# Patient Record
Sex: Female | Born: 1955
Health system: Southern US, Community
[De-identification: ages and names within clinical notes are randomized; demographics above are authoritative.]

## PROBLEM LIST (undated history)

## (undated) DIAGNOSIS — M069 Rheumatoid arthritis, unspecified: Secondary | ICD-10-CM

## (undated) DIAGNOSIS — I480 Paroxysmal atrial fibrillation: Secondary | ICD-10-CM

## (undated) DIAGNOSIS — Z8489 Family history of other specified conditions: Secondary | ICD-10-CM

## (undated) DIAGNOSIS — D573 Sickle-cell trait: Secondary | ICD-10-CM

## (undated) DIAGNOSIS — Z5189 Encounter for other specified aftercare: Secondary | ICD-10-CM

## (undated) DIAGNOSIS — M5126 Other intervertebral disc displacement, lumbar region: Secondary | ICD-10-CM

## (undated) DIAGNOSIS — I499 Cardiac arrhythmia, unspecified: Secondary | ICD-10-CM

## (undated) DIAGNOSIS — J020 Streptococcal pharyngitis: Secondary | ICD-10-CM

## (undated) DIAGNOSIS — I2699 Other pulmonary embolism without acute cor pulmonale: Secondary | ICD-10-CM

## (undated) DIAGNOSIS — I493 Ventricular premature depolarization: Secondary | ICD-10-CM

## (undated) DIAGNOSIS — R011 Cardiac murmur, unspecified: Secondary | ICD-10-CM

## (undated) DIAGNOSIS — I10 Essential (primary) hypertension: Secondary | ICD-10-CM

## (undated) DIAGNOSIS — N939 Abnormal uterine and vaginal bleeding, unspecified: Secondary | ICD-10-CM

## (undated) DIAGNOSIS — R3915 Urgency of urination: Secondary | ICD-10-CM

## (undated) DIAGNOSIS — C801 Malignant (primary) neoplasm, unspecified: Secondary | ICD-10-CM

## (undated) DIAGNOSIS — R112 Nausea with vomiting, unspecified: Secondary | ICD-10-CM

## (undated) DIAGNOSIS — J189 Pneumonia, unspecified organism: Secondary | ICD-10-CM

## (undated) DIAGNOSIS — R0981 Nasal congestion: Secondary | ICD-10-CM

## (undated) DIAGNOSIS — I82409 Acute embolism and thrombosis of unspecified deep veins of unspecified lower extremity: Secondary | ICD-10-CM

## (undated) DIAGNOSIS — R0789 Other chest pain: Secondary | ICD-10-CM

## (undated) DIAGNOSIS — Z9889 Other specified postprocedural states: Secondary | ICD-10-CM

## (undated) DIAGNOSIS — G629 Polyneuropathy, unspecified: Secondary | ICD-10-CM

## (undated) HISTORY — DX: Polyneuropathy, unspecified: G62.9

## (undated) HISTORY — DX: Rheumatoid arthritis, unspecified: M06.9

## (undated) HISTORY — DX: Streptococcal pharyngitis: J02.0

## (undated) HISTORY — DX: Ventricular premature depolarization: I49.3

## (undated) HISTORY — PX: TONSILLECTOMY: SUR1361

## (undated) HISTORY — DX: Acute embolism and thrombosis of unspecified deep veins of unspecified lower extremity: I82.409

## (undated) HISTORY — DX: Other pulmonary embolism without acute cor pulmonale: I26.99

## (undated) HISTORY — PX: VASCULAR SURGERY: SHX849

## (undated) HISTORY — DX: Cardiac murmur, unspecified: R01.1

## (undated) HISTORY — DX: Encounter for other specified aftercare: Z51.89

## (undated) HISTORY — PX: MANDIBLE SURGERY: SHX707

## (undated) HISTORY — DX: Other chest pain: R07.89

## (undated) HISTORY — DX: Paroxysmal atrial fibrillation: I48.0

## (undated) HISTORY — PX: COLONOSCOPY W/ POLYPECTOMY: SHX1380

## (undated) HISTORY — DX: Essential (primary) hypertension: I10

## (undated) HISTORY — PX: SPINE SURGERY: SHX786

## (undated) HISTORY — DX: Abnormal uterine and vaginal bleeding, unspecified: N93.9

---

## 1998-04-25 ENCOUNTER — Ambulatory Visit (HOSPITAL_COMMUNITY): Admission: RE | Admit: 1998-04-25 | Discharge: 1998-04-25 | Payer: Self-pay | Admitting: *Deleted

## 1999-05-20 ENCOUNTER — Encounter: Payer: Self-pay | Admitting: *Deleted

## 1999-05-20 ENCOUNTER — Ambulatory Visit (HOSPITAL_COMMUNITY): Admission: RE | Admit: 1999-05-20 | Discharge: 1999-05-20 | Payer: Self-pay | Admitting: Neurosurgery

## 1999-05-20 ENCOUNTER — Encounter: Payer: Self-pay | Admitting: Neurosurgery

## 1999-09-21 ENCOUNTER — Inpatient Hospital Stay (HOSPITAL_COMMUNITY): Admission: EM | Admit: 1999-09-21 | Discharge: 1999-09-22 | Payer: Self-pay | Admitting: Emergency Medicine

## 1999-09-22 ENCOUNTER — Encounter: Payer: Self-pay | Admitting: Internal Medicine

## 1999-12-28 ENCOUNTER — Ambulatory Visit (HOSPITAL_COMMUNITY): Admission: AD | Admit: 1999-12-28 | Discharge: 1999-12-28 | Payer: Self-pay | Admitting: Neurosurgery

## 2000-05-06 ENCOUNTER — Ambulatory Visit (HOSPITAL_COMMUNITY): Admission: RE | Admit: 2000-05-06 | Discharge: 2000-05-06 | Payer: Self-pay | Admitting: Obstetrics

## 2000-05-06 ENCOUNTER — Encounter: Payer: Self-pay | Admitting: Obstetrics

## 2000-05-21 ENCOUNTER — Inpatient Hospital Stay (HOSPITAL_COMMUNITY): Admission: EM | Admit: 2000-05-21 | Discharge: 2000-05-23 | Payer: Self-pay | Admitting: Emergency Medicine

## 2000-11-11 ENCOUNTER — Encounter: Payer: Self-pay | Admitting: *Deleted

## 2000-11-11 ENCOUNTER — Ambulatory Visit (HOSPITAL_COMMUNITY): Admission: RE | Admit: 2000-11-11 | Discharge: 2000-11-11 | Payer: Self-pay | Admitting: *Deleted

## 2001-02-27 ENCOUNTER — Encounter: Admission: RE | Admit: 2001-02-27 | Discharge: 2001-02-27 | Payer: Self-pay | Admitting: *Deleted

## 2001-11-09 ENCOUNTER — Encounter: Payer: Self-pay | Admitting: *Deleted

## 2001-11-09 ENCOUNTER — Ambulatory Visit (HOSPITAL_COMMUNITY): Admission: RE | Admit: 2001-11-09 | Discharge: 2001-11-09 | Payer: Self-pay | Admitting: *Deleted

## 2001-12-12 ENCOUNTER — Ambulatory Visit (HOSPITAL_COMMUNITY): Admission: RE | Admit: 2001-12-12 | Discharge: 2001-12-12 | Payer: Self-pay | Admitting: *Deleted

## 2001-12-12 ENCOUNTER — Encounter (HOSPITAL_COMMUNITY): Admission: RE | Admit: 2001-12-12 | Discharge: 2002-03-12 | Payer: Self-pay | Admitting: *Deleted

## 2002-12-19 ENCOUNTER — Encounter: Payer: Self-pay | Admitting: Obstetrics

## 2002-12-19 ENCOUNTER — Ambulatory Visit (HOSPITAL_COMMUNITY): Admission: RE | Admit: 2002-12-19 | Discharge: 2002-12-19 | Payer: Self-pay | Admitting: Obstetrics

## 2003-06-19 ENCOUNTER — Encounter: Admission: RE | Admit: 2003-06-19 | Discharge: 2003-06-19 | Payer: Self-pay | Admitting: Occupational Medicine

## 2003-06-19 ENCOUNTER — Encounter: Payer: Self-pay | Admitting: Occupational Medicine

## 2004-03-17 ENCOUNTER — Encounter (INDEPENDENT_AMBULATORY_CARE_PROVIDER_SITE_OTHER): Payer: Self-pay | Admitting: Specialist

## 2004-03-17 ENCOUNTER — Ambulatory Visit (HOSPITAL_COMMUNITY): Admission: RE | Admit: 2004-03-17 | Discharge: 2004-03-17 | Payer: Self-pay | Admitting: Obstetrics

## 2004-03-17 HISTORY — PX: OTHER SURGICAL HISTORY: SHX169

## 2004-09-12 ENCOUNTER — Inpatient Hospital Stay (HOSPITAL_COMMUNITY): Admission: AD | Admit: 2004-09-12 | Discharge: 2004-09-12 | Payer: Self-pay | Admitting: Obstetrics

## 2005-01-23 ENCOUNTER — Emergency Department (HOSPITAL_COMMUNITY): Admission: EM | Admit: 2005-01-23 | Discharge: 2005-01-23 | Payer: Self-pay | Admitting: Emergency Medicine

## 2005-03-08 ENCOUNTER — Ambulatory Visit (HOSPITAL_COMMUNITY): Admission: RE | Admit: 2005-03-08 | Discharge: 2005-03-08 | Payer: Self-pay | Admitting: Obstetrics

## 2005-06-28 ENCOUNTER — Ambulatory Visit (HOSPITAL_COMMUNITY): Admission: RE | Admit: 2005-06-28 | Discharge: 2005-06-28 | Payer: Self-pay | Admitting: Obstetrics

## 2005-07-13 ENCOUNTER — Encounter: Admission: RE | Admit: 2005-07-13 | Discharge: 2005-07-13 | Payer: Self-pay | Admitting: Obstetrics

## 2005-07-26 ENCOUNTER — Encounter: Admission: RE | Admit: 2005-07-26 | Discharge: 2005-07-26 | Payer: Self-pay | Admitting: Interventional Radiology

## 2005-08-11 ENCOUNTER — Inpatient Hospital Stay (HOSPITAL_COMMUNITY): Admission: EM | Admit: 2005-08-11 | Discharge: 2005-08-12 | Payer: Self-pay | Admitting: Interventional Radiology

## 2005-08-26 ENCOUNTER — Ambulatory Visit (HOSPITAL_COMMUNITY): Admission: RE | Admit: 2005-08-26 | Discharge: 2005-08-26 | Payer: Self-pay | Admitting: Interventional Radiology

## 2005-08-27 ENCOUNTER — Encounter: Admission: RE | Admit: 2005-08-27 | Discharge: 2005-08-27 | Payer: Self-pay | Admitting: Interventional Radiology

## 2005-08-27 ENCOUNTER — Observation Stay (HOSPITAL_COMMUNITY): Admission: AD | Admit: 2005-08-27 | Discharge: 2005-08-28 | Payer: Self-pay | Admitting: Interventional Radiology

## 2006-05-17 ENCOUNTER — Ambulatory Visit (HOSPITAL_COMMUNITY): Admission: RE | Admit: 2006-05-17 | Discharge: 2006-05-17 | Payer: Self-pay | Admitting: Obstetrics

## 2006-07-25 ENCOUNTER — Emergency Department (HOSPITAL_COMMUNITY): Admission: EM | Admit: 2006-07-25 | Discharge: 2006-07-25 | Payer: Self-pay | Admitting: Emergency Medicine

## 2006-08-30 DIAGNOSIS — N939 Abnormal uterine and vaginal bleeding, unspecified: Secondary | ICD-10-CM

## 2006-08-30 HISTORY — DX: Abnormal uterine and vaginal bleeding, unspecified: N93.9

## 2006-08-30 HISTORY — PX: UTERINE ARTERY EMBOLIZATION: SHX2629

## 2006-11-07 ENCOUNTER — Encounter: Admission: RE | Admit: 2006-11-07 | Discharge: 2006-11-07 | Payer: Self-pay | Admitting: Obstetrics

## 2007-04-01 ENCOUNTER — Emergency Department (HOSPITAL_COMMUNITY): Admission: EM | Admit: 2007-04-01 | Discharge: 2007-04-01 | Payer: Self-pay | Admitting: Family Medicine

## 2007-05-30 ENCOUNTER — Ambulatory Visit (HOSPITAL_COMMUNITY): Admission: RE | Admit: 2007-05-30 | Discharge: 2007-05-30 | Payer: Self-pay | Admitting: Obstetrics

## 2007-10-25 ENCOUNTER — Emergency Department (HOSPITAL_COMMUNITY): Admission: EM | Admit: 2007-10-25 | Discharge: 2007-10-25 | Payer: Self-pay | Admitting: Family Medicine

## 2008-07-02 ENCOUNTER — Emergency Department (HOSPITAL_COMMUNITY): Admission: EM | Admit: 2008-07-02 | Discharge: 2008-07-02 | Payer: Self-pay | Admitting: Family Medicine

## 2008-08-30 HISTORY — PX: OTHER SURGICAL HISTORY: SHX169

## 2008-09-02 ENCOUNTER — Encounter: Payer: Self-pay | Admitting: Internal Medicine

## 2008-09-13 ENCOUNTER — Ambulatory Visit: Payer: Self-pay | Admitting: Internal Medicine

## 2008-10-28 ENCOUNTER — Ambulatory Visit: Payer: Self-pay | Admitting: Internal Medicine

## 2008-10-28 LAB — CONVERTED CEMR LAB
BUN: 17 mg/dL (ref 6–23)
Basophils Absolute: 0 10*3/uL (ref 0.0–0.1)
Basophils Relative: 0.1 % (ref 0.0–3.0)
CO2: 30 meq/L (ref 19–32)
Calcium: 9.2 mg/dL (ref 8.4–10.5)
Chloride: 105 meq/L (ref 96–112)
Creatinine, Ser: 0.9 mg/dL (ref 0.4–1.2)
Eosinophils Absolute: 0.2 10*3/uL (ref 0.0–0.7)
Eosinophils Relative: 3.2 % (ref 0.0–5.0)
GFR calc Af Amer: 85 mL/min
GFR calc non Af Amer: 70 mL/min
Glucose, Bld: 99 mg/dL (ref 70–99)
HCT: 37 % (ref 36.0–46.0)
Hemoglobin: 12.2 g/dL (ref 12.0–15.0)
INR: 3.6 — ABNORMAL HIGH (ref 0.8–1.0)
Lymphocytes Relative: 20.4 % (ref 12.0–46.0)
MCHC: 33.1 g/dL (ref 30.0–36.0)
MCV: 81.8 fL (ref 78.0–100.0)
Monocytes Absolute: 0.7 10*3/uL (ref 0.1–1.0)
Monocytes Relative: 9 % (ref 3.0–12.0)
Neutro Abs: 4.9 10*3/uL (ref 1.4–7.7)
Neutrophils Relative %: 67.3 % (ref 43.0–77.0)
Platelets: 213 10*3/uL (ref 150–400)
Potassium: 4.4 meq/L (ref 3.5–5.1)
Prothrombin Time: 36.2 s — ABNORMAL HIGH (ref 10.9–13.3)
RBC: 4.52 M/uL (ref 3.87–5.11)
RDW: 13.3 % (ref 11.5–14.6)
Sodium: 141 meq/L (ref 135–145)
WBC: 7.3 10*3/uL (ref 4.5–10.5)
aPTT: 47 s — ABNORMAL HIGH (ref 21.7–29.8)

## 2008-11-01 ENCOUNTER — Ambulatory Visit: Payer: Self-pay | Admitting: Internal Medicine

## 2008-11-01 LAB — CONVERTED CEMR LAB
INR: 2.1 — ABNORMAL HIGH (ref 0.8–1.0)
Prothrombin Time: 21.4 s — ABNORMAL HIGH (ref 10.9–13.3)

## 2008-11-04 ENCOUNTER — Ambulatory Visit: Payer: Self-pay | Admitting: Internal Medicine

## 2008-11-04 ENCOUNTER — Ambulatory Visit (HOSPITAL_COMMUNITY): Admission: RE | Admit: 2008-11-04 | Discharge: 2008-11-05 | Payer: Self-pay | Admitting: Internal Medicine

## 2008-12-05 DIAGNOSIS — I2782 Chronic pulmonary embolism: Secondary | ICD-10-CM | POA: Insufficient documentation

## 2008-12-05 DIAGNOSIS — I82409 Acute embolism and thrombosis of unspecified deep veins of unspecified lower extremity: Secondary | ICD-10-CM | POA: Insufficient documentation

## 2008-12-05 DIAGNOSIS — I4891 Unspecified atrial fibrillation: Secondary | ICD-10-CM | POA: Insufficient documentation

## 2008-12-05 DIAGNOSIS — I493 Ventricular premature depolarization: Secondary | ICD-10-CM | POA: Insufficient documentation

## 2008-12-05 DIAGNOSIS — J020 Streptococcal pharyngitis: Secondary | ICD-10-CM | POA: Insufficient documentation

## 2008-12-09 ENCOUNTER — Ambulatory Visit: Payer: Self-pay | Admitting: Internal Medicine

## 2008-12-09 ENCOUNTER — Encounter: Payer: Self-pay | Admitting: Internal Medicine

## 2009-04-03 ENCOUNTER — Encounter: Admission: RE | Admit: 2009-04-03 | Discharge: 2009-04-03 | Payer: Self-pay | Admitting: Internal Medicine

## 2009-05-12 ENCOUNTER — Ambulatory Visit (HOSPITAL_COMMUNITY): Admission: RE | Admit: 2009-05-12 | Discharge: 2009-05-12 | Payer: Self-pay | Admitting: Internal Medicine

## 2009-05-13 ENCOUNTER — Encounter (INDEPENDENT_AMBULATORY_CARE_PROVIDER_SITE_OTHER): Payer: Self-pay | Admitting: *Deleted

## 2009-06-11 ENCOUNTER — Encounter: Admission: RE | Admit: 2009-06-11 | Discharge: 2009-06-11 | Payer: Self-pay | Admitting: Neurosurgery

## 2009-06-25 ENCOUNTER — Encounter: Admission: RE | Admit: 2009-06-25 | Discharge: 2009-06-25 | Payer: Self-pay | Admitting: Neurosurgery

## 2009-06-25 ENCOUNTER — Encounter: Payer: Self-pay | Admitting: Internal Medicine

## 2009-06-26 ENCOUNTER — Telehealth (INDEPENDENT_AMBULATORY_CARE_PROVIDER_SITE_OTHER): Payer: Self-pay | Admitting: *Deleted

## 2009-06-26 ENCOUNTER — Ambulatory Visit: Payer: Self-pay | Admitting: Internal Medicine

## 2009-10-06 ENCOUNTER — Ambulatory Visit (HOSPITAL_COMMUNITY): Admission: RE | Admit: 2009-10-06 | Discharge: 2009-10-06 | Payer: Self-pay | Admitting: Obstetrics

## 2009-10-21 ENCOUNTER — Emergency Department (HOSPITAL_COMMUNITY): Admission: EM | Admit: 2009-10-21 | Discharge: 2009-10-21 | Payer: Self-pay | Admitting: Emergency Medicine

## 2009-10-24 ENCOUNTER — Encounter: Admission: RE | Admit: 2009-10-24 | Discharge: 2009-10-24 | Payer: Self-pay | Admitting: Neurosurgery

## 2009-11-21 ENCOUNTER — Telehealth: Payer: Self-pay | Admitting: Internal Medicine

## 2009-11-26 ENCOUNTER — Encounter (INDEPENDENT_AMBULATORY_CARE_PROVIDER_SITE_OTHER): Payer: Self-pay | Admitting: Cardiology

## 2009-11-26 ENCOUNTER — Ambulatory Visit: Payer: Self-pay | Admitting: Cardiovascular Disease

## 2009-11-26 LAB — CONVERTED CEMR LAB: POC INR: 1.9

## 2010-01-14 ENCOUNTER — Ambulatory Visit: Payer: Self-pay | Admitting: Cardiology

## 2010-01-14 LAB — CONVERTED CEMR LAB: POC INR: 4.5

## 2010-01-21 ENCOUNTER — Ambulatory Visit: Payer: Self-pay | Admitting: Internal Medicine

## 2010-02-09 ENCOUNTER — Ambulatory Visit: Payer: Self-pay | Admitting: Cardiology

## 2010-02-09 LAB — CONVERTED CEMR LAB: POC INR: 4.4

## 2010-02-23 ENCOUNTER — Ambulatory Visit: Payer: Self-pay | Admitting: Cardiology

## 2010-02-23 LAB — CONVERTED CEMR LAB: POC INR: 2.4

## 2010-03-09 ENCOUNTER — Ambulatory Visit: Payer: Self-pay | Admitting: Internal Medicine

## 2010-03-09 LAB — CONVERTED CEMR LAB: POC INR: 2.6

## 2010-03-11 ENCOUNTER — Ambulatory Visit (HOSPITAL_COMMUNITY): Admission: RE | Admit: 2010-03-11 | Discharge: 2010-03-11 | Payer: Self-pay | Admitting: Internal Medicine

## 2010-03-11 ENCOUNTER — Ambulatory Visit: Payer: Self-pay

## 2010-03-11 ENCOUNTER — Ambulatory Visit: Payer: Self-pay | Admitting: Cardiology

## 2010-03-11 ENCOUNTER — Encounter: Payer: Self-pay | Admitting: Internal Medicine

## 2010-03-20 ENCOUNTER — Telehealth: Payer: Self-pay | Admitting: Internal Medicine

## 2010-03-23 ENCOUNTER — Ambulatory Visit: Payer: Self-pay | Admitting: Cardiology

## 2010-03-23 LAB — CONVERTED CEMR LAB: POC INR: 3.4

## 2010-03-30 ENCOUNTER — Ambulatory Visit: Payer: Self-pay | Admitting: Cardiovascular Disease

## 2010-03-30 LAB — CONVERTED CEMR LAB: POC INR: 2.7

## 2010-04-20 ENCOUNTER — Ambulatory Visit: Payer: Self-pay | Admitting: Cardiology

## 2010-04-20 LAB — CONVERTED CEMR LAB: POC INR: 3.7

## 2010-05-11 ENCOUNTER — Ambulatory Visit: Payer: Self-pay | Admitting: Internal Medicine

## 2010-05-11 LAB — CONVERTED CEMR LAB: POC INR: 2.3

## 2010-06-10 ENCOUNTER — Ambulatory Visit: Payer: Self-pay | Admitting: Cardiovascular Disease

## 2010-06-10 LAB — CONVERTED CEMR LAB: POC INR: 2.5

## 2010-07-08 ENCOUNTER — Ambulatory Visit: Payer: Self-pay | Admitting: Cardiovascular Disease

## 2010-07-08 LAB — CONVERTED CEMR LAB: POC INR: 3

## 2010-07-13 ENCOUNTER — Ambulatory Visit (HOSPITAL_COMMUNITY): Admission: RE | Admit: 2010-07-13 | Discharge: 2010-07-13 | Payer: Self-pay | Admitting: Neurosurgery

## 2010-08-03 ENCOUNTER — Ambulatory Visit: Payer: Self-pay | Admitting: Internal Medicine

## 2010-08-03 LAB — CONVERTED CEMR LAB: POC INR: 2.4

## 2010-08-06 ENCOUNTER — Telehealth: Payer: Self-pay | Admitting: Internal Medicine

## 2010-08-07 ENCOUNTER — Encounter
Admission: RE | Admit: 2010-08-07 | Discharge: 2010-08-07 | Payer: Self-pay | Source: Home / Self Care | Attending: Neurosurgery | Admitting: Neurosurgery

## 2010-08-07 ENCOUNTER — Ambulatory Visit: Payer: Self-pay | Admitting: Internal Medicine

## 2010-08-07 LAB — CONVERTED CEMR LAB: POC INR: 1.2

## 2010-08-17 ENCOUNTER — Ambulatory Visit: Payer: Self-pay | Admitting: Internal Medicine

## 2010-08-17 LAB — CONVERTED CEMR LAB: POC INR: 2.3

## 2010-09-14 ENCOUNTER — Ambulatory Visit: Admission: RE | Admit: 2010-09-14 | Discharge: 2010-09-14 | Payer: Self-pay | Source: Home / Self Care

## 2010-09-14 LAB — CONVERTED CEMR LAB: POC INR: 3.8

## 2010-09-19 ENCOUNTER — Encounter: Payer: Self-pay | Admitting: Interventional Radiology

## 2010-09-19 ENCOUNTER — Encounter: Payer: Self-pay | Admitting: Neurosurgery

## 2010-09-28 ENCOUNTER — Ambulatory Visit: Admission: RE | Admit: 2010-09-28 | Discharge: 2010-09-28 | Payer: Self-pay | Source: Home / Self Care

## 2010-09-28 LAB — CONVERTED CEMR LAB: POC INR: 2.8

## 2010-09-29 NOTE — Letter (Signed)
Summary: Handout Printed  Printed Handout:  - Coumadin Instructions-w/out Meds 

## 2010-09-29 NOTE — Medication Information (Signed)
Summary: rov/cb  Anticoagulant Therapy  Managed by: Bethena Midget, RN, BSN Referring MD: Hillis Range, MD PCP: Dr. Kellie Shropshire Supervising MD: Tenny Craw MD, Gunnar Fusi Indication 1: Atrial Fibrillation Lab Used: [facility] [place] Site: [address] INR POC 2.6 INR RANGE 2 - 3  Dietary changes: no    Health status changes: no    Bleeding/hemorrhagic complications: yes       Details: bruise on arm  Recent/future hospitalizations: no    Any changes in medication regimen? no    Recent/future dental: no  Any missed doses?: no       Is patient compliant with meds? yes       Allergies: 1)  ! Medrol (Methylprednisolone) 2)  ! Sulfa 3)  ! Codeine  Anticoagulation Management History:      The patient is taking warfarin and comes in today for a routine follow up visit.  Negative risk factors for bleeding include an age less than 35 years old.  The bleeding index is 'low risk'.  Negative CHADS2 values include Age > 18 years old.  Her last INR was 2.1 RATIO.  Anticoagulation responsible provider: Tenny Craw MD, Gunnar Fusi.  INR POC: 2.6.  Cuvette Lot#: [id number].  Exp: 03/2011.    Anticoagulation Management Assessment/Plan:      The patient's current anticoagulation dose is Warfarin sodium 5 mg tabs: Use as directed by anticoagulation clinic.  The target INR is 2.0-3.0.  The next INR is due 04/06/2010.  Anticoagulation instructions were given to patient.  Results were reviewed/authorized by Bethena Midget, RN, BSN.  She was notified by Dillard Cannon.         Prior Anticoagulation Instructions: INR 2.4. Take 1.5 tablets daily except 1 tablet on Tues, Thurs, Sat. Recheck in 2 weeks.  Current Anticoagulation Instructions: INR 2.6  Continue same dose of 1 tab on Tuesday, Thursday, and Saturday and 1.5 tabs all other days.  Re-check INR in 4 week.

## 2010-09-29 NOTE — Medication Information (Signed)
Summary: rov/sl  Anticoagulant Therapy  Managed by: Bethena Midget, RN, BSN Referring MD: Hillis Range, MD PCP: Dr. Kellie Shropshire Supervising MD: Tenny Craw MD, Gunnar Fusi Indication 1: Atrial Fibrillation Lab Used: [facility] [place] Site: [address] INR POC 2.4 INR RANGE 2 - 3  Dietary changes: no    Health status changes: no    Bleeding/hemorrhagic complications: no    Recent/future hospitalizations: no    Any changes in medication regimen? no    Recent/future dental: no  Any missed doses?: no       Is patient compliant with meds? yes      Comments: On Friday having Lumbar Spinal Steroid injection has to be off for 4 day.   Allergies: 1)  ! Medrol (Methylprednisolone) 2)  ! Sulfa 3)  ! Codeine  Anticoagulation Management History:      The patient is taking warfarin and comes in today for a routine follow up visit.  Negative risk factors for bleeding include an age less than 43 years old.  The bleeding index is 'low risk'.  Negative CHADS2 values include Age > 56 years old.  Her last INR was 2.1 RATIO.  Anticoagulation responsible provider: Tenny Craw MD, Gunnar Fusi.  INR POC: 2.4.  Cuvette Lot#: [id number].  Exp: 07/2011.    Anticoagulation Management Assessment/Plan:      The patient's current anticoagulation dose is Warfarin sodium 5 mg tabs: Use as directed by anticoagulation clinic.  The target INR is 2.0-3.0.  The next INR is due 08/17/2010.  Anticoagulation instructions were given to patient.  Results were reviewed/authorized by Bethena Midget, RN, BSN.  She was notified by Bethena Midget, RN, BSN.         Prior Anticoagulation Instructions: INR 3.0  Continue taking Coumadin 1 tab (5 mg) on Sun, Tues, Thur, Sat and Coumadin 1.5 tabs (7.5 mg) on Mon, Wed, Fri. Return to clinic 4 weeks.   Current Anticoagulation Instructions: INR 2.4 Hold coumadin until we call with clearance for procedure on Friday. Restart post procedure per MD doing injection. Recheck INR in 7-10 days.

## 2010-09-29 NOTE — Progress Notes (Signed)
Summary: NEED ORDER FOR PT INR TO BE CHECKED  Phone Note Call from Patient Call back at Home Phone [phone] Call back at [phone]   Caller: Patient Summary of Call: PT CALLING REGARDING GETTING A ORDER FOR PT INR TO BE CHECKED. Initial call taken by: Judie Grieve,  November 21, 2009 11:00 AM  Follow-up for Phone Call        spoke with pt, will need to be set up in our CV[facility] clinic ASAP. She is an old pt of Dr Elsie Lincoln.  I explained to her that this is how our office follows Coumadin and someone would be calling her from the clinic to set her up.  She had a spinal injection 3 weeks ago held coumadin and then restarted on same dose.  Has not had it checked since.  She is a Engineer, civil (consulting). Dennis Bast, RN, BSN  November 21, 2009 11:48 AM  Additional Follow-up for Phone Call Additional follow up Details #1::        Spoke with pt. and scheduled appt. for Mondays, 28th at 830am Additional Follow-up by: Bethena Midget, RN, BSN,  November 21, 2009 12:50 PM

## 2010-09-29 NOTE — Medication Information (Signed)
Summary: ccr  Anticoagulant Therapy  Managed by: Weston Brass, PharmD Referring MD: Hillis Range, MD PCP: Dr. Kellie Shropshire Supervising MD: Antoine Poche MD, Fayrene Fearing Indication 1: Atrial Fibrillation Lab Used: [facility] [place] Site: [address] INR POC 4.4 INR RANGE 2 - 3  Dietary changes: no    Health status changes: no    Bleeding/hemorrhagic complications: no    Recent/future hospitalizations: no    Any changes in medication regimen? no    Recent/future dental: no  Any missed doses?: no       Is patient compliant with meds? yes       Allergies: 1)  ! Medrol (Methylprednisolone) 2)  ! Sulfa 3)  ! Codeine  Anticoagulation Management History:      The patient is taking warfarin and comes in today for a routine follow up visit.  Negative risk factors for bleeding include an age less than 1 years old.  The bleeding index is 'low risk'.  Negative CHADS2 values include Age > 54 years old.  Her last INR was 2.1 RATIO.  Anticoagulation responsible provider: Antoine Poche MD, Fayrene Fearing.  INR POC: 4.4.  Cuvette Lot#: [id number].  Exp: 03/2011.    Anticoagulation Management Assessment/Plan:      The patient's current anticoagulation dose is Warfarin sodium 5 mg tabs: Use as directed by anticoagulation clinic.  The target INR is 2.0-3.0.  The next INR is due 02/23/2010.  Anticoagulation instructions were given to patient.  Results were reviewed/authorized by Weston Brass, PharmD.  She was notified by Weston Brass PharmD.         Prior Anticoagulation Instructions: INR 4.5 Skip today's dose and on Thursday take only 5mg   then resume 7.5mg  daily except 5mg  on Mondays. Recheck in 2 weeks.   Current Anticoagulation Instructions: INR 4.4  Skip today's dose then decrease dose to 7.5mg  daily except 5mg  on Tuesday, Thursday and Saturday

## 2010-09-29 NOTE — Progress Notes (Signed)
  Patient signed Release today after seeing Dr.Allred, I faxed Release over to [facility] [phone] Attn : Medical Records  [facility]  June 26, 2009 1:28 PM    Appended Document:  Recieved REcords back today from [facility] office/[facility] will forward Records to [facility]

## 2010-09-29 NOTE — Medication Information (Signed)
Summary: rov/sp  Anticoagulant Therapy  Managed by: Elaina Pattee, PharmD Referring MD: Hillis Range, MD PCP: Dr. Kellie Shropshire Supervising MD: Shirlee Latch MD, Dalton Indication 1: Atrial Fibrillation Lab Used: [facility] [place] Site: [address] INR POC 2.4 INR RANGE 2 - 3  Dietary changes: no    Health status changes: yes       Details: Feels her heart "flippy flop" when she gets tired.  Bleeding/hemorrhagic complications: no    Recent/future hospitalizations: no    Any changes in medication regimen? no    Recent/future dental: no  Any missed doses?: yes     Details: May have missed 1 dose this past Saturday.  Is patient compliant with meds? yes       Allergies: 1)  ! Medrol (Methylprednisolone) 2)  ! Sulfa 3)  ! Codeine  Anticoagulation Management History:      The patient is taking warfarin and comes in today for a routine follow up visit.  Negative risk factors for bleeding include an age less than 67 years old.  The bleeding index is 'low risk'.  Negative CHADS2 values include Age > 42 years old.  Her last INR was 2.1 RATIO.  Anticoagulation responsible provider: Shirlee Latch MD, Dalton.  INR POC: 2.4.  Cuvette Lot#: [id number].  Exp: 03/2011.    Anticoagulation Management Assessment/Plan:      The patient's current anticoagulation dose is Warfarin sodium 5 mg tabs: Use as directed by anticoagulation clinic.  The target INR is 2.0-3.0.  The next INR is due 03/09/2010.  Anticoagulation instructions were given to patient.  Results were reviewed/authorized by Elaina Pattee, PharmD.  She was notified by Elaina Pattee, PharmD.         Prior Anticoagulation Instructions: INR 4.4  Skip today's dose then decrease dose to 7.5mg  daily except 5mg  on Tuesday, Thursday and Saturday  Current Anticoagulation Instructions: INR 2.4. Take 1.5 tablets daily except 1 tablet on Tues, Thurs, Sat. Recheck in 2 weeks.

## 2010-09-29 NOTE — Medication Information (Signed)
Summary: rov/tm  Anticoagulant Therapy  Managed by: Cloyde Reams, RN, BSN Referring MD: Hillis Range, MD PCP: Dr. Kellie Shropshire Supervising MD: Eden Emms MD, Theron Arista Indication 1: Atrial Fibrillation Lab Used: [facility] [place] Site: [address] INR POC 2.7 INR RANGE 2 - 3  Dietary changes: no    Health status changes: no    Bleeding/hemorrhagic complications: no    Recent/future hospitalizations: no    Any changes in medication regimen? yes       Details: Completed Bactrim on 03/27/10  Recent/future dental: no  Any missed doses?: no       Is patient compliant with meds? yes       Allergies: 1)  ! Medrol (Methylprednisolone) 2)  ! Sulfa 3)  ! Codeine  Anticoagulation Management History:      The patient is taking warfarin and comes in today for a routine follow up visit.  Negative risk factors for bleeding include an age less than 41 years old.  The bleeding index is 'low risk'.  Negative CHADS2 values include Age > 1 years old.  Her last INR was 2.1 RATIO.  Anticoagulation responsible provider: Eden Emms MD, Theron Arista.  INR POC: 2.7.  Cuvette Lot#: [id number].  Exp: 05/2011.    Anticoagulation Management Assessment/Plan:      The patient's current anticoagulation dose is Warfarin sodium 5 mg tabs: Use as directed by anticoagulation clinic.  The target INR is 2.0-3.0.  The next INR is due 04/20/2010.  Anticoagulation instructions were given to patient.  Results were reviewed/authorized by Cloyde Reams, RN, BSN.  She was notified by Cloyde Reams RN.         Prior Anticoagulation Instructions: INR 3.4 Skip today's dose then change dose to 5mg s daily this week while on Septra and Sunday.  Recheck INR on Monday.;   Current Anticoagulation Instructions: INR 2.7  Completed Bactrim on 03/27/10.  Resume same dosage 1.5 tablets daily except 1 tablet on Tuesdays, Thursdays, and Saturdays.  Recheck in 3 weeks.

## 2010-09-29 NOTE — Assessment & Plan Note (Signed)
Summary: PER CHECK OUT/SF   Visit Type:  Follow-up Referring Provider:  Chanda Busing, MD Primary Provider:  Dr. Kellie Shropshire   History of Present Illness: The patient presents today for followup of her symptomatically ventricular contractions. She was previously evaluated by me and underwent PVC ablation in March of 2010. Prior to her ablation she was having PVCs in a bigeminal pattern on a daily basis.  She has done well since that time.  She reports only very rare PVCs. These occur during periods of "stress" or lack of sleep.   She denies chest pain, shortness of breath, symptoms of afib, presyncope, syncope, or other concerns.  Current Medications (verified): 1)  Warfarin Sodium 5 Mg Tabs (Warfarin Sodium) .... Use As Directed By Anticoagulation Clinic 2)  Nadolol 20 Mg Tabs (Nadolol) .... Take One Tablet Daily  Allergies (verified): 1)  ! Medrol (Methylprednisolone) 2)  ! Sulfa 3)  ! Codeine  Past History:  Past Medical History: Reviewed history from 06/26/2009 and no changes required. 1. Symptomatic premature ventricular contractions s/p ablation 2. Remote paroxysmal atrial fibrillation. 3. Pulmonary embolus previously. 4. Recurrent deep venous thrombosis, chronically anticoagulated with Coumadin. 5. The patient reports a severe strep throat infection at age 60 and     is not clear as to whether or not she may have had rheumatic fever. 6. Uterine artery embolization in 2008 for uterine bleeding.   Past Surgical History: Reviewed history from 06/26/2009 and no changes required.  Uterine artery embolization in 2008 for uterine bleeding  Diagnostic D&C hysteroscopy and Novasure ablation. 03/17/2004  PVC ablation 2010  Social History: Reviewed history from 06/26/2009 and no changes required.  The patient lives in [place] with her son and   brother.  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  She denies drug use.       Vital Signs:  Patient profile:   55 year old female Height:      69 inches Weight:      228 pounds BMI:     33.79 Pulse rate:   83 / minute BP sitting:   128 / 86  (left arm)  Vitals Entered By: Laurance Flatten CMA (Jan 21, 2010 9:55 AM)  Physical Exam  General:  Well developed, well nourished, in no acute distress. Head:  normocephalic and atraumatic Eyes:  PERRLA/EOM intact; conjunctiva and lids normal. Mouth:  Teeth, gums and palate normal. Oral mucosa normal. Neck:  Neck supple, no JVD. No masses, thyromegaly or abnormal cervical nodes. Lungs:  Clear bilaterally to auscultation and percussion. Heart:  Non-displaced PMI, chest non-tender; regular rate and rhythm, S1, S2 without murmurs, rubs or gallops. Carotid upstroke normal, no bruit. Normal abdominal aortic size, no bruits. Femorals normal pulses, no bruits. Pedals normal pulses. No edema, no varicosities. Abdomen:  Bowel sounds positive; abdomen soft and non-tender without masses, organomegaly, or hernias noted. No hepatosplenomegaly. Msk:  Back normal, normal gait. Muscle strength and tone normal. Pulses:  pulses normal in all 4 extremities Extremities:  No clubbing or cyanosis. Neurologic:  Alert and oriented x 3.   EKG  Procedure date:  01/21/2010  Findings:      sinus rhythm 83 bpm, otherwise normal ekg  Impression & Recommendations:  Problem # 1:  PREMATURE VENTRICULAR CONTRACTIONS (ICD-427.69) doing well s/p ablation, without sustained PVCs or NSVT.  Problem # 2:  ATRIAL FIBRILLATION (ICD-427.31) h/o afib we will obtain an echo to evaluate for any worsening structual heart  disease continue coumadin (goal INR 2-3) given h/o DVT and PTE, I prefer coumadin over pradaxa, however we could consider pradaxa down the road if further evidence in setting of PTE becomes available  Other Orders: Echocardiogram (Echo)  Patient Instructions: 1)  Your physician recommends that you schedule a follow-up appointment in:  12 months with Dr Johney Frame 2)  Your physician has requested that you have an echocardiogram.  Echocardiography is a painless test that uses sound waves to create images of your heart. It provides your doctor with information about the size and shape of your heart and how well your heart's chambers and valves are working.  This procedure takes approximately one hour. There are no restrictions for this procedure.

## 2010-09-29 NOTE — Assessment & Plan Note (Signed)
Summary: [id number]   Visit Type:  Follow-up Referring Provider:  Chanda Busing, MD Primary Provider:  Dr. Kellie Shropshire   History of Present Illness: The patient presents today for followup of her symptomatically ventricular contractions. She was previously evaluated by me and underwent PVC ablation in March of 2010. Prior to her ablation she was having PVCs in a bigeminal pattern on a daily basis. She's had a very significant improvement in her PVC burden. Now she reports having PVCs 1-2 times per wk, lasting about 15 minutes.  These occur during periods of "stress" or lack of sleep.   She denies chest pain, shortness of breath, symptoms of afib, presyncope, syncope, or other concerns.  Problems Prior to Update: 1)  ? of Rheumatic Fever  (ICD-390) 2)  Hx of Streptococcal Sore Throat  (ICD-034.0) 3)  Dvt  (ICD-453.40) 4)  Pulmonary Embolism  (ICD-415.19) 5)  Atrial Fibrillation  (ICD-427.31) 6)  Premature Ventricular Contractions  (ICD-427.69)  Current Medications (verified): 1)  Warfarin Sodium 5 Mg Tabs (Warfarin Sodium) .... 7.5 Everyday Except Monday 5 Mg. 2)  Bystolic 20 Mg Tabs (Nebivolol Hcl) .Marland Kitchen.. 1 and 1/2 Tablet Once Daily  Allergies: 1)  ! * Medrol 2)  ! Sulfa 3)  ! Codeine  Past History:  Past Medical History: 1. Symptomatic premature ventricular contractions s/p ablation 2. Remote paroxysmal atrial fibrillation. 3. Pulmonary embolus previously. 4. Recurrent deep venous thrombosis, chronically anticoagulated with Coumadin. 5. The patient reports a severe strep throat infection at age 2 and     is not clear as to whether or not she may have had rheumatic fever. 6. Uterine artery embolization in 2008 for uterine bleeding.   Past Surgical History:  Uterine artery embolization in 2008 for uterine bleeding  Diagnostic D&C hysteroscopy and Novasure ablation. 03/17/2004  PVC ablation 2010  Social History: Reviewed history from 12/05/2008 and no changes required.  The  patient lives in [place] with her son and   brother.  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  She denies drug use.      Review of Systems       All systems are reviewed and negative except as listed in the HPI.   Vital Signs:  Patient profile:   55 year old female Height:      69 inches Weight:      225 pounds BMI:     33.35 Pulse rate:   80 / minute BP sitting:   136 / 78  (left arm) Cuff size:   large  Vitals Entered By: Oswald Hillock (June 26, 2009 10:41 AM)  Physical Exam  General:  Well developed, well nourished, in no acute distress. Head:  normocephalic and atraumatic Eyes:  PERRLA/EOM intact; conjunctiva and lids normal. Nose:  no deformity, discharge, inflammation, or lesions Mouth:  Teeth, gums and palate normal. Oral mucosa normal. Neck:  Neck supple, no JVD. No masses, thyromegaly or abnormal cervical nodes. Lungs:  Clear bilaterally to auscultation and percussion. Heart:  Non-displaced PMI, chest non-tender; regular rate and rhythm, S1, S2 without murmurs, rubs or gallops. Carotid upstroke normal, no bruit. Normal abdominal aortic size, no bruits. Femorals normal pulses, no bruits. Pedals normal pulses. No edema, no varicosities. Abdomen:  Bowel sounds positive; abdomen soft and non-tender without masses, organomegaly, or hernias noted. No hepatosplenomegaly. Msk:  Back normal, normal gait. Muscle strength and tone normal. Pulses:  pulses normal in all 4 extremities Extremities:  No clubbing or  cyanosis. Neurologic:  Alert and oriented x 3. Skin:  Intact without lesions or rashes. Cervical Nodes:  no significant adenopathy Psych:  Normal affect.   Impression & Recommendations:  Problem # 1:  PREMATURE VENTRICULAR CONTRACTIONS (ICD-427.69) Much improved s/p ablation.  I have stopped bystolic today.  She will take Nadolol 20mg  daily.  Problem # 2:  ATRIAL FIBRILLATION (ICD-427.31) She is unaware of  sypmtoms of afib recently.  She is chronically anticoagulated with coumadin.    The following medications were removed from the medication list:    Bystolic 20 Mg Tabs (Nebivolol hcl) .Marland Kitchen... 1 and 1/2 tablet once daily Her updated medication list for this problem includes:    Warfarin Sodium 5 Mg Tabs (Warfarin sodium) .Marland Kitchen... 7.5 everyday except monday 5 mg.    Nadolol 20 Mg Tabs (Nadolol) .Marland Kitchen... Take one tablet daily  Patient Instructions: 1)  Your physician recommends that you schedule a follow-up appointment in: 6 months 2)  Fill out a medical release form before you leave today please 3)  Your physician has recommended you make the following change in your medication: STOP your Bystolic and replace with Nadolol 20mg  daily. Your prescription was printed and given to you today. Prescriptions: NADOLOL 20 MG TABS (NADOLOL) Take one tablet daily  #30 x 6   Entered by:   Duncan Dull, RN, BSN   Authorized by:   Hillis Range, MD   Signed by:   Duncan Dull, RN, BSN on 06/26/2009   Method used:   Print then Give to Patient   RxID:   [id number]

## 2010-09-29 NOTE — Medication Information (Signed)
Summary: rov/sp  Anticoagulant Therapy  Managed by: Bethena Midget, RN, BSN Referring MD: Hillis Range, MD PCP: Dr. Kellie Shropshire Supervising MD: Jens Som MD, Arlys John Indication 1: Atrial Fibrillation Lab Used: [facility] [place] Site: [address] INR POC 3.4 INR RANGE 2 - 3  Dietary changes: no    Health status changes: yes       Details: Treating Boil on Left upper thigh  Bleeding/hemorrhagic complications: no    Recent/future hospitalizations: no    Any changes in medication regimen? yes       Details: Started Septra on 03/19/10 for 7 days BID, forgot and unitl yesterday only took one daily  Recent/future dental: no  Any missed doses?: no       Is patient compliant with meds? yes       Allergies: 1)  ! Medrol (Methylprednisolone) 2)  ! Sulfa 3)  ! Codeine  Anticoagulation Management History:      The patient is taking warfarin and comes in today for a routine follow up visit.  Negative risk factors for bleeding include an age less than 41 years old.  The bleeding index is 'low risk'.  Negative CHADS2 values include Age > 15 years old.  Her last INR was 2.1 RATIO.  Anticoagulation responsible provider: Jens Som MD, Arlys John.  INR POC: 3.4.  Cuvette Lot#: [id number].  Exp: 05/2011.    Anticoagulation Management Assessment/Plan:      The patient's current anticoagulation dose is Warfarin sodium 5 mg tabs: Use as directed by anticoagulation clinic.  The target INR is 2.0-3.0.  The next INR is due 03/30/2010.  Anticoagulation instructions were given to patient.  Results were reviewed/authorized by Bethena Midget, RN, BSN.  She was notified by Bethena Midget, RN, BSN.         Prior Anticoagulation Instructions: INR 2.6  Continue same dose of 1 tab on Tuesday, Thursday, and Saturday and 1.5 tabs all other days.  Re-check INR in 4 week.  Current Anticoagulation Instructions: INR 3.4 Skip today's dose then change dose to 5mg s daily this week while on Septra and Sunday.   Recheck INR on Monday.;

## 2010-09-29 NOTE — Medication Information (Signed)
Summary: rov/cs  Anticoagulant Therapy  Managed by: Reina Fuse, PharmD Referring MD: Hillis Range, MD PCP: Dr. Kellie Shropshire Supervising MD: Eden Emms MD, Theron Arista Indication 1: Atrial Fibrillation Lab Used: [facility] [place] Site: [address] INR POC 3.0 INR RANGE 2 - 3  Dietary changes: yes       Details: Has eaten a few more salads than usual the past few days.   Health status changes: no    Bleeding/hemorrhagic complications: no    Recent/future hospitalizations: no    Any changes in medication regimen? no    Recent/future dental: no  Any missed doses?: no       Is patient compliant with meds? yes      Comments: Back pain from herniated disk. Pt reports taking more ibuprofen than usual. Discussed increased risk of bleeding. Pt not taking ibuprofen every day. May have another injection later this month and will call clinic if need to hold Coumadin.   Allergies: 1)  ! Medrol (Methylprednisolone) 2)  ! Sulfa 3)  ! Codeine  Anticoagulation Management History:      The patient is taking warfarin and comes in today for a routine follow up visit.  Negative risk factors for bleeding include an age less than 17 years old.  The bleeding index is 'low risk'.  Negative CHADS2 values include Age > 61 years old.  Her last INR was 2.1 RATIO.  Anticoagulation responsible provider: Eden Emms MD, Theron Arista.  INR POC: 3.0.  Cuvette Lot#: [id number].  Exp: 07/2011.    Anticoagulation Management Assessment/Plan:      The patient's current anticoagulation dose is Warfarin sodium 5 mg tabs: Use as directed by anticoagulation clinic.  The target INR is 2.0-3.0.  The next INR is due 08/05/2010.  Anticoagulation instructions were given to patient.  Results were reviewed/authorized by Reina Fuse, PharmD.  She was notified by Reina Fuse, PharmD.         Prior Anticoagulation Instructions: INR 2.5  Continue Coumadin as scheduled:  1 tablet every day of the week, except 1 and 1/2 tablets on  Monday, Wednesday, and Friday.  Return to clinic in 4 weeks.    Current Anticoagulation Instructions: INR 3.0  Continue taking Coumadin 1 tab (5 mg) on Sun, Tues, Thur, Sat and Coumadin 1.5 tabs (7.5 mg) on Mon, Wed, Fri. Return to clinic 4 weeks.

## 2010-09-29 NOTE — Medication Information (Signed)
Summary: rov/sp  Anticoagulant Therapy  Managed by: Bethena Midget, RN, BSN Referring MD: Hillis Range, MD PCP: Dr. Kellie Shropshire Supervising MD: Johney Frame MD, Fayrene Fearing Indication 1: Atrial Fibrillation Lab Used: [facility] [place] Site: [address] INR POC 1.2 INR RANGE 2 - 3  Dietary changes: no    Health status changes: no    Bleeding/hemorrhagic complications: no    Recent/future hospitalizations: no    Any changes in medication regimen? no    Recent/future dental: no  Any missed doses?: yes     Details: Holding for spinal injections this AM  Is patient compliant with meds? yes      Comments:  Took Last dose on Sunday.  Allergies: 1)  ! Medrol (Methylprednisolone) 2)  ! Sulfa 3)  ! Codeine  Anticoagulation Management History:      The patient is taking warfarin and comes in today for a routine follow up visit.  Negative risk factors for bleeding include an age less than 69 years old.  The bleeding index is 'low risk'.  Negative CHADS2 values include Age > 29 years old.  Her last INR was 2.1 RATIO.  Anticoagulation responsible provider: Christia Domke MD, Fayrene Fearing.  INR POC: 1.2.  Cuvette Lot#: [id number].  Exp: 09/2010.    Anticoagulation Management Assessment/Plan:      The patient's current anticoagulation dose is Warfarin sodium 5 mg tabs: Use as directed by anticoagulation clinic.  The target INR is 2.0-3.0.  The next INR is due 08/17/2010.  Anticoagulation instructions were given to patient.  Results were reviewed/authorized by Bethena Midget, RN, BSN.  She was notified by Bethena Midget, RN, BSN.         Prior Anticoagulation Instructions: INR 2.4 Hold coumadin until we call with clearance for procedure on Friday. Restart post procedure per MD doing injection. Recheck INR in 7-10 days.   Current Anticoagulation Instructions: INR 1.2 Resume per MD instruction ASAP. 5mg s daily except 7.5mg s on Mondays, Wednesdays and Fridays.

## 2010-09-29 NOTE — Medication Information (Signed)
Summary: ccr  Anticoagulant Therapy  Managed by: Weston Brass, PharmD Referring MD: Hillis Range, MD PCP: Dr. Kellie Shropshire Supervising MD: Daleen Squibb MD, Maisie Fus Indication 1: Atrial Fibrillation Lab Used: [facility] [place] Site: [address] INR POC 2.5 INR RANGE 2 - 3  Dietary changes: no    Health status changes: no    Bleeding/hemorrhagic complications: no    Recent/future hospitalizations: no    Any changes in medication regimen? no    Recent/future dental: no  Any missed doses?: no       Is patient compliant with meds? yes       Allergies: 1)  ! Medrol (Methylprednisolone) 2)  ! Sulfa 3)  ! Codeine  Anticoagulation Management History:      The patient is taking warfarin and comes in today for a routine follow up visit.  Negative risk factors for bleeding include an age less than 77 years old.  The bleeding index is 'low risk'.  Negative CHADS2 values include Age > 58 years old.  Her last INR was 2.1 RATIO.  Anticoagulation responsible provider: Daleen Squibb MD, Maisie Fus.  INR POC: 2.5.  Cuvette Lot#: [id number].  Exp: 07/2011.    Anticoagulation Management Assessment/Plan:      The patient's current anticoagulation dose is Warfarin sodium 5 mg tabs: Use as directed by anticoagulation clinic.  The target INR is 2.0-3.0.  The next INR is due 07/08/2010.  Anticoagulation instructions were given to patient.  Results were reviewed/authorized by Weston Brass, PharmD.  She was notified by Haynes Hoehn, PharmD Candidate.         Prior Anticoagulation Instructions: INR 2.3  Continue taking one tablet every day except for one and one-half tablet on Monday, Wednesday, and Friday. We will see you in three weeks.   Current Anticoagulation Instructions: INR 2.5  Continue Coumadin as scheduled:  1 tablet every day of the week, except 1 and 1/2 tablets on Monday, Wednesday, and Friday.  Return to clinic in 4 weeks.

## 2010-09-29 NOTE — Miscellaneous (Signed)
  Clinical Lists Changes  Observations: Added new observation of RESULTS MISC: ABLATION   1. Frequent premature ventricular contractions and nonsustained       ventricular tachycardia of a left bundle-branch inferior axis       successfully ablated at the level of the pulmonic valve within the       right ventricular outflow tract along the anteroseptal portion.   2. No further ventricular arrhythmias following ablation both on and       off isoproterenol with rapid ventricular pacing.   3. No evidence of dual AV nodal physiology or accessory pathways with       no supraventricular tachycardias induced.   4. No early apparent complications.  (11/04/2008 15:26)      MISC. Report  Procedure date:  11/04/2008  Findings:      ABLATION   1. Frequent premature ventricular contractions and nonsustained       ventricular tachycardia of a left bundle-branch inferior axis       successfully ablated at the level of the pulmonic valve within the       right ventricular outflow tract along the anteroseptal portion.   2. No further ventricular arrhythmias following ablation both on and       off isoproterenol with rapid ventricular pacing.   3. No evidence of dual AV nodal physiology or accessory pathways with       no supraventricular tachycardias induced.   4. No early apparent complications.

## 2010-09-29 NOTE — Letter (Signed)
Summary: Appointment - Reminder 2  [facility], Main Office  [address]   [place], [place] [address]   Phone: [phone]  Fax: [phone]     May 13, 2009 MRN: [id number]   KENIYA SCHLOTTERBECK [address] [place], [place]  [address]   Dear Ms. Andringa,  Our records indicate that it is time to schedule a follow-up appointment with Dr. Johney Frame. It is very important that we reach you to schedule this appointment.   We look forward to participating in your health care needs. Please contact us at the number listed above at your earliest convenience to schedule your appointment.  If you are unable to make an appointment at this time, give [place] a call so we can update our records.  Sincerely,   Burnard Leigh [facility] Scheduling Team

## 2010-09-29 NOTE — Medication Information (Signed)
Summary: ccr  Anticoagulant Therapy  Managed by: Bethena Midget, RN, BSN Referring MD: Hillis Range, MD PCP: Dr. Kellie Shropshire Supervising MD: Shirlee Latch MD, Jerita Wimbush Indication 1: Atrial Fibrillation Lab Used: [facility] [place] Site: [address] INR POC 4.5 INR RANGE 2 - 3  Dietary changes: no    Health status changes: no    Bleeding/hemorrhagic complications: no    Recent/future hospitalizations: no    Any changes in medication regimen? no    Recent/future dental: no  Any missed doses?: no       Is patient compliant with meds? yes       Allergies: 1)  ! Medrol (Methylprednisolone) 2)  ! Sulfa 3)  ! Codeine  Anticoagulation Management History:      The patient is taking warfarin and comes in today for a routine follow up visit.  Negative risk factors for bleeding include an age less than 53 years old.  The bleeding index is 'low risk'.  Negative CHADS2 values include Age > 8 years old.  Her last INR was 2.1 RATIO.  Anticoagulation responsible provider: Shirlee Latch MD, Shajuana Mclucas.  INR POC: 4.5.  Cuvette Lot#: [id number].  Exp: 03/2011.    Anticoagulation Management Assessment/Plan:      The patient's current anticoagulation dose is Warfarin sodium 5 mg tabs: Use as directed by anticoagulation clinic.  The next INR is due 02/02/2010.  Anticoagulation instructions were given to patient.  Results were reviewed/authorized by Bethena Midget, RN, BSN.  She was notified by Bethena Midget, RN, BSN.         Prior Anticoagulation Instructions: INR 1.9  Take 2 tabs today then resume 1.5 tabs each day except 1 tab on Mondays.  Recheck in 4 weeks.   Current Anticoagulation Instructions: INR 4.5 Skip today's dose and on Thursday take only 5mg   then resume 7.5mg  daily except 5mg  on Mondays. Recheck in 2 weeks.

## 2010-09-29 NOTE — Medication Information (Signed)
Summary: rov/jk  Anticoagulant Therapy  Managed by: Rolland Porter, PharmD Referring MD: Hillis Range, MD PCP: Dr. Kellie Shropshire Supervising MD: Tenny Craw MD, Gunnar Fusi Indication 1: Atrial Fibrillation Lab Used: [facility] [place] Site: [address] INR POC 2.3 INR RANGE 2 - 3  Dietary changes: no    Health status changes: no    Bleeding/hemorrhagic complications: no    Recent/future hospitalizations: no    Any changes in medication regimen? yes       Details: bactrim course completed since last visit  Recent/future dental: no  Any missed doses?: no       Is patient compliant with meds? yes       Allergies: 1)  ! Medrol (Methylprednisolone) 2)  ! Sulfa 3)  ! Codeine  Anticoagulation Management History:      The patient is taking warfarin and comes in today for a routine follow up visit.  Negative risk factors for bleeding include an age less than 20 years old.  The bleeding index is 'low risk'.  Negative CHADS2 values include Age > 65 years old.  Her last INR was 2.1 RATIO.  Anticoagulation responsible provider: Tenny Craw MD, Gunnar Fusi.  INR POC: 2.3.  Cuvette Lot#: [id number].  Exp: 07/2011.    Anticoagulation Management Assessment/Plan:      The patient's current anticoagulation dose is Warfarin sodium 5 mg tabs: Use as directed by anticoagulation clinic.  The target INR is 2.0-3.0.  The next INR is due 06/01/2010.  Anticoagulation instructions were given to patient.  Results were reviewed/authorized by Rolland Porter, PharmD.  She was notified by Kennieth Francois.         Prior Anticoagulation Instructions: INR 3.7  Skip today's dose (August 22nd).  Being new schedule of taking 1 tablet (5mg ) every day except take 1.5 tablets (7.5mg ) on Mondays, Wednesdays, and Fridays.  Recheck in 3 weeks.    Current Anticoagulation Instructions: INR 2.3  Continue taking one tablet every day except for one and one-half tablet on Monday, Wednesday, and Friday. We will see you in three weeks.

## 2010-09-29 NOTE — Medication Information (Signed)
Summary: new pt for [place] previously on coumadin by Dr. Elsie Lincoln  Anticoagulant Therapy  Managed by: Shelby Dubin, PharmD, BCPS, CPP Referring MD: Hillis Range, MD PCP: Dr. Kellie Shropshire Supervising MD: Johney Frame MD, Fayrene Fearing Indication 1: Atrial Fibrillation Lab Used: [facility] [place] Site: [address] INR POC 1.9 INR RANGE 2 - 3  Dietary changes: no    Health status changes: yes       Details: has had 3 steroid injections for ruptured disk in back since 05/2009.  Bleeding/hemorrhagic complications: no    Recent/future hospitalizations: no    Any changes in medication regimen? no    Recent/future dental: no  Any missed doses?: yes     Details: Missed "Sunday's dose   Is patient compliant with meds? yes      Comments: Neuro MD is Dr. Cabbell--  Current Medications (verified): 1)  Warfarin Sodium 5 Mg Tabs (Warfarin Sodium) .... 7.5 Everyday Except Monday 5 Mg. 2)  Nadolol 20 Mg Tabs (Nadolol) .... Take One Tablet Daily  Allergies: 1)  ! Medrol (Methylprednisolone) 2)  ! Sulfa 3)  ! Codeine  Anticoagulation Management History:      The patient is taking warfarin and comes in today for a routine follow up visit.  Negative risk factors for bleeding include an age less than 65 years old.  The bleeding index is 'low risk'.  Negative CHADS2 values include Age > 75 years old.  Her last INR was 2.1 RATIO.  Anticoagulation responsible provider: Allred MD, James.  INR POC: 1.9.  Cuvette Lot#: [id number].  Exp: 12/2010.    Anticoagulation Management Assessment/Plan:      The patient's current anticoagulation dose is Warfarin sodium 5 mg tabs: 7.5 everyday except Monday 5 mg..  The next INR is due 12/24/2009.  Anticoagulation instructions were given to patient.  Results were reviewed/authorized by Mary Parker, PharmD, BCPS, CPP.  She was notified by Mary Parker PharmD, BCPS, CPP.         Current Anticoagulation Instructions: INR 1.9  Take 2 tabs today then resume 1.5 tabs  each day except 1 tab on Mondays.  Recheck in 4 weeks.   Appended Document: new pt for us previously on coumadin by Dr. Gamble    Prescriptions: WARFARIN SODIUM 5 MG TABS (WARFARIN SODIUM) Use as directed by anticoagulation clinic  #140 x 3   Entered by:   Mary Parker PharmD, BCPS, CPP   Authorized by:   James Allred, MD   Signed by:   Mary Parker PharmD, BCPS, CPP on 11/26/2009   Method used:   Electronically to        [facility]* (retail)       [address]       [address] Shipping/mailing       [place], [place]  [address]       Ph: [phone]       Fax: [phone]   RxID:   [id number]

## 2010-09-29 NOTE — Assessment & Plan Note (Signed)
Summary: [facility]   Visit Type:  Follow-up Referring Provider:  Chanda Busing, MD  CC:  [facility].  History of Present Illness: The patient presents today for followup of her symptomatically ventricular contractions. She was previously evaluated by me and underwent PVC ablation in March of 2010. Prior to her ablation she was having PVCs in a bigeminal pattern on a daily basis. She's had a very significant improvement in her PVC burden. Now she reports only 5-10 PVCs per day. She is however reluctant to discontinue her Bystolic due to these PVCs. She denies chest pain, shortness of breath, significant palpitations, presyncope, syncope, or other concerns.  Current Medications (verified): 1)  Warfarin Sodium 5 Mg Tabs (Warfarin Sodium) .... Use As Directed By Anticoagulation Clinic 2)  Bystolic 20 Mg Tabs (Nebivolol Hcl) .Marland Kitchen.. 1 and 1/2 Tablet Once Daily  Allergies (verified): 1)  ! * Medrol 2)  ! Sulfa 3)  ! Codeine  Past History:  Past Medical History:    1. Symptomatic premature ventricular contractions.    2. Remote paroxysmal atrial fibrillation.    3. Pulmonary embolus previously.    4. Recurrent deep venous thrombosis, chronically anticoagulated with        Coumadin.    5. The patient reports a severe strep throat infection at age 79 and        is not clear as to whether or not she may have had rheumatic fever.    6. Uterine artery embolization in 2008 for uterine bleeding.   Family History:    Reviewed history from 12/05/2008 and no changes required:       She is unaware of any familial clotting disorders.  Her         mother had hypertension and diabetes.  Her father died suddenly at age         28 during surgery for appendectomy.            Social History:    Reviewed history from 12/05/2008 and no changes required:        The patient lives in [place] with her son and         brother.  She works as a Engineer, civil (consulting) for [employer].  She denies tobacco         and  rarely drinks alcohol.  She denies drug use.            Review of Systems       All systems are reviewed and negative except as listed in the HPI.   Vital Signs:  Patient profile:   55 year old female Height:      69 inches Weight:      229 pounds BMI:     33.94 Pulse rate:   71 / minute Pulse rhythm:   regular BP sitting:   122 / 90  (left arm) Cuff size:   regular  Vitals Entered By: Flonnie Overman (December 09, 2008 10:07 AM)  Physical Exam  General:  Well developed, well nourished, in no acute distress. Head:  normocephalic and atraumatic Eyes:  PERRLA/EOM intact; conjunctiva and lids normal. Nose:  no deformity, discharge, inflammation, or lesions Mouth:  Teeth, gums and palate normal. Oral mucosa normal. Neck:  Neck supple, no JVD. No masses, thyromegaly or abnormal cervical nodes. Lungs:  Clear bilaterally to auscultation and percussion. Heart:  Non-displaced PMI, chest non-tender; regular rate and rhythm, S1, S2 without murmurs, rubs or gallops. Carotid upstroke normal, no bruit. Normal abdominal aortic size, no  bruits. Femorals normal pulses, no bruits. Pedals normal pulses. No edema, no varicosities. Abdomen:  Bowel sounds positive; abdomen soft and non-tender without masses, organomegaly, or hernias noted. No hepatosplenomegaly. Msk:  Back normal, normal gait. Muscle strength and tone normal. Pulses:  pulses normal in all 4 extremities Extremities:  No clubbing or cyanosis. Neurologic:  Alert and oriented x 3. Skin:  Intact without lesions or rashes. Cervical Nodes:  no significant adenopathy Psych:  Normal affect.   EKG  Procedure date:  12/09/2008  Findings:      sinus rhythm 71 bpm, incomplete RBBB otherwise normal  Impression & Recommendations:  Problem # 1:  PREMATURE VENTRICULAR CONTRACTIONS (ICD-427.69) The patient has had a dramatic improvement in the burden of her PVCs with ablation.  She is pleased with this reduction in PVCs.  She continues to note  rare PVCs, which I suspect that she will always have.  I have reassured her today.  She will decrease her Bystolic to 1 tablet daily x 4-6 weeks, then 0.5 tablets daily.  Hopefully, she will not require beta  blockers long term.  Problem # 2:  ATRIAL FIBRILLATION (ICD-427.31) She has had no symptomatic episodes of atrial fibrillation recently.  Patient Instructions: 1)  The patient will continue her regular follow-up with Dr Elsie Lincoln.  She will see me back 1 more time in 3 months.

## 2010-09-29 NOTE — Medication Information (Signed)
  Anticoagulant Therapy  Managed by: Weston Brass, PharmD Referring MD: Hillis Range, MD PCP: Dr. Kellie Shropshire Supervising MD: Gala Romney MD, Reuel Boom Indication 1: Atrial Fibrillation Lab Used: [facility] [place] Site: [address] INR POC 3.7 INR RANGE 2 - 3  Dietary changes: yes       Details: Ate a few more greens last week.  Had spinach last night.  Health status changes: no    Bleeding/hemorrhagic complications: no    Recent/future hospitalizations: no    Any changes in medication regimen? no    Recent/future dental: no  Any missed doses?: no       Is patient compliant with meds? yes       Allergies: 1)  ! Medrol (Methylprednisolone) 2)  ! Sulfa 3)  ! Codeine  Anticoagulation Management History:      The patient is taking warfarin and comes in today for a routine follow up visit.  Negative risk factors for bleeding include an age less than 21 years old.  The bleeding index is 'low risk'.  Negative CHADS2 values include Age > 16 years old.  Her last INR was 2.1 RATIO.  Anticoagulation responsible provider: Bensimhon MD, Reuel Boom.  INR POC: 3.7.  Cuvette Lot#: [id number].  Exp: 05/2011.    Anticoagulation Management Assessment/Plan:      The patient's current anticoagulation dose is Warfarin sodium 5 mg tabs: Use as directed by anticoagulation clinic.  The target INR is 2.0-3.0.  The next INR is due 05/11/2010.  Anticoagulation instructions were given to patient.  Results were reviewed/authorized by Weston Brass, PharmD.  She was notified by Gweneth Fritter, PharmD Candidate.         Prior Anticoagulation Instructions: INR 2.7  Completed Bactrim on 03/27/10.  Resume same dosage 1.5 tablets daily except 1 tablet on Tuesdays, Thursdays, and Saturdays.  Recheck in 3 weeks.    Current Anticoagulation Instructions: INR 3.7  Skip today's dose (August 22nd).  Being new schedule of taking 1 tablet (5mg ) every day except take 1.5 tablets (7.5mg ) on Mondays, Wednesdays, and  Fridays.  Recheck in 3 weeks.

## 2010-09-29 NOTE — Progress Notes (Signed)
Summary:  name of vein specialist referral  Phone Note Call from Patient Call back at Home Phone [phone]   Caller: Patient Reason for Call: Talk to Nurse, Referral Summary of Call: per pt calling, what the name of the vein specialist the was referral to pt.  Initial call taken by: Lorne Skeens,  March 20, 2010 11:17 AM  Follow-up for Phone Call        Dr Donia Ast [phone] pt aware Dennis Bast, RN, BSN  March 20, 2010 11:42 AM

## 2010-09-29 NOTE — Letter (Signed)
Summary: [facility] Office Note  [facility] Office Note   Imported By: Roderic Ovens 07/18/2009 10:59:04  _____________________________________________________________________  External Attachment:    Type:   Image     Comment:   External Document

## 2010-09-29 NOTE — Progress Notes (Signed)
----   Converted from flag ---- ---- 08/05/2010 4:55 PM, Hillis Range, MD wrote: I would restart coumadin ASAP after the procedure.  ---- 08/03/2010 9:59 AM, Bethena Midget, RN, BSN wrote: Pt pending steriod injection on 08/07/10 at [facility]. She needs to be off for 4 days. Thus, last dose yesterday. Is she cleared to hold? ------------------------------       Additional Follow-up for Phone Call Additional follow up Details #2::    Telephoned pt made her aware to restart ASAP. F/U scheduled for 08/17/10.  Follow-up by: Bethena Midget, RN, BSN,  August 06, 2010 9:10 AM

## 2010-10-01 NOTE — Medication Information (Signed)
Summary: rov/sp  Anticoagulant Therapy  Managed by: Weston Brass, PharmD Referring MD: Hillis Range, MD PCP: Dr. Kellie Shropshire Supervising MD: Gala Romney MD, Reuel Boom Indication 1: Atrial Fibrillation Lab Used: [facility] [place] Site: [address] INR POC 3.8 INR RANGE 2 - 3  Dietary changes: no    Health status changes: no    Bleeding/hemorrhagic complications: no    Recent/future hospitalizations: no    Any changes in medication regimen? no    Recent/future dental: no  Any missed doses?: no       Is patient compliant with meds? yes       Allergies: 1)  ! Medrol (Methylprednisolone) 2)  ! Sulfa 3)  ! Codeine  Anticoagulation Management History:      The patient is taking warfarin and comes in today for a routine follow up visit.  Negative risk factors for bleeding include an age less than 9 years old.  The bleeding index is 'low risk'.  Negative CHADS2 values include Age > 33 years old.  Her last INR was 2.1 RATIO.  Anticoagulation responsible provider: Bensimhon MD, Reuel Boom.  INR POC: 3.8.  Cuvette Lot#: [id number].  Exp: 10/2011.    Anticoagulation Management Assessment/Plan:      The patient's current anticoagulation dose is Warfarin sodium 5 mg tabs: Use as directed by anticoagulation clinic.  The target INR is 2.0-3.0.  The next INR is due 09/28/2010.  Anticoagulation instructions were given to patient.  Results were reviewed/authorized by Weston Brass, PharmD.  She was notified by Stephannie Peters, PharmD Candidate .         Prior Anticoagulation Instructions: INR 2.3  Continue same dose of 1 tablet every day except 1 1/2 tablets on Monday, Wednesday and Friday.  Recheck INR in 4 weeks.   Current Anticoagulation Instructions: INR 3.8  Coumadin 5 mg tablets - Skip todays dose, then resume 1 tablet every day except 1.5 tables on Mondays, Wednesdays and Fridays

## 2010-10-01 NOTE — Medication Information (Signed)
Summary: rov/tm  Anticoagulant Therapy  Managed by: Weston Brass, PharmD Referring MD: Hillis Range, MD PCP: Dr. Kellie Shropshire Supervising MD: Tenny Craw MD, Gunnar Fusi Indication 1: Atrial Fibrillation Lab Used: [facility] [place] Site: [address] INR POC 2.3 INR RANGE 2 - 3  Dietary changes: no    Health status changes: no    Bleeding/hemorrhagic complications: no    Recent/future hospitalizations: no    Any changes in medication regimen? no    Recent/future dental: no  Any missed doses?: no       Is patient compliant with meds? yes       Allergies: 1)  ! Medrol (Methylprednisolone) 2)  ! Sulfa 3)  ! Codeine  Anticoagulation Management History:      The patient is taking warfarin and comes in today for a routine follow up visit.  Negative risk factors for bleeding include an age less than 65 years old.  The bleeding index is 'low risk'.  Negative CHADS2 values include Age > 58 years old.  Her last INR was 2.1 RATIO.  Anticoagulation responsible provider: Tenny Craw MD, Gunnar Fusi.  INR POC: 2.3.  Cuvette Lot#: [id number].  Exp: 10/2011.    Anticoagulation Management Assessment/Plan:      The patient's current anticoagulation dose is Warfarin sodium 5 mg tabs: Use as directed by anticoagulation clinic.  The target INR is 2.0-3.0.  The next INR is due 09/14/2010.  Anticoagulation instructions were given to patient.  Results were reviewed/authorized by Weston Brass, PharmD.  She was notified by Weston Brass PharmD.         Prior Anticoagulation Instructions: INR 1.2 Resume per MD instruction ASAP. 5mg s daily except 7.5mg s on Mondays, Wednesdays and Fridays.   Current Anticoagulation Instructions: INR 2.3  Continue same dose of 1 tablet every day except 1 1/2 tablets on Monday, Wednesday and Friday.  Recheck INR in 4 weeks.

## 2010-10-07 ENCOUNTER — Encounter: Payer: Self-pay | Admitting: Internal Medicine

## 2010-10-07 ENCOUNTER — Encounter (INDEPENDENT_AMBULATORY_CARE_PROVIDER_SITE_OTHER): Payer: 59

## 2010-10-07 ENCOUNTER — Ambulatory Visit (INDEPENDENT_AMBULATORY_CARE_PROVIDER_SITE_OTHER): Payer: 59 | Admitting: Internal Medicine

## 2010-10-07 ENCOUNTER — Other Ambulatory Visit: Payer: Self-pay | Admitting: Internal Medicine

## 2010-10-07 DIAGNOSIS — R0989 Other specified symptoms and signs involving the circulatory and respiratory systems: Secondary | ICD-10-CM

## 2010-10-07 DIAGNOSIS — I4891 Unspecified atrial fibrillation: Secondary | ICD-10-CM

## 2010-10-07 DIAGNOSIS — R030 Elevated blood-pressure reading, without diagnosis of hypertension: Secondary | ICD-10-CM | POA: Insufficient documentation

## 2010-10-07 DIAGNOSIS — R609 Edema, unspecified: Secondary | ICD-10-CM | POA: Insufficient documentation

## 2010-10-07 LAB — CBC WITH DIFFERENTIAL/PLATELET
Basophils Absolute: 0 10*3/uL (ref 0.0–0.1)
Basophils Relative: 0.5 % (ref 0.0–3.0)
Eosinophils Absolute: 0.1 10*3/uL (ref 0.0–0.7)
Eosinophils Relative: 1.3 % (ref 0.0–5.0)
HCT: 41.2 % (ref 36.0–46.0)
Hemoglobin: 13.9 g/dL (ref 12.0–15.0)
Lymphocytes Relative: 21.2 % (ref 12.0–46.0)
Lymphs Abs: 1.5 10*3/uL (ref 0.7–4.0)
MCHC: 33.6 g/dL (ref 30.0–36.0)
MCV: 83 fl (ref 78.0–100.0)
Monocytes Absolute: 0.5 10*3/uL (ref 0.1–1.0)
Monocytes Relative: 6.4 % (ref 3.0–12.0)
Neutro Abs: 5.1 10*3/uL (ref 1.4–7.7)
Neutrophils Relative %: 70.6 % (ref 43.0–77.0)
Platelets: 261 10*3/uL (ref 150.0–400.0)
RBC: 4.96 Mil/uL (ref 3.87–5.11)
RDW: 14.6 % (ref 11.5–14.6)
WBC: 7.2 10*3/uL (ref 4.5–10.5)

## 2010-10-07 LAB — BASIC METABOLIC PANEL
BUN: 15 mg/dL (ref 6–23)
CO2: 31 mEq/L (ref 19–32)
Calcium: 9.8 mg/dL (ref 8.4–10.5)
Chloride: 106 mEq/L (ref 96–112)
Creatinine, Ser: 0.8 mg/dL (ref 0.4–1.2)
GFR: 103.38 mL/min (ref >60.00)
Glucose, Bld: 98 mg/dL (ref 70–99)
Potassium: 4.8 mEq/L (ref 3.5–5.1)
Sodium: 141 mEq/L (ref 135–145)

## 2010-10-07 LAB — PROTIME-INR
INR: 3.6 ratio — ABNORMAL HIGH (ref 0.8–1.0)
Prothrombin Time: 35.2 s — ABNORMAL HIGH (ref 10.2–12.4)

## 2010-10-07 NOTE — Medication Information (Signed)
Summary: rov/sp  Anticoagulant Therapy  Managed by: Bethena Midget, RN, BSN Referring MD: Hillis Range, MD PCP: Dr. Kellie Shropshire Supervising MD: Patty Sermons Indication 1: Atrial Fibrillation Lab Used: [facility] [place] Site: [address] INR POC 2.8 INR RANGE 2 - 3  Dietary changes: no    Health status changes: no    Bleeding/hemorrhagic complications: no    Recent/future hospitalizations: no    Any changes in medication regimen? no    Recent/future dental: no  Any missed doses?: no       Is patient compliant with meds? yes       Allergies: 1)  ! Medrol (Methylprednisolone) 2)  ! Sulfa 3)  ! Codeine  Anticoagulation Management History:      The patient is taking warfarin and comes in today for a routine follow up visit.  Negative risk factors for bleeding include an age less than 35 years old.  The bleeding index is 'low risk'.  Negative CHADS2 values include Age > 53 years old.  Her last INR was 2.1 RATIO.  Anticoagulation responsible provider: Alta Goding.  INR POC: 2.8.  Cuvette Lot#: [id number].  Exp: 08/2011.    Anticoagulation Management Assessment/Plan:      The patient's current anticoagulation dose is Warfarin sodium 5 mg tabs: Use as directed by anticoagulation clinic.  The target INR is 2.0-3.0.  The next INR is due 10/26/2010.  Anticoagulation instructions were given to patient.  Results were reviewed/authorized by Bethena Midget, RN, BSN.  She was notified by Bethena Midget, RN, BSN.         Prior Anticoagulation Instructions: INR 3.8  Coumadin 5 mg tablets - Skip todays dose, then resume 1 tablet every day except 1.5 tables on Mondays, Wednesdays and Fridays   Current Anticoagulation Instructions: INR 2.8 Continue 5mg s daily except 7.5mg s on Mondays, Wednesdays and Fridays. Recheck in 4 weeks.

## 2010-10-08 ENCOUNTER — Ambulatory Visit (HOSPITAL_COMMUNITY)
Admission: RE | Admit: 2010-10-08 | Discharge: 2010-10-08 | Disposition: A | Discharge status: Home / Self Care | Payer: 59 | Source: Ambulatory Visit | Attending: Internal Medicine | Admitting: Internal Medicine

## 2010-10-08 DIAGNOSIS — I4891 Unspecified atrial fibrillation: Secondary | ICD-10-CM | POA: Insufficient documentation

## 2010-10-15 NOTE — Letter (Signed)
Summary: Cardioversion/TEE Instructions  Architectural technologist, Main Office  [address]   [place], [place] [address]   Phone: [phone]  Fax: [phone]    Cardioversion  Instructions  10/07/2010 MRN: [id number]  MELONEY FELD [address] [place], [place]  [address]  [place]  Dear Ms. Howton, You are scheduled for a Cardioversion on Thursday 10/08/10 with Dr. Graciela Husbands   Please arrive at the [facility] at 5:30 a.m. / p.m. on the day of your procedure.  1)   DIET:  A)   Nothing to eat or drink after midnight except your medications with a sip of water.  B)   May have clear liquid breakfast, then nothing to eat or drink after _________ a.m. / p.m.      Clear liquids include:  water, broth, Sprite, Ginger Ale, black coffee, tea (no sugar),      cranberry / grape / apple juice, jello (not red), popsicle from clear juices (not red).  2)   Come to the [place] office on ____________________ for lab work. The lab at [facility] is open from 8:30 a.m. to 1:30 p.m. and 2:30 p.m. to 5:00 p.m. The lab at [facility] is open from 7:30 a.m. to 5:30 p.m. You do not have to be fasting.  3)   MAKE SURE YOU TAKE YOUR COUMADIN.  4)   A)   DO NOT TAKE these medications before your procedure:      ___________________________________________________________________     ___________________________________________________________________     ___________________________________________________________________  B)   YOU MAY TAKE ALL of your remaining medications with a small amount of water.    C)   START NEW medications:       ___________________________________________________________________     ___________________________________________________________________  5)  Must have a responsible person to drive you home.  6)   Bring a current list of your medications and current insurance cards.   * Special Note:  Every effort is made to  have your procedure done on time. Occasionally there are emergencies that present themselves at the hospital that may cause delays. Please be patient if a delay does occur.  * If you have any questions after you get home, please call the office at [phone].

## 2010-10-15 NOTE — Assessment & Plan Note (Signed)
Summary: add on per  klein/afib/saf   Referring Provider:  Chanda Busing, MD Primary Provider:  Dr. Kellie Shropshire  CC:  atrial fibrillation-add on.  Pt feeling anxious and feels like she is racing.Marland Kitchen  History of Present Illness: Pamela Davenport is seen because of palpitations thaqt began this am at about 3am She is found to be in atrial fibrillation  this is quite disruptive with LH and mild sob     Current Medications (verified): 1)  Warfarin Sodium 5 Mg Tabs (Warfarin Sodium) .... Use As Directed By Anticoagulation Clinic/ 7.5 Mon, Wed, Fri 5 Mg All Other Days 2)  Nadolol 20 Mg Tabs (Nadolol) .... Take One Tablet Daily  Allergies (verified): 1)  ! Medrol (Methylprednisolone) 2)  ! Sulfa 3)  ! Codeine  Past History:  Past Medical History: Last updated: 06/26/2009 1. Symptomatic premature ventricular contractions s/p ablation 2. Remote paroxysmal atrial fibrillation. 3. Pulmonary embolus previously. 4. Recurrent deep venous thrombosis, chronically anticoagulated with Coumadin. 5. The patient reports a severe strep throat infection at age 13 and     is not clear as to whether or not she may have had rheumatic fever. 6. Uterine artery embolization in 2008 for uterine bleeding.   Past Surgical History: Last updated: 06/26/2009  Uterine artery embolization in 2008 for uterine bleeding  Diagnostic D&C hysteroscopy and Novasure ablation. 03/17/2004  PVC ablation 2010  Family History: Last updated: 12/05/2008 She is unaware of any familial clotting disorders.  Her   mother had hypertension and diabetes.  Her father died suddenly at age   7 during surgery for appendectomy.      Social History: Last updated: 06/26/2009  The patient lives in [place] with her son and   brother.  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  She denies drug use.      Vital Signs:  Patient profile:   55 year old female Height:      69  inches Weight:      224 pounds BMI:     33.20 Pulse rate:   103 / minute Pulse rhythm:   irregular BP sitting:   145 / 98  (right arm) Cuff size:   large  Vitals Entered By: Judithe Modest CMA (October 07, 2010 1:59 PM)  Physical Exam  General:  The patient was alert and oriented in no acute distress.Neck veins were flat, carotids were brisk. Lungs were clear. Heart sounds were irregular without murmurs or gallops. Abdomen was soft with active bowel sounds. There is no clubbing cyanosis ; trace edema    Impression & Recommendations:  Problem # 1:  ATRIAL FIBRILLATION (ICD-427.31)  the patient has atrial fibrillation with a modestly rapid ventricular response .It is quite symptomatic we will undertake cardioversion in the am if she has not reverted on her own to sinus rhyhtm which is quite likely..we will check her INR today although wiht the revent onset it really shouldnt matter Her updated medication list for this problem includes:    Warfarin Sodium 5 Mg Tabs (Warfarin sodium) ..... Use as directed by anticoagulation clinic/ 7.5 mon, wed, fri 5 mg all other days    Nadolol 20 Mg Tabs (Nadolol) .Marland Kitchen... Take one tablet daily  Orders: TLB-BMP (Basic Metabolic Panel-BMET) (80048-METABOL) TLB-CBC Platelet - w/Differential (85025-CBCD) TLB-PT (Protime) (85610-PTP)  Problem # 2:  ELEVATED BLOOD PRESSURE (ICD-796.2) i am not sure if this is real or simply troday as she says her BP is  normally better  Problem # 3:  EDEMA (ICD-782.3) will keep eye on BP but should antifcipate using HCZ for BP if therapy required.  but hopefully she can modify this with decreased sodium intake  Patient Instructions: 1)  Labs today 2)  Your physician has recommended that you have a cardioversion (DCCV).  Electrical cardioversion uses a jolt of electricity to your heart either through paddles or wired patches attached to your chest. This is a controlled, usually prescheduled, procedure. Defibrillation is  done under light anesthesia in the hospital, and you usually go home the day of the procedure. This is done to get your heart back into a normal rhythm. You are not awake for the procedure. Please see the instruction sheet given to you today.

## 2010-10-16 ENCOUNTER — Telehealth: Payer: Self-pay | Admitting: Internal Medicine

## 2010-10-19 NOTE — Op Note (Signed)
  NAME:  Pamela Davenport, HESLIN NO.:  [id number]  MEDICAL RECORD NO.:  [id number]           PATIENT TYPE:  O  LOCATION:  [facility]                         FACILITY:  [facility]  PHYSICIAN:  Duke Salvia, MD, FACCDATE OF BIRTH:  05-Sep-1955  DATE OF PROCEDURE: DATE OF DISCHARGE:                              OPERATIVE REPORT   PREOPERATIVE DIAGNOSIS:  Atrial fibrillation.  POSTOPERATIVE DIAGNOSIS:  Sinus rhythm.  PROCEDURE IN DETAIL:  The patient was __________ with combination of Versed and fentanyl.  She received a total of 50 mg of Versed and 175 mcg of fentanyl.  A 125-joule shock was delivered in AP configuration synchronously with a QRS, restoring sinus rhythm.  The patient tolerated the procedure without apparent complication.     Duke Salvia, MD, [facility]     SCK/MEDQ  D:  10/08/2010  T:  10/08/2010  Job:  [id number]  Electronically Signed by Sherryl Manges MD [facility] on 10/19/2010 10:00:50 PM

## 2010-10-23 ENCOUNTER — Encounter: Payer: Self-pay | Admitting: Internal Medicine

## 2010-10-23 DIAGNOSIS — I2699 Other pulmonary embolism without acute cor pulmonale: Secondary | ICD-10-CM

## 2010-10-23 DIAGNOSIS — I4891 Unspecified atrial fibrillation: Secondary | ICD-10-CM

## 2010-10-23 DIAGNOSIS — I82409 Acute embolism and thrombosis of unspecified deep veins of unspecified lower extremity: Secondary | ICD-10-CM

## 2010-10-26 ENCOUNTER — Encounter: Payer: Self-pay | Admitting: Internal Medicine

## 2010-10-26 ENCOUNTER — Encounter (INDEPENDENT_AMBULATORY_CARE_PROVIDER_SITE_OTHER): Payer: 59

## 2010-10-26 DIAGNOSIS — Z7901 Long term (current) use of anticoagulants: Secondary | ICD-10-CM

## 2010-10-26 DIAGNOSIS — I4891 Unspecified atrial fibrillation: Secondary | ICD-10-CM

## 2010-10-26 LAB — CONVERTED CEMR LAB: POC INR: 1.5

## 2010-10-28 ENCOUNTER — Encounter (INDEPENDENT_AMBULATORY_CARE_PROVIDER_SITE_OTHER): Payer: 59 | Admitting: Internal Medicine

## 2010-10-28 ENCOUNTER — Encounter: Payer: Self-pay | Admitting: Internal Medicine

## 2010-10-28 ENCOUNTER — Encounter: Payer: 59 | Admitting: Internal Medicine

## 2010-10-28 DIAGNOSIS — R0789 Other chest pain: Secondary | ICD-10-CM | POA: Insufficient documentation

## 2010-10-28 DIAGNOSIS — R072 Precordial pain: Secondary | ICD-10-CM

## 2010-10-28 DIAGNOSIS — I4949 Other premature depolarization: Secondary | ICD-10-CM

## 2010-10-28 DIAGNOSIS — I4891 Unspecified atrial fibrillation: Secondary | ICD-10-CM

## 2010-11-05 NOTE — Progress Notes (Signed)
Summary: pt wants to talk w/nurse re problems she's having/ [email or web address]. cy  Phone Note Call from Patient   Caller: Patient [phone] or [phone] Reason for Call: Talk to Nurse Summary of Call: pt calling re some problems she's having Initial call taken by: Glynda Jaeger,  October 16, 2010 1:46 PM  Follow-up for Phone Call        lmtcb Scherrie Bateman, LPN  October 19, 2010 8:50 AM [facility]  Scherrie Bateman, LPN  October 20, 2010 8:28 AM   Additional Follow-up for Phone Call Additional follow up Details #1::        pt rtn your call Omer Jack  October 19, 2010 9:49 AM   [facility] Scherrie Bateman, LPN  October 21, 2010 8:29 AM  LMTCB Scherrie Bateman, LPN  October 21, 2010 3:26 PM   LMTCB Scherrie Bateman, LPN  October 23, 2010 10:32 AM      Additional Follow-up for Phone Call Additional follow up Details #2::    pt seen by DrAllred today in clinic Dennis Bast, RN, BSN  October 28, 2010 11:00 AM

## 2010-11-05 NOTE — Medication Information (Signed)
Summary: Pamela Davenport  Anticoagulant Therapy  Managed by: Tammy Sours, PharmD Referring MD: Hillis Range, MD PCP: Dr. Kellie Shropshire Supervising MD: Tenny Craw MD, Gunnar Fusi Indication 1: Atrial Fibrillation Lab Used: [facility] [place] Site: [address] INR POC 1.5 INR RANGE 2 - 3  Dietary changes: no    Health status changes: no    Bleeding/hemorrhagic complications: no    Recent/future hospitalizations: no    Any changes in medication regimen? no    Recent/future dental: no  Any missed doses?: yes     Details: See comments.  Is patient compliant with meds? yes      Comments: Per patient, Dr. Graciela Husbands dropped Coumadin dose on 2/9 to 1 tablet daily from 1 tablet TTSS, and 1 and 1/2 tablet MWF after patients cardioversion due to high INR of 3.6.  Allergies: 1)  ! Medrol (Methylprednisolone) 2)  ! Sulfa 3)  ! Codeine  Anticoagulation Management History:      The patient is taking warfarin and comes in today for a routine follow up visit.  Negative risk factors for bleeding include an age less than 15 years old.  The bleeding index is 'low risk'.  Negative CHADS2 values include Age > 29 years old.  Her last INR was 3.6 ratio.  Anticoagulation responsible provider: Tenny Craw MD, Gunnar Fusi.  INR POC: 1.5.  Cuvette Lot#: [id number].  Exp: 08/2011.    Anticoagulation Management Assessment/Plan:      The patient's current anticoagulation dose is Warfarin sodium 5 mg tabs: Use as directed by anticoagulation clinic/ 7.5 Mon, Wed, Fri 5 mg all other days.  The target INR is 2.0-3.0.  The next INR is due 11/09/2010.  Anticoagulation instructions were given to patient.  Results were reviewed/authorized by Tammy Sours, PharmD.         Prior Anticoagulation Instructions: INR 2.8 Continue 5mg s daily except 7.5mg s on Mondays, Wednesdays and Fridays. Recheck in 4 weeks.   Current Anticoagulation Instructions: INR 1.5  Take 2 tablets today. Then begin taking 1 tablet everday except 1 and 1/2  tablets on Mondays, Wednesdays, and Fridays. Recheck INR in 2 weeks.

## 2010-11-05 NOTE — Assessment & Plan Note (Signed)
Summary: eph/ per Dr.Klein   Visit Type:  Follow-up Referring Provider:  Chanda Busing, MD Primary Provider:  Dr. Kellie Shropshire  CC:  some shob.  History of Present Illness: The patient presents today for routine electrophysiology followup. She recently developed recurrent symptomatic afib and required cardioversion by Dr Graciela Husbands.  In addition to palpitations, she reports chest heaviness which persisted for several days after cardioversion.  She also reports lightheadedness for several days post cardioversion.  She has done well since that time.  She is unaware of any further afib.  She denies exertional chest pain.  Her PVCs remains quite rare.  The patient denies symptoms of shortness of breath, orthopnea, PND, lower extremity edema, syncope, or neurologic sequela. The patient is tolerating medications without difficulties and is otherwise without complaint today.   Current Medications (verified): 1)  Warfarin Sodium 5 Mg Tabs (Warfarin Sodium) .... Use As Directed By Anticoagulation Clinic/ 7.5 Mon, Wed, Fri 5 Mg All Other Days 2)  Nadolol 20 Mg Tabs (Nadolol) .... Take One Tablet Daily  Allergies (verified): 1)  ! Medrol (Methylprednisolone) 2)  ! Sulfa 3)  ! Codeine  Past History:  Past Medical History: 1. Symptomatic premature ventricular contractions s/p ablation 2. Paroxysmal atrial fibrillation. 3. Pulmonary embolus previously. 4. Recurrent deep venous thrombosis, chronically anticoagulated with Coumadin. 5. The patient reports a severe strep throat infection at age 61 and     is not clear as to whether or not she may have had rheumatic fever. 6. Uterine artery embolization in 2008 for uterine bleeding.   Past Surgical History: Reviewed history from 06/26/2009 and no changes required.  Uterine artery embolization in 2008 for uterine bleeding  Diagnostic D&C hysteroscopy and Novasure ablation. 03/17/2004  PVC ablation 2010  Social History: Reviewed history from 06/26/2009  and no changes required.  The patient lives in [place] with her son and   brother.  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  She denies drug use.      Review of Systems       All systems are reviewed and negative except as listed in the HPI.   Vital Signs:  Patient profile:   55 year old female Height:      69 inches Weight:      229.75 pounds BMI:     34.05 Pulse rate:   75 / minute BP sitting:   138 / 84  (left arm) Cuff size:   regular  Vitals Entered By: Micki Riley CNA (October 28, 2010 10:36 AM)  Physical Exam  General:  Well developed, well nourished, in no acute distress. Head:  normocephalic and atraumatic Eyes:  PERRLA/EOM intact; conjunctiva and lids normal. Neck:  supple Lungs:  Clear bilaterally to auscultation and percussion. Heart:  RRR, no m/r/g Abdomen:  Bowel sounds positive; abdomen soft and non-tender without masses, organomegaly, or hernias noted. No hepatosplenomegaly. Msk:  Back normal, normal gait. Muscle strength and tone normal. Extremities:  No clubbing or cyanosis. Neurologic:  Alert and oriented x 3.   EKG  Procedure date:  10/28/2010  Findings:      sinus rhythm, incomplete RBBB, otherwise normal ekg, no PVCs  Impression & Recommendations:  Problem # 1:  CHEST PAIN, ATYPICAL (ICD-786.59) given persistent chest pressure after cardioversion, I think that we should proceed with further cardiac risk stratefication. We will plan GXT myoview at the next available time.  Problem # 2:  ATRIAL FIBRILLATION (ICD-427.31) She recently  had her first episode of afib in about 10 years. She is chronically anticoagulated with coumadin due to recurrent DVT/PTE. We will continue nadolol for rate control. Unless her afib increases in frequency, I would like to avoid daily AAD.  If her Celine Ahr is normal, we could consider propafenone as a pill in pocket option.  She has not tolerated flecainide  previously.  Problem # 3:  PREMATURE VENTRICULAR CONTRACTIONS (ICD-427.69) controlled no changes  Other Orders: Nuclear Stress Test (Nuc Stress Test)  Patient Instructions: 1)  Your physician wants you to follow-up in:   3 months with Dr Johney Frame Bonita Quin will receive a reminder letter in the mail two months in advance. If you don't receive a letter, please call our office to schedule the follow-up appointment. 2)  Your physician has requested that you have an exercise stress myoview.  For further information please visit [email or web address]  Please follow instruction sheet, as given.

## 2010-11-10 ENCOUNTER — Telehealth (HOSPITAL_COMMUNITY): Payer: Self-pay | Admitting: Radiology

## 2010-11-11 ENCOUNTER — Encounter: Payer: Self-pay | Admitting: Cardiology

## 2010-11-11 ENCOUNTER — Encounter: Payer: Self-pay | Admitting: *Deleted

## 2010-11-11 ENCOUNTER — Encounter (INDEPENDENT_AMBULATORY_CARE_PROVIDER_SITE_OTHER): Payer: 59

## 2010-11-11 ENCOUNTER — Encounter (INDEPENDENT_AMBULATORY_CARE_PROVIDER_SITE_OTHER): Payer: Self-pay | Admitting: *Deleted

## 2010-11-11 ENCOUNTER — Ambulatory Visit (HOSPITAL_COMMUNITY): Payer: 59 | Attending: Cardiology

## 2010-11-11 DIAGNOSIS — R9431 Abnormal electrocardiogram [ECG] [EKG]: Secondary | ICD-10-CM

## 2010-11-11 DIAGNOSIS — I4891 Unspecified atrial fibrillation: Secondary | ICD-10-CM | POA: Insufficient documentation

## 2010-11-11 DIAGNOSIS — R0602 Shortness of breath: Secondary | ICD-10-CM

## 2010-11-11 DIAGNOSIS — Z7901 Long term (current) use of anticoagulants: Secondary | ICD-10-CM

## 2010-11-11 LAB — CONVERTED CEMR LAB: POC INR: 2.5

## 2010-11-17 NOTE — Medication Information (Signed)
Summary: rov/sp  Anticoagulant Therapy  Managed by: Leota Sauers, PharmD, BCPS, CPP Referring MD: Hillis Range, MD PCP: Dr. Kellie Shropshire Supervising MD: Riley Kill MD, Maisie Fus Indication 1: Atrial Fibrillation Lab Used: [facility] [place] Site: [address] INR POC 2.5 INR RANGE 2 - 3  Dietary changes: yes       Details: increase greens  Health status changes: no    Bleeding/hemorrhagic complications: yes       Details: a little gum bleeding that stops easily  Recent/future hospitalizations: no    Any changes in medication regimen? no    Recent/future dental: no  Any missed doses?: no       Is patient compliant with meds? yes      Comments: s/p DCCV in last 2-3 weeks  Current Medications (verified): 1)  Warfarin Sodium 5 Mg Tabs (Warfarin Sodium) .... Use As Directed By Anticoagulation Clinic/ 7.5 Mon, Wed, Fri 5 Mg All Other Days 2)  Nadolol 20 Mg Tabs (Nadolol) .... Take One Tablet Daily  Allergies: 1)  ! Medrol (Methylprednisolone) 2)  ! Sulfa 3)  ! Codeine  Anticoagulation Management History:      The patient is taking warfarin and comes in today for a routine follow up visit.  Negative risk factors for bleeding include an age less than 24 years old.  The bleeding index is 'low risk'.  Negative CHADS2 values include Age > 75 years old.  Her last INR was 3.6 ratio.  Anticoagulation responsible provider: Riley Kill MD, Maisie Fus.  INR POC: 2.5.  Cuvette Lot#: [id number].  Exp: 08/2011.    Anticoagulation Management Assessment/Plan:      The patient's current anticoagulation dose is Warfarin sodium 5 mg tabs: Use as directed by anticoagulation clinic/ 7.5 Mon, Wed, Fri 5 mg all other days.  The target INR is 2.0-3.0.  The next INR is due 12/07/2010.  Anticoagulation instructions were given to patient.  Results were reviewed/authorized by Leota Sauers, PharmD, BCPS, CPP.         Prior Anticoagulation Instructions: INR 1.5  Take 2 tablets today. Then begin taking 1  tablet everday except 1 and 1/2 tablets on Mondays, Wednesdays, and Fridays. Recheck INR in 2 weeks.   Current Anticoagulation Instructions: INR 2.5  Coumadin 5mg  tabs 1.5 tab on MON, WED,FRI 1 tab all other days

## 2010-11-17 NOTE — Assessment & Plan Note (Addendum)
Summary: Cardiology Nuclear Testing  Nuclear Med Background Indications for Stress Test: Evaluation for Ischemia   History: Ablation, Cardioversion, Echo  History Comments: symtomatic PVC's/VT/A-Fib ; 7/11 Echo EF= 55-60%  Symptoms: Chest Pain, Chest Pressure, DOE, Fatigue, Light-Headedness, Palpitations    Nuclear Pre-Procedure Cardiac Risk Factors: RBBB Caffeine/Decaff Intake: None NPO After: 11:30 PM Lungs: clear IV 0.9% NS with Angio Cath: 22g     IV Site: R Wrist IV Started by: Stanton Kidney, EMT-P Chest Size (in) 38     Cup Size C     Height (in): 69 Weight (lb): 231 BMI: 34.24 Tech Comments: Nadolol held > 36 hours, per patient.  Nuclear Med Study 1 or 2 day study:  1 day     Stress Test Type:  Stress Reading MD:  Cassell Clement, MD     Referring MD:  Hillis Range, MD Resting Radionuclide:  Technetium 1m Tetrofosmin     Resting Radionuclide Dose:  11 mCi  Stress Radionuclide:  Technetium 18m Tetrofosmin     Stress Radionuclide Dose:  33 mCi   Stress Protocol Exercise Time (min):  5:00 min     Max HR:  157 bpm     Predicted Max HR:  166 bpm  Max Systolic BP: 205 mm Hg     Percent Max HR:  94.58 %     METS: 6.9 Rate Pressure Product:  08657    Stress Test Technologist:  Milana Na, EMT-P     Nuclear Technologist:  Domenic Polite, CNMT  Rest Procedure  Myocardial perfusion imaging was performed at rest 45 minutes following the intravenous administration of Technetium 39m Tetrofosmin.  Stress Procedure  The patient exercised for 5:00. The patient stopped due to fatigue and denied any chest pain.  There were + significant ST-T wave changes.  Technetium 41m Tetrofosmin was injected at peak exercise and myocardial perfusion imaging was performed after a brief delay.  QPS Raw Data Images:  Normal; no motion artifact; normal heart/lung ratio. Stress Images:  Normal homogeneous uptake in all areas of the myocardium. Rest Images:  Normal homogeneous uptake in all  areas of the myocardium. Subtraction (SDS):  Normal Transient Ischemic Dilatation:  1.04  (Normal <1.22)  Lung/Heart Ratio:  .30  (Normal <0.45)  Quantitative Gated Spect Images QGS EDV:  99 ml QGS ESV:  42 ml QGS EF:  58 % QGS cine images:  Normal wall motion.  Findings Normal nuclear study      Overall Impression  Exercise Capacity: Poor exercise capacity. BP Response: Hypertensive blood pressure response. Clinical Symptoms: No chest pain ECG Impression: No significant ST segment change suggestive of ischemia. Overall Impression: Normal stress nuclear study.  Appended Document: Cardiology Nuclear Testing please infomr pt of normal study  Appended Document: Cardiology Nuclear Testing lmom for pt with normal results

## 2010-11-17 NOTE — Progress Notes (Signed)
Summary: Nuclear Pre-Procedure  Phone Note Outgoing Call Call back at [facility] Phone [phone]   Call placed by: Stanton Kidney, EMT-P,  November 10, 2010 2:27 PM Action Taken: Phone Call Completed Reason for Call: Confirm/change Appt Summary of Call: Left message with information on Myoview Information Sheet (see scanned document for details). Stanton Kidney, EMT-P  November 10, 2010 2:27 PM      Nuclear Med Background Indications for Stress Test: Evaluation for Ischemia   History: Ablation, Cardioversion, Echo  History Comments: symtomatic PVC's/VT/A-Fib ; 7/11 Echo EF= 55-60%  Symptoms: Chest Pain, Chest Pressure, Light-Headedness, Palpitations    Nuclear Pre-Procedure Cardiac Risk Factors: RBBB Height (in): 69 Tech Comments: IRBBB  Nuclear Med Study Referring MD:  Hillis Range, MD

## 2010-12-07 ENCOUNTER — Ambulatory Visit (INDEPENDENT_AMBULATORY_CARE_PROVIDER_SITE_OTHER): Payer: 59 | Admitting: *Deleted

## 2010-12-07 DIAGNOSIS — I82409 Acute embolism and thrombosis of unspecified deep veins of unspecified lower extremity: Secondary | ICD-10-CM

## 2010-12-07 DIAGNOSIS — I2699 Other pulmonary embolism without acute cor pulmonale: Secondary | ICD-10-CM

## 2010-12-07 DIAGNOSIS — Z7901 Long term (current) use of anticoagulants: Secondary | ICD-10-CM

## 2010-12-07 DIAGNOSIS — I4891 Unspecified atrial fibrillation: Secondary | ICD-10-CM

## 2010-12-07 LAB — POCT INR: INR: 3.1

## 2010-12-10 LAB — PROTIME-INR
INR: 2.2 — ABNORMAL HIGH (ref 0.00–1.49)
Prothrombin Time: 26.1 seconds — ABNORMAL HIGH (ref 11.6–15.2)

## 2010-12-21 ENCOUNTER — Ambulatory Visit (INDEPENDENT_AMBULATORY_CARE_PROVIDER_SITE_OTHER): Payer: 59 | Admitting: *Deleted

## 2010-12-21 DIAGNOSIS — I4891 Unspecified atrial fibrillation: Secondary | ICD-10-CM

## 2010-12-21 DIAGNOSIS — I82409 Acute embolism and thrombosis of unspecified deep veins of unspecified lower extremity: Secondary | ICD-10-CM

## 2010-12-21 DIAGNOSIS — I2699 Other pulmonary embolism without acute cor pulmonale: Secondary | ICD-10-CM

## 2010-12-21 LAB — POCT INR: INR: 3.8

## 2011-01-05 ENCOUNTER — Encounter: Payer: Self-pay | Admitting: Internal Medicine

## 2011-01-05 ENCOUNTER — Telehealth: Payer: Self-pay | Admitting: Internal Medicine

## 2011-01-05 MED ORDER — NADOLOL 20 MG PO TABS: 20.0000 mg | ORAL_TABLET | Freq: Every day | ORAL | Status: DC

## 2011-01-05 NOTE — Telephone Encounter (Signed)
Pt nadolol 20mg  once a day # 90 to be called [phone]

## 2011-01-11 ENCOUNTER — Encounter: Payer: Self-pay | Admitting: Internal Medicine

## 2011-01-11 ENCOUNTER — Ambulatory Visit (INDEPENDENT_AMBULATORY_CARE_PROVIDER_SITE_OTHER): Payer: 59 | Admitting: Internal Medicine

## 2011-01-11 ENCOUNTER — Ambulatory Visit (INDEPENDENT_AMBULATORY_CARE_PROVIDER_SITE_OTHER): Payer: 59 | Admitting: *Deleted

## 2011-01-11 DIAGNOSIS — I4949 Other premature depolarization: Secondary | ICD-10-CM

## 2011-01-11 DIAGNOSIS — I4891 Unspecified atrial fibrillation: Secondary | ICD-10-CM

## 2011-01-11 DIAGNOSIS — R0789 Other chest pain: Secondary | ICD-10-CM

## 2011-01-11 DIAGNOSIS — I2699 Other pulmonary embolism without acute cor pulmonale: Secondary | ICD-10-CM

## 2011-01-11 DIAGNOSIS — I82409 Acute embolism and thrombosis of unspecified deep veins of unspecified lower extremity: Secondary | ICD-10-CM

## 2011-01-11 LAB — POCT INR: INR: 2.6

## 2011-01-11 NOTE — Progress Notes (Signed)
The patient presents today for routine electrophysiology followup.  Since last being seen in our clinic, the patient reports doing very well.  She continues to have occasional episodes of midsternal "ache" lasting several minutes as well as L hand "numbness".  This occurs at rest and has occurred intermittently "for years".  She denies any exertional symptoms.  She denies any prolonged episodes of afib since her last visit but thinks she may have been in afib for several minutes on a single episode. Today, she denies symptoms of palpitations,shortness of breath, orthopnea, PND, lower extremity edema, dizziness, presyncope, syncope, or neurologic sequela.  The patient feels that she is tolerating medications without difficulties and is otherwise without complaint today.   Past Medical History  Diagnosis Date  . Symptomatic premature ventricular contractions     improved s/p ablation  . Paroxysmal atrial fibrillation   . PE (pulmonary embolism)     chronically anticoagulated with coumadin  . DVT (deep venous thrombosis)     recurrent; chronically anticoagulated with coumadin  . Strep throat at age 22    The patient reports a severe strep throat infection at age 55 and   is not clear as to whether or not she may have had rheumatic fever.  Marland Kitchen Uterine bleeding 2008    Uterine artery embolization in 2008 for uterine bleeding.  Marland Kitchen Atypical chest pain     normal myoview 11/11/10   Past Surgical History  Procedure Date  . Uterine artery embolization 2008    for uterine bleeding  . Pvc ablation 2010  . Diagnostic d&c hysteroscopy and novasure ablation 03/17/2004    Current Outpatient Prescriptions  Medication Sig Dispense Refill  . nadolol (CORGARD) 20 MG tablet Take 1 tablet (20 mg total) by mouth daily.  30 tablet  11  . warfarin (COUMADIN) 5 MG tablet Take by mouth as directed.          Allergies  Allergen Reactions  . Codeine   . Methylprednisolone     REACTION: AF at end of medrol dose  pack  . Sulfonamide Derivatives     History   Social History  . Marital Status: Single    Spouse Name: N/A    Number of Children: N/A  . Years of Education: N/A   Occupational History  . Nurse    Social History Main Topics  . Smoking status: Never Smoker   . Smokeless tobacco: Not on file  . Alcohol Use: Yes     rarely  . Drug Use: No  . Sexually Active: Not on file   Other Topics Concern  . Not on file   Social History Narrative   Works as a Engineer, civil (consulting) at [employer]    Family History  Problem Relation Age of Onset  . Hypertension Mother   . Diabetes Mother    Physical Exam: Filed Vitals:   01/11/11 0905  BP: 108/80  Pulse: 64  Height: 5\' 9"  (1.753 m)  Weight: 228 lb (103.42 kg)    GEN- The patient is well appearing, alert and oriented x 3 today.   Head- normocephalic, atraumatic Eyes-  Sclera clear, conjunctiva pink Ears- hearing intact Oropharynx- clear Neck- supple, no JVP Lymph- no cervical lymphadenopathy Lungs- Clear to ausculation bilaterally, normal work of breathing Heart- Regular rate and rhythm, no murmurs, rubs or gallops, PMI not laterally displaced GI- soft, NT, ND, + BS Extremities- no clubbing, cyanosis, or edema MS- no significant deformity or atrophy Skin- no rash or lesion Psych- euthymic  mood, full affect Neuro- strength and sensation are intact  EKG today reveals RsR', otherwise normal ekg  Myoview from 3/12 reviewed with patient.  Assessment and Plan:

## 2011-01-11 NOTE — Assessment & Plan Note (Signed)
Doing very well s/p ablation

## 2011-01-11 NOTE — Patient Instructions (Signed)
Your physician wants you to follow-up in: 6 months with Dr. Allred. You will receive a reminder letter in the mail two months in advance. If you don't receive a letter, please call our office to schedule the follow-up appointment.  

## 2011-01-11 NOTE — Assessment & Plan Note (Signed)
Stable Given infrequent nature, I would avoid daily AAD Continue coumadin and nadolol Consider propafenone as a pill in pocket (600mg  po x 1) option for sustained afib.  She has not tolerated flecainide in the past.

## 2011-01-11 NOTE — Assessment & Plan Note (Signed)
Stable Recent normal myoview and atypical features are suggestive of noncardiac chest pain. Continue nadolol.  Follow up with PCP for further evaluation and consideration of tx for GERD.

## 2011-01-12 NOTE — Discharge Summary (Signed)
Pamela Davenport, Pamela Davenport               ACCOUNT NO.:  [id number]   MEDICAL RECORD NO.:  [id number]          PATIENT TYPE:  INP   LOCATION:  4743                         FACILITY:  [facility]   PHYSICIAN:  Hillis Range, MD       DATE OF BIRTH:  [birth year]   DATE OF ADMISSION:  11/04/2008  DATE OF DISCHARGE:  11/05/2008                               DISCHARGE SUMMARY   ALLERGIES:  This patient has allergies to CODEINE which causes nausea  and SULFA which causes itching, TAMBOCOR and SOLU-MEDROL.   TIME SPENT:  Greater than 35 minutes.   FINAL DIAGNOSES:  1. 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 Paroxysmal atrial fibrillation.   SECONDARY DIAGNOSES:  1. History of pulmonary embolism.  2. Recurrent deep venous thrombosis, on chronic Coumadin.  3. Uterine artery embolization for uterine bleeding.   PROCEDURES:  1. November 04, 2008, electrophysiology study:  Radiofrequency catheter      ablation of right ventricular outflow tract ventricular tachycardia      with ablation of an anteroseptal right ventricular outflow tract      ventricular tachycardia by Dr. Hillis Range.  2. The patient had an echocardiogram on August 02, 2008.  Study      showed ejection fraction 60-65%, no significant valve disease,      trace tricuspid regurgitation, mild mitral regurgitation, no      pulmonic valve regurgitation, and no aortic regurgitation.   BRIEF HISTORY:  Pamela Davenport is a 55 year old female.  She has had  symptomatic premature ventricular tachycardias.  Her symptoms are  dyspnea, anxiety, chest fullness, and dizziness.   She has a longstanding history of both atrial and ventricular  arrhythmias.  She estimates her problems began in this manner in the  1980s.  She presented with the symptoms of dyspnea and anxiety and was  found to have atrial fibrillation, rapid ventricular rates.  Over the  next 10 years, she has had 4-5 symptoms of paroxysmal atrial  fibrillation.   She has been treated with multiple medications for this  including diltiazem, digoxin, and flecainide.  She feels that her most  recent episode of AFib was 2002.  She converted to sinus rhythm on a  Cardizem drip and then was discharged.  She notes that several hours  later she developed a very different subjective palpitation.  She  returned the hospital and she was found to have sinus rhythm with  ventricular bigeminy.   Since that time, she has had increasing frequency and duration of  episodic palpitations.  They are attributed to frequent premature  ventricular contractions.  She has tried multiple medications for this  including Inderal, verapamil, Cardizem, and now she is on Bystolic.  She  continues to have symptomatic PVCs despite medical therapy.  She notes  that episodes of palpitations, chest pressure, and breathlessness are  worse with eating and they are worse with anxiety.  She is referred for  EP consultation by Dr. Elsie Lincoln.   Therapeutic strategies for symptomatic PVCs were just discussed in  detail.  They include electrophysiology study, radiofrequency catheter  ablation of the PVC focus.  The patient wishes to proceed with catheter  ablation and this will be scheduled for the next available time.  Risks  and benefits have been described to the patient and she is wishing to  proceed.   HOSPITAL COURSE:  The patient presented electively on November 04, 2008.  She underwent electrophysiology study with exhaustive mapping procedure  with a finding of RVOT VT focus.  This was successfully ablated along  the anteroseptal right ventricular outflow tract at the level of  pulmonic valve.  The patient has had no PVCs, NSVT, or VT after  ablation, despite Isuprel challenge.  No early apparent complications  such as hematoma or perforation of tamponade.  The patient discharging  postprocedure day #1.  She is able to hold Bystolic which she has  indicated to restart this unless she  has recurrent palpitations, then  she may restart this.   DISCHARGE MEDICATIONS:  1. Coumadin 5 mg 1-1/2 tablets daily except 5 mg on Monday.  2. Ampicillin 500 mg 3 times daily for upper respiratory infection for      the next 2 days.   FOLLOWUP:  She follows up with [facility], [address], [place] to see Dr. Johney Frame on Monday, December 09, 2008 at 9:45.   LABORATORY DATA:  Laboratory studies pertinent to this admission were  drawn October 28, 2008.  White cells 7.3, hemoglobin 12.2, hematocrit 37,  and platelets 213.  Protime 36.2 and INR 3.6.  Sodium 141, potassium  4.4, chloride 105, carbonate 30, glucose 99, BUN 17, and creatinine 0.9.      Maple Mirza, [place]      Hillis Range, MD  Electronically Signed    GM/MEDQ  D:  11/05/2008  T:  11/06/2008  Job:  [id number]   cc:   [name], M.D.  Merlene Laughter. Renae Gloss, M.D.

## 2011-01-12 NOTE — Op Note (Signed)
NAMESHAUNTA, ONCALE               ACCOUNT NO.:  [id number]   MEDICAL RECORD NO.:  [id number]          PATIENT TYPE:  INP   LOCATION:  4743                         FACILITY:  [facility]   PHYSICIAN:  Hillis Range, MD       DATE OF BIRTH:  [birth year]   DATE OF PROCEDURE:  11/04/2008  DATE OF DISCHARGE:                               OPERATIVE REPORT   PREPROCEDURE DIAGNOSES:  Premature ventricular contractions and  nonsustained ventricular tachycardia.   POSTPROCEDURE DIAGNOSES:  Premature ventricular contractions and  nonsustained ventricular tachycardia.   PROCEDURES:  1. Comprehensive electrophysiologic study.  2. Coronary sinus pacing and recording.  3. Three-dimensional mapping of ventricular tachycardia.  4. Radiofrequency ablation of ventricular tachycardia.  5. Pacing from multiple sites within the right ventricle.  6. Isoproterenol infusion with arrhythmia induction.   INTRODUCTION:  Ms. Digman is a pleasant 55 year old female with a  history of very symptomatic premature ventricular contractions and  nonsustained ventricular tachycardia.  She reports that she has had  palpitations for several years.  She was previously evaluated by Dr.  Elsie Lincoln and has failed medical therapy with Inderal, verapamil, Cardizem,  and most recently Bystolic.  She now presents for EP study and  radiofrequency ablation.   DESCRIPTION OF PROCEDURE:  Informed written consent was obtained, and  the patient was brought to the electrophysiology lab in the fasting  state.  She was adequately sedated with intravenous Versed and fentanyl  as outlined in the nursing report.  The patient's right groin was  prepped and draped in the usual sterile fashion by the EP lab staff.  Lidocaine was infiltrated into the right common femoral region for local  analgesia.  Using a modified percutaneous Seldinger technique, two 6 and  one 8-French hemostasis sheaths were placed into the right common  femoral vein.  A  6-French decapolar Polaris X catheter was introduced  through the right common femoral vein and advanced into the coronary  sinus for pacing and recording from this location.  Two 6-French  quadripolar Josephson catheters were introduced through the right common  femoral vein and advanced into the His bundle and right ventricular apex  positions respectively.  The patient presented to the electrophysiology  lab in normal sinus rhythm.  Her PR interval measured 181 milliseconds  with a QRS duration of 100 milliseconds and a QT interval 430  milliseconds.  The RR interval measured 810 milliseconds.  The AH  interval was 137 milliseconds with an HV interval of 37 milliseconds.  The patient was observed to have very frequent premature ventricular  contractions and nonsustained ventricular tachycardia of a single  morphology.  The QRS morphology of the focus was of a left bundle-branch  inferior axis with a QRS transition at V3.  The QRS duration was 187  milliseconds.  Pacing was performed from the right ventricle which  significantly increased the frequency of the PVCs during pacing.  The VA  Wenckebach cycle length was 430 milliseconds and the earliest retrograde  atrial activation during ventricular pacing was recorded from the His  electrode.  This was felt to represent  concentric midline VA conduction.  Programmed extrastimulus testing was performed from the right  ventricular apex, however, due to the significant increase in ectopy  which was of the previously described QRS morphology, it was very  difficult to tell if the patient had continuous retrograde conduction.  The conduction was, however, clearly midline and decremental.  The  retrograde AV nodal ERP was 500/360 milliseconds.  Rapid atrial pacing  was performed with no significant increase in the PVC frequency during  atrial pacing.  There was no evidence of PR greater than RR and no  tachycardias were induced.  The AV  Wenckebach cycle length was 400  milliseconds.  Atrial extrastimulus testing was performed which revealed  no AH jumps or echo beats.  Tachycardia was not induced.  The AV nodal  ERP was 500/400 milliseconds.  Mapping of the ventricular tachycardia  focus was then performed using a 7-French Biosense Webster EZ Steer 4-mm  ablation catheter.  The ablation catheter was advanced into the right  ventricle, and the earliest activation preceding the QRS of the VT focus  was clearly within the right ventricular outflow tract.  Extensive 3-  dimensional mapping of the right ventricular outflow tract was performed  using the Carto mapping system.  The earliest activation preceding a PVC  was observed to arise from the anteroseptal right ventricular outflow  tract just beneath the pulmonic valve.  In this location, the activation  recorded from the distal ablation electrode preceded the surface QRS at  41 milliseconds and a QRS morphology was observed from an unipolar  electrode.  Pace mapping was also performed within the right ventricular  outflow tract and in this particular position, a 12 out of 12 pace map  match was observed.  Radiofrequency current was delivered in this  location with a target temperature of 50 watts at 30 degrees for 60  seconds.  Unfortunately due to the location of the lesions directly  beneath the pulmonic valve only 10 watts of power could be achieved in  this location.  Two additional radiofrequency applications were also  delivered in this location as bonus radiofrequency applications, which  significantly reduced the PVC frequency to less than 1 per minute.  Isoproterenol was infused at 2 mcg per minute, and no significant  increase in PVCs was observed.  Pacing was performed from the right  ventricular apex during isoproterenol infusion with only rare PVCs  observed.  Isoproterenol was allowed to wash out and the patient was  observed to have a slight increase in the  frequency of the PVCs to 2 or  3 per minute.  The ablation catheter was therefore again placed within  the right ventricular outflow tract at the anteroseptal location just  beneath the pulmonic valve.  The ablation catheter was advanced  approximately 1 mm to the level of the pulmonic valve and pace mapping  was performed which again revealed a 12 out of 12 perfect pace map  match.  Two additional radiofrequency applications were delivered in  this location with a target temperature 50 degrees at 30 watts.  A 25  watts of power could be delivered in this location.  Following the two  separate 60-second bonus radiofrequency applications, the patient was  observed for an additional 15 minutes with no further PVCs observed.  Following ablation, pacing was performed from the right ventricular apex  with no PVCs observed.  The VA Wenckebach cycle length was 540  milliseconds with no tachycardias induced.  Programmed extrastimulus  testing was now performed from the right ventricular apex which revealed  midline concentric decremental VA conduction.  There was no clear  retrograde AH jump though the VA conduction time was noted to be quite  long.  There were no echo beats and no tachycardias induced.  There was  not a single PVC during ventricular pacing.  The retrograde AV nodal ERP  was 550/250 milliseconds.  The procedure was therefore considered  completed.  All catheters were removed, and the sheaths were aspirated  and flushed.  The sheaths were removed and hemostasis was assured.  There were no early apparent complications.  A limited bedside  transthoracic echocardiogram revealed no pericardial effusion.   CONCLUSIONS:  1. Frequent premature ventricular contractions and nonsustained      ventricular tachycardia of a left bundle-branch inferior axis      successfully ablated at the level of the pulmonic valve within the      right ventricular outflow tract along the anteroseptal  portion.  2. No further ventricular arrhythmias following ablation both on and      off isoproterenol with rapid ventricular pacing.  3. No evidence of dual AV nodal physiology or accessory pathways with      no supraventricular tachycardias induced.  4. No early apparent complications.      Hillis Range, MD  Electronically Signed     JA/MEDQ  D:  11/04/2008  T:  11/04/2008  Job:  [id number]   cc:   [name], M.D.

## 2011-01-12 NOTE — Letter (Signed)
September 13, 2008    Pamela Davenport, M.D.  [phone], [address]  [place], [place]  [address]   RE:  Pamela Davenport, Pamela Davenport  MRN:  [id number]  /  DOB:  1956/06/02   Dear Dr. Elsie Lincoln:   It was my pleasure to see your patient, Pamela Davenport, in  Electrophysiology consultation today.  As you recall, she is a very  pleasant 55 year old female with symptomatic premature ventricular  contractions.  She has a longstanding history of atrial and ventricular  arrhythmias.  She estimates that her problems began in the early 1980s  when she developed symptoms of dyspnea and anxiousness with associated  chest fullness.  She presented and was found to have atrial fibrillation  with rapid ventricular rates.  She reports that over the next 10 years  or so, she had 4 or 5 episodes of symptomatic atrial fibrillation with  rapid ventricular rates for which she was treated with multiple  medications including diltiazem, digoxin, and flecainide.  She  retrospectively feels that her most recent episode of atrial  fibrillation was in 2002, at which time, she presented to the hospital  with atrial fibrillation with rapid ventricular rates.  She converted to  sinus rhythm with a Cardizem drip and was discharged.  She notes that  several hours later, she developed a very different subjective  palpitation.  She returned to the hospital and was found to have sinus  rhythm with ventricular bigeminy.  Since that time, she has had  increasing frequency and duration of episodic palpitations which have  been attributed to frequent premature ventricular contractions.  She has  been tried with multiple medications for her PVCs including Inderal,  verapamil, Cardizem, and most recently Bystolic.  She continues to have  symptomatic PVCs despite increasing doses of Bystolic.  She reports  symptoms of palpitations, chest pressure, and brief breathlessness.  She  notes that these episodes are worse with eating and with anxiety.   She  has found no fractures which have remedied her symptoms.  She also  reports occasional rapid palpitations which she feels are nonsustained  ventricular tachycardia which are associated with dizziness and  shortness of breath.  She denies sustained chest discomfort, presyncope,  or syncope.  She is now referred for EP consultation.   PAST MEDICAL HISTORY:  1. Symptomatic premature ventricular contractions.  2. Remote paroxysmal atrial fibrillation.  3. Pulmonary embolus previously.  4. Recurrent deep venous thrombosis, chronically anticoagulated with      Coumadin.  5. The patient reports a severe strep throat infection at age 29 and      is not clear as to whether or not she may have had rheumatic fever.  6. Uterine artery embolization in 2008 for uterine bleeding.   ALLERGIES:  Sulfa causes itching, codeine causes nausea, and Solu-Medrol  as previously caused atrial fibrillation.   CURRENT MEDICATIONS:  1. Bystolic 30 mg daily.  2. Coumadin to maintain an INR between 2 and 3.   SOCIAL HISTORY:  The patient lives in [place] with her son and  brother.  She works as a Engineer, civil (consulting) for [employer].  She denies tobacco  and rarely drinks alcohol.  She denies drug use.   FAMILY HISTORY:  She is unaware of any familial clotting disorders.  Her  mother had hypertension and diabetes.  Her father died suddenly at age  59 during surgery for appendectomy.   REVIEW OF SYSTEMS:  All systems were reviewed and negative except as  outlined in the HPI above.   PHYSICAL EXAMINATION:  VITAL SIGNS:  Blood pressure 140/70, heart rate  76, respirations 18, and weight 222 pounds.  GENERAL:  The patient is a well-appearing female in no acute distress.  She is alert and oriented x3.  HEENT:  Normocephalic and atraumatic.  Sclerae are clear.  Conjunctivae  pink.  Oropharynx clear.  NECK:  Supple.  No JVD, thyromegaly, lymphadenopathy, or bruits.  LUNGS:  Clear to auscultation bilaterally.   HEART:  Regular rate and rhythm with frequent ectopy.  No murmurs, rubs,  or gallops.  GI:  Soft, nontender, and nondistended.  Positive bowel sounds.  EXTREMITIES:  No clubbing, cyanosis, or edema.  NEUROLOGIC:  Strength and sensation are intact.  SKIN:  No ecchymosis or lacerations.  MUSCULOSKELETAL:  No deformity or atrophy.  PSYCH:  Euthymic mood.  Full affect.   EKG today reveals normal sinus rhythm at 70 beats per minute with an  incomplete right bundle-branch block pattern.  A rhythm strip  demonstrates frequent premature ventricular contractions which are of a  left bundle-branch, right inferior axis morphology with a precordial  transition at V3.  The QRS duration of the PVC is 185 msec.  This seems  to be the only PVC morphology clinically present today.   IMPRESSION:  [name] is a very pleasant 55 year old female with a  history of paroxysmal atrial fibrillation and symptomatic premature  ventricular contractions who presents today for EP consultation.  She  feels that symptomatically, her atrial fibrillation has not been an  issue since 2002.  She is, however, very symptomatic with premature  ventricular contractions.  Based on electrocardiographic review today,  it appears that she has a single premature ventricular contraction  morphology which arises from the region of the outflow tracts.  I  suspect that this premature ventricular contraction focus should be  amenable to ablation.  She has failed medical therapy with calcium  channel blockers, beta-blockers, digoxin, and flecainide.   PLAN:  Therapeutic strategies for symptomatic premature ventricular  contractions were discussed in detail with the patient today including  both, medicine and catheter-based therapies.  Risks, benefits, and  alternatives to EP study and radiofrequency ablation of the PVC focus  were discussed.  The patient wishes to proceed with catheter ablation of  the PVC focus.  I therefore  think that we should plan for PVC focus  ablation at the next available time.  Given the patient's history of  recurrent DVT, I think that we should continue her Coumadin  periprocedurally.  I would, however, have the patient hold her Bystolic  for 48 hours prior to the ablation.   Thank you for the opportunity of participating in the care of Ms.  Schlitt.  Please feel free to contact me if you wish to discuss her care  further.    Sincerely,      Hillis Range, MD  Electronically Signed    JA/MedQ  DD: 09/13/2008  DT: 09/14/2008  Job #: [id number]   CC:    Merlene Laughter. Renae Gloss, M.D.

## 2011-01-15 NOTE — Op Note (Signed)
NAME:  Pamela Davenport, Pamela Davenport                         ACCOUNT NO.:  [id number]   MEDICAL RECORD NO.:  [id number]                   PATIENT TYPE:  AMB   LOCATION:  [facility]                                  FACILITY:  [facility]   PHYSICIAN:  Kathreen Cosier, M.D.           DATE OF BIRTH:  1956/05/17   DATE OF PROCEDURE:  03/17/2004  DATE OF DISCHARGE:                                 OPERATIVE REPORT   PREOPERATIVE DIAGNOSES:  Dysfunctional uterine bleeding.   POSTOPERATIVE DIAGNOSES:  Dysfunctional uterine bleeding.   PROCEDURE:  Diagnostic D&C hysteroscopy and Novasure ablation.   Under general anesthesia the patient was in the lithotomy position, perineum  and vagina prepped and draped, bladder entered with a straight catheter.  Bimanual exam revealed the uterus to be top normal size, negative adnexa.  Speculum placed in the vagina, anterior lip of the cervix grasped with a  tenaculum.  The endocervix was curetted, a small amount of tissue obtained,  endometrial cavity sounded to 10 cm.  Using a #7 Hegar dilated the cervix,  the length was measured to be 5 cm.  The cervix dilated to a #25 Shawnie Pons and  then the diagnostic hysteroscope was inserted. The cavity was normal except  a small myoma posteriorly.  The procedure was terminated and a sharp  curettage performed. A large amount of tissue was obtained. The Novasure was  then inserted and the cavity was 3.5 cm.  Cavity integrity was tested with  carbon dioxide and noted to be intact. Novasure ablation was done for 1  minute and 28 seconds at 96 watts.  After the ablation, hysteroscopy was  repeated and the cavity was noted to be totally ablated. __________ pressure  was 70 mmHg, fluid deficit was 50 mL.  The patient tolerated the procedure  well and was taken to the recovery room in good condition.                                               Kathreen Cosier, M.D.    BAM/MEDQ  D:  03/17/2004  T:  03/17/2004  Job:  [id number]

## 2011-01-15 NOTE — Discharge Summary (Signed)
[place]  Patient:    Pamela Davenport, Pamela Davenport                      MRN: [id number] Adm. Date:  [id number] Disc. Date: [id number] Attending:  Ophelia Shoulder Dictator:   Darcella Gasman. Annie Paras, F.N.P.C. CC:         Sharyn Dross., M.D.  Merlene Laughter. Renae Gloss, M.D.   Discharge Summary  DISCHARGE DIAGNOSES: 1. Atrial fibrillation, resolved. 2. Anemia, chronic and blood loss. 3. Mitral valve prolapse. 4. Sickle cell trait. 5. History of deep vein thrombosis secondary to oral contraceptives.  DISCHARGE CONDITION:  Improved.  DISCHARGE MEDICATIONS: 1. Propranolol LA 160 mg daily. 2. Niferex 150 mg one twice a day. 3. Folic acid 1 mg daily. 4. Aspirin daily.  DISCHARGE INSTRUCTIONS: 1. No work until hemoglobin is greater than 9. 2. Eat lots of leafy green vegetables and meat. 3. Have CBC drawn next Monday. 4. Call the office and ask for an appointment in two weeks with Nada Boozer.  HISTORY OF PRESENT ILLNESS:  A 55 year old black female with a history of paroxysmal atrial fibrillation since 1979, hypertension, chronic anemia, DVT, and PE, January 2001, stopping Coumadin June of 2001, presented to the emergency room May 21, 2000, with an arrhythmia that began about 8 p.m. that night.  She had no chest pain, no shortness of breath, just irregular heart rate.  Her last atrial fibrillation episode was four years ago.  She had tried Sotalol and flecainide without results.  No syncope or presyncope.  Her pressure had been up lately and she had been started on Maxzide.  Her pressure came down to 90 systolic with that and it was stopped.  OUTPATIENT MEDICATIONS: 1. Inderal LA 120 daily. 2. Aspirin 325 mg daily.  ALLERGIES:  SULFA, CODEINE which causes an itch.  MEDICINE INTOLERANCES:  FLECAINIDE and SOTALOL.  For social history, family history, and review of systems see history and physical.  PHYSICAL EXAMINATION AT DISCHARGE:  VITAL  SIGNS:  Her vital signs at the time of discharge are not currently in the chart.  The day prior, blood pressure range 120/64, pulse 80, respirations 20, and temperature 97.7 with room air oxygen saturations 100%.  HEART/LUNGS:  Clear with a chronic mid systolic click.  LABORATORY DATA:  On admission hemoglobin was 9.6, hematocrit 30, WBC 11.3, MCV 69.7, platelets 432.  Hemoglobin dropped to 7.8 on the 23rd with hematocrit of 24, WBC 7.2.  Prior to discharge hemoglobin 8.3, hematocrit 25.3 and WBC 6.2.  Reticulocyte count was 1.5, pro time 13.6, INR of 1.1, PTT 26, on heparin it was therapeutic.  Chemistries:  Sodium 139, potassium 4, chloride 108, CO2 26, glucose 101, BUN 14, creatinine 0.8, calcium 8.8, total protein 6.3, albumin 3.2, AST 24, ALT 14, alk. phos. 66, total bilirubin 0.3.  Cardiac enzymes ranged 125 to 114, MBs were all negative but one and troponin I was 0.03 to less than 0.03.  TSH was 1.78.  She was typed and screened and she was AB negative.  Chest x-ray not currently in the chart.  On admission, the patient was in atrial fibrillation, rate of 75.  Followup on the 22nd again was atrial fibrillation and on the 22nd at 8:34 she converted to a sinus rhythm, rate of 76 with incomplete right bundle-branch block which she maintained.  HOSPITAL COURSE:  Ms. Manthe was admitted by Dr. Jenne Campus on May 21, 2000, in atrial fibrillation.  She was found to be anemic, which is chronic for her and she had not been taking her iron.  She was also menstruating and was felt blood loss was secondary to menstruation.  History as stated, she was admitted and placed on IV heparin and Cardizem drip and late in the evening on the 22nd she converted to a sinus rhythm.  Unable to tolerate Sotalol in the past.  She continued on Inderal LA, we increased the dose and by May 03, 2000, she was ready to discharge home.  She had no dizziness even though her hemoglobin remained low at  8.3, it was felt with iron it would come up. Should be followed closely as an outpatient and should be followed up by Dr. Elsie Lincoln.  She was discharged by Dr. Elsie Lincoln on the 24th. DD:  06/29/00 TD:  06/30/00 Job: [id number]

## 2011-02-08 ENCOUNTER — Ambulatory Visit (INDEPENDENT_AMBULATORY_CARE_PROVIDER_SITE_OTHER): Payer: 59 | Admitting: *Deleted

## 2011-02-08 DIAGNOSIS — I82409 Acute embolism and thrombosis of unspecified deep veins of unspecified lower extremity: Secondary | ICD-10-CM

## 2011-02-08 DIAGNOSIS — I4891 Unspecified atrial fibrillation: Secondary | ICD-10-CM

## 2011-02-08 DIAGNOSIS — I2699 Other pulmonary embolism without acute cor pulmonale: Secondary | ICD-10-CM

## 2011-02-08 LAB — POCT INR: INR: 2.6

## 2011-02-10 ENCOUNTER — Other Ambulatory Visit: Payer: Self-pay | Admitting: Neurosurgery

## 2011-02-10 ENCOUNTER — Telehealth: Payer: Self-pay | Admitting: Internal Medicine

## 2011-02-10 DIAGNOSIS — M5126 Other intervertebral disc displacement, lumbar region: Secondary | ICD-10-CM

## 2011-02-10 NOTE — Telephone Encounter (Addendum)
Pt needs to come off coumadin starting tomorrow and will resume 6-18 and needs inr ck 6-18 before having an injection, I put her down at 845a if it's ok for her to have it done then-pt call [phone] or [phone]

## 2011-02-10 NOTE — Telephone Encounter (Signed)
Patient states this is her 4th injection and Dr Johney Frame gave her the ok to stop on the last 2 injections(08/06/10--see phone note)  She will stop today and restart the night on 02/15/11  Getting INR checked the morning of 02/15/11 at 8:45and the injection at 3:45.

## 2011-02-15 ENCOUNTER — Ambulatory Visit
Admission: RE | Admit: 2011-02-15 | Discharge: 2011-02-15 | Disposition: A | Discharge status: Home / Self Care | Payer: 59 | Source: Ambulatory Visit | Attending: Neurosurgery | Admitting: Neurosurgery

## 2011-02-15 ENCOUNTER — Ambulatory Visit (INDEPENDENT_AMBULATORY_CARE_PROVIDER_SITE_OTHER): Payer: 59 | Admitting: *Deleted

## 2011-02-15 DIAGNOSIS — I2699 Other pulmonary embolism without acute cor pulmonale: Secondary | ICD-10-CM

## 2011-02-15 DIAGNOSIS — I82409 Acute embolism and thrombosis of unspecified deep veins of unspecified lower extremity: Secondary | ICD-10-CM

## 2011-02-15 DIAGNOSIS — I4891 Unspecified atrial fibrillation: Secondary | ICD-10-CM

## 2011-02-15 DIAGNOSIS — M5126 Other intervertebral disc displacement, lumbar region: Secondary | ICD-10-CM

## 2011-02-15 LAB — POCT INR: INR: 1.2

## 2011-02-22 ENCOUNTER — Ambulatory Visit (INDEPENDENT_AMBULATORY_CARE_PROVIDER_SITE_OTHER): Payer: 59 | Admitting: *Deleted

## 2011-02-22 DIAGNOSIS — I4891 Unspecified atrial fibrillation: Secondary | ICD-10-CM

## 2011-02-22 DIAGNOSIS — I82409 Acute embolism and thrombosis of unspecified deep veins of unspecified lower extremity: Secondary | ICD-10-CM

## 2011-02-22 DIAGNOSIS — I2699 Other pulmonary embolism without acute cor pulmonale: Secondary | ICD-10-CM

## 2011-02-22 LAB — POCT INR: INR: 2.2

## 2011-03-08 ENCOUNTER — Encounter: Payer: 59 | Admitting: *Deleted

## 2011-03-12 ENCOUNTER — Other Ambulatory Visit: Payer: Self-pay | Admitting: Gastroenterology

## 2011-03-22 ENCOUNTER — Ambulatory Visit (INDEPENDENT_AMBULATORY_CARE_PROVIDER_SITE_OTHER): Payer: 59 | Admitting: *Deleted

## 2011-03-22 DIAGNOSIS — I4891 Unspecified atrial fibrillation: Secondary | ICD-10-CM

## 2011-03-22 DIAGNOSIS — I2699 Other pulmonary embolism without acute cor pulmonale: Secondary | ICD-10-CM

## 2011-03-22 DIAGNOSIS — I82409 Acute embolism and thrombosis of unspecified deep veins of unspecified lower extremity: Secondary | ICD-10-CM

## 2011-03-22 LAB — POCT INR: INR: 1.6

## 2011-04-01 ENCOUNTER — Other Ambulatory Visit: Payer: Self-pay | Admitting: Internal Medicine

## 2011-04-01 MED ORDER — WARFARIN SODIUM 5 MG PO TABS: ORAL_TABLET | ORAL | Status: DC

## 2011-04-12 ENCOUNTER — Ambulatory Visit (INDEPENDENT_AMBULATORY_CARE_PROVIDER_SITE_OTHER): Payer: 59 | Admitting: *Deleted

## 2011-04-12 DIAGNOSIS — I4891 Unspecified atrial fibrillation: Secondary | ICD-10-CM

## 2011-04-12 DIAGNOSIS — I2699 Other pulmonary embolism without acute cor pulmonale: Secondary | ICD-10-CM

## 2011-04-12 DIAGNOSIS — I82409 Acute embolism and thrombosis of unspecified deep veins of unspecified lower extremity: Secondary | ICD-10-CM

## 2011-04-12 LAB — POCT INR: INR: 2.7

## 2011-05-10 ENCOUNTER — Ambulatory Visit (INDEPENDENT_AMBULATORY_CARE_PROVIDER_SITE_OTHER): Payer: 59 | Admitting: *Deleted

## 2011-05-10 DIAGNOSIS — I82409 Acute embolism and thrombosis of unspecified deep veins of unspecified lower extremity: Secondary | ICD-10-CM

## 2011-05-10 DIAGNOSIS — I4891 Unspecified atrial fibrillation: Secondary | ICD-10-CM

## 2011-05-10 DIAGNOSIS — I2699 Other pulmonary embolism without acute cor pulmonale: Secondary | ICD-10-CM

## 2011-05-10 LAB — POCT INR: INR: 3.3

## 2011-05-24 ENCOUNTER — Ambulatory Visit (INDEPENDENT_AMBULATORY_CARE_PROVIDER_SITE_OTHER): Payer: 59 | Admitting: *Deleted

## 2011-05-24 DIAGNOSIS — I4891 Unspecified atrial fibrillation: Secondary | ICD-10-CM

## 2011-05-24 DIAGNOSIS — I2699 Other pulmonary embolism without acute cor pulmonale: Secondary | ICD-10-CM

## 2011-05-24 DIAGNOSIS — I82409 Acute embolism and thrombosis of unspecified deep veins of unspecified lower extremity: Secondary | ICD-10-CM

## 2011-05-24 LAB — POCT INR: INR: 2

## 2011-05-24 LAB — POCT RAPID STREP A: Streptococcus, Group A Screen (Direct): POSITIVE — AB

## 2011-06-01 ENCOUNTER — Other Ambulatory Visit (HOSPITAL_COMMUNITY): Payer: Self-pay | Admitting: Obstetrics

## 2011-06-01 DIAGNOSIS — Z1231 Encounter for screening mammogram for malignant neoplasm of breast: Secondary | ICD-10-CM

## 2011-06-02 LAB — POCT CARDIAC MARKERS
CKMB, poc: 1.3
Myoglobin, poc: 57.3
Troponin i, poc: <0.05

## 2011-06-02 LAB — POCT I-STAT, CHEM 8
BUN: 13
Calcium, Ion: 1.14
Chloride: 108
Creatinine, Ser: 1.1
Glucose, Bld: 85
HCT: 41
Hemoglobin: 13.9
Potassium: 4.8
Sodium: 139
TCO2: 27

## 2011-06-02 LAB — D-DIMER, QUANTITATIVE (NOT AT ARMC): D-Dimer, Quant: <0.22

## 2011-06-17 ENCOUNTER — Ambulatory Visit (HOSPITAL_COMMUNITY): Payer: 59 | Attending: Obstetrics

## 2011-06-21 ENCOUNTER — Ambulatory Visit (INDEPENDENT_AMBULATORY_CARE_PROVIDER_SITE_OTHER): Payer: 59 | Admitting: *Deleted

## 2011-06-21 DIAGNOSIS — I82409 Acute embolism and thrombosis of unspecified deep veins of unspecified lower extremity: Secondary | ICD-10-CM

## 2011-06-21 DIAGNOSIS — I4891 Unspecified atrial fibrillation: Secondary | ICD-10-CM

## 2011-06-21 DIAGNOSIS — I2699 Other pulmonary embolism without acute cor pulmonale: Secondary | ICD-10-CM

## 2011-06-21 LAB — POCT INR: INR: 2.7

## 2011-07-19 ENCOUNTER — Ambulatory Visit (INDEPENDENT_AMBULATORY_CARE_PROVIDER_SITE_OTHER): Payer: 59 | Admitting: *Deleted

## 2011-07-19 DIAGNOSIS — I2699 Other pulmonary embolism without acute cor pulmonale: Secondary | ICD-10-CM

## 2011-07-19 DIAGNOSIS — I4891 Unspecified atrial fibrillation: Secondary | ICD-10-CM

## 2011-07-19 DIAGNOSIS — I82409 Acute embolism and thrombosis of unspecified deep veins of unspecified lower extremity: Secondary | ICD-10-CM

## 2011-07-19 LAB — POCT INR: INR: 2.9

## 2011-07-19 MED ORDER — WARFARIN SODIUM 5 MG PO TABS: 5.0000 mg | ORAL_TABLET | ORAL | Status: DC

## 2011-08-05 ENCOUNTER — Ambulatory Visit (HOSPITAL_COMMUNITY)
Admission: RE | Admit: 2011-08-05 | Discharge: 2011-08-05 | Disposition: A | Discharge status: Home / Self Care | Payer: 59 | Source: Ambulatory Visit | Attending: Obstetrics | Admitting: Obstetrics

## 2011-08-05 DIAGNOSIS — Z1231 Encounter for screening mammogram for malignant neoplasm of breast: Secondary | ICD-10-CM | POA: Insufficient documentation

## 2011-08-16 ENCOUNTER — Ambulatory Visit (INDEPENDENT_AMBULATORY_CARE_PROVIDER_SITE_OTHER): Payer: 59 | Admitting: *Deleted

## 2011-08-16 DIAGNOSIS — I2699 Other pulmonary embolism without acute cor pulmonale: Secondary | ICD-10-CM

## 2011-08-16 DIAGNOSIS — I82409 Acute embolism and thrombosis of unspecified deep veins of unspecified lower extremity: Secondary | ICD-10-CM

## 2011-08-16 DIAGNOSIS — I4891 Unspecified atrial fibrillation: Secondary | ICD-10-CM

## 2011-08-16 LAB — POCT INR: INR: 2.8

## 2011-09-17 ENCOUNTER — Other Ambulatory Visit (HOSPITAL_COMMUNITY): Payer: Self-pay | Admitting: Neurosurgery

## 2011-09-17 DIAGNOSIS — M4306 Spondylolysis, lumbar region: Secondary | ICD-10-CM

## 2011-09-17 DIAGNOSIS — M545 Low back pain, unspecified: Secondary | ICD-10-CM

## 2011-09-21 ENCOUNTER — Ambulatory Visit (HOSPITAL_COMMUNITY)
Admission: RE | Admit: 2011-09-21 | Discharge: 2011-09-21 | Disposition: A | Discharge status: Home / Self Care | Payer: 59 | Source: Ambulatory Visit | Attending: Neurosurgery | Admitting: Neurosurgery

## 2011-09-21 DIAGNOSIS — M51379 Other intervertebral disc degeneration, lumbosacral region without mention of lumbar back pain or lower extremity pain: Secondary | ICD-10-CM | POA: Insufficient documentation

## 2011-09-21 DIAGNOSIS — M47817 Spondylosis without myelopathy or radiculopathy, lumbosacral region: Secondary | ICD-10-CM | POA: Insufficient documentation

## 2011-09-21 DIAGNOSIS — M5137 Other intervertebral disc degeneration, lumbosacral region: Secondary | ICD-10-CM | POA: Insufficient documentation

## 2011-09-21 DIAGNOSIS — M5126 Other intervertebral disc displacement, lumbar region: Secondary | ICD-10-CM | POA: Insufficient documentation

## 2011-09-21 DIAGNOSIS — M545 Low back pain, unspecified: Secondary | ICD-10-CM

## 2011-09-21 DIAGNOSIS — M4306 Spondylolysis, lumbar region: Secondary | ICD-10-CM

## 2011-09-21 MED ORDER — GADOBENATE DIMEGLUMINE 529 MG/ML IV SOLN
15.0000 mL | Freq: Once | INTRAVENOUS | Status: AC | PRN
  Administered 2011-09-21: 15 mL via INTRAVENOUS

## 2011-09-27 ENCOUNTER — Ambulatory Visit (INDEPENDENT_AMBULATORY_CARE_PROVIDER_SITE_OTHER): Payer: 59 | Admitting: Pharmacist

## 2011-09-27 DIAGNOSIS — I82409 Acute embolism and thrombosis of unspecified deep veins of unspecified lower extremity: Secondary | ICD-10-CM

## 2011-09-27 DIAGNOSIS — I4891 Unspecified atrial fibrillation: Secondary | ICD-10-CM

## 2011-09-27 DIAGNOSIS — I2699 Other pulmonary embolism without acute cor pulmonale: Secondary | ICD-10-CM

## 2011-09-27 LAB — POCT INR: INR: 3.5

## 2011-11-01 ENCOUNTER — Ambulatory Visit (INDEPENDENT_AMBULATORY_CARE_PROVIDER_SITE_OTHER): Payer: 59 | Admitting: *Deleted

## 2011-11-01 DIAGNOSIS — I82409 Acute embolism and thrombosis of unspecified deep veins of unspecified lower extremity: Secondary | ICD-10-CM

## 2011-11-01 DIAGNOSIS — I4891 Unspecified atrial fibrillation: Secondary | ICD-10-CM

## 2011-11-01 DIAGNOSIS — I2699 Other pulmonary embolism without acute cor pulmonale: Secondary | ICD-10-CM

## 2011-11-01 LAB — POCT INR: INR: 3.7

## 2011-11-22 ENCOUNTER — Emergency Department (HOSPITAL_COMMUNITY)
Admission: EM | Admit: 2011-11-22 | Discharge: 2011-11-22 | Disposition: A | Discharge status: Home / Self Care | Payer: 59 | Source: Home / Self Care | Attending: Family Medicine | Admitting: Family Medicine

## 2011-11-22 ENCOUNTER — Encounter (HOSPITAL_COMMUNITY): Payer: Self-pay | Admitting: *Deleted

## 2011-11-22 DIAGNOSIS — J309 Allergic rhinitis, unspecified: Secondary | ICD-10-CM

## 2011-11-22 DIAGNOSIS — J302 Other seasonal allergic rhinitis: Secondary | ICD-10-CM

## 2011-11-22 LAB — POCT RAPID STREP A: Streptococcus, Group A Screen (Direct): NEGATIVE

## 2011-11-22 MED ORDER — CETIRIZINE HCL 10 MG PO TABS: 10.0000 mg | ORAL_TABLET | Freq: Every day | ORAL | Status: DC

## 2011-11-22 MED ORDER — FLUTICASONE PROPIONATE 50 MCG/ACT NA SUSP: 1.0000 | Freq: Two times a day (BID) | NASAL | Status: DC

## 2011-11-22 NOTE — ED Provider Notes (Signed)
History     CSN: [id number]  Arrival date & time 11/22/11  1351   First MD Initiated Contact with Patient 11/22/11 1420      Chief Complaint  Patient presents with  . Sore Throat    (Consider location/radiation/quality/duration/timing/severity/associated sxs/prior treatment) Patient is a 56 y.o. female presenting with pharyngitis. The history is provided by the patient.  Sore Throat This is a new problem. The current episode started 2 days ago. The problem occurs constantly. The problem has been gradually worsening. The symptoms are aggravated by swallowing.    Past Medical History  Diagnosis Date  . Symptomatic premature ventricular contractions     improved s/p ablation  . Paroxysmal atrial fibrillation   . PE (pulmonary embolism)     chronically anticoagulated with coumadin  . DVT (deep venous thrombosis)     recurrent; chronically anticoagulated with coumadin  . Strep throat at age 32    The patient reports a severe strep throat infection at age 30 and   is not clear as to whether or not she may have had rheumatic fever.  Marland Kitchen Uterine bleeding 2008    Uterine artery embolization in 2008 for uterine bleeding.  Marland Kitchen Atypical chest pain     normal myoview 11/11/10    Past Surgical History  Procedure Date  . Uterine artery embolization 2008    for uterine bleeding  . Pvc ablation 2010  . Diagnostic d&c hysteroscopy and novasure ablation 03/17/2004    Family History  Problem Relation Age of Onset  . Hypertension Mother   . Diabetes Mother     History  Substance Use Topics  . Smoking status: Never Smoker   . Smokeless tobacco: Not on file  . Alcohol Use: Yes     rarely    OB History    Grav Para Term Preterm Abortions TAB SAB Ect Mult Living                  Review of Systems  Constitutional: Negative.   HENT: Positive for congestion, sore throat, rhinorrhea and postnasal drip.   Respiratory: Negative.   Cardiovascular: Negative.     Allergies    Codeine; Methylprednisolone; and Sulfonamide derivatives  Home Medications   Current Outpatient Rx  Name Route Sig Dispense Refill  . CETIRIZINE HCL 10 MG PO TABS Oral Take 1 tablet (10 mg total) by mouth daily. 30 tablet 1  . FLUTICASONE PROPIONATE 50 MCG/ACT NA SUSP Nasal Place 1 spray into the nose 2 (two) times daily. Each nostril 1 g 2  . NADOLOL 20 MG PO TABS Oral Take 1 tablet (20 mg total) by mouth daily. 30 tablet 11  . WARFARIN SODIUM 5 MG PO TABS Oral Take 1 tablet (5 mg total) by mouth as directed. By anticoagulation clinic 120 tablet 1    90 day supply    BP 140/85  Pulse 95  Temp(Src) 98.3 F (36.8 C) (Oral)  Resp 18  SpO2 98%  Physical Exam  Nursing note and vitals reviewed. Constitutional: She appears well-developed and well-nourished.  HENT:  Head: Normocephalic.  Right Ear: External ear normal.  Left Ear: External ear normal.  Nose: Mucosal edema and rhinorrhea present.  Mouth/Throat: Posterior oropharyngeal erythema present.    ED Course  Procedures (including critical care time)   Labs Reviewed  POCT RAPID STREP A ([facility]RG CARE ONLY)   No results found.   1. Seasonal allergic rhinitis       MDM  Linna Hoff, MD 11/22/11 1515

## 2011-11-22 NOTE — ED Notes (Signed)
Pt  Reports  Symptoms      Of   sorethroat  Fever  /  Congested            X  2  Days   She  Also  Reports a  Stuffy  Nose       As  Well    -  She  Is  Ambulatory to  Exam  Room and  Appears  In no  Acute  Distress

## 2011-11-29 ENCOUNTER — Ambulatory Visit (INDEPENDENT_AMBULATORY_CARE_PROVIDER_SITE_OTHER): Payer: 59 | Admitting: Pharmacist

## 2011-11-29 DIAGNOSIS — I2699 Other pulmonary embolism without acute cor pulmonale: Secondary | ICD-10-CM

## 2011-11-29 DIAGNOSIS — I4891 Unspecified atrial fibrillation: Secondary | ICD-10-CM

## 2011-11-29 DIAGNOSIS — I82409 Acute embolism and thrombosis of unspecified deep veins of unspecified lower extremity: Secondary | ICD-10-CM

## 2011-11-29 LAB — POCT INR: INR: 2.5

## 2011-11-30 ENCOUNTER — Encounter (HOSPITAL_COMMUNITY): Payer: Self-pay

## 2011-12-01 ENCOUNTER — Other Ambulatory Visit: Payer: Self-pay | Admitting: Obstetrics

## 2011-12-13 ENCOUNTER — Telehealth: Payer: Self-pay | Admitting: Internal Medicine

## 2011-12-13 NOTE — Telephone Encounter (Signed)
New Problem:     Patient called in needing surgical clearance for her procedure coming up and would also like to know when she can stop her coumadin.  Please call back.

## 2011-12-13 NOTE — Telephone Encounter (Signed)
Spoke with patient.  Dr Johney Frame has cleared her for ablation but she will need Lovenox bridge.  Kennon Rounds in [facility] clinic aware.  Patient will start holding Coumadin today.  She will come tomorrow at 10:00am for bridging

## 2011-12-14 ENCOUNTER — Inpatient Hospital Stay (HOSPITAL_COMMUNITY): Admission: RE | Admit: 2011-12-14 | Payer: 59 | Source: Ambulatory Visit

## 2011-12-14 ENCOUNTER — Ambulatory Visit (INDEPENDENT_AMBULATORY_CARE_PROVIDER_SITE_OTHER): Payer: 59 | Admitting: Pharmacist

## 2011-12-14 DIAGNOSIS — I2699 Other pulmonary embolism without acute cor pulmonale: Secondary | ICD-10-CM

## 2011-12-14 DIAGNOSIS — I4891 Unspecified atrial fibrillation: Secondary | ICD-10-CM

## 2011-12-14 DIAGNOSIS — I82409 Acute embolism and thrombosis of unspecified deep veins of unspecified lower extremity: Secondary | ICD-10-CM

## 2011-12-14 LAB — POCT INR: INR: 2.6

## 2011-12-14 MED ORDER — ENOXAPARIN SODIUM 100 MG/ML ~~LOC~~ SOLN: 100.0000 mg | Freq: Two times a day (BID) | SUBCUTANEOUS | Status: DC

## 2011-12-14 NOTE — Patient Instructions (Signed)
4/16- No Coumadin 4/17- Start Lovenox 100mg  in AM and PM 4/18- Lovenox 100mg  in AM only 4/19- Day of procedure.  When okay with MD, restart Coumadin with 1 1/2 tablets x 2 day then continue previous dose of 1 tablet every day except 1 1/2 tablets on Monday and Friday.  Continue Lovenox 100mg  in AM and PM until next appt.  4/25-  Recheck INR

## 2011-12-16 ENCOUNTER — Encounter (HOSPITAL_COMMUNITY): Payer: Self-pay

## 2011-12-16 ENCOUNTER — Other Ambulatory Visit: Payer: Self-pay

## 2011-12-16 ENCOUNTER — Encounter (HOSPITAL_COMMUNITY)
Admission: RE | Admit: 2011-12-16 | Discharge: 2011-12-16 | Disposition: A | Discharge status: Home / Self Care | Payer: 59 | Source: Ambulatory Visit | Attending: Obstetrics | Admitting: Obstetrics

## 2011-12-16 HISTORY — DX: Other specified postprocedural states: Z98.890

## 2011-12-16 HISTORY — DX: Other intervertebral disc displacement, lumbar region: M51.26

## 2011-12-16 HISTORY — DX: Other specified postprocedural states: R11.2

## 2011-12-16 HISTORY — DX: Nausea with vomiting, unspecified: R11.2

## 2011-12-16 LAB — CBC
HCT: 36.8 % (ref 36.0–46.0)
Hemoglobin: 11.8 g/dL — ABNORMAL LOW (ref 12.0–15.0)
MCH: 26.7 pg (ref 26.0–34.0)
MCHC: 32.1 g/dL (ref 30.0–36.0)
MCV: 83.3 fL (ref 78.0–100.0)
Platelets: 218 10*3/uL (ref 150–400)
RBC: 4.42 MIL/uL (ref 3.87–5.11)
RDW: 14.2 % (ref 11.5–15.5)
WBC: 5.6 10*3/uL (ref 4.0–10.5)

## 2011-12-16 NOTE — Patient Instructions (Addendum)
20 Pamela Davenport  12/16/2011   Your procedure is scheduled on:  12/17/11  Enter through the Main Entrance of [facility] at 1030 AM.  Pick up the phone at the desk and dial [phone].   Call this number if you have problems the morning of surgery: [phone]   Remember:   Do not eat food:After Midnight.  Do not drink clear liquids: 4 Hours before arrival.  Take these medicines the morning of surgery with A SIP OF WATER: take blood pressure medication   Do not wear jewelry, make-up or nail polish.  Do not wear lotions, powders, or perfumes. You may wear deodorant.  Do not shave 48 hours prior to surgery.  Do not bring valuables to the hospital.  Contacts, dentures or bridgework may not be worn into surgery.  Leave suitcase in the car. After surgery it may be brought to your room.  For patients admitted to the hospital, checkout time is 11:00 AM the day of discharge.   Patients discharged the day of surgery will not be allowed to drive home.  Name and phone number of your driver: Berna Spare   [phone]  brother  Special Instructions: CHG Shower Use Special Wash: 1/2 bottle night before surgery and 1/2 bottle morning of surgery.   Please read over the following fact sheets that you were given: Surgical Site Infection Prevention

## 2011-12-17 ENCOUNTER — Encounter (HOSPITAL_COMMUNITY): Payer: Self-pay | Admitting: Anesthesiology

## 2011-12-17 ENCOUNTER — Ambulatory Visit (HOSPITAL_COMMUNITY): Payer: 59 | Admitting: Anesthesiology

## 2011-12-17 ENCOUNTER — Ambulatory Visit (HOSPITAL_COMMUNITY)
Admission: RE | Admit: 2011-12-17 | Discharge: 2011-12-17 | Disposition: A | Discharge status: Home / Self Care | Payer: 59 | Source: Ambulatory Visit | Attending: Obstetrics | Admitting: Obstetrics

## 2011-12-17 ENCOUNTER — Encounter (HOSPITAL_COMMUNITY): Payer: Self-pay | Admitting: *Deleted

## 2011-12-17 ENCOUNTER — Encounter (HOSPITAL_COMMUNITY)
Admission: RE | Disposition: A | Discharge status: Home / Self Care | Payer: Self-pay | Source: Ambulatory Visit | Attending: Obstetrics

## 2011-12-17 DIAGNOSIS — R0789 Other chest pain: Secondary | ICD-10-CM

## 2011-12-17 DIAGNOSIS — J020 Streptococcal pharyngitis: Secondary | ICD-10-CM

## 2011-12-17 DIAGNOSIS — I2699 Other pulmonary embolism without acute cor pulmonale: Secondary | ICD-10-CM

## 2011-12-17 DIAGNOSIS — I4949 Other premature depolarization: Secondary | ICD-10-CM

## 2011-12-17 DIAGNOSIS — N938 Other specified abnormal uterine and vaginal bleeding: Secondary | ICD-10-CM | POA: Insufficient documentation

## 2011-12-17 DIAGNOSIS — N925 Other specified irregular menstruation: Secondary | ICD-10-CM | POA: Insufficient documentation

## 2011-12-17 DIAGNOSIS — Z01812 Encounter for preprocedural laboratory examination: Secondary | ICD-10-CM | POA: Insufficient documentation

## 2011-12-17 DIAGNOSIS — I82409 Acute embolism and thrombosis of unspecified deep veins of unspecified lower extremity: Secondary | ICD-10-CM

## 2011-12-17 DIAGNOSIS — D259 Leiomyoma of uterus, unspecified: Secondary | ICD-10-CM | POA: Insufficient documentation

## 2011-12-17 DIAGNOSIS — N949 Unspecified condition associated with female genital organs and menstrual cycle: Secondary | ICD-10-CM | POA: Insufficient documentation

## 2011-12-17 DIAGNOSIS — I4891 Unspecified atrial fibrillation: Secondary | ICD-10-CM

## 2011-12-17 DIAGNOSIS — R609 Edema, unspecified: Secondary | ICD-10-CM

## 2011-12-17 DIAGNOSIS — R030 Elevated blood-pressure reading, without diagnosis of hypertension: Secondary | ICD-10-CM

## 2011-12-17 HISTORY — PX: DILATION AND CURETTAGE OF UTERUS: SHX78

## 2011-12-17 SURGERY — DILATION AND CURETTAGE
Anesthesia: General | Site: Vagina | Wound class: Clean Contaminated

## 2011-12-17 MED ORDER — LIDOCAINE HCL 1 % IJ SOLN
INTRAMUSCULAR | Status: DC | PRN
  Administered 2011-12-17: 10 mL

## 2011-12-17 MED ORDER — PANTOPRAZOLE SODIUM 40 MG PO TBEC
40.0000 mg | DELAYED_RELEASE_TABLET | Freq: Every day | ORAL | Status: DC
  Administered 2011-12-17: 40 mg via ORAL

## 2011-12-17 MED ORDER — LACTATED RINGERS IV SOLN
INTRAVENOUS | Status: DC
  Administered 2011-12-17 (×2): via INTRAVENOUS

## 2011-12-17 MED ORDER — ONDANSETRON HCL 4 MG/2ML IJ SOLN
INTRAMUSCULAR | Status: AC
  Filled 2011-12-17: qty 2

## 2011-12-17 MED ORDER — FENTANYL CITRATE 0.05 MG/ML IJ SOLN
INTRAMUSCULAR | Status: AC
  Administered 2011-12-17: 50 ug via INTRAVENOUS
  Filled 2011-12-17: qty 2

## 2011-12-17 MED ORDER — LIDOCAINE HCL (CARDIAC) 20 MG/ML IV SOLN
INTRAVENOUS | Status: DC | PRN
  Administered 2011-12-17: 60 mg via INTRAVENOUS

## 2011-12-17 MED ORDER — DEXAMETHASONE SODIUM PHOSPHATE 10 MG/ML IJ SOLN
INTRAMUSCULAR | Status: AC
  Filled 2011-12-17: qty 1

## 2011-12-17 MED ORDER — LIDOCAINE HCL (CARDIAC) 20 MG/ML IV SOLN
INTRAVENOUS | Status: AC
  Filled 2011-12-17: qty 5

## 2011-12-17 MED ORDER — FENTANYL CITRATE 0.05 MG/ML IJ SOLN
INTRAMUSCULAR | Status: DC | PRN
  Administered 2011-12-17 (×2): 25 ug via INTRAVENOUS
  Administered 2011-12-17: 50 ug via INTRAVENOUS

## 2011-12-17 MED ORDER — MIDAZOLAM HCL 2 MG/2ML IJ SOLN
INTRAMUSCULAR | Status: AC
  Filled 2011-12-17: qty 2

## 2011-12-17 MED ORDER — SCOPOLAMINE 1 MG/3DAYS TD PT72
MEDICATED_PATCH | TRANSDERMAL | Status: AC
  Administered 2011-12-17: 1.5 mg via TRANSDERMAL
  Filled 2011-12-17: qty 1

## 2011-12-17 MED ORDER — PANTOPRAZOLE SODIUM 40 MG PO TBEC
DELAYED_RELEASE_TABLET | ORAL | Status: AC
  Administered 2011-12-17: 40 mg via ORAL
  Filled 2011-12-17: qty 1

## 2011-12-17 MED ORDER — ONDANSETRON HCL 4 MG/2ML IJ SOLN
INTRAMUSCULAR | Status: DC | PRN
  Administered 2011-12-17: 4 mg via INTRAVENOUS

## 2011-12-17 MED ORDER — KETOROLAC TROMETHAMINE 30 MG/ML IJ SOLN
INTRAMUSCULAR | Status: DC | PRN
  Administered 2011-12-17: 30 mg via INTRAVENOUS

## 2011-12-17 MED ORDER — FENTANYL CITRATE 0.05 MG/ML IJ SOLN
25.0000 ug | INTRAMUSCULAR | Status: DC | PRN
  Administered 2011-12-17 (×4): 50 ug via INTRAVENOUS

## 2011-12-17 MED ORDER — SCOPOLAMINE 1 MG/3DAYS TD PT72
1.0000 | MEDICATED_PATCH | TRANSDERMAL | Status: DC
  Administered 2011-12-17: 1.5 mg via TRANSDERMAL

## 2011-12-17 MED ORDER — PROPOFOL 10 MG/ML IV EMUL
INTRAVENOUS | Status: DC | PRN
  Administered 2011-12-17: 150 mg via INTRAVENOUS
  Administered 2011-12-17: 30 mg via INTRAVENOUS

## 2011-12-17 MED ORDER — FENTANYL CITRATE 0.05 MG/ML IJ SOLN
INTRAMUSCULAR | Status: AC
  Filled 2011-12-17: qty 2

## 2011-12-17 MED ORDER — SODIUM CHLORIDE 0.9 % IR SOLN
Status: DC | PRN
  Administered 2011-12-17: 3000 mL

## 2011-12-17 MED ORDER — MIDAZOLAM HCL 5 MG/5ML IJ SOLN
INTRAMUSCULAR | Status: DC | PRN
  Administered 2011-12-17: 2 mg via INTRAVENOUS

## 2011-12-17 MED ORDER — PROPOFOL 10 MG/ML IV EMUL
INTRAVENOUS | Status: AC
  Filled 2011-12-17: qty 20

## 2011-12-17 SURGICAL SUPPLY — 12 items
CATH ROBINSON RED A/P 16FR (CATHETERS) ×3 IMPLANT
CLOTH BEACON ORANGE TIMEOUT ST (SAFETY) ×3 IMPLANT
CONTAINER PREFILL 10% NBF 60ML (FORM) ×4 IMPLANT
DRAPE HYSTEROSCOPY (DRAPE) ×3 IMPLANT
GLOVE BIO SURGEON STRL SZ8.5 (GLOVE) ×6 IMPLANT
GOWN PREVENTION PLUS LG XLONG (DISPOSABLE) ×3 IMPLANT
GOWN PREVENTION PLUS XXLARGE (GOWN DISPOSABLE) ×3 IMPLANT
GOWN STRL REIN XL XLG (GOWN DISPOSABLE) ×3 IMPLANT
PACK VAGINAL MINOR WOMEN LF (CUSTOM PROCEDURE TRAY) ×3 IMPLANT
SET GENESYS HTA PROCERVA (MISCELLANEOUS) ×3 IMPLANT
SLEEVE SCD COMPRESS KNEE MED (MISCELLANEOUS) ×2 IMPLANT
TOWEL OR 17X24 6PK STRL BLUE (TOWEL DISPOSABLE) ×6 IMPLANT

## 2011-12-17 NOTE — H&P (Deleted)
NAME:  Pamela Davenport, Pamela Davenport NO.:  [id number]  MEDICAL RECORD NO.:  [id number]  LOCATION:  [facility]                           FACILITY:  [facility]  PHYSICIAN:  Kathreen Cosier, M.D.DATE OF BIRTH:  08/18/1956  DATE OF ADMISSION:  12/17/2011 DATE OF DISCHARGE:  12/17/2011                             HISTORY & PHYSICAL   HISTORY OF PRESENT ILLNESS:  The patient is a 56 year old female, gravida 2, para 1-0-1-1, who has had NovaSure ablation and embolization of the uterine vessels in the past because of myoma uteri.  She is now starting to have continuous bleeding for the past 6 months and she still has myomas and is in for hysteroscopy, D and C, and HTA.  PAST SURGICAL HISTORY:  She had embolization of the uterine vessels. She has had NovaSure ablation.  PAST MEDICAL HISTORY:  She has a history of atrial fibrillation, PVCs, DVT, pulmonary embolism.  CURRENT MEDICATIONS:  At the present time, Corgard.  Her Coumadin was stopped on Monday and on Wednesday of this week, she started on Lovenox 100 b.i.d. She received that on Monday and this Thursday a.m. she got 100. She will not receive anymore until Saturday postop day 1.  PHYSICAL EXAMINATION:  GENERAL:  Well-developed female, in no distress. HEENT:  Negative. LUNGS:  Clear. BREASTS:  Negative. HEART:  Regular rhythm.  No murmurs, no gallops. UTERUS:  Myomas present.  GC chlamydia pelvic, all negative. EXTREMITIES:  Negative.          ______________________________ Kathreen Cosier, M.D.     BAM/MEDQ  D:  12/16/2011  T:  12/17/2011  Job:  [id number]

## 2011-12-17 NOTE — Transfer of Care (Signed)
Immediate Anesthesia Transfer of Care Note  Patient: Pamela Davenport  Procedure(s) Performed: Procedure(s) (LRB): DILATATION AND CURETTAGE (N/A)  Patient Location: PACU  Anesthesia Type: General  Level of Consciousness: awake, alert  and oriented  Airway & Oxygen Therapy: Patient Spontanous Breathing and Patient connected to nasal cannula oxygen  Post-op Assessment: Report given to PACU RN and Post -op Vital signs reviewed and stable  Post vital signs: Reviewed and stable  Complications: No apparent anesthesia complications2

## 2011-12-17 NOTE — Anesthesia Preprocedure Evaluation (Signed)
Anesthesia Evaluation  Patient identified by MRN, date of birth, ID band Patient awake    Reviewed: Allergy & Precautions, H&P , NPO status , Patient's Chart, lab work & pertinent test results, reviewed documented beta blocker date and time   History of Anesthesia Complications (+) PONV  Airway Mallampati: I TM Distance: >3 FB Neck ROM: full    Dental  (+) Teeth Intact and Missing   Pulmonary  H/o DVT and PE - anticoagulated on coumadin, bridged with lovenox this week in preparation for surgery.  Last dose Thursday am (4/18) breath sounds clear to auscultation  Pulmonary exam normal       Cardiovascular hypertension, On Home Beta Blockers + dysrhythmias (s/p cardioversion last year, also s/p ablation for PVCs (sustained bigeminy) in 2010) Atrial Fibrillation + Valvular Problems/Murmurs MVP Rhythm:regular Rate:Normal  Atypical chest pain - had normal stress test in march   Neuro/Psych Herniated disc, s/p microdiscectomy L3-4.  Per patient needs another surgery negative psych ROS   GI/Hepatic negative GI ROS, Neg liver ROS,   Endo/Other  negative endocrine ROS  Renal/GU negative Renal ROS  Female GU complaint     Musculoskeletal   Abdominal   Peds  Hematology On coumadin for DVT/PE   Anesthesia Other Findings   Reproductive/Obstetrics negative OB ROS                           Anesthesia Physical Anesthesia Plan  ASA: III  Anesthesia Plan: General LMA   Post-op Pain Management:    Induction:   Airway Management Planned:   Additional Equipment:   Intra-op Plan:   Post-operative Plan:   Informed Consent: I have reviewed the patients History and Physical, chart, labs and discussed the procedure including the risks, benefits and alternatives for the proposed anesthesia with the patient or authorized representative who has indicated his/her understanding and acceptance.   Dental  Advisory Given  Plan Discussed with: CRNA and Surgeon  Anesthesia Plan Comments:         Anesthesia Quick Evaluation

## 2011-12-17 NOTE — Progress Notes (Signed)
Discharge instructions reviewed with patient by Quay Burow, RN in PACU

## 2011-12-17 NOTE — H&P (Signed)
There has been no change and her history or physical exam since yesterday she last received Lovenox 100 mg on Thursday a.m. No other medication except choric since

## 2011-12-17 NOTE — Progress Notes (Signed)
Called Dr Gaynell Face.  He spoke with patient regarding the attempted ablation and answered all patient's questions.

## 2011-12-17 NOTE — H&P (Unsigned)
NAME:  Pamela Davenport, Pamela Davenport               ACCOUNT NO.:  [id number]  MEDICAL RECORD NO.:  [id number]  LOCATION:  [facility]                           FACILITY:  [facility]  PHYSICIAN:  Bernard A. 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  She is now starting to have continuous bleeding for the past 6 months and she still has myomas and is in for hysteroscopy, D and C, and HTA.  PAST SURGICAL HISTORY:  She had embolization of the uterine vessels. She has had NovaSure ablation.  PAST MEDICAL HISTORY:  She has a history of atrial fibrillation, PVCs, DVT, pulmonary embolism.  CURRENT MEDICATIONS:  At the present time, Corgard.  Her Coumadin was stopped on Monday and on Wednesday of this week, she started on Lovenox 100 b.i.d. She received that on Monday and this Thursday a.m. she got 100. She will not receive anymore until Saturday postop day 1.  PHYSICAL EXAMINATION:  GENERAL:  Well-developed female, in no distress. HEENT:  Negative. LUNGS:  Clear. BREASTS:  Negative. HEART:  Regular rhythm.  No murmurs, no gallops. UTERUS:  Myomas present.  GC chlamydia pelvic, all negative. EXTREMITIES:  Negative.          ______________________________ Bernard A. Marshall, M.D.     BAM/MEDQ  D:  12/16/2011  T:  12/17/2011  Job:  [id number] 

## 2011-12-17 NOTE — Op Note (Signed)
preop diagnosis DOB postop diagnosis same procedure hysteroscopy D&C and attempted HTA acedia which General anesthesia Surgeon Dr. Francoise Ceo Acedia on the general anesthesia patient in lithotomy position perineum and vagina prepped and draped data entered with a straight catheter bimanual exam revealed uterus 14 weeks myomas speculum placed in the vagina cervix grasped with a tenaculum and injected with 10 cc 1% Xylocaine endometrial cavity sounded 10 cm and the cervix curetted a small amount of tissue obtained the cervix dilated 25 Pratt hysteroscope inserted Him about some lesions and hysteroscope removed a sharp curettage performed the HTA device was then inserted and failed equipment was changed at pretax in the right was done on the phone and equipment did not work so the procedure was terminated and will be rescheduled at a later date patient tolerated procedure well and and and

## 2011-12-17 NOTE — Discharge Instructions (Signed)
D&C Hysterosocpy °Care After °Read the instructions below. Refer to this sheet in the next few weeks. These instructions provide you with general information on caring for yourself after you leave the hospital. Your caregiver may also give you specific instructions.  °D&C or vacuum curettage is a minor operation. A D&C involves the stretching (dilatation) of the cervix and scraping (curettage) of the inside lining of the uterus. A vacuum curettage gently sucks out the lining and tissue in the uterus with a tube. You may have light cramping and bleeding for a couple of days to two weeks after the procedure. This procedure may be done in a hospital, outpatient clinic, or doctor's office. You may be given a drug to make you sleep (general anesthetic) or a drug that numbs the area (local anesthetic) in and around the cervix. °HOME CARE INSTRUCTIONS °· Do not drive for 24 hours.  °· Wait one week before returning to strenuous activities.  °· Take your temperature two times a day for 4 days and write it down. Provide these temperatures to your caregiver if they are abnormal (above 98.6° F or 37.0° C).  °· Avoid long periods of standing, and do no heavy lifting (more than 10 pounds), pushing or pulling.  °· Limit stair climbing to once or twice a day.  °· Take rest periods often.  °· You may resume your usual diet.  °· Drink plenty of fluids (6-8 glasses a day).  °· You should return to your usual bowel function. If constipation should occur, you may:  °· Take a mild laxative with permission from your caregiver.  °· Add fruit and bran to your diet.  °· Drink more fluids. This helps with constipation.  °· Take showers instead of baths until your caregiver gives you permission to take baths.  °· Do not go swimming or use a hot tub until your caregiver gives you permission.  °· Try to have someone with you or available for you the first 24 to 48 hours, especially if you had a general anesthetic.  °· Do not douche, use  tampons, or have intercourse until after your follow-up appointment, or when your caregiver approves.  °· Only take over-the-counter or prescription medicines for pain, discomfort, or fever as directed by your caregiver. Do not take aspirin. It can cause bleeding.  °· If a prescription was given, follow your caregiver's directions. You may be given a medicine that kills germs (antibiotic) to prevent an infection.  °· Keep all your follow-up appointments recommended by your caregiver.  °SEEK MEDICAL CARE IF: °· You have increasing cramps or pain not relieved with medication.  °· You develop belly (abdominal) pain which does not seem to be related to the same area of earlier cramping and pain.  °· You feel dizzy or feel like fainting.  °· You have bad smelling vaginal discharge.  °· You develop a rash.  °· You develop a reaction or allergy to your medication.  °SEEK IMMEDIATE MEDICAL CARE IF: °· Bleeding is heavier than a normal menstrual period.  °· You have an oral temperature above 100.6, not controlled by medicine.  °· You develop chest pain.  °· You develop shortness of breath.  °· You pass out.  °· You develop pain in your shoulder strap area.  °· You develop heavy vaginal bleeding with or without blood clots.  °MAKE SURE YOU:  °· Understand these instructions.  °· Will watch your condition.  °· Will get help right away if you are   not doing well or get worse.  °UPDATED HEALTH PRACTICES °· A PAP smear is done to screen for cervical cancer.  °· The first PAP smear should be done at age 21.  °· Between ages 21 and 29, PAP smears are repeated every 2 years.  °· Beginning at age 30, you are advised to have a PAP smear every 3 years as long as your past 3 PAP smears have been normal.  °· Some women have medical problems that increase the chance of getting cervical cancer. Talk to your caregiver about these problems. It is especially important to talk to your caregiver if a new problem develops soon after your last PAP  smear. In these cases, your caregiver may recommend more frequent screening and Pap smears.  °· The above recommendations are the same for women who have or have not gotten the vaccine for HPV (Human Papillomavirus).  °· If you had a hysterectomy for a problem that was not a cancer or a condition that could lead to cancer, then you no longer need Pap smears.  °· If you are between ages 65 and 70, and you have had normal Pap smears going back 10 years, you no longer need Pap smears.  °· If you have had past treatment for cervical cancer or a condition that could lead to cancer, you need Pap smears and screening for cancer for at least 20 years after your treatment.  °· Continue monthly self-breast examinations. Your caregiver can provide information and instructions for self-breast examination.  °Document Released: 08/13/2000 Document Re-Released: 02/03/2010 °Exit[employer]® Patient Information ©2011 [employer]. °

## 2011-12-17 NOTE — Anesthesia Procedure Notes (Signed)
Procedure Name: LMA Insertion Date/Time: 12/17/2011 12:11 PM Performed by: Kendal Hymen Pre-anesthesia Checklist: Patient identified, Emergency Drugs available, Patient being monitored, Timeout performed and Suction available Patient Re-evaluated:Patient Re-evaluated prior to inductionOxygen Delivery Method: Circle system utilized and Simple face mask Preoxygenation: Pre-oxygenation with 100% oxygen Intubation Type: IV induction Ventilation: Mask ventilation without difficulty LMA: LMA inserted LMA Size: 4.0 Grade View: Grade I Tube type: Oral Number of attempts: 1 Placement Confirmation: positive ETCO2 and breath sounds checked- equal and bilateral Tube secured with: Tape Dental Injury: Teeth and Oropharynx as per pre-operative assessment

## 2011-12-17 NOTE — Anesthesia Postprocedure Evaluation (Signed)
Anesthesia Post Note  Patient: Pamela Davenport  Procedure(s) Performed: Procedure(s) (LRB): DILATATION AND CURETTAGE (N/A)  Anesthesia type: General  Patient location: PACU  Post pain: Pain level controlled  Post assessment: Post-op Vital signs reviewed  Last Vitals:  Filed Vitals:   12/17/11 1055  BP: 144/90  Pulse: 72  Temp: 36.8 C  Resp: 18    Post vital signs: Reviewed  Level of consciousness: sedated  Complications: No apparent anesthesia complicationsfj

## 2011-12-20 ENCOUNTER — Encounter (HOSPITAL_COMMUNITY): Payer: Self-pay | Admitting: Obstetrics

## 2011-12-23 ENCOUNTER — Ambulatory Visit (INDEPENDENT_AMBULATORY_CARE_PROVIDER_SITE_OTHER): Payer: 59 | Admitting: Pharmacist

## 2011-12-23 ENCOUNTER — Encounter (HOSPITAL_COMMUNITY): Payer: Self-pay | Admitting: *Deleted

## 2011-12-23 ENCOUNTER — Inpatient Hospital Stay (HOSPITAL_COMMUNITY)
Admission: AD | Admit: 2011-12-23 | Discharge: 2011-12-24 | Disposition: A | Discharge status: Home / Self Care | Payer: 59 | Source: Ambulatory Visit | Attending: Obstetrics | Admitting: Obstetrics

## 2011-12-23 DIAGNOSIS — N938 Other specified abnormal uterine and vaginal bleeding: Secondary | ICD-10-CM | POA: Insufficient documentation

## 2011-12-23 DIAGNOSIS — N926 Irregular menstruation, unspecified: Secondary | ICD-10-CM

## 2011-12-23 DIAGNOSIS — N925 Other specified irregular menstruation: Secondary | ICD-10-CM | POA: Insufficient documentation

## 2011-12-23 DIAGNOSIS — N949 Unspecified condition associated with female genital organs and menstrual cycle: Secondary | ICD-10-CM | POA: Insufficient documentation

## 2011-12-23 DIAGNOSIS — I2699 Other pulmonary embolism without acute cor pulmonale: Secondary | ICD-10-CM

## 2011-12-23 DIAGNOSIS — I4891 Unspecified atrial fibrillation: Secondary | ICD-10-CM

## 2011-12-23 DIAGNOSIS — I82409 Acute embolism and thrombosis of unspecified deep veins of unspecified lower extremity: Secondary | ICD-10-CM

## 2011-12-23 DIAGNOSIS — N939 Abnormal uterine and vaginal bleeding, unspecified: Secondary | ICD-10-CM

## 2011-12-23 LAB — POCT INR: INR: 1.5

## 2011-12-23 NOTE — MAU Note (Signed)
Pt states she had an episode.of vaginal bleeding , pt states she had blood pouring out while she was in the shower.

## 2011-12-23 NOTE — MAU Note (Signed)
S/p D&C on  Friday, bleeding since then but not heavy, today started heavy bleeding about 1 hour ago. On coumadin and lovenox

## 2011-12-24 LAB — CBC
HCT: 35.2 % — ABNORMAL LOW (ref 36.0–46.0)
Hemoglobin: 11.1 g/dL — ABNORMAL LOW (ref 12.0–15.0)
MCH: 26.4 pg (ref 26.0–34.0)
MCHC: 31.5 g/dL (ref 30.0–36.0)
MCV: 83.6 fL (ref 78.0–100.0)
Platelets: 188 10*3/uL (ref 150–400)
RBC: 4.21 MIL/uL (ref 3.87–5.11)
RDW: 14.7 % (ref 11.5–15.5)
WBC: 6.7 10*3/uL (ref 4.0–10.5)

## 2011-12-24 LAB — PROTIME-INR
INR: 1.47 (ref 0.00–1.49)
Prothrombin Time: 18.1 seconds — ABNORMAL HIGH (ref 11.6–15.2)

## 2011-12-24 LAB — ABO/RH: ABO/RH(D): AB NEG

## 2011-12-24 NOTE — Progress Notes (Signed)
Pt reassured and informed that provider would come and preform a speculum exam shortly

## 2011-12-24 NOTE — Progress Notes (Signed)
Small amount of frank bleeding noted

## 2011-12-24 NOTE — Discharge Instructions (Signed)
Abnormal Uterine Bleeding Abnormal uterine bleeding can have many causes. Some cases are simply treated, while others are more serious. There are several kinds of bleeding that is considered abnormal, including:  Bleeding between periods.   Bleeding after sexual intercourse.   Spotting anytime in the menstrual cycle.   Bleeding heavier or more than normal.   Bleeding after menopause.  CAUSES  There are many causes of abnormal uterine bleeding. It can be present in teenagers, pregnant women, women during their reproductive years, and women who have reached menopause. Your caregiver will look for the more common causes depending on your age, signs, symptoms and your particular circumstance. Most cases are not serious and can be treated. Even the more serious causes, like cancer of the female organs, can be treated adequately if found in the early stages. That is why all types of bleeding should be evaluated and treated as soon as possible. DIAGNOSIS  Diagnosing the cause may take several kinds of tests. Your caregiver may:  Take a complete history of the type of bleeding.   Perform a complete physical exam and Pap smear.   Take an ultrasound on the abdomen showing a picture of the female organs and the pelvis.   Inject dye into the uterus and Fallopian tubes and X-ray them (hysterosalpingogram).   Place fluid in the uterus and do an ultrasound (sonohysterogrqphy).   Take a CT scan to examine the female organs and pelvis.   Take an MRI to examine the female organs and pelvis. There is no X-ray involved with this procedure.   Look inside the uterus with a telescope that has a light at the end (hysteroscopy).   Scrap the inside of the uterus to get tissue to examine (Dilatation and Curettage, D&C).   Look into the pelvis with a telescope that has a light at the end (laparoscopy). This is done through a very small cut (incision) in the abdomen.  TREATMENT  Treatment will depend on the  cause of the abnormal bleeding. It can include:  Doing nothing to allow the problem to take care of itself over time.   Hormone treatment.   Birth control pills.   Treating the medical condition causing the problem.   Laparoscopy.   Major or minor surgery   Destroying the lining of the uterus with electrical currant, laser, freezing or heat (uterine ablation).  HOME CARE INSTRUCTIONS   Follow your caregiver's recommendation on how to treat your problem.   See your caregiver if you missed a menstrual period and think you may be pregnant.   If you are bleeding heavily, count the number of pads/tampons you use and how often you have to change them. Tell this to your caregiver.   Avoid sexual intercourse until the problem is controlled.  SEEK MEDICAL CARE IF:   You have any kind of abnormal bleeding mentioned above.   You feel dizzy at times.   You are 56 years old and have not had a menstrual period yet.  SEEK IMMEDIATE MEDICAL CARE IF:   You pass out.   You are changing pads/tampons every 15 to 30 minutes.   You have belly (abdominal) pain.   You have a temperature of 100 F (37.8 C) or higher.   You become sweaty or weak.   You are passing large blood clots from the vagina.   You start to feel sick to your stomach (nauseous) and throw up (vomit).  Document Released: 08/16/2005 Document Revised: 08/05/2011 Document Reviewed: 01/09/2009 Exit[employer]   Patient Information 2012 [place], [place].  Warfarin Coagulopathy Warfarin (Coumadin) coagulopathy refers to bleeding that may occur as a complication of the medication warfarin. Warfarin is an oral blood thinner (anticoagulant). Warfarin is used for many problems where thinning of the blood is needed to prevent blood clots. It usually takes 3 to 4 days of treatment with warfarin for the blood to be thinned to the target range. Blood tests will be done routinely to measure how fast your blood clots. The results of the blood  tests will help determine your anticoagulant dose. Every medication has potential side effects or complications. Bleeding is the most common and most serious complication of warfarin. The amount of bleeding may be related to the dose of warfarin, the length of treatment, diet, underlying medical conditions, and the use of other medications or supplements. CAUSES  Intentional or accidental overdose.   Medication, herbal, supplement, or alcohol interactions.   Dietary changes.   Underlying medical conditions.  SYMPTOMS Severe bleeding may occur from any tissue or organ. Symptoms of the blood being too thin may include:  Bleeding from the nose or gums that does not stop quickly.   Unusual bruising or bruising easily.   Swelling or pain at an injection site.   A cut that does not stop bleeding within 10 minutes.   Continual nausea for more than 1 day or vomiting blood.   Coughing up blood.   Blood in the urine which may appear as pink, red, or brown urine.   Blood in bowel movements which may appear as red, dark or black stools.   Sudden weakness or numbness of the face, arm, or leg, especially on one side of the body.   Sudden confusion.   Trouble speaking (aphasia) or understanding.   Sudden trouble seeing in one or both eyes.   Sudden trouble walking.   Dizziness.   Loss of balance or coordination.   Severe pain, such as a headache, joint pain, or back pain.   Fever.  HOME CARE INSTRUCTIONS  Follow up with your laboratory test and caregiver appointments as directed. It is very important to keep your appointments. Not keeping appointments could result in a chronic or permanent injury, pain, or disability.   Do not resume taking warfarin until directed to do so by your caregiver. Take warfarin exactly as directed by your caregiver. It is recommended that you take your warfarin dose at the same time of the day. It is preferred that you take your warfarin in the evening.  This allows you to get your laboratory test results and if necessary, adjust your warfarin dose in a timely manner. Follow your caregiver's instructions if you accidentally take an extra dose or miss a dose of warfarin. It is very important to take warfarin as directed since bleeding or blood clots could result in chronic or permanent injury, pain, or disability.   Some foods interfere with the effectiveness of warfarin. Eating large amounts of foods high in vitamin K can cause warfarin to be less effective. Changing to a diet low in foods containing vitamin K may lead to an excessive warfarin effect. Eat what you normally eat and keep the vitamin K content of your diet consistent. Consult your caregiver before making major dietary changes.   Some vitamins, supplements, and herbal products interfere with the effectiveness of warfarin. Vitamin E may increase the anticoagulant effects of warfarin. Vitamin K may can cause warfarin to be less effective. Consult your caregiver before changing or taking a  new vitamin, supplement, or herbal product.   Several medications interfere with the effectiveness of warfarin. Pain relieving medications, antibiotics, and medications that decrease stomach acid are examples of some medications that can lead to an excessive warfarin effect. Warfarin may also interfere with the effectiveness of your other medicines. Consult your caregiver before stopping, changing, or taking new medications.   Some medical conditions may increase your risk for bleeding while you are taking warfarin. A fever, diarrhea lasting more than a day, worsening heart failure, or worsening liver function are some medical conditions that could affect warfarin. Contact your caregiver if you have any of these medical conditions.   Be careful not to cut yourself when using sharp objects.   Avoid heavy or variable alcohol use. Consume alcohol only in very limited quantities. General alcohol intake guidelines  are 1 drink for women and 2 drinks for men per day. (1 drink = 5 ounces of wine, 12 ounces of beer, or 1 ounces of liquor). A sudden increase in alcohol use can increase your risk of bleeding. Chronic alcohol use can cause warfarin to be less effective.   Limit physical activities or sports that could result in a fall or cause injury.   Inform all your caregivers and your dentist that you take warfarin.  SEEK IMMEDIATE MEDICAL CARE IF:  Bleeding from the nose or gums does not stop quickly.   You have unusual bruising or are bruising easily.   Swelling or pain occurs at an injection site.   A cut does not stop bleeding within 10 minutes.   You have continual nausea for more than 1 day or are vomiting blood.   You are coughing up blood.   You have blood in the urine.   You have dark or black stools.   You have sudden weakness or numbness of the face, arm, or leg, especially on one side of the body.   You have sudden confusion.   You have trouble speaking (aphasia) or understanding.   You have sudden trouble seeing in one or both eyes.   You have sudden trouble walking.   You have dizziness.   You have a loss of balance or coordination.   You have severe pain, such as a headache, joint pain, or back pain.   You have a serious fall or head injury, even if you are not bleeding.   You have an oral temperature above 102 F (38.9 C), not controlled by medicine.  Any of these symptoms may represent a serious problem that is an emergency. Do not wait to see if the symptoms will go away. Get medical help right away. Call your local emergency services (911 in [place] DO NOT drive yourself to the hospital. Document Released: 07/25/2006 Document Revised: 08/05/2011 Document Reviewed: 07/31/2007 [facility] Patient Information 2012 [place], [place].

## 2011-12-24 NOTE — MAU Provider Note (Signed)
History     CSN: [id number]  Arrival date and time: 12/23/11 2135   None    56 y.o.  Chief Complaint  Patient presents with  . Vaginal Bleeding   HPI Pt had episode of heavy vaginal bleeding in shower at 8:30 pm tonight.  She had D&C on Friday and takes Coumadin and Lovenox. INR was not therapeutic yesterday so continuing to take Lovenox to bridge until coumadin therapeutic.  Bleeding has decreased significantly since arrival in MAU.  She denies dizziness, fatigue, h/a, urinary symptoms, vaginal itching/burning, n/v, or fever/chills.   OB History    Grav Para Term Preterm Abortions TAB SAB Ect Mult Living                  Past Medical History  Diagnosis Date  . Symptomatic premature ventricular contractions     improved s/p ablation  . Paroxysmal atrial fibrillation   . PE (pulmonary embolism)     chronically anticoagulated with coumadin  . DVT (deep venous thrombosis)     recurrent; chronically anticoagulated with coumadin  . Strep throat at age 66    The patient reports a severe strep throat infection at age 16 and   is not clear as to whether or not she may have had rheumatic fever.  Marland Kitchen Uterine bleeding 2008    Uterine artery embolization in 2008 for uterine bleeding.  Marland Kitchen Atypical chest pain     normal myoview 11/11/10  . PONV (postoperative nausea and vomiting)   . Herniated lumbar intervertebral disc     Past Surgical History  Procedure Date  . Uterine artery embolization 2008    for uterine bleeding  . Pvc ablation 2010  . Diagnostic d&c hysteroscopy and novasure ablation 03/17/2004  . Vascular surgery     laser of left leg  . Dilation and curettage of uterus 12/17/2011    Procedure: DILATATION AND CURETTAGE;  Surgeon: Kathreen Cosier, MD;  Location: [facility];  Service: Gynecology;  Laterality: N/A;  With Attempted Hydrothermal Ablation    Family History  Problem Relation Age of Onset  . Hypertension Mother   . Diabetes Mother     History  Substance  Use Topics  . Smoking status: Former Games developer  . Smokeless tobacco: Not on file  . Alcohol Use: Yes     rarely    Allergies:  Allergies  Allergen Reactions  . Codeine Nausea Only  . Methylprednisolone     REACTION: Atrial fibrillation at end of medrol dose pack  . Sulfonamide Derivatives Rash    Prescriptions prior to admission  Medication Sig Dispense Refill  . enoxaparin (LOVENOX) 100 MG/ML injection Inject 1 mL (100 mg total) into the skin every 12 (twelve) hours.  20 Syringe  0  . nadolol (CORGARD) 20 MG tablet Take 1 tablet (20 mg total) by mouth daily.  30 tablet  11  . warfarin (COUMADIN) 5 MG tablet Take 5 mg by mouth every Tuesday, Wednesday, Thursday, Saturday, and Sunday at 6 PM.      . warfarin (COUMADIN) 7.5 MG tablet Take 7.5 mg by mouth every Monday at 6 PM. Takes 7.5 mg on Monday and Friday and 5 mg Tuesday, Wednesday Thursday, Saturday, Sunday      . warfarin (COUMADIN) 7.5 MG tablet Take 7.5 mg by mouth every Friday.        Review of Systems  Constitutional: Negative for fever, chills and malaise/fatigue.  Eyes: Negative for blurred vision.  Respiratory: Negative for cough and  shortness of breath.   Cardiovascular: Negative for chest pain.  Gastrointestinal: Negative for heartburn, nausea and vomiting.  Genitourinary: Negative for dysuria, urgency and frequency.  Musculoskeletal: Negative.   Neurological: Negative for dizziness and headaches.  Psychiatric/Behavioral: Negative for depression.   Physical Exam   Blood pressure 139/82, pulse 75, temperature 97.5 F (36.4 C), temperature source Oral, resp. rate 20.  Physical Exam  Nursing note and vitals reviewed. Constitutional: She is oriented to person, place, and time. She appears well-developed and well-nourished.  Neck: Normal range of motion.  Cardiovascular: Normal rate.   Respiratory: Effort normal.  GI: Soft.  Genitourinary:       Pelvic exam: Cervix pink, visually closed, small amount bright red  bleeding noted from cervical os, no pooling of blood in vagina or clots noted Bimanual exam: Cervix 0/long/high, firm, posterior, neg CMT, uterus nontender upon palpation, nonenlarged, adnexa nontender, without enlargement or mass   Musculoskeletal: Normal range of motion.  Neurological: She is alert and oriented to person, place, and time.  Skin: Skin is warm and dry.  Psychiatric: She has a normal mood and affect. Her behavior is normal. Judgment and thought content normal.   Results for orders placed during the hospital encounter of 12/23/11 (from the past 24 hour(s))  PROTIME-INR     Status: Abnormal   Collection Time   12/24/11 12:45 AM      Component Value Range   Prothrombin Time 18.1 (*) 11.6 - 15.2 (seconds)   INR 1.47  0.00 - 1.49   CBC     Status: Abnormal   Collection Time   12/24/11 12:45 AM      Component Value Range   WBC 6.7  4.0 - 10.5 (K/uL)   RBC 4.21  3.87 - 5.11 (MIL/uL)   Hemoglobin 11.1 (*) 12.0 - 15.0 (g/dL)   HCT 29.5 (*) 62.1 - 46.0 (%)   MCV 83.6  78.0 - 100.0 (fL)   MCH 26.4  26.0 - 34.0 (pg)   MCHC 31.5  30.0 - 36.0 (g/dL)   RDW 30.8  65.7 - 84.6 (%)   Platelets 188  150 - 400 (K/uL)  ABO/RH     Status: Normal   Collection Time   12/24/11 12:45 AM      Component Value Range   ABO/RH(D) AB NEG     MAU Course  Procedures U/A, pelvic exam, PT/INR    Assessment and Plan  Abnormal uterine bleeding  D/C home with bleeding precautions F/U with Dr Gaynell Face r/t vaginal bleeding and with [facility] this morning for management of anticoagulation therapy Return to MAU as needed  [place], Pamela Bechtel 12/24/2011, 1:58 AM

## 2012-01-03 ENCOUNTER — Ambulatory Visit (INDEPENDENT_AMBULATORY_CARE_PROVIDER_SITE_OTHER): Payer: 59 | Admitting: *Deleted

## 2012-01-03 DIAGNOSIS — I82409 Acute embolism and thrombosis of unspecified deep veins of unspecified lower extremity: Secondary | ICD-10-CM

## 2012-01-03 DIAGNOSIS — I2699 Other pulmonary embolism without acute cor pulmonale: Secondary | ICD-10-CM

## 2012-01-03 DIAGNOSIS — I4891 Unspecified atrial fibrillation: Secondary | ICD-10-CM

## 2012-01-03 LAB — POCT INR
INR: 2.6
INR: 2.9

## 2012-01-12 ENCOUNTER — Other Ambulatory Visit: Payer: Self-pay | Admitting: Internal Medicine

## 2012-01-12 MED ORDER — NADOLOL 20 MG PO TABS: 20.0000 mg | ORAL_TABLET | Freq: Every day | ORAL | Status: DC

## 2012-01-17 ENCOUNTER — Ambulatory Visit (INDEPENDENT_AMBULATORY_CARE_PROVIDER_SITE_OTHER): Payer: 59 | Admitting: Internal Medicine

## 2012-01-17 ENCOUNTER — Ambulatory Visit (INDEPENDENT_AMBULATORY_CARE_PROVIDER_SITE_OTHER): Payer: 59 | Admitting: *Deleted

## 2012-01-17 ENCOUNTER — Encounter: Payer: Self-pay | Admitting: Internal Medicine

## 2012-01-17 VITALS — BP 130/88 | HR 87 | Ht 69.0 in | Wt 233.0 lb

## 2012-01-17 DIAGNOSIS — I2699 Other pulmonary embolism without acute cor pulmonale: Secondary | ICD-10-CM

## 2012-01-17 DIAGNOSIS — I4949 Other premature depolarization: Secondary | ICD-10-CM

## 2012-01-17 DIAGNOSIS — I4891 Unspecified atrial fibrillation: Secondary | ICD-10-CM

## 2012-01-17 DIAGNOSIS — R0789 Other chest pain: Secondary | ICD-10-CM

## 2012-01-17 DIAGNOSIS — I82409 Acute embolism and thrombosis of unspecified deep veins of unspecified lower extremity: Secondary | ICD-10-CM

## 2012-01-17 LAB — POCT INR: INR: 2.1

## 2012-01-17 MED ORDER — NADOLOL 20 MG PO TABS: 20.0000 mg | ORAL_TABLET | Freq: Every day | ORAL | Status: DC

## 2012-01-17 NOTE — Patient Instructions (Signed)
Your physician wants you to follow-up in: 12 months with Dr Allred You will receive a reminder letter in the mail two months in advance. If you don't receive a letter, please call our office to schedule the follow-up appointment.  

## 2012-01-18 ENCOUNTER — Encounter: Payer: Self-pay | Admitting: Internal Medicine

## 2012-01-18 NOTE — Assessment & Plan Note (Signed)
Much improved, previous myoview was normal

## 2012-01-18 NOTE — Assessment & Plan Note (Signed)
No recent episodes Continue coumadin

## 2012-01-18 NOTE — Assessment & Plan Note (Signed)
Chronically anticoagulated with coumadin

## 2012-01-18 NOTE — Assessment & Plan Note (Signed)
Controlled Continue nadolol

## 2012-01-18 NOTE — Progress Notes (Signed)
PCP: Alva Garnet., MD, MD  The patient presents today for routine electrophysiology followup.  Since last being seen in our clinic, the patient reports doing reasonably well.  She recently underwent D&C.  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  She reports poor sleep and increased stress with this.  Her afib is well controlled.  She denies any recent episodes.  She has occasional PVCs when she is not well rested.  Her chest pain appears to have mostly resolved.   Today, she denies symptoms of palpitations,shortness of breath, orthopnea, PND, lower extremity edema, dizziness, presyncope, syncope, or neurologic sequela.  The patient feels that she is tolerating medications without difficulties and is otherwise without complaint today.   Past Medical History  Diagnosis Date  . Symptomatic premature ventricular contractions     improved s/p ablation  . Paroxysmal atrial fibrillation   . PE (pulmonary embolism)     chronically anticoagulated with coumadin  . DVT (deep venous thrombosis)     recurrent; chronically anticoagulated with coumadin  . Strep throat at age 75    The patient reports a severe strep throat infection at age 83 and   is not clear as to whether or not she may have had rheumatic fever.  Marland Kitchen Uterine bleeding 2008    Uterine artery embolization in 2008 for uterine bleeding.  Marland Kitchen Atypical chest pain     normal myoview 11/11/10  . PONV (postoperative nausea and vomiting)   . Herniated lumbar intervertebral disc    Past Surgical History  Procedure Date  . Uterine artery embolization 2008    for uterine bleeding  . Pvc ablation 2010  . Diagnostic d&c hysteroscopy and novasure ablation 03/17/2004  . Vascular surgery     laser of left leg  . Dilation and curettage of uterus 12/17/2011    Procedure: DILATATION AND CURETTAGE;  Surgeon: Kathreen Cosier, MD;  Location: [facility];  Service: Gynecology;  Laterality: N/A;  With Attempted Hydrothermal Ablation     Current Outpatient Prescriptions  Medication Sig Dispense Refill  . nadolol (CORGARD) 20 MG tablet Take 1 tablet (20 mg total) by mouth daily.  90 tablet  3  . warfarin (COUMADIN) 5 MG tablet Take 5 mg by mouth daily. As directed.      . enoxaparin (LOVENOX) 100 MG/ML injection Inject 1 mL (100 mg total) into the skin every 12 (twelve) hours.  20 Syringe  0    Allergies  Allergen Reactions  . Codeine Nausea Only  . Methylprednisolone     REACTION: Atrial fibrillation at end of medrol dose pack  . Sulfonamide Derivatives Rash    History   Social History  . Marital Status: Single    Spouse Name: N/A    Number of Children: N/A  . Years of Education: N/A   Occupational History  . Nurse    Social History Main Topics  . Smoking status: Former Games developer  . Smokeless tobacco: Not on file  . Alcohol Use: Yes     rarely  . Drug Use: No  . Sexually Active: Not on file   Other Topics Concern  . Not on file   Social History Narrative   Works as a Engineer, civil (consulting) at [employer]    Family History  Problem Relation Age of Onset  . Hypertension Mother   . Diabetes Mother    Physical Exam: Filed Vitals:   01/17/12 1636  BP: 130/88  Pulse: 87  Height: 5\' 9"  (1.753  m)  Weight: 233 lb (105.688 kg)    GEN- The patient is well appearing, alert and oriented x 3 today.   Head- normocephalic, atraumatic Eyes-  Sclera clear, conjunctiva pink Ears- hearing intact Oropharynx- clear Neck- supple, no JVP Lymph- no cervical lymphadenopathy Lungs- Clear to ausculation bilaterally, normal work of breathing Heart- Regular rate and rhythm, no murmurs, rubs or gallops, PMI not laterally displaced GI- soft, NT, ND, + BS Extremities- no clubbing, cyanosis, or edema   EKG today reveals sinus rhythm 87 bpm, RsR', otherwise normal ekg  Myoview from 3/12 reviewed with patient.  Assessment and Plan:

## 2012-02-14 ENCOUNTER — Ambulatory Visit (INDEPENDENT_AMBULATORY_CARE_PROVIDER_SITE_OTHER): Payer: 59 | Admitting: *Deleted

## 2012-02-14 DIAGNOSIS — I2699 Other pulmonary embolism without acute cor pulmonale: Secondary | ICD-10-CM

## 2012-02-14 DIAGNOSIS — I4891 Unspecified atrial fibrillation: Secondary | ICD-10-CM

## 2012-02-14 DIAGNOSIS — I82409 Acute embolism and thrombosis of unspecified deep veins of unspecified lower extremity: Secondary | ICD-10-CM

## 2012-02-14 LAB — POCT INR: INR: 2.9

## 2012-02-14 MED ORDER — WARFARIN SODIUM 5 MG PO TABS: 5.0000 mg | ORAL_TABLET | Freq: Every day | ORAL | Status: DC

## 2012-02-28 ENCOUNTER — Other Ambulatory Visit: Payer: Self-pay | Admitting: *Deleted

## 2012-02-28 MED ORDER — WARFARIN SODIUM 5 MG PO TABS: 5.0000 mg | ORAL_TABLET | Freq: Every day | ORAL | Status: DC

## 2012-03-15 ENCOUNTER — Telehealth: Payer: Self-pay | Admitting: Internal Medicine

## 2012-03-15 NOTE — Telephone Encounter (Addendum)
Pt having possible root canal tomorrow , what to do about coumadin? pls call [phone] by 100p has to go to work and can't answer call after 1p, may need to rs appt

## 2012-03-15 NOTE — Telephone Encounter (Signed)
If needs to hold for more than 1-2 doses will need to be bridged with Lovenox  Patient aware

## 2012-03-27 ENCOUNTER — Ambulatory Visit (INDEPENDENT_AMBULATORY_CARE_PROVIDER_SITE_OTHER): Payer: 59 | Admitting: Pharmacist

## 2012-03-27 DIAGNOSIS — I2699 Other pulmonary embolism without acute cor pulmonale: Secondary | ICD-10-CM

## 2012-03-27 DIAGNOSIS — I82409 Acute embolism and thrombosis of unspecified deep veins of unspecified lower extremity: Secondary | ICD-10-CM

## 2012-03-27 DIAGNOSIS — I4891 Unspecified atrial fibrillation: Secondary | ICD-10-CM

## 2012-03-27 LAB — POCT INR: INR: 2.4

## 2012-03-28 ENCOUNTER — Ambulatory Visit (INDEPENDENT_AMBULATORY_CARE_PROVIDER_SITE_OTHER): Payer: 59 | Admitting: *Deleted

## 2012-03-28 ENCOUNTER — Telehealth: Payer: Self-pay | Admitting: Internal Medicine

## 2012-03-28 DIAGNOSIS — I4891 Unspecified atrial fibrillation: Secondary | ICD-10-CM

## 2012-03-28 MED ORDER — FLECAINIDE ACETATE 100 MG PO TABS: ORAL_TABLET | ORAL | Status: DC

## 2012-03-28 MED ORDER — FLECAINIDE ACETATE 150 MG PO TABS: ORAL_TABLET | ORAL | Status: DC

## 2012-03-28 NOTE — Telephone Encounter (Signed)
New msg Pt said she is in afib. No chest pain. She wants to talk to a nurse Please call

## 2012-03-28 NOTE — Telephone Encounter (Signed)
Patient came in to the office for an EKG.  States she went out of rhythm this am at 10:30am.  She is in afib with a rate of 106  Discussed with Dr Johney Frame  To take Flecainide 300mg  now.  He INR yesterday was 2.4  She is going to take 300mg  now and go home and rest.  She will call me back and let me know if she coverts

## 2012-03-29 NOTE — Telephone Encounter (Signed)
Called and left message for patient to return my call.  Wanting to know if she converted back to normal rhythm

## 2012-03-29 NOTE — Telephone Encounter (Signed)
Took Flecainide 300mg  at 3:30pm and converted back to NSR at 10:30pm  Feels better today.  C/O HA that she had yesterday  It improved with Tylenol yesterday and she is going to take again today  She will call us back if needed

## 2012-04-17 ENCOUNTER — Other Ambulatory Visit (HOSPITAL_COMMUNITY): Payer: Self-pay | Admitting: Obstetrics

## 2012-04-17 DIAGNOSIS — N938 Other specified abnormal uterine and vaginal bleeding: Secondary | ICD-10-CM

## 2012-04-19 ENCOUNTER — Ambulatory Visit (HOSPITAL_COMMUNITY)
Admission: RE | Admit: 2012-04-19 | Discharge: 2012-04-19 | Disposition: A | Discharge status: Home / Self Care | Payer: 59 | Source: Ambulatory Visit | Attending: Obstetrics | Admitting: Obstetrics

## 2012-04-19 DIAGNOSIS — D259 Leiomyoma of uterus, unspecified: Secondary | ICD-10-CM | POA: Insufficient documentation

## 2012-04-19 DIAGNOSIS — N950 Postmenopausal bleeding: Secondary | ICD-10-CM | POA: Insufficient documentation

## 2012-04-19 DIAGNOSIS — N938 Other specified abnormal uterine and vaginal bleeding: Secondary | ICD-10-CM

## 2012-04-19 DIAGNOSIS — I2699 Other pulmonary embolism without acute cor pulmonale: Secondary | ICD-10-CM | POA: Insufficient documentation

## 2012-05-04 ENCOUNTER — Telehealth: Payer: Self-pay | Admitting: Internal Medicine

## 2012-05-04 NOTE — Telephone Encounter (Signed)
Discussed with Dr Johney Frame, she is going to take Flecainide 300mg  now and call me back tomorrow and let me if she converted

## 2012-05-04 NOTE — Telephone Encounter (Signed)
plz return call to patient at [phone]  Patient c/o A-Fib symptoms since 3am, Chest pressure/discomfort. plz return call

## 2012-05-05 NOTE — Telephone Encounter (Signed)
Called and spoke with patient She converted back to NSR around 9pm  It took around 8 hours to convert.  Will schedule a follow up with Dr Johney Frame soon

## 2012-05-08 ENCOUNTER — Ambulatory Visit (INDEPENDENT_AMBULATORY_CARE_PROVIDER_SITE_OTHER): Payer: 59 | Admitting: *Deleted

## 2012-05-08 DIAGNOSIS — I4891 Unspecified atrial fibrillation: Secondary | ICD-10-CM

## 2012-05-08 DIAGNOSIS — I82409 Acute embolism and thrombosis of unspecified deep veins of unspecified lower extremity: Secondary | ICD-10-CM

## 2012-05-08 DIAGNOSIS — I2699 Other pulmonary embolism without acute cor pulmonale: Secondary | ICD-10-CM

## 2012-05-08 LAB — POCT INR: INR: 3.9

## 2012-05-18 ENCOUNTER — Other Ambulatory Visit: Payer: Self-pay | Admitting: *Deleted

## 2012-05-18 MED ORDER — WARFARIN SODIUM 5 MG PO TABS: 5.0000 mg | ORAL_TABLET | ORAL | Status: DC

## 2012-05-22 ENCOUNTER — Ambulatory Visit (INDEPENDENT_AMBULATORY_CARE_PROVIDER_SITE_OTHER): Payer: 59 | Admitting: *Deleted

## 2012-05-22 DIAGNOSIS — I2699 Other pulmonary embolism without acute cor pulmonale: Secondary | ICD-10-CM

## 2012-05-22 DIAGNOSIS — I82409 Acute embolism and thrombosis of unspecified deep veins of unspecified lower extremity: Secondary | ICD-10-CM

## 2012-05-22 DIAGNOSIS — I4891 Unspecified atrial fibrillation: Secondary | ICD-10-CM

## 2012-05-22 LAB — POCT INR: INR: 2

## 2012-07-10 ENCOUNTER — Ambulatory Visit (INDEPENDENT_AMBULATORY_CARE_PROVIDER_SITE_OTHER): Payer: 59 | Admitting: Pharmacist

## 2012-07-10 DIAGNOSIS — I82409 Acute embolism and thrombosis of unspecified deep veins of unspecified lower extremity: Secondary | ICD-10-CM

## 2012-07-10 DIAGNOSIS — I2699 Other pulmonary embolism without acute cor pulmonale: Secondary | ICD-10-CM

## 2012-07-10 DIAGNOSIS — I4891 Unspecified atrial fibrillation: Secondary | ICD-10-CM

## 2012-07-10 LAB — POCT INR: INR: 2.9

## 2012-07-20 ENCOUNTER — Encounter (HOSPITAL_BASED_OUTPATIENT_CLINIC_OR_DEPARTMENT_OTHER): Payer: Self-pay | Admitting: *Deleted

## 2012-07-20 ENCOUNTER — Emergency Department (HOSPITAL_BASED_OUTPATIENT_CLINIC_OR_DEPARTMENT_OTHER): Payer: 59

## 2012-07-20 ENCOUNTER — Emergency Department (HOSPITAL_BASED_OUTPATIENT_CLINIC_OR_DEPARTMENT_OTHER)
Admission: EM | Admit: 2012-07-20 | Discharge: 2012-07-20 | Disposition: A | Discharge status: Home / Self Care | Payer: 59 | Attending: Emergency Medicine | Admitting: Emergency Medicine

## 2012-07-20 DIAGNOSIS — Z8679 Personal history of other diseases of the circulatory system: Secondary | ICD-10-CM | POA: Insufficient documentation

## 2012-07-20 DIAGNOSIS — Z86711 Personal history of pulmonary embolism: Secondary | ICD-10-CM | POA: Insufficient documentation

## 2012-07-20 DIAGNOSIS — Z86718 Personal history of other venous thrombosis and embolism: Secondary | ICD-10-CM | POA: Insufficient documentation

## 2012-07-20 DIAGNOSIS — Z87891 Personal history of nicotine dependence: Secondary | ICD-10-CM | POA: Insufficient documentation

## 2012-07-20 DIAGNOSIS — Z79899 Other long term (current) drug therapy: Secondary | ICD-10-CM | POA: Insufficient documentation

## 2012-07-20 DIAGNOSIS — R05 Cough: Secondary | ICD-10-CM | POA: Insufficient documentation

## 2012-07-20 DIAGNOSIS — R059 Cough, unspecified: Secondary | ICD-10-CM | POA: Insufficient documentation

## 2012-07-20 DIAGNOSIS — J189 Pneumonia, unspecified organism: Secondary | ICD-10-CM

## 2012-07-20 DIAGNOSIS — Z7901 Long term (current) use of anticoagulants: Secondary | ICD-10-CM | POA: Insufficient documentation

## 2012-07-20 DIAGNOSIS — Z9889 Other specified postprocedural states: Secondary | ICD-10-CM | POA: Insufficient documentation

## 2012-07-20 HISTORY — DX: Pneumonia, unspecified organism: J18.9

## 2012-07-20 LAB — BASIC METABOLIC PANEL
BUN: 12 mg/dL (ref 6–23)
CO2: 27 mEq/L (ref 19–32)
Calcium: 9.7 mg/dL (ref 8.4–10.5)
Chloride: 102 mEq/L (ref 96–112)
Creatinine, Ser: 0.7 mg/dL (ref 0.50–1.10)
GFR calc Af Amer: >90 mL/min (ref >90)
GFR calc non Af Amer: >90 mL/min (ref >90)
Glucose, Bld: 91 mg/dL (ref 70–99)
Potassium: 3.8 mEq/L (ref 3.5–5.1)
Sodium: 139 mEq/L (ref 135–145)

## 2012-07-20 LAB — CBC
HCT: 36.6 % (ref 36.0–46.0)
Hemoglobin: 12.4 g/dL (ref 12.0–15.0)
MCH: 27 pg (ref 26.0–34.0)
MCHC: 33.9 g/dL (ref 30.0–36.0)
MCV: 79.6 fL (ref 78.0–100.0)
Platelets: 243 10*3/uL (ref 150–400)
RBC: 4.6 MIL/uL (ref 3.87–5.11)
RDW: 14.1 % (ref 11.5–15.5)
WBC: 6.9 10*3/uL (ref 4.0–10.5)

## 2012-07-20 LAB — PROTIME-INR
INR: 1.81 — ABNORMAL HIGH (ref 0.00–1.49)
Prothrombin Time: 20.3 seconds — ABNORMAL HIGH (ref 11.6–15.2)

## 2012-07-20 LAB — TROPONIN I: Troponin I: <0.3 ng/mL (ref <0.30)

## 2012-07-20 MED ORDER — LEVOFLOXACIN 750 MG PO TABS
750.0000 mg | ORAL_TABLET | Freq: Every day | ORAL | Status: DC
  Administered 2012-07-20: 750 mg via ORAL
  Filled 2012-07-20: qty 1

## 2012-07-20 MED ORDER — AZITHROMYCIN 250 MG PO TABS: 500.0000 mg | ORAL_TABLET | Freq: Once | ORAL | Status: DC

## 2012-07-20 MED ORDER — IOHEXOL 350 MG/ML SOLN: 100.0000 mL | Freq: Once | INTRAVENOUS | Status: DC | PRN

## 2012-07-20 MED ORDER — CEFDINIR 300 MG PO CAPS: 300.0000 mg | ORAL_CAPSULE | Freq: Two times a day (BID) | ORAL | Status: DC

## 2012-07-20 NOTE — ED Notes (Signed)
Chest tightness. Cough when she got up this am. Pamela Davenport to work and felt like she had to struggle to breath. EKG at triage.

## 2012-07-20 NOTE — ED Provider Notes (Signed)
History     CSN: [id number]  Arrival date & time 07/20/12  1717   First MD Initiated Contact with Patient 07/20/12 1818      Chief Complaint  Patient presents with  . Chest Pain    (Consider location/radiation/quality/duration/timing/severity/associated sxs/prior treatment) HPI Pt presents with c/o cough and sensation of chest tightness after coughing episode.  No fever/chills.  Cough nonproductive.  She has also had some mild nasal congestion and sore throat.  No chest pain.  Pt has hx of PE and is on coumadin for this.  States last INR was 2.9.  She does not feel short of breath now.  Symptoms not associated with exertion.  No nausea, no diaphoresis.  There are no other associated systemic symptoms, there are no other alleviating or modifying factors.   Past Medical History  Diagnosis Date  . Symptomatic premature ventricular contractions     improved s/p ablation  . Paroxysmal atrial fibrillation   . PE (pulmonary embolism)     chronically anticoagulated with coumadin  . DVT (deep venous thrombosis)     recurrent; chronically anticoagulated with coumadin  . Strep throat at age 71    The patient reports a severe strep throat infection at age 51 and   is not clear as to whether or not she may have had rheumatic fever.  Marland Kitchen Uterine bleeding 2008    Uterine artery embolization in 2008 for uterine bleeding.  Marland Kitchen Atypical chest pain     normal myoview 11/11/10  . PONV (postoperative nausea and vomiting)   . Herniated lumbar intervertebral disc     Past Surgical History  Procedure Date  . Uterine artery embolization 2008    for uterine bleeding  . Pvc ablation 2010  . Diagnostic d&c hysteroscopy and novasure ablation 03/17/2004  . Vascular surgery     laser of left leg  . Dilation and curettage of uterus 12/17/2011    Procedure: DILATATION AND CURETTAGE;  Surgeon: Kathreen Cosier, MD;  Location: [facility];  Service: Gynecology;  Laterality: N/A;  With Attempted Hydrothermal  Ablation    Family History  Problem Relation Age of Onset  . Hypertension Mother   . Diabetes Mother     History  Substance Use Topics  . Smoking status: Former Games developer  . Smokeless tobacco: Not on file  . Alcohol Use: Yes     Comment: rarely    OB History    Grav Para Term Preterm Abortions TAB SAB Ect Mult Living                  Review of Systems ROS reviewed and all otherwise negative except for mentioned in HPI  Allergies  Codeine; Methylprednisolone; and Sulfonamide derivatives  Home Medications   Current Outpatient Rx  Name  Route  Sig  Dispense  Refill  . CEFDINIR 300 MG PO CAPS   Oral   Take 1 capsule (300 mg total) by mouth 2 (two) times daily.   20 capsule   0   . ENOXAPARIN SODIUM 100 MG/ML [place] SOLN   Subcutaneous   Inject 1 mL (100 mg total) into the skin every 12 (twelve) hours.   20 Syringe   0   . FLECAINIDE ACETATE 150 MG PO TABS      Take 2 pills now for afib   10 tablet   0   . NADOLOL 20 MG PO TABS   Oral   Take 1 tablet (20 mg total) by mouth daily.  90 tablet   3     Patient must have office visit for additional refi ...   . WARFARIN SODIUM 5 MG PO TABS   Oral   Take 1 tablet (5 mg total) by mouth as directed. As directed.   120 tablet   1     120 tabs is 3 mth supply     BP 162/91  Pulse 69  Temp 97.9 F (36.6 C) (Oral)  Resp 18  Ht 5\' 9"  (1.753 m)  Wt 235 lb (106.595 kg)  BMI 34.70 kg/m2  SpO2 100% Vitals reviewed Physical Exam Physical Examination: General appearance - alert, well appearing, and in no distress Mental status - alert, oriented to person, place, and time Eyes - no scleral icterus, no conjunctival injection Mouth - mucous membranes moist, pharynx normal without lesions Chest - clear to auscultation, no wheezes, rales or rhonchi, symmetric air entry, no increased respiratory effort Heart - normal rate, regular rhythm, normal S1, S2, no murmurs, rubs, clicks or gallops Abdomen - soft, nontender,  nondistended, no masses or organomegaly Extremities - peripheral pulses normal, no pedal edema, no clubbing or cyanosis Skin - normal coloration and turgor, no rashes ED Course  Procedures (including critical care time)   Date: 07/20/2012  Rate: 66  Rhythm: normal sinus rhythm  QRS Axis: normal  Intervals: normal  ST/T Wave abnormalities: normal  Conduction Disutrbances: none  Narrative Interpretation: unremarkable, no prior ekg for comparison       Labs Reviewed  PROTIME-INR - Abnormal; Notable for the following:    Prothrombin Time 20.3 (*)     INR 1.81 (*)     All other components within normal limits  CBC  BASIC METABOLIC PANEL  TROPONIN I   Dg Chest 2 View  07/20/2012  *RADIOLOGY REPORT*  Clinical Data: Chest pain, cough, left-sided chest tightness  CHEST - 2 VIEW  Comparison: 07/02/2008  Findings: Unchanged cardiac silhouette and mediastinal contours. Possible developing ill-defined airspace opacity within the right mid/lower lung.  No left-sided airspace opacities.  No pleural effusion or pneumothorax.  Unchanged bones.  IMPRESSION: Possible developing airspace opacity within the right mid/lower lung worrisome for infection.   A follow-up chest radiograph in 4 to 6 weeks after treatment is recommended to ensure resolution.   Original Report Authenticated By: Tacey Ruiz, MD    Ct Angio Chest Pe W/cm &/or Wo Cm  07/20/2012  *RADIOLOGY REPORT*  Clinical Data:  Shortness of breath, left chest tightness, history pulmonary embolism, subtherapeutic INR  CT ANGIOGRAPHY CHEST  Technique:  Multidetector CT imaging of the chest using the standard protocol during bolus administration of intravenous contrast. Multiplanar reconstructed images including MIPs were obtained and reviewed to evaluate the vascular anatomy.  Contrast:  100 ml Omnipaque 350 IV.  Comparison: None  Findings: Mild atherosclerotic calcification aorta. No aortic aneurysm or dissection. No evidence of pulmonary  embolism. Minimally enlarged precarinal lymph node 16 x 11 mm image 38. Normal-sized hilar and axillary nodes bilaterally. Visualized portions of liver and spleen normal appearance. Tiny hiatal hernia.  3 mm noncalcified right upper lobe nodule image 37. Poorly defined opacity lateral right lower lobe 6 mm diameter image 75. Atelectasis is less likely nodule on lateral lower left lung 8 mm diameter image 64. Mild dependent atelectasis in left lower lobe. Questionable tiny nodule superior segment right lower lobe image 23. No acute infiltrate or pleural effusion. Osseous structures unremarkable.  IMPRESSION: No evidence of pulmonary embolism. Single nonspecific minimally enlarged precarinal lymph  node 16 x 11 mm. Bilateral pulmonary nodular foci as above, including a 6 mm diameter potential non solid opacity in the right lower lobe, recommendation below.  Initial follow-up by chest CT without contrast is recommended in 3 months to confirm persistence of the nonsolid opacity.   This recommendation follows the consensus statement: Recommendations for the Management of Subsolid Pulmonary Nodules Detected at CT:  A Statement from the Fleischner Society as published in Radiology 2013; 266:304-317.   Original Report Authenticated By: Ulyses Southward, M.D.      1. Community acquired pneumonia       MDM  Pt with hx of PE presenting with sob and cough today.  EKG reassuring, labs also reassuring except INR which is supbtherapeutic, low suspicion for ACS, CXR shows RML pneumonia- will plan to start on antibitoics.  However due to INR being low, CT angio obtained.  This did not show evidence of PE- did show a lymph node an nonspecific nodules- pt adivsed about these and the recommendation for repeat CT scan in 3 months to evaluate.  Pt started on omnicef- due to not being able to place her on macrolides or quinolones due to her being on flecainide and concern for prolonged QT.  Discharged with strict return precautions.   Pt agreeable with plan.        Ethelda Chick, MD 07/20/12 2128

## 2012-07-20 NOTE — ED Notes (Signed)
3 lab sticks unsuccessful, charge nurse notified for lab collection.

## 2012-08-07 ENCOUNTER — Ambulatory Visit (INDEPENDENT_AMBULATORY_CARE_PROVIDER_SITE_OTHER): Payer: 59 | Admitting: *Deleted

## 2012-08-07 DIAGNOSIS — I2699 Other pulmonary embolism without acute cor pulmonale: Secondary | ICD-10-CM

## 2012-08-07 DIAGNOSIS — I4891 Unspecified atrial fibrillation: Secondary | ICD-10-CM

## 2012-08-07 DIAGNOSIS — I82409 Acute embolism and thrombosis of unspecified deep veins of unspecified lower extremity: Secondary | ICD-10-CM

## 2012-08-07 LAB — POCT INR: INR: 2.9

## 2012-10-09 ENCOUNTER — Ambulatory Visit (INDEPENDENT_AMBULATORY_CARE_PROVIDER_SITE_OTHER): Payer: 59 | Admitting: *Deleted

## 2012-10-09 DIAGNOSIS — I2699 Other pulmonary embolism without acute cor pulmonale: Secondary | ICD-10-CM

## 2012-10-09 DIAGNOSIS — I4891 Unspecified atrial fibrillation: Secondary | ICD-10-CM

## 2012-10-09 DIAGNOSIS — I82409 Acute embolism and thrombosis of unspecified deep veins of unspecified lower extremity: Secondary | ICD-10-CM

## 2012-10-09 LAB — POCT INR: INR: 3.5

## 2012-10-14 ENCOUNTER — Other Ambulatory Visit: Payer: Self-pay

## 2012-11-06 ENCOUNTER — Ambulatory Visit (INDEPENDENT_AMBULATORY_CARE_PROVIDER_SITE_OTHER): Payer: 59 | Admitting: *Deleted

## 2012-11-06 DIAGNOSIS — I82409 Acute embolism and thrombosis of unspecified deep veins of unspecified lower extremity: Secondary | ICD-10-CM

## 2012-11-06 DIAGNOSIS — I2699 Other pulmonary embolism without acute cor pulmonale: Secondary | ICD-10-CM

## 2012-11-06 DIAGNOSIS — I4891 Unspecified atrial fibrillation: Secondary | ICD-10-CM

## 2012-11-06 LAB — POCT INR: INR: 2.5

## 2012-11-08 ENCOUNTER — Other Ambulatory Visit (HOSPITAL_COMMUNITY): Payer: Self-pay | Admitting: Neurosurgery

## 2012-11-08 DIAGNOSIS — M545 Low back pain, unspecified: Secondary | ICD-10-CM

## 2012-11-10 ENCOUNTER — Ambulatory Visit (HOSPITAL_COMMUNITY)
Admission: RE | Admit: 2012-11-10 | Discharge: 2012-11-10 | Disposition: A | Discharge status: Home / Self Care | Payer: 59 | Source: Ambulatory Visit | Attending: Neurosurgery | Admitting: Neurosurgery

## 2012-11-10 DIAGNOSIS — M5126 Other intervertebral disc displacement, lumbar region: Secondary | ICD-10-CM | POA: Insufficient documentation

## 2012-11-10 DIAGNOSIS — M545 Low back pain, unspecified: Secondary | ICD-10-CM | POA: Insufficient documentation

## 2012-11-10 DIAGNOSIS — M79609 Pain in unspecified limb: Secondary | ICD-10-CM | POA: Insufficient documentation

## 2012-11-10 MED ORDER — GADOBENATE DIMEGLUMINE 529 MG/ML IV SOLN
20.0000 mL | Freq: Once | INTRAVENOUS | Status: AC
  Administered 2012-11-10: 20 mL via INTRAVENOUS

## 2012-12-12 ENCOUNTER — Ambulatory Visit (INDEPENDENT_AMBULATORY_CARE_PROVIDER_SITE_OTHER): Payer: 59 | Admitting: Pharmacist

## 2012-12-12 DIAGNOSIS — I82409 Acute embolism and thrombosis of unspecified deep veins of unspecified lower extremity: Secondary | ICD-10-CM

## 2012-12-12 DIAGNOSIS — I4891 Unspecified atrial fibrillation: Secondary | ICD-10-CM

## 2012-12-12 DIAGNOSIS — I2699 Other pulmonary embolism without acute cor pulmonale: Secondary | ICD-10-CM

## 2012-12-12 LAB — POCT INR: INR: 2.4

## 2013-01-01 ENCOUNTER — Other Ambulatory Visit: Payer: Self-pay | Admitting: Emergency Medicine

## 2013-01-01 MED ORDER — NADOLOL 20 MG PO TABS: 20.0000 mg | ORAL_TABLET | Freq: Every day | ORAL | Status: DC

## 2013-01-08 ENCOUNTER — Ambulatory Visit (INDEPENDENT_AMBULATORY_CARE_PROVIDER_SITE_OTHER): Payer: 59 | Admitting: *Deleted

## 2013-01-08 DIAGNOSIS — I4891 Unspecified atrial fibrillation: Secondary | ICD-10-CM

## 2013-01-08 DIAGNOSIS — I2699 Other pulmonary embolism without acute cor pulmonale: Secondary | ICD-10-CM

## 2013-01-08 DIAGNOSIS — I82409 Acute embolism and thrombosis of unspecified deep veins of unspecified lower extremity: Secondary | ICD-10-CM

## 2013-01-08 LAB — POCT INR: INR: 2.8

## 2013-01-17 ENCOUNTER — Telehealth: Payer: Self-pay | Admitting: Internal Medicine

## 2013-01-17 NOTE — Telephone Encounter (Signed)
New Prob      Pt following up on a surgical clearance form. Please call.

## 2013-01-18 ENCOUNTER — Telehealth: Payer: Self-pay | Admitting: *Deleted

## 2013-01-18 NOTE — Telephone Encounter (Signed)
Faxed back, ok for surgery will require Lovenox bridge

## 2013-01-18 NOTE — Telephone Encounter (Signed)
Message copied by Estelene Carmack, Franchot Mimes on Thu Jan 18, 2013  2:51 PM ------      Message from: Deliah Boston      Created: Thu Jan 18, 2013 12:08 PM       She is going to have back surgery and will require Lovenox bridge per Dr Johney Frame  Daylene Katayama ------

## 2013-01-18 NOTE — Telephone Encounter (Signed)
LMOM for pt to call back with date of surgery, and how long she's to be off and her current weight.

## 2013-01-19 ENCOUNTER — Other Ambulatory Visit: Payer: Self-pay | Admitting: Neurosurgery

## 2013-01-24 ENCOUNTER — Encounter (HOSPITAL_COMMUNITY): Payer: Self-pay | Admitting: Pharmacy Technician

## 2013-01-26 MED ORDER — ENOXAPARIN SODIUM 150 MG/ML ~~LOC~~ SOLN: 150.0000 mg | Freq: Every day | SUBCUTANEOUS | Status: DC

## 2013-01-26 NOTE — Telephone Encounter (Signed)
Pt calls back and she is aware that she is to take last dose on 02/13/13 since she needs to hold 7 days prior to surgery on 02/21/13. She states her weight is 230lbs. Appt made for 02/15/13 to check INR to determine when to start Lovenox injections. Lovenox 150mg s syringes ordered. Pt will do Lovenox 150mg s SQ Q daily.

## 2013-02-12 NOTE — Pre-Procedure Instructions (Signed)
Pamela Davenport  02/12/2013   Your procedure is scheduled on:  Wednesday February 21, 2013.  Report to [facility] at 6:30 AM.  Call this number if you have problems the morning of surgery: [phone]   Remember:   Do not eat food or drink liquids after midnight.   Take these medicines the morning of surgery with A SIP OF WATER: Hydrocodone if needed for pain, Nadolol (Corgard)    Stop any aspirin, coumadin, or herbal medications.   Patient should be on Lovenox bridge per Dr Johney Frame cardiologist   Do not wear jewelry, make-up or nail polish.  Do not wear lotions, powders, or perfumes.   Do not shave 48 hours prior to surgery.   Do not bring valuables to the hospital.  [facility] is not responsible for any belongings or valuables.  Contacts, dentures or bridgework may not be worn into surgery.  Leave suitcase in the car. After surgery it may be brought to your room.  For patients admitted to the hospital, checkout time is 11:00 AM the day of discharge.   Patients discharged the day of surgery will not be allowed to drive home.  Name and phone number of your driver: Family/Friend  Special Instructions: Shower using CHG 2 nights before surgery and the night before surgery.  If you shower the day of surgery use CHG.  Use special wash - you have one bottle of CHG for all showers.  You should use approximately 1/3 of the bottle for each shower.   Please read over the following fact sheets that you were given: Pain Booklet, Coughing and Deep Breathing, MRSA Information and Surgical Site Infection Prevention

## 2013-02-13 ENCOUNTER — Encounter (HOSPITAL_COMMUNITY)
Admission: RE | Admit: 2013-02-13 | Discharge: 2013-02-13 | Disposition: A | Discharge status: Home / Self Care | Payer: 59 | Source: Ambulatory Visit | Attending: Neurosurgery | Admitting: Neurosurgery

## 2013-02-13 ENCOUNTER — Encounter (HOSPITAL_COMMUNITY): Payer: Self-pay

## 2013-02-13 DIAGNOSIS — Z01818 Encounter for other preprocedural examination: Secondary | ICD-10-CM | POA: Insufficient documentation

## 2013-02-13 DIAGNOSIS — Z01812 Encounter for preprocedural laboratory examination: Secondary | ICD-10-CM | POA: Insufficient documentation

## 2013-02-13 HISTORY — DX: Sickle-cell trait: D57.3

## 2013-02-13 HISTORY — DX: Pneumonia, unspecified organism: J18.9

## 2013-02-13 HISTORY — DX: Family history of other specified conditions: Z84.89

## 2013-02-13 HISTORY — DX: Urgency of urination: R39.15

## 2013-02-13 LAB — BASIC METABOLIC PANEL
BUN: 17 mg/dL (ref 6–23)
CO2: 28 mEq/L (ref 19–32)
Calcium: 9.2 mg/dL (ref 8.4–10.5)
Chloride: 105 mEq/L (ref 96–112)
Creatinine, Ser: 0.93 mg/dL (ref 0.50–1.10)
GFR calc Af Amer: 78 mL/min — ABNORMAL LOW (ref >90)
GFR calc non Af Amer: 67 mL/min — ABNORMAL LOW (ref >90)
Glucose, Bld: 101 mg/dL — ABNORMAL HIGH (ref 70–99)
Potassium: 4 mEq/L (ref 3.5–5.1)
Sodium: 140 mEq/L (ref 135–145)

## 2013-02-13 LAB — PROTIME-INR
INR: 1.66 — ABNORMAL HIGH (ref 0.00–1.49)
Prothrombin Time: 19.1 seconds — ABNORMAL HIGH (ref 11.6–15.2)

## 2013-02-13 LAB — APTT: aPTT: 35 seconds (ref 24–37)

## 2013-02-13 LAB — CBC
HCT: 37.4 % (ref 36.0–46.0)
Hemoglobin: 12.5 g/dL (ref 12.0–15.0)
MCH: 27.1 pg (ref 26.0–34.0)
MCHC: 33.4 g/dL (ref 30.0–36.0)
MCV: 81.1 fL (ref 78.0–100.0)
Platelets: 222 10*3/uL (ref 150–400)
RBC: 4.61 MIL/uL (ref 3.87–5.11)
RDW: 13.9 % (ref 11.5–15.5)
WBC: 6.7 10*3/uL (ref 4.0–10.5)

## 2013-02-13 LAB — SURGICAL PCR SCREEN
MRSA, PCR: NEGATIVE
Staphylococcus aureus: NEGATIVE

## 2013-02-13 NOTE — Progress Notes (Signed)
Cardiologist is Dr. Johney Frame and patient stated that she was instructed to stop coumadin on 02/12/13 and had a prescription for lovenox but was unaware as to when she was to start medication. Patient stated she would call Dr. Jenel Lucks office and clarify with she was to start Lovenox bridge. Patient has an appointment with Dr. Johney Frame on Thursday 02/15/13. Patient stated she has episodes of atrial fibrillation but has not had any since August or September of 2013. Patient denied having a cardiac cath or sleep study.

## 2013-02-14 LAB — HM COLONOSCOPY

## 2013-02-14 NOTE — Progress Notes (Addendum)
Anesthesia Chart Review:  Patient is a 57 year old female scheduled for left L3-4 laminotomy foraminotomy microdiskectomy on 02/21/13 by Dr. Franky Macho.  History includes former smoker, obesity, PVCs s/p ablation, paroxsymal afib, atypical chest pain with negative Myoview '12, recurrent DVT/PE on chronic Coumadin therapy, sickle cell trait, tonsillectomy, mandibular surgery for underbite, uterine artery embolization '08, LLE VV laser therapy.  For anesthesia history, she reported post-operative N/V and a sister who had hypopnea post-operatively.  Cardiologist is Dr. Johney Frame.  He felt she was "ok" for surgyer but would require a Lovenox bridge.  (Patient has already held her Coumadin and is due for an INR check on 02/15/13 to determine when she should start Lovenox.)  EKG on 07/20/12 showed NSR, RV conduction delay.  She had a normal nuclear study on 11/26/10, EF 58%.  Echo on 03/11/10 showed: - Left ventricle: The cavity size was normal. Wall thickness was normal. Prominent apical trabeculations. Systolic function was normal. The estimated ejection fraction was in the range of 55% to 60%. Wall motion was normal; there were no regional wall motion abnormalities. Left ventricular diastolic function parameters were normal. - Aortic valve: There was no stenosis. - Mitral valve: Trivial regurgitation. - Left atrium: The atrium was at the upper limits of normal in size. - Right ventricle: The cavity size was normal. Systolic function was normal. - Pulmonary arteries: PA pressure 19-23 mmHg. - Systemic veins: IVC measured 2.2 cm with normal respirophasic variation, suggesting RA pressure 6-10 mmHg. Impressions: Normal LV size and systolic/diastolic function, EF 55-60%. There are prominent apical trabeculations,LV noncompaction would be a consideration but the views available do not make a definitive diagnosis. Normal PA pressure. Normal RV size and function. No significant valvular abnormalities.  Negative CXR on  02/13/13.  Preoperative labs noted.  Repeat PT/PTT on arrival.  Velna Ochs [facility]/Anesthesiology Phone [phone] 02/14/2013 9:57 AM

## 2013-02-15 ENCOUNTER — Ambulatory Visit (INDEPENDENT_AMBULATORY_CARE_PROVIDER_SITE_OTHER): Payer: 59 | Admitting: *Deleted

## 2013-02-15 DIAGNOSIS — I2699 Other pulmonary embolism without acute cor pulmonale: Secondary | ICD-10-CM

## 2013-02-15 DIAGNOSIS — I82409 Acute embolism and thrombosis of unspecified deep veins of unspecified lower extremity: Secondary | ICD-10-CM

## 2013-02-15 DIAGNOSIS — I4891 Unspecified atrial fibrillation: Secondary | ICD-10-CM

## 2013-02-15 LAB — POCT INR: INR: 1.4

## 2013-02-15 NOTE — Patient Instructions (Addendum)
02/15/13- Pt will start Lovenox 150mg  injection subcutaneously today every 24 hours 02/16/13- Continue Lovenox injection 150mg s everyday at 7am   02/17/13- Continue Lovenox injection 150mg s everyday at 7am 02/18/13-Continue Lovenox injection 150mg s everyday at 7am 02/19/13- Continue Lovenox injection 150mg s everyday at 7am 02/20/13- Take last Lovenox injection 150mg s this AM at 7am  Call when discharged. Resume coumadin and Lovenox when [facility] per surgeon take an extra 1/2 tab of coumadin for 2 days,then normal dosage.   # [id number]

## 2013-02-20 MED ORDER — CEFAZOLIN SODIUM-DEXTROSE 2-3 GM-% IV SOLR
2.0000 g | INTRAVENOUS | Status: AC
  Administered 2013-02-21: 2 g via INTRAVENOUS
  Filled 2013-02-20: qty 50

## 2013-02-21 ENCOUNTER — Encounter (HOSPITAL_COMMUNITY): Payer: Self-pay | Admitting: Vascular Surgery

## 2013-02-21 ENCOUNTER — Ambulatory Visit (HOSPITAL_COMMUNITY): Payer: 59

## 2013-02-21 ENCOUNTER — Ambulatory Visit (HOSPITAL_COMMUNITY)
Admission: RE | Admit: 2013-02-21 | Discharge: 2013-02-22 | Disposition: A | Discharge status: Home / Self Care | Payer: 59 | Source: Ambulatory Visit | Attending: Neurosurgery | Admitting: Neurosurgery

## 2013-02-21 ENCOUNTER — Encounter (HOSPITAL_COMMUNITY): Payer: Self-pay | Admitting: Surgery

## 2013-02-21 ENCOUNTER — Ambulatory Visit (HOSPITAL_COMMUNITY): Payer: 59 | Admitting: Anesthesiology

## 2013-02-21 ENCOUNTER — Encounter (HOSPITAL_COMMUNITY)
Admission: RE | Disposition: A | Discharge status: Home / Self Care | Payer: Self-pay | Source: Ambulatory Visit | Attending: Neurosurgery

## 2013-02-21 DIAGNOSIS — M5126 Other intervertebral disc displacement, lumbar region: Secondary | ICD-10-CM | POA: Insufficient documentation

## 2013-02-21 DIAGNOSIS — I4891 Unspecified atrial fibrillation: Secondary | ICD-10-CM | POA: Insufficient documentation

## 2013-02-21 DIAGNOSIS — M79609 Pain in unspecified limb: Secondary | ICD-10-CM | POA: Insufficient documentation

## 2013-02-21 DIAGNOSIS — M47817 Spondylosis without myelopathy or radiculopathy, lumbosacral region: Secondary | ICD-10-CM | POA: Insufficient documentation

## 2013-02-21 HISTORY — PX: LUMBAR LAMINECTOMY/DECOMPRESSION MICRODISCECTOMY: SHX5026

## 2013-02-21 SURGERY — LUMBAR LAMINECTOMY/DECOMPRESSION MICRODISCECTOMY 1 LEVEL
Anesthesia: General | Laterality: Left | Wound class: Clean

## 2013-02-21 MED ORDER — SUFENTANIL CITRATE 50 MCG/ML IV SOLN
INTRAVENOUS | Status: DC | PRN
  Administered 2013-02-21 (×2): 10 ug via INTRAVENOUS

## 2013-02-21 MED ORDER — PHENOL 1.4 % MT LIQD: 1.0000 | OROMUCOSAL | Status: DC | PRN

## 2013-02-21 MED ORDER — ENOXAPARIN SODIUM 150 MG/ML ~~LOC~~ SOLN
150.0000 mg | Freq: Every day | SUBCUTANEOUS | Status: DC
  Filled 2013-02-21: qty 1

## 2013-02-21 MED ORDER — PROPOFOL 10 MG/ML IV BOLUS
INTRAVENOUS | Status: DC | PRN
  Administered 2013-02-21: 200 mg via INTRAVENOUS

## 2013-02-21 MED ORDER — LIDOCAINE-EPINEPHRINE 0.5 %-1:200000 IJ SOLN
INTRAMUSCULAR | Status: DC | PRN
  Administered 2013-02-21: 10 mL

## 2013-02-21 MED ORDER — ONDANSETRON HCL 4 MG/2ML IJ SOLN: 4.0000 mg | Freq: Once | INTRAMUSCULAR | Status: DC | PRN

## 2013-02-21 MED ORDER — DIAZEPAM 5 MG PO TABS
ORAL_TABLET | ORAL | Status: AC
  Filled 2013-02-21: qty 1

## 2013-02-21 MED ORDER — SODIUM CHLORIDE 0.9 % IJ SOLN: 3.0000 mL | INTRAMUSCULAR | Status: DC | PRN

## 2013-02-21 MED ORDER — ACETAMINOPHEN 325 MG PO TABS: 650.0000 mg | ORAL_TABLET | ORAL | Status: DC | PRN

## 2013-02-21 MED ORDER — NEOSTIGMINE METHYLSULFATE 1 MG/ML IJ SOLN
INTRAMUSCULAR | Status: DC | PRN
  Administered 2013-02-21: 4 mg via INTRAVENOUS

## 2013-02-21 MED ORDER — HYDROCODONE-ACETAMINOPHEN 5-325 MG PO TABS
1.0000 | ORAL_TABLET | ORAL | Status: DC | PRN
  Administered 2013-02-21: 2 via ORAL
  Filled 2013-02-21: qty 2

## 2013-02-21 MED ORDER — ONDANSETRON HCL 4 MG/2ML IJ SOLN
INTRAMUSCULAR | Status: DC | PRN
  Administered 2013-02-21: 4 mg via INTRAVENOUS

## 2013-02-21 MED ORDER — HYDROCODONE-ACETAMINOPHEN 5-325 MG PO TABS
ORAL_TABLET | ORAL | Status: AC
  Administered 2013-02-21: 2
  Filled 2013-02-21: qty 2

## 2013-02-21 MED ORDER — KETOROLAC TROMETHAMINE 30 MG/ML IJ SOLN
30.0000 mg | Freq: Four times a day (QID) | INTRAMUSCULAR | Status: DC
  Administered 2013-02-21 – 2013-02-22 (×3): 30 mg via INTRAVENOUS
  Filled 2013-02-21 (×7): qty 1

## 2013-02-21 MED ORDER — ZOLPIDEM TARTRATE 5 MG PO TABS: 5.0000 mg | ORAL_TABLET | Freq: Every evening | ORAL | Status: DC | PRN

## 2013-02-21 MED ORDER — POTASSIUM CHLORIDE IN NACL 20-0.9 MEQ/L-% IV SOLN
INTRAVENOUS | Status: DC
  Filled 2013-02-21 (×3): qty 1000

## 2013-02-21 MED ORDER — DIAZEPAM 5 MG PO TABS
5.0000 mg | ORAL_TABLET | Freq: Four times a day (QID) | ORAL | Status: DC | PRN
  Administered 2013-02-21 (×2): 5 mg via ORAL
  Filled 2013-02-21: qty 1

## 2013-02-21 MED ORDER — LACTATED RINGERS IV SOLN
INTRAVENOUS | Status: DC | PRN
  Administered 2013-02-21 (×2): via INTRAVENOUS

## 2013-02-21 MED ORDER — NADOLOL 20 MG PO TABS
20.0000 mg | ORAL_TABLET | Freq: Every day | ORAL | Status: DC
  Administered 2013-02-22: 20 mg via ORAL
  Filled 2013-02-21 (×2): qty 1

## 2013-02-21 MED ORDER — MORPHINE SULFATE 2 MG/ML IJ SOLN
1.0000 mg | INTRAMUSCULAR | Status: DC | PRN
  Administered 2013-02-21: 4 mg via INTRAVENOUS
  Filled 2013-02-21: qty 2

## 2013-02-21 MED ORDER — ONDANSETRON HCL 4 MG/2ML IJ SOLN
4.0000 mg | INTRAMUSCULAR | Status: DC | PRN
  Administered 2013-02-21: 4 mg via INTRAVENOUS
  Filled 2013-02-21: qty 2

## 2013-02-21 MED ORDER — HEMOSTATIC AGENTS (NO CHARGE) OPTIME
TOPICAL | Status: DC | PRN
  Administered 2013-02-21: 1 via TOPICAL

## 2013-02-21 MED ORDER — OXYCODONE-ACETAMINOPHEN 5-325 MG PO TABS: 1.0000 | ORAL_TABLET | ORAL | Status: DC | PRN

## 2013-02-21 MED ORDER — ACETAMINOPHEN 650 MG RE SUPP: 650.0000 mg | RECTAL | Status: DC | PRN

## 2013-02-21 MED ORDER — ACETAMINOPHEN 10 MG/ML IV SOLN: 1000.0000 mg | Freq: Once | INTRAVENOUS | Status: DC | PRN

## 2013-02-21 MED ORDER — HYDROMORPHONE HCL PF 1 MG/ML IJ SOLN
INTRAMUSCULAR | Status: AC
  Filled 2013-02-21: qty 1

## 2013-02-21 MED ORDER — THROMBIN 5000 UNITS EX SOLR
CUTANEOUS | Status: DC | PRN
  Administered 2013-02-21 (×2): 5000 [IU] via TOPICAL

## 2013-02-21 MED ORDER — GLYCOPYRROLATE 0.2 MG/ML IJ SOLN
INTRAMUSCULAR | Status: DC | PRN
  Administered 2013-02-21: .5 mg via INTRAVENOUS

## 2013-02-21 MED ORDER — HYDROMORPHONE HCL PF 1 MG/ML IJ SOLN
0.2500 mg | INTRAMUSCULAR | Status: DC | PRN
  Administered 2013-02-21 (×2): 0.5 mg via INTRAVENOUS
  Administered 2013-02-21: 1 mg via INTRAVENOUS

## 2013-02-21 MED ORDER — ROCURONIUM BROMIDE 100 MG/10ML IV SOLN
INTRAVENOUS | Status: DC | PRN
  Administered 2013-02-21: 50 mg via INTRAVENOUS

## 2013-02-21 MED ORDER — LIDOCAINE HCL (CARDIAC) 20 MG/ML IV SOLN
INTRAVENOUS | Status: DC | PRN
  Administered 2013-02-21: 50 mg via INTRAVENOUS

## 2013-02-21 MED ORDER — SCOPOLAMINE 1 MG/3DAYS TD PT72
MEDICATED_PATCH | TRANSDERMAL | Status: DC | PRN
  Administered 2013-02-21: 1 via TRANSDERMAL

## 2013-02-21 MED ORDER — SENNA 8.6 MG PO TABS
1.0000 | ORAL_TABLET | Freq: Two times a day (BID) | ORAL | Status: DC
  Administered 2013-02-21 – 2013-02-22 (×2): 8.6 mg via ORAL
  Filled 2013-02-21 (×3): qty 1

## 2013-02-21 MED ORDER — MIDAZOLAM HCL 5 MG/5ML IJ SOLN
INTRAMUSCULAR | Status: DC | PRN
  Administered 2013-02-21: 2 mg via INTRAVENOUS

## 2013-02-21 MED ORDER — SODIUM CHLORIDE 0.9 % IJ SOLN
3.0000 mL | Freq: Two times a day (BID) | INTRAMUSCULAR | Status: DC
  Administered 2013-02-21 – 2013-02-22 (×2): 3 mL via INTRAVENOUS

## 2013-02-21 MED ORDER — SCOPOLAMINE 1 MG/3DAYS TD PT72
MEDICATED_PATCH | TRANSDERMAL | Status: AC
  Filled 2013-02-21: qty 1

## 2013-02-21 MED ORDER — MENTHOL 3 MG MT LOZG: 1.0000 | LOZENGE | OROMUCOSAL | Status: DC | PRN

## 2013-02-21 MED ORDER — 0.9 % SODIUM CHLORIDE (POUR BTL) OPTIME
TOPICAL | Status: DC | PRN
  Administered 2013-02-21: 1000 mL

## 2013-02-21 SURGICAL SUPPLY — 53 items
ADH SKN CLS APL DERMABOND .7 (GAUZE/BANDAGES/DRESSINGS) ×1
APL SKNCLS STERI-STRIP NONHPOA (GAUZE/BANDAGES/DRESSINGS)
BAG DECANTER FOR FLEXI CONT (MISCELLANEOUS) ×2 IMPLANT
BENZOIN TINCTURE PRP APPL 2/3 (GAUZE/BANDAGES/DRESSINGS) IMPLANT
BLADE SURG ROTATE 9660 (MISCELLANEOUS) IMPLANT
BUR MATCHSTICK NEURO 3.0 LAGG (BURR) ×2 IMPLANT
CANISTER SUCTION 2500CC (MISCELLANEOUS) ×2 IMPLANT
CLOTH BEACON ORANGE TIMEOUT ST (SAFETY) ×2 IMPLANT
CONT SPEC 4OZ CLIKSEAL STRL BL (MISCELLANEOUS) ×2 IMPLANT
DECANTER SPIKE VIAL GLASS SM (MISCELLANEOUS) ×2 IMPLANT
DERMABOND ADVANCED (GAUZE/BANDAGES/DRESSINGS) ×1
DERMABOND ADVANCED .7 DNX12 (GAUZE/BANDAGES/DRESSINGS) ×1 IMPLANT
DRAPE LAPAROTOMY 100X72X124 (DRAPES) ×2 IMPLANT
DRAPE MICROSCOPE LEICA (MISCELLANEOUS) ×2 IMPLANT
DRAPE POUCH INSTRU U-SHP 10X18 (DRAPES) ×2 IMPLANT
DRAPE SURG 17X23 STRL (DRAPES) ×2 IMPLANT
DURAPREP 26ML APPLICATOR (WOUND CARE) ×2 IMPLANT
ELECT REM PT RETURN 9FT ADLT (ELECTROSURGICAL) ×2
ELECTRODE REM PT RTRN 9FT ADLT (ELECTROSURGICAL) ×1 IMPLANT
GAUZE SPONGE 4X4 16PLY XRAY LF (GAUZE/BANDAGES/DRESSINGS) IMPLANT
GLOVE BIOGEL PI IND STRL 8 (GLOVE) IMPLANT
GLOVE BIOGEL PI INDICATOR 8 (GLOVE) ×1
GLOVE ECLIPSE 6.5 STRL STRAW (GLOVE) ×2 IMPLANT
GLOVE ECLIPSE 7.5 STRL STRAW (GLOVE) ×2 IMPLANT
GLOVE EXAM NITRILE LRG STRL (GLOVE) IMPLANT
GLOVE EXAM NITRILE MD LF STRL (GLOVE) IMPLANT
GLOVE EXAM NITRILE XL STR (GLOVE) IMPLANT
GLOVE EXAM NITRILE XS STR PU (GLOVE) IMPLANT
GOWN BRE IMP SLV AUR LG STRL (GOWN DISPOSABLE) ×4 IMPLANT
GOWN BRE IMP SLV AUR XL STRL (GOWN DISPOSABLE) ×1 IMPLANT
GOWN STRL REIN 2XL LVL4 (GOWN DISPOSABLE) IMPLANT
KIT BASIN OR (CUSTOM PROCEDURE TRAY) ×2 IMPLANT
KIT ROOM TURNOVER OR (KITS) ×2 IMPLANT
NDL HYPO 25X1 1.5 SAFETY (NEEDLE) ×1 IMPLANT
NDL SPNL 18GX3.5 QUINCKE PK (NEEDLE) IMPLANT
NEEDLE HYPO 25X1 1.5 SAFETY (NEEDLE) ×2 IMPLANT
NEEDLE SPNL 18GX3.5 QUINCKE PK (NEEDLE) ×2 IMPLANT
NS IRRIG 1000ML POUR BTL (IV SOLUTION) ×2 IMPLANT
PACK LAMINECTOMY NEURO (CUSTOM PROCEDURE TRAY) ×2 IMPLANT
PAD ARMBOARD 7.5X6 YLW CONV (MISCELLANEOUS) ×6 IMPLANT
RUBBERBAND STERILE (MISCELLANEOUS) ×4 IMPLANT
SPONGE GAUZE 4X4 12PLY (GAUZE/BANDAGES/DRESSINGS) IMPLANT
SPONGE LAP 4X18 X RAY DECT (DISPOSABLE) IMPLANT
SPONGE SURGIFOAM ABS GEL SZ50 (HEMOSTASIS) ×2 IMPLANT
STRIP CLOSURE SKIN 1/2X4 (GAUZE/BANDAGES/DRESSINGS) IMPLANT
SUT VIC AB 0 CT1 18XCR BRD8 (SUTURE) ×1 IMPLANT
SUT VIC AB 0 CT1 8-18 (SUTURE) ×2
SUT VIC AB 2-0 CT1 18 (SUTURE) ×2 IMPLANT
SUT VIC AB 3-0 SH 8-18 (SUTURE) ×3 IMPLANT
SYR 20ML ECCENTRIC (SYRINGE) ×2 IMPLANT
TOWEL OR 17X24 6PK STRL BLUE (TOWEL DISPOSABLE) ×2 IMPLANT
TOWEL OR 17X26 10 PK STRL BLUE (TOWEL DISPOSABLE) ×2 IMPLANT
WATER STERILE IRR 1000ML POUR (IV SOLUTION) ×2 IMPLANT

## 2013-02-21 NOTE — H&P (Signed)
BP 141/97  Pulse 77  Temp(Src) 97.1 F (36.2 C) (Oral)  Resp 18  SpO2 99%    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  She present today for evaluation of pain that she has had for 4-5 months in her left lower extremity.  She has weakness in the left leg, pain in the back and leg, weakness in that when she is getting in and out of a car sometimes she is lifting her left leg with her arms.  This pain has been ongoing since March.  It first started in the hip and extended into the left lower extremity.  She has had difficulty going up stairs.  She does not remember anything in particular prior to this starting.  She said it is actually worse when she lies down.  Dr. Renae Davenport has given her a Kenalog tripper point injection for the pain in the joint and pelvis.  She says that did not do much but may have helped her slightly.  She has had no bowel or bladder dysfunction though she has had some urinary urgency. She has some numbness and tingling in the left foot and hand, weakness in the leg and back, and some pain in her arm.    PAST MEDICAL HISTORY:                              Significant for atrial fibrillation, pulmonary embolus, deep venous thromboses and premature ventricular contractions.   FAMILY HISTORY:                                                             Her mother is 63 with diabetes and hypertension.  Father is deceased of a myocardial infarction at 57 years of age.    PAST SURGICAL HISTORY:                            She has undergone a diskectomy at L3-4, cardiac ablation and uterine artery embolization.    ALLERGIES:                                                         SHE IS ALLERGIC TO SULFUR MEDICATIONS AND CODEINE.    MEDICATIONS:                                                 Bystolic and Coumadin.   SOCIAL HISTORY:                                                              She does not smoke.  She does drink alcohol.  She does not use  illicit drugs.  5'9" tall. Weight is 225 pounds.   REVIEW OF SYSTEMS:                                     Positive for eye glasses, sinus problems, chest pain, swelling in the feet, leg pain while walking, urinary incontinence, arm weakness, leg weakness, arm pain, leg pain.  She denies allergic, hematologic, endocrine, psychiatric, neurological, skin, GE, respiratory, or constitutional problems.   PHYSICAL EXAMINATION:                            On physical exam, she is alert and oriented by four and answers all questions appropriately.  She has negative straight leg raising.  She has difficulty  taking a 14 inch step on the left side.  Weakness on manual exam in the left hip flexors and quadriceps about 4/5.  She has 2+ reflexes in biceps, triceps, brachioradialis, knees and ankles.  Downgoing toes to plantar stimulation.  She has normal muscle tone, bulk and coordination.  Gait is otherwise normal.    DIAGNOSTIC STUDIES:                                   MRI is reviewed.  It shows a disc at L3-4 on the left side.  I can't see a laminectomy defect for which she had surgery previously.  That is about the only level that has any significant problem.  This is narrowed and that is why I am making the assumption that she had previous surgery there.  The conus and cauda are normal.  Paraspinous soft tissues are normal.   IMPRESSION AND PLAN: BP 141/97  Pulse 77  Temp(Src) 97.1 F (36.2 C) (Oral)  Resp 18  SpO2 99% [name] has decided to undergo a lumbar discetomy/decompression for a disc herniation at levels L3/4. Risks and benefits including but not limited to bleeding, infection, paralysis, weakness in one or both extremities, bowel and/or bladder dysfunction, need for further surgery, no relief of pain. [name] understands and wishes to proceed.

## 2013-02-21 NOTE — Progress Notes (Signed)
INR on 02/13/13 was 1.66, PT was 19.1 and APTT was 35. Nurse called Dr. Gypsy Balsam to inquire if patient needed to have coags repeated after she had a INR drawn on 02/15/13 at her physicians office and it was 1.4; encounter is in EPIC. Dr. Gypsy Balsam stated that as long as the results were in EPIC then the INR did not need to be repeated. Will discontinue orders entered by Revonda Standard, [place] and inform patient of this.

## 2013-02-21 NOTE — Plan of Care (Signed)
Problem: Consults Goal: Diagnosis - Spinal Surgery Outcome: Completed/Met Date Met:  02/21/13 Lumbar Laminectomy (Complex)     

## 2013-02-21 NOTE — Anesthesia Procedure Notes (Signed)
Procedure Name: Intubation Date/Time: 02/21/2013 9:32 AM Performed by: Pamela Davenport Pre-anesthesia Checklist: Patient identified, Emergency Drugs available, Suction available and Timeout performed Patient Re-evaluated:Patient Re-evaluated prior to inductionOxygen Delivery Method: Circle system utilized Preoxygenation: Pre-oxygenation with 100% oxygen Intubation Type: IV induction Ventilation: Mask ventilation without difficulty and Oral airway inserted - appropriate to patient size Laryngoscope Size: Miller and 2 Grade View: Grade I Tube type: Oral Tube size: 7.0 mm Number of attempts: 1 Airway Equipment and Method: Stylet Placement Confirmation: ETT inserted through vocal cords under direct vision,  positive ETCO2 and breath sounds checked- equal and bilateral Secured at: 22 cm Tube secured with: Tape Dental Injury: Teeth and Oropharynx as per pre-operative assessment

## 2013-02-21 NOTE — Op Note (Signed)
02/21/2013  10:44 AM  PATIENT:  Pamela Davenport  57 y.o. female who presents with pain in the left lower extremity and lateral recess stenosis at L3/4 on the left, with weakness in the left hip flexors.  PRE-OPERATIVE DIAGNOSIS:  lumbar herniated disc lumbar spondylosis L3/4  POST-OPERATIVE DIAGNOSIS:  lumbar herniated disc lumbar spondylosis L3/4  PROCEDURE:  Procedure(s): Redo LUMBAR semihemi LAMINECTOMY/DECOMPRESSION  1 LEVEL Left 3/4 Microdissection  SURGEON:  Surgeon(s): Carmela Hurt, MD  ASSISTANTS:none  ANESTHESIA:   general  EBL:  Total I/O In: 1500 [I.V.:1500] Out: -   BLOOD ADMINISTERED:none  CELL SAVER GIVEN:none  COUNT:per nursing  DRAINS: none   SPECIMEN:  No Specimen  DICTATION: [name] was taken to the operating room, intubated, and placed under a general anesthetic. She was positioned prone on a Wilson frame with all pressure points padded. Her back was prepped and draped in a sterile manner. I infiltrated 10cc lidocaine into the previous incision. I opened the incision with a 10 blade and dissected sharply down to the thoracolumbar fascia. I used monopolar cautery to expose the lamina of L3 and L4. I confirmed my location with an intraoperative xray. I used the drill to perform a redo semihemilaminectomy of L3. I removed the ligamentum flavum and exposed the thecal sac. With microscopic dissection I performed a partial facetectomy and used curved curettes to remove thickened ligament to open the lateral recess. I did inspect the disc and while it was heaped it was not directly compressing the nerve(L3). I used the Kerrison to open the space around the nerve to fully decompress it. I achieved hemostasis then irrigated. I closed the wound with vicryl sutures and used dermabond for a sterile dressing.   PLAN OF CARE: Admit to inpatient   PATIENT DISPOSITION:  PACU - hemodynamically stable.   Delay start of Pharmacological VTE agent (>24hrs) due to surgical  blood loss or risk of bleeding:  yes

## 2013-02-21 NOTE — Transfer of Care (Signed)
Immediate Anesthesia Transfer of Care Note  Patient: Pamela Davenport  Procedure(s) Performed: Procedure(s) with comments: LUMBAR LAMINECTOMY/DECOMPRESSION MICRODISCECTOMY 1 LEVEL (Left) - LEFT Lumbar Three-Four Laminotomy foraminotomy microdiskectomy  Patient Location: PACU  Anesthesia Type:General  Level of Consciousness: awake  Airway & Oxygen Therapy: Patient Spontanous Breathing and Patient connected to face mask oxygen  Post-op Assessment: Report given to PACU RN and Post -op Vital signs reviewed and stable  Post vital signs: Reviewed and stable  Complications: No apparent anesthesia complications

## 2013-02-21 NOTE — Anesthesia Postprocedure Evaluation (Signed)
  Anesthesia Post-op Note  Patient: Pamela Davenport  Procedure(s) Performed: Procedure(s) with comments: LUMBAR LAMINECTOMY/DECOMPRESSION MICRODISCECTOMY 1 LEVEL (Left) - LEFT Lumbar Three-Four Laminotomy foraminotomy microdiskectomy  Patient Location: PACU  Anesthesia Type:General  Level of Consciousness: awake, alert  and oriented  Airway and Oxygen Therapy: Patient Spontanous Breathing and Patient connected to nasal cannula oxygen  Post-op Pain: mild  Post-op Assessment: Post-op Vital signs reviewed, Patient's Cardiovascular Status Stable, Respiratory Function Stable, Patent Airway and Pain level controlled  Post-op Vital Signs: stable  Complications: No apparent anesthesia complications

## 2013-02-21 NOTE — Preoperative (Signed)
Beta Blockers   Reason not to administer Beta Blockers:Not Applicable 

## 2013-02-21 NOTE — Anesthesia Preprocedure Evaluation (Addendum)
Anesthesia Evaluation  Patient identified by MRN, date of birth, ID band Patient awake    Reviewed: Allergy & Precautions, H&P , NPO status , Patient's Chart, lab work & pertinent test results, reviewed documented beta blocker date and time   History of Anesthesia Complications (+) PONV  Airway Mallampati: II TM Distance: >3 FB     Dental  (+) Teeth Intact and Dental Advisory Given   Pulmonary  breath sounds clear to auscultation        Cardiovascular + Peripheral Vascular Disease + dysrhythmias Atrial Fibrillation Rhythm:Regular Rate:Normal  PAF   Neuro/Psych    GI/Hepatic   Endo/Other    Renal/GU      Musculoskeletal   Abdominal   Peds  Hematology   Anesthesia Other Findings   Reproductive/Obstetrics                          Anesthesia Physical Anesthesia Plan  ASA: III  Anesthesia Plan: General   Post-op Pain Management:    Induction: Intravenous  Airway Management Planned: Oral ETT  Additional Equipment:   Intra-op Plan:   Post-operative Plan: Extubation in OR  Informed Consent: I have reviewed the patients History and Physical, chart, labs and discussed the procedure including the risks, benefits and alternatives for the proposed anesthesia with the patient or authorized representative who has indicated his/her understanding and acceptance.   Dental advisory given  Plan Discussed with: CRNA, Anesthesiologist and Surgeon  Anesthesia Plan Comments:         Anesthesia Quick Evaluation

## 2013-02-22 MED ORDER — HYDROCODONE-ACETAMINOPHEN 5-325 MG PO TABS: 1.0000 | ORAL_TABLET | Freq: Four times a day (QID) | ORAL | Status: DC | PRN

## 2013-02-22 MED ORDER — CYCLOBENZAPRINE HCL 10 MG PO TABS: 10.0000 mg | ORAL_TABLET | Freq: Three times a day (TID) | ORAL | Status: DC | PRN

## 2013-02-22 NOTE — Progress Notes (Signed)
Pt given D/C instructions with Rx's, verbal understanding given. Pt D/C'd home via wheelchair @ 1145 per MD order. Rema Fendt, RN

## 2013-02-22 NOTE — Evaluation (Signed)
Physical Therapy Evaluation Patient Details Name: Pamela Davenport MRN: [id number] DOB: 1956-05-29 Today's Date: 02/22/2013 Time: 0845-0900 PT Time Calculation (min): 15 min  PT Assessment / Plan / Recommendation History of Present Illness  Patient is a 57 y/o female s/p L3-4 lami decompression/microdiscectomy due to back pain and left LE weakness and pain.   Clinical Impression  Patient presents independent/modified independent with mobility and is able to state precautions.  Also reviewed and demo technique for car transfers.    PT Assessment  Patent does not need any further PT services    Follow Up Recommendations  No PT follow up          Equipment Recommendations  None recommended by PT          Precautions / Restrictions Precautions Precautions: Back Precaution Comments: Patient verbalized precautions with min questioning cues   Pertinent Vitals/Pain 2/10 surgical pain      Mobility  Bed Mobility Bed Mobility: Rolling Right;Right Sidelying to Sit;Sitting - Scoot to [place] of Bed;Sit to Sidelying Right Rolling Right: 6: Modified independent (Device/Increase time) Right Sidelying to Sit: 6: Modified independent (Device/Increase time) Sitting - Scoot to Edge of Bed: 6: Modified independent (Device/Increase time) Sit to Sidelying Right: 6: Modified independent (Device/Increase time) Transfers Sit to Stand: 6: Modified independent (Device/Increase time);From bed;From toilet;With upper extremity assist Stand to Sit: 6: Modified independent (Device/Increase time);To bed;To toilet;With upper extremity assist Ambulation/Gait Ambulation/Gait Assistance: 7: Independent Ambulation Distance (Feet): 80 Feet Assistive device: None Ambulation/Gait Assistance Details: slow pace, but gait WNL Gait Pattern: Within Functional Limits Stairs: Yes Stairs Assistance: 5: Supervision Stairs Assistance Details (indicate cue type and reason): cues for step to sequence with stronger leg up  and weaker leg down first Stair Management Technique: Step to pattern;One rail Left;Forwards Number of Stairs: 10       PT Goals(Current goals can be found in the care plan section) Acute Rehab PT Goals PT Goal Formulation: No goals set, d/c therapy  Visit Information  Last PT Received On: 02/22/13 Assistance Needed: +1 History of Present Illness: Patient is a 57 y/o female s/p L3-4 lami decompression/microdiscectomy due to back pain and left LE weakness and pain.        Prior Functioning  Home Living Family/patient expects to be discharged to:: Private residence Living Arrangements: Other relatives Available Help at Discharge: Family;Available 24 hours/day Type of Home: House Home Access: Level entry Home Layout: Two level;Bed/bath upstairs Alternate Level Stairs-Number of Steps: 13 Alternate Level Stairs-Rails: Right;Left;Can reach both Home Equipment:  (rail on toilet) Additional Comments: rails beside toilet Prior Function Level of Independence: Independent Communication Communication: No difficulties Dominant Hand: Right    Cognition  Cognition Arousal/Alertness: Awake/alert Behavior During Therapy: WFL for tasks assessed/performed Overall Cognitive Status: Within Functional Limits for tasks assessed    Extremity/Trunk Assessment Upper Extremity Assessment Upper Extremity Assessment: Overall WFL for tasks assessed Lower Extremity Assessment Lower Extremity Assessment: Overall WFL for tasks assessed Cervical / Trunk Assessment Cervical / Trunk Assessment: Normal   Balance Balance Balance Assessed: Yes Static Standing Balance Static Standing - Balance Support: No upper extremity supported Static Standing - Level of Assistance: 7: Independent  End of Session PT - End of Session Equipment Utilized During Treatment: Gait belt Activity Tolerance: Patient tolerated treatment well Patient left: in bed;with call bell/phone within reach;with family/visitor present   GP Functional Assessment Tool Used: clinical judgement Functional Limitation: Self care Self Care Current Status (N8295): At least 1 percent but less than 20 percent  impaired, limited or restricted Self Care Goal Status [phone]): 0 percent impaired, limited or restricted Self Care Discharge Status [phone]): 0 percent impaired, limited or restricted   [facility] 02/22/2013, 10:50 AM Sheran Lawless, PT [phone] 02/22/2013

## 2013-02-22 NOTE — Discharge Summary (Signed)
Physician Discharge Summary  Patient ID: Pamela Davenport MRN: [id number] DOB/AGE: 03-27-1956 57 y.o.  Admit date: 02/21/2013 Discharge date: 02/22/2013  Admission Diagnoses:Lateral recess stenosis L3/4, lumbar herniated disc lumbar spondylosis L3/4   Discharge Diagnoses: Lateral recess stenosis L3/4, lumbar herniated disc lumbar spondylosis L3/4 Active Problems:   * No active hospital problems. *   Discharged Condition: good  Hospital Course: [name] was admitted to the hospital and taken to the operating room. She had an uncomplicated decompression of the Left L3/4 lateral recess. Post op she has ambulated, voided, and tolerated a regular diet. She will be discharged home today.   Consults: None  Significant Diagnostic Studies: none  Treatments: surgery: LUMBAR semihemi LAMINECTOMY/DECOMPRESSION  1 LEVEL Left 3/4 Microdissection   Discharge Exam: Blood pressure 129/77, pulse 83, temperature 98.4 F (36.9 C), temperature source Oral, resp. rate 16, SpO2 96.00%. Head: Normocephalic, without obvious abnormality, atraumatic Neurologic: Alert and oriented X 3, normal strength and tone. Normal symmetric reflexes. Normal coordination and gait  Disposition: 01-Home or Self Care   Future Appointments Provider Department Dept Phone   02/26/2013 11:45 AM [facility] [phone]       Medication List    TAKE these medications       cyclobenzaprine 10 MG tablet  Commonly known as:  FLEXERIL  Take 1 tablet (10 mg total) by mouth 3 (three) times daily as needed for muscle spasms.     enoxaparin 150 MG/ML injection  Commonly known as:  LOVENOX  Inject 1 mL (150 mg total) into the skin daily.     HYDROcodone-acetaminophen 5-325 MG per tablet  Commonly known as:  NORCO/VICODIN  Take 1 tablet by mouth every 6 (six) hours as needed.     ibuprofen 200 MG tablet  Commonly known as:  ADVIL,MOTRIN  Take 600 mg by mouth every 6  (six) hours as needed for pain.     nadolol 20 MG tablet  Commonly known as:  CORGARD  Take 20 mg by mouth daily.     warfarin 5 MG tablet  Commonly known as:  COUMADIN  Take 5 mg by mouth See admin instructions. 7.5 mg on Mon, and Fri.  5 mg on Tues., Wed., Thurs., Sat., and Sunday.           Follow-up Information   Follow up with Dianca Owensby L, MD In 3 weeks. (call to make appointment)    Contact information:   [address], [address]                         [address] [place] [address] [phone]       Signed: Nykira Reddix L 02/22/2013, 8:02 PM

## 2013-02-22 NOTE — Evaluation (Signed)
Occupational Therapy Evaluation Patient Details Name: Pamela Davenport MRN: [id number] DOB: 02/15/1956 Today's Date: 02/22/2013 Time: 4098-1191 OT Time Calculation (min): 18 min  OT Assessment / Plan / Recommendation History of present illness     Clinical Impression  Pt s/p L3-4 lami/decompression microdiscectomy.  Education completed, and pt has necessary level of assist at home.  No further acute OT needs at this time.    OT Assessment  Patient does not need any further OT services    Follow Up Recommendations  No OT follow up;Supervision - Intermittent    Barriers to Discharge      Equipment Recommendations  None recommended by OT    Recommendations for Other Services    Frequency       Precautions / Restrictions Precautions Precautions: Back Precaution Comments: Educated pt and family on 3/3 back precautions.   Pertinent Vitals/Pain See vitals    ADL  Eating/Feeding: Performed;Independent Where Assessed - Eating/Feeding: Edge of bed Grooming: Performed;Wash/dry hands;Modified independent Where Assessed - Grooming: Unsupported standing Upper Body Bathing: Simulated;Modified independent Where Assessed - Upper Body Bathing: Unsupported sitting Lower Body Bathing: Simulated;Modified independent Where Assessed - Lower Body Bathing: Unsupported sit to stand Upper Body Dressing: Simulated;Modified independent Where Assessed - Upper Body Dressing: Unsupported sitting Lower Body Dressing: Simulated;Minimal assistance Where Assessed - Lower Body Dressing: Unsupported sit to stand Toilet Transfer: Performed;Modified independent Toilet Transfer Method:  (ambulating) Toilet Transfer Equipment: Comfort height toilet Toileting - Clothing Manipulation and Hygiene: Performed;Modified independent Where Assessed - Toileting Clothing Manipulation and Hygiene: Sit to stand from 3-in-1 or toilet Transfers/Ambulation Related to ADLs: mod I ambulating in room and hall ADL Comments: Pt  with difficulty crossing ankles over knees to reach socks but would be able to don undergarment/pants. States her family can assist as needed.      OT Diagnosis:    OT Problem List:   OT Treatment Interventions:     OT Goals(Current goals can be found in the care plan section) Acute Rehab OT Goals Potential to Achieve Goals: Good  Visit Information  Last OT Received On: 02/22/13 Assistance Needed: +1       Prior Functioning     Home Living Family/patient expects to be discharged to:: Private residence Living Arrangements: Other relatives Available Help at Discharge: Family;Available 24 hours/day Type of Home: House Home Access: Level entry Home Layout: Two level;Bed/bath upstairs Alternate Level Stairs-Number of Steps: 13 Alternate Level Stairs-Rails: Right;Left;Can reach both (right rail only part of way) Home Equipment:  (rail on toilet) Prior Function Level of Independence: Independent Communication Communication: No difficulties Dominant Hand: Right         Vision/Perception     Cognition  Cognition Arousal/Alertness: Awake/alert Behavior During Therapy: WFL for tasks assessed/performed Overall Cognitive Status: Within Functional Limits for tasks assessed    Extremity/Trunk Assessment Upper Extremity Assessment Upper Extremity Assessment: Overall WFL for tasks assessed     Mobility Bed Mobility Bed Mobility: Rolling Right;Right Sidelying to Sit;Sitting - Scoot to [place] of Bed;Sit to Sidelying Right Rolling Right: 6: Modified independent (Device/Increase time) Right Sidelying to Sit: 6: Modified independent (Device/Increase time) Sitting - Scoot to Edge of Bed: 6: Modified independent (Device/Increase time) Sit to Sidelying Right: 6: Modified independent (Device/Increase time) Transfers Transfers: Sit to Stand;Stand to Sit Sit to Stand: 6: Modified independent (Device/Increase time);From bed;From toilet;With upper extremity assist Stand to Sit: 6:  Modified independent (Device/Increase time);To bed;To toilet;With upper extremity assist     Exercise     Balance  End of Session OT - End of Session Activity Tolerance: Patient tolerated treatment well Patient left: in bed;with call bell/phone within reach;with family/visitor present Nurse Communication: Mobility status  GO Functional Assessment Tool Used: clinical judgement Functional Limitation: Self care Self Care Current Status (N8295): At least 1 percent but less than 20 percent impaired, limited or restricted Self Care Goal Status (A2130): At least 1 percent but less than 20 percent impaired, limited or restricted Self Care Discharge Status [phone]): At least 1 percent but less than 20 percent impaired, limited or restricted   02/22/2013 Cipriano Mile OTR/L Pager [phone] Office [phone]  Cipriano Mile 02/22/2013, 8:42 AM

## 2013-02-23 ENCOUNTER — Encounter (HOSPITAL_COMMUNITY): Payer: Self-pay | Admitting: Neurosurgery

## 2013-02-26 ENCOUNTER — Ambulatory Visit (INDEPENDENT_AMBULATORY_CARE_PROVIDER_SITE_OTHER): Payer: 59 | Admitting: *Deleted

## 2013-02-26 DIAGNOSIS — I4891 Unspecified atrial fibrillation: Secondary | ICD-10-CM

## 2013-02-26 DIAGNOSIS — I2699 Other pulmonary embolism without acute cor pulmonale: Secondary | ICD-10-CM

## 2013-02-26 DIAGNOSIS — I82409 Acute embolism and thrombosis of unspecified deep veins of unspecified lower extremity: Secondary | ICD-10-CM

## 2013-02-26 LAB — POCT INR: INR: 1.2

## 2013-03-01 ENCOUNTER — Ambulatory Visit (INDEPENDENT_AMBULATORY_CARE_PROVIDER_SITE_OTHER): Payer: 59 | Admitting: *Deleted

## 2013-03-01 DIAGNOSIS — I82409 Acute embolism and thrombosis of unspecified deep veins of unspecified lower extremity: Secondary | ICD-10-CM

## 2013-03-01 DIAGNOSIS — I2699 Other pulmonary embolism without acute cor pulmonale: Secondary | ICD-10-CM

## 2013-03-01 DIAGNOSIS — I4891 Unspecified atrial fibrillation: Secondary | ICD-10-CM

## 2013-03-01 LAB — POCT INR: INR: 2.2

## 2013-03-10 ENCOUNTER — Telehealth: Payer: Self-pay | Admitting: Adult Health

## 2013-03-10 NOTE — Telephone Encounter (Signed)
Patient calls stating that she is atrial fib. She has a history of atrial fib, is on coumadin and nadolol. When HR becomes rapid, she has been told to take dose of flecainide, 150 mg tablet (2 tablets), which usually converts it back to NSR.    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 I have advised that she take one dose (one tablet) x1  now of flecainide. If HR increases or she feels worsening symptoms she is to call back. Otherwise take her usual medications as directed.

## 2013-03-11 NOTE — Telephone Encounter (Signed)
Pamela Davenport,  Please followup with the patient on Monday

## 2013-03-20 ENCOUNTER — Other Ambulatory Visit: Payer: Self-pay | Admitting: Internal Medicine

## 2013-03-29 ENCOUNTER — Ambulatory Visit (INDEPENDENT_AMBULATORY_CARE_PROVIDER_SITE_OTHER): Payer: 59 | Admitting: *Deleted

## 2013-03-29 DIAGNOSIS — I4891 Unspecified atrial fibrillation: Secondary | ICD-10-CM

## 2013-03-29 DIAGNOSIS — I2699 Other pulmonary embolism without acute cor pulmonale: Secondary | ICD-10-CM

## 2013-03-29 DIAGNOSIS — I82409 Acute embolism and thrombosis of unspecified deep veins of unspecified lower extremity: Secondary | ICD-10-CM

## 2013-03-29 LAB — POCT INR: INR: 3.9

## 2013-04-26 ENCOUNTER — Ambulatory Visit (INDEPENDENT_AMBULATORY_CARE_PROVIDER_SITE_OTHER): Payer: 59 | Admitting: *Deleted

## 2013-04-26 ENCOUNTER — Encounter: Payer: Self-pay | Admitting: Internal Medicine

## 2013-04-26 ENCOUNTER — Ambulatory Visit (INDEPENDENT_AMBULATORY_CARE_PROVIDER_SITE_OTHER): Payer: 59 | Admitting: Internal Medicine

## 2013-04-26 VITALS — BP 131/79 | HR 76 | Ht 69.0 in | Wt 242.0 lb

## 2013-04-26 DIAGNOSIS — I2699 Other pulmonary embolism without acute cor pulmonale: Secondary | ICD-10-CM

## 2013-04-26 DIAGNOSIS — I4891 Unspecified atrial fibrillation: Secondary | ICD-10-CM

## 2013-04-26 DIAGNOSIS — I4949 Other premature depolarization: Secondary | ICD-10-CM

## 2013-04-26 DIAGNOSIS — R0789 Other chest pain: Secondary | ICD-10-CM

## 2013-04-26 DIAGNOSIS — I82409 Acute embolism and thrombosis of unspecified deep veins of unspecified lower extremity: Secondary | ICD-10-CM

## 2013-04-26 LAB — POCT INR: INR: 3.1

## 2013-04-26 MED ORDER — FLECAINIDE ACETATE 150 MG PO TABS: ORAL_TABLET | ORAL | Status: DC

## 2013-04-26 MED ORDER — NADOLOL 20 MG PO TABS: 20.0000 mg | ORAL_TABLET | Freq: Every day | ORAL | Status: DC

## 2013-04-26 MED ORDER — RIVAROXABAN 20 MG PO TABS: 20.0000 mg | ORAL_TABLET | Freq: Every day | ORAL | Status: DC

## 2013-04-26 NOTE — Patient Instructions (Signed)
Your physician wants you to follow-up in: 6 months with Pamela Davenport in Coumadin Clinic f/u on Xarelto and 12 months with Pamela Davenport will receive a reminder letter in the mail two months in advance. If you don't receive a letter, please call our office to schedule the follow-up appointment.  Your physician has recommended you make the following change in your medication:  1) Stop Warfarin 2) Start Xarelto 20mg  daily

## 2013-04-26 NOTE — Addendum Note (Signed)
Addended by: Dennis Bast F on: 04/26/2013 11:28 AM   Modules accepted: Orders, Medications

## 2013-04-26 NOTE — Progress Notes (Signed)
PCP: Alva Garnet., MD  The patient presents today for routine electrophysiology followup.  Since last being seen in our clinic, the patient reports doing reasonably well.  She recently underwent back surgery 6/14 and continues to recover from this.  She has had only several short episodes of afib.  Her PVCs have mostly resolved.  Today, she denies symptoms of palpitations,shortness of breath, orthopnea, PND, lower extremity edema, dizziness, presyncope, syncope, or neurologic sequela.  The patient feels that she is tolerating medications without difficulties and is otherwise without complaint today.   Past Medical History  Diagnosis Date  . Symptomatic premature ventricular contractions     improved s/p ablation  . Paroxysmal atrial fibrillation   . PE (pulmonary embolism)     chronically anticoagulated with coumadin  . DVT (deep venous thrombosis)     recurrent; chronically anticoagulated with coumadin  . Strep throat at age 30    The patient reports a severe strep throat infection at age 42 and   is not clear as to whether or not she may have had rheumatic fever.  Marland Kitchen Uterine bleeding 2008    Uterine artery embolization in 2008 for uterine bleeding.  Marland Kitchen Atypical chest pain     normal myoview 11/11/10  . PONV (postoperative nausea and vomiting)   . Herniated lumbar intervertebral disc   . Family history of anesthesia complication     Sister had decrease in respirations after surgery  . Pneumonia 07/20/12  . Urgency of urination   . Sickle cell trait    Past Surgical History  Procedure Laterality Date  . Uterine artery embolization  2008    for uterine bleeding  . Pvc ablation  2010  . Diagnostic d&c hysteroscopy and novasure ablation  03/17/2004  . Vascular surgery      laser of left leg  . Dilation and curettage of uterus  12/17/2011    Procedure: DILATATION AND CURETTAGE;  Surgeon: Kathreen Cosier, MD;  Location: [facility];  Service: Gynecology;  Laterality: N/A;  With  Attempted Hydrothermal Ablation  . Tonsillectomy    . Colonoscopy w/ polypectomy    . Mandible surgery      under bite  . Lumbar laminectomy/decompression microdiscectomy Left 02/21/2013    Procedure: LUMBAR LAMINECTOMY/DECOMPRESSION MICRODISCECTOMY 1 LEVEL;  Surgeon: Carmela Hurt, MD;  Location: [facility];  Service: Neurosurgery;  Laterality: Left;  LEFT Lumbar Three-Four Laminotomy foraminotomy microdiskectomy    Current Outpatient Prescriptions  Medication Sig Dispense Refill  . cyclobenzaprine (FLEXERIL) 10 MG tablet Take 1 tablet (10 mg total) by mouth 3 (three) times daily as needed for muscle spasms.  45 tablet  0  . HYDROcodone-acetaminophen (NORCO/VICODIN) 5-325 MG per tablet Take 1 tablet by mouth every 6 (six) hours as needed.  70 tablet  0  . ibuprofen (ADVIL,MOTRIN) 200 MG tablet Take 600 mg by mouth every 6 (six) hours as needed for pain.      . nadolol (CORGARD) 20 MG tablet Take 20 mg by mouth daily.      Marland Kitchen warfarin (COUMADIN) 5 MG tablet TAKE 1 TABLET BY MOUTH DAILY AS DIRECTED.  120 tablet  1   No current facility-administered medications for this visit.    Allergies  Allergen Reactions  . Codeine Nausea Only  . Methylprednisolone     REACTION: Atrial fibrillation at end of medrol dose pack  . Sulfonamide Derivatives Rash    History   Social History  . Marital Status: Single    Spouse Name:  N/A    Number of Children: N/A  . Years of Education: N/A   Occupational History  . Nurse    Social History Main Topics  . Smoking status: Former Games developer  . Smokeless tobacco: Not on file  . Alcohol Use: Yes     Comment: rarely  . Drug Use: No  . Sexual Activity: Not on file   Other Topics Concern  . Not on file   Social History Narrative   Works as a Engineer, civil (consulting) at [employer]    Family History  Problem Relation Age of Onset  . Hypertension Mother   . Diabetes Mother    Physical Exam: Filed Vitals:   04/26/13 1055  BP: 131/79  Pulse: 76  Height:  5\' 9"  (1.753 m)  Weight: 242 lb (109.77 kg)    GEN- The patient is well appearing, alert and oriented x 3 today.   Head- normocephalic, atraumatic Eyes-  Sclera clear, conjunctiva pink Ears- hearing intact Oropharynx- clear Neck- supple, no JVP Lymph- no cervical lymphadenopathy Lungs- Clear to ausculation bilaterally, normal work of breathing Heart- Regular rate and rhythm, no murmurs, rubs or gallops, PMI not laterally displaced GI- soft, NT, ND, + BS Extremities- no clubbing, cyanosis, or edema   EKG today reveals sinus rhythm 76 bpm, RsR', otherwise normal ekg  Assessment and Plan:  1. afib Continue flecainide as a "pill in pocket" drug On nadolol Given labine INRs, will switch coumadin to xarelto 20mg  daily Return to anticoagulation clinic for bmet in 6 months  2. PVCs Resolved post ablation  3. Prior PTE Stop coumadin and start xarelto 20mg  daily  Return in 1 year

## 2013-07-05 ENCOUNTER — Other Ambulatory Visit: Payer: Self-pay

## 2013-08-15 ENCOUNTER — Other Ambulatory Visit (HOSPITAL_COMMUNITY): Payer: Self-pay | Admitting: Obstetrics

## 2013-08-15 DIAGNOSIS — Z1231 Encounter for screening mammogram for malignant neoplasm of breast: Secondary | ICD-10-CM

## 2013-08-16 ENCOUNTER — Ambulatory Visit (HOSPITAL_COMMUNITY)
Admission: RE | Admit: 2013-08-16 | Discharge: 2013-08-16 | Disposition: A | Discharge status: Home / Self Care | Payer: 59 | Source: Ambulatory Visit | Attending: Obstetrics | Admitting: Obstetrics

## 2013-08-16 DIAGNOSIS — Z1231 Encounter for screening mammogram for malignant neoplasm of breast: Secondary | ICD-10-CM | POA: Insufficient documentation

## 2013-09-14 ENCOUNTER — Encounter: Payer: Self-pay | Admitting: Internal Medicine

## 2013-10-17 ENCOUNTER — Telehealth: Payer: Self-pay | Admitting: Internal Medicine

## 2013-10-17 NOTE — Telephone Encounter (Signed)
Received request from Nurse fax box, documents faxed for surgical clearance. To: [facility] Fax number: [phone] Attention: [id number]/kdm

## 2013-10-22 ENCOUNTER — Ambulatory Visit (INDEPENDENT_AMBULATORY_CARE_PROVIDER_SITE_OTHER): Payer: 59 | Admitting: *Deleted

## 2013-10-22 DIAGNOSIS — I4891 Unspecified atrial fibrillation: Secondary | ICD-10-CM

## 2013-10-22 LAB — BASIC METABOLIC PANEL
BUN: 19 mg/dL (ref 6–23)
CO2: 29 mEq/L (ref 19–32)
Calcium: 9.6 mg/dL (ref 8.4–10.5)
Chloride: 104 mEq/L (ref 96–112)
Creatinine, Ser: 0.9 mg/dL (ref 0.4–1.2)
GFR: 87.3 mL/min (ref >60.00)
Glucose, Bld: 93 mg/dL (ref 70–99)
Potassium: 4.3 mEq/L (ref 3.5–5.1)
Sodium: 138 mEq/L (ref 135–145)

## 2013-10-22 LAB — CBC
HCT: 38.9 % (ref 36.0–46.0)
Hemoglobin: 12.4 g/dL (ref 12.0–15.0)
MCHC: 31.8 g/dL (ref 30.0–36.0)
MCV: 83.8 fl (ref 78.0–100.0)
Platelets: 245 10*3/uL (ref 150.0–400.0)
RBC: 4.64 Mil/uL (ref 3.87–5.11)
RDW: 14.6 % (ref 11.5–14.6)
WBC: 7.7 10*3/uL (ref 4.5–10.5)

## 2013-10-22 NOTE — Progress Notes (Signed)
Pt was started on Xarelto 20 mg daily  for Atrial Fib on 04/26/2013.    Reviewed patients medication list.  Pt is not currently on any combined P-gp and strong CYP3A4 inhibitors/inducers (ketoconazole, traconazole, ritonavir, carbamazepine, phenytoin, rifampin, St. John's wort).  Reviewed labs.  SCr 0.9 , Weight  111.36kg, CrCl 121.24 .  Dose is appropriate based on CrCl.   Hgb 12.4 and HCT 38.9  A full discussion of the nature of anticoagulants has been carried out.  A benefit/risk analysis has been presented to the patient, so that they understand the justification for choosing anticoagulation with Xarelto at this time.  The need for compliance is stressed.  Pt is aware to take the medication once daily with the largest meal of the day.  Side effects of potential bleeding are discussed, including unusual colored urine or stools, coughing up blood or coffee ground emesis, nose bleeds or serious fall or head trauma.  Discussed signs and symptoms of stroke. The patient should avoid any OTC items containing aspirin or ibuprofen.  Avoid alcohol consumption.   Call if any signs of abnormal bleeding.  Discussed financial obligations and is not having any problems  in obtaining medication.  Next lab test test in 6 months.  Pt states has not had any problems in taking Xarelto . Is scheduled to have colonoscopy March 30 th  Cardiac clearance  has been faxed to Dr Rayann Heman from [place] pt and informed that she is on correct dose of Xarelto 20 mg daily and instructed that have fax with cardiac clearance for Colonoscopy on March 30th 2015 that she is to hold Xarelto 24 hours before colonoscopy and restart  24 hours after colonoscopy and pt states understanding. Appt made for recheck  In 6 months

## 2013-10-23 NOTE — Progress Notes (Signed)
This is for Dr. Rayann Heman

## 2013-11-09 ENCOUNTER — Telehealth: Payer: Self-pay | Admitting: Internal Medicine

## 2013-11-09 NOTE — Telephone Encounter (Signed)
Received request from Nurse fax box, documents faxed for surgical clearance. To: Pamela Davenport  Fax number: [phone] Attention: 3.13.15/kdm

## 2014-01-16 ENCOUNTER — Other Ambulatory Visit (HOSPITAL_COMMUNITY): Payer: Self-pay | Admitting: Orthopedic Surgery

## 2014-01-16 ENCOUNTER — Ambulatory Visit (HOSPITAL_COMMUNITY)
Admission: RE | Admit: 2014-01-16 | Discharge: 2014-01-16 | Disposition: A | Discharge status: Home / Self Care | Payer: 59 | Source: Ambulatory Visit | Attending: Vascular Surgery | Admitting: Vascular Surgery

## 2014-01-16 DIAGNOSIS — M7989 Other specified soft tissue disorders: Secondary | ICD-10-CM

## 2014-04-22 ENCOUNTER — Ambulatory Visit (INDEPENDENT_AMBULATORY_CARE_PROVIDER_SITE_OTHER): Payer: 59 | Admitting: Pharmacist

## 2014-04-22 DIAGNOSIS — I4891 Unspecified atrial fibrillation: Secondary | ICD-10-CM

## 2014-04-22 LAB — BASIC METABOLIC PANEL
BUN: 17 mg/dL (ref 6–23)
CO2: 28 mEq/L (ref 19–32)
Calcium: 9.4 mg/dL (ref 8.4–10.5)
Chloride: 103 mEq/L (ref 96–112)
Creatinine, Ser: 0.9 mg/dL (ref 0.4–1.2)
GFR: 86 mL/min (ref >60.00)
Glucose, Bld: 97 mg/dL (ref 70–99)
Potassium: 4.4 mEq/L (ref 3.5–5.1)
Sodium: 140 mEq/L (ref 135–145)

## 2014-04-22 LAB — CBC
HCT: 38.6 % (ref 36.0–46.0)
Hemoglobin: 12.9 g/dL (ref 12.0–15.0)
MCHC: 33.3 g/dL (ref 30.0–36.0)
MCV: 82.3 fl (ref 78.0–100.0)
Platelets: 245 10*3/uL (ref 150.0–400.0)
RBC: 4.69 Mil/uL (ref 3.87–5.11)
RDW: 13.8 % (ref 11.5–15.5)
WBC: 6.8 10*3/uL (ref 4.0–10.5)

## 2014-04-22 MED ORDER — RIVAROXABAN 20 MG PO TABS: 20.0000 mg | ORAL_TABLET | Freq: Every day | ORAL | Status: DC

## 2014-04-22 NOTE — Progress Notes (Signed)
Pt was started on Xarelto 20 mg for Afib on 04/26/2013.    Reviewed patients medication list.  Pt is not currently on any combined P-gp and strong CYP3A4 inhibitors/inducers (ketoconazole, traconazole, ritonavir, carbamazepine, phenytoin, rifampin, St. John's wort).  Reviewed labs.  SCr 0.9, Weight 251 lbs, CrCl- 120 ml/min.  Dose appropriate based on CrCl.   Hgb and HCT Within Normal Limits  Patient tells me she had one attack of gout months ago and wanted to know if Xarelto could have caused this.  I explained that Xarelto does not cause this, and we reviewed some foods that could trigger this.  Patient otherwise is doing well on Xarelto with no complaints.  The cost of the medication has been $5 or less per month, so is not a concern.  Is due for BMET and CBC today - patient called with results, and informed they were normal.  Will need to come back to see [place] in clinic in 6 months.  Appointment made.  A full discussion of the nature of anticoagulants has been carried out.  A benefit/risk analysis has been presented to the patient, so that they understand the justification for choosing anticoagulation with Xarelto at this time.  The need for compliance is stressed.  Pt is aware to take the medication once daily with the largest meal of the day.  Side effects of potential bleeding are discussed, including unusual colored urine or stools, coughing up blood or coffee ground emesis, nose bleeds or serious fall or head trauma.  Discussed signs and symptoms of stroke. The patient should avoid any OTC items containing aspirin or ibuprofen.  Avoid alcohol consumption.   Call if any signs of abnormal bleeding.  Discussed financial obligations and resolved any difficulty in obtaining medication.  Next lab test test in 6 months.

## 2014-06-14 ENCOUNTER — Other Ambulatory Visit (HOSPITAL_COMMUNITY): Payer: Self-pay | Admitting: Neurosurgery

## 2014-06-14 ENCOUNTER — Other Ambulatory Visit: Payer: Self-pay

## 2014-06-14 DIAGNOSIS — M4716 Other spondylosis with myelopathy, lumbar region: Secondary | ICD-10-CM

## 2014-07-01 ENCOUNTER — Other Ambulatory Visit (HOSPITAL_COMMUNITY): Payer: Self-pay | Admitting: Neurosurgery

## 2014-07-01 ENCOUNTER — Ambulatory Visit (HOSPITAL_COMMUNITY)
Admission: RE | Admit: 2014-07-01 | Discharge: 2014-07-01 | Disposition: A | Discharge status: Home / Self Care | Payer: 59 | Source: Ambulatory Visit | Attending: General Practice | Admitting: General Practice

## 2014-07-01 DIAGNOSIS — M5126 Other intervertebral disc displacement, lumbar region: Secondary | ICD-10-CM | POA: Diagnosis not present

## 2014-07-01 DIAGNOSIS — M4716 Other spondylosis with myelopathy, lumbar region: Secondary | ICD-10-CM

## 2014-07-01 DIAGNOSIS — M4806 Spinal stenosis, lumbar region: Secondary | ICD-10-CM | POA: Diagnosis not present

## 2014-07-01 DIAGNOSIS — M47817 Spondylosis without myelopathy or radiculopathy, lumbosacral region: Secondary | ICD-10-CM | POA: Diagnosis present

## 2014-08-06 ENCOUNTER — Other Ambulatory Visit (HOSPITAL_COMMUNITY): Payer: Self-pay | Admitting: Obstetrics

## 2014-08-06 ENCOUNTER — Other Ambulatory Visit: Payer: Self-pay | Admitting: Family Medicine

## 2014-08-06 DIAGNOSIS — N950 Postmenopausal bleeding: Secondary | ICD-10-CM

## 2014-08-07 ENCOUNTER — Other Ambulatory Visit: Payer: Self-pay | Admitting: Family Medicine

## 2014-08-13 ENCOUNTER — Ambulatory Visit (HOSPITAL_COMMUNITY)
Admission: RE | Admit: 2014-08-13 | Discharge: 2014-08-13 | Disposition: A | Discharge status: Home / Self Care | Payer: 59 | Source: Ambulatory Visit | Attending: Obstetrics | Admitting: Obstetrics

## 2014-08-13 DIAGNOSIS — D259 Leiomyoma of uterus, unspecified: Secondary | ICD-10-CM | POA: Insufficient documentation

## 2014-08-13 DIAGNOSIS — N950 Postmenopausal bleeding: Secondary | ICD-10-CM | POA: Insufficient documentation

## 2014-08-13 DIAGNOSIS — N852 Hypertrophy of uterus: Secondary | ICD-10-CM | POA: Insufficient documentation

## 2014-08-13 DIAGNOSIS — D252 Subserosal leiomyoma of uterus: Secondary | ICD-10-CM | POA: Insufficient documentation

## 2014-08-14 LAB — HM PAP SMEAR: HM Pap smear: NORMAL

## 2014-08-14 LAB — HM MAMMOGRAPHY: HM Mammogram: NORMAL

## 2014-08-15 ENCOUNTER — Other Ambulatory Visit: Payer: Self-pay | Admitting: Family Medicine

## 2014-08-21 ENCOUNTER — Other Ambulatory Visit: Payer: Self-pay | Admitting: Internal Medicine

## 2014-08-21 ENCOUNTER — Other Ambulatory Visit: Payer: Self-pay | Admitting: Family Medicine

## 2014-08-21 MED ORDER — NADOLOL 20 MG PO TABS: 20.0000 mg | ORAL_TABLET | Freq: Every day | ORAL | Status: DC

## 2014-08-22 ENCOUNTER — Other Ambulatory Visit: Payer: Self-pay | Admitting: *Deleted

## 2014-08-22 MED ORDER — NADOLOL 20 MG PO TABS: 20.0000 mg | ORAL_TABLET | Freq: Every day | ORAL | Status: DC

## 2014-09-10 ENCOUNTER — Ambulatory Visit (INDEPENDENT_AMBULATORY_CARE_PROVIDER_SITE_OTHER): Payer: 59

## 2014-09-10 VITALS — BP 156/91 | HR 72 | Resp 18

## 2014-09-10 DIAGNOSIS — M71571 Other bursitis, not elsewhere classified, right ankle and foot: Secondary | ICD-10-CM

## 2014-09-10 DIAGNOSIS — M7671 Peroneal tendinitis, right leg: Secondary | ICD-10-CM

## 2014-09-10 DIAGNOSIS — R52 Pain, unspecified: Secondary | ICD-10-CM

## 2014-09-10 DIAGNOSIS — M7751 Other enthesopathy of right foot: Secondary | ICD-10-CM

## 2014-09-10 MED ORDER — TRIAMCINOLONE ACETONIDE 10 MG/ML IJ SUSP: 10.0000 mg | Freq: Once | INTRAMUSCULAR | Status: DC

## 2014-09-10 NOTE — Patient Instructions (Signed)
ICE INSTRUCTIONS  Apply ice or cold pack to the affected area at least 3 times a day for 10-15 minutes each time.  You should also use ice after prolonged activity or vigorous exercise.  Do not apply ice longer than 20 minutes at one time.  Always keep a cloth between your skin and the ice pack to prevent burns.  Being consistent and following these instructions will help control your symptoms.  We suggest you purchase a gel ice pack because they are reusable and do bit leak.  Some of them are designed to wrap around the area.  Use the method that works best for you.  Here are some other suggestions for icing.   Use a frozen bag of peas or corn-inexpensive and molds well to your body, usually stays frozen for 10 to 20 minutes.  Wet a towel with cold water and squeeze out the excess until it's damp.  Place in a bag in the freezer for 20 minutes. Then remove and use.  Apply ice pack 15 minutes on 15 minutes off every evening to the midfoot area also recommended plain Tylenol as needed for pain

## 2014-09-10 NOTE — Progress Notes (Signed)
   Subjective:    Patient ID: Pamela Davenport, female    DOB: 1956/02/29, 59 y.o.   MRN: [id number]  HPI the right side of my foot hurts and has been going on 8 months now and burns and throbs and no numbness or tingling and I did wear some allergia's shoes with a buckle on it and at first the shoes were tight and now they are stretched out and hurts with any shoes and is sore and tender    Review of Systems  All other systems reviewed and are negative.      Objective:   Physical Exam 59 year old F connecting female well-developed well-nourished oriented 3 presents this time with a several month history of pain with the lateral right midfoot there is bony prominence with fifth metatarsal base patient wearing shoes that may be too narrow and this area advised a straight last shoes such as a new balance or Brooks. At this time his pain both on inversion eversion palpation over the Lisfranc's fourth fifth metatarsal base and cuboid area and along the peroneal tendon insertion site. Is intact with pedal pulses palpable DP and PT +2 over 4 Refill time 3 seconds all digits epicritic and proprioceptive sensations intact and symmetric is a history of DVT 2 and PE on the right side in the past she's taking Zaroxolyn for that. Orthopedic exam unremarkable x-rays reveal no fracture no cysts tumors small os peroneal was identified on oblique view. There is some mild asymmetric joint space narrowing fourth and fifth met and cuboid articulation no other osseous abnormalities identified.       Assessment & Plan:  Assessment Lisfranc's capsulitis as well as possible bursitis of the fifth metatarsal base and peroneal tendinitis. Plan at this time injection to the fourth and fifth met Ace and cuneiform articulation site of Temodar Kenalog 20 mg Xylocaine plain infiltrated at this time recommended ice to the area in a fascial strapping applied to the foot maintain a firm soled shoe no flimsy shoes no barefoot no  flip-flops reevaluate within 1 month if persistent pain may be candidate for orthoses in the future.  Harriet Masson DPM

## 2014-10-08 ENCOUNTER — Ambulatory Visit (INDEPENDENT_AMBULATORY_CARE_PROVIDER_SITE_OTHER): Payer: 59

## 2014-10-08 VITALS — BP 138/80 | HR 82 | Resp 12

## 2014-10-08 DIAGNOSIS — M7671 Peroneal tendinitis, right leg: Secondary | ICD-10-CM

## 2014-10-08 DIAGNOSIS — R52 Pain, unspecified: Secondary | ICD-10-CM

## 2014-10-08 DIAGNOSIS — M71571 Other bursitis, not elsewhere classified, right ankle and foot: Secondary | ICD-10-CM

## 2014-10-08 DIAGNOSIS — M7751 Other enthesopathy of right foot: Secondary | ICD-10-CM

## 2014-10-08 NOTE — Progress Notes (Signed)
   Subjective:    Patient ID: Pamela Davenport, female    DOB: 1956/04/28, 59 y.o.   MRN: [id number]  HPI  ''RT FOOT IS MUCH BETTER AND CAN WEAR SHOES BUT SOMETIMES IT GET A LITTLE SORE.''  Review of Systems no new findings or systemic changes noted     Objective:   Physical Exam Neurovascular status is intact pedal pulses are palpable there is no pain on range of motion dorsiflexion or plantar flexion however there is still tenderness on palpation lateral Lisfranc's fourth fifth metatarsal base and cuboid articulation. There is tenderness in the cuboid somewhat in the sinus tarsi and along the peroneal tendon insertion to the fifth metatarsal base. This is greatly improved since last visit looks athletic shoe. As long she wears a stability shoe solution continue to calm down and heal it has not resolved completely within 2 months. My recommendation is to follow-up with repeat x-ray her MRI to assess soft tissue injury that does not show up on plain x-ray.       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 This should continue to resolve over time continue with Tylenol as needed for pain and provided some significant improvement and strapping provide stability maintain stability shoes if she continues to have difficulty beyond 2 months follow-up as recommended may be candidate for AFO bracing orthoses or possibly even MRI evaluation to assess soft tissue injury that is not resolving  Harriet Masson DPM

## 2014-10-21 ENCOUNTER — Ambulatory Visit (INDEPENDENT_AMBULATORY_CARE_PROVIDER_SITE_OTHER): Payer: 59 | Admitting: *Deleted

## 2014-10-21 DIAGNOSIS — Z5181 Encounter for therapeutic drug level monitoring: Secondary | ICD-10-CM

## 2014-10-21 DIAGNOSIS — I4891 Unspecified atrial fibrillation: Secondary | ICD-10-CM

## 2014-10-21 LAB — BASIC METABOLIC PANEL
BUN: 19 mg/dL (ref 6–23)
CO2: 28 mEq/L (ref 19–32)
Calcium: 9.7 mg/dL (ref 8.4–10.5)
Chloride: 105 mEq/L (ref 96–112)
Creatinine, Ser: 0.84 mg/dL (ref 0.40–1.20)
GFR: 89.4 mL/min (ref >60.00)
Glucose, Bld: 80 mg/dL (ref 70–99)
Potassium: 4.5 mEq/L (ref 3.5–5.1)
Sodium: 138 mEq/L (ref 135–145)

## 2014-10-21 LAB — CBC
HCT: 38 % (ref 36.0–46.0)
Hemoglobin: 12.7 g/dL (ref 12.0–15.0)
MCHC: 33.4 g/dL (ref 30.0–36.0)
MCV: 80.4 fl (ref 78.0–100.0)
Platelets: 266 10*3/uL (ref 150.0–400.0)
RBC: 4.72 Mil/uL (ref 3.87–5.11)
RDW: 14.3 % (ref 11.5–15.5)
WBC: 8.8 10*3/uL (ref 4.0–10.5)

## 2014-10-21 NOTE — Progress Notes (Signed)
Pt was started on Xarelto 20 mg daily for Atrial Fib on 04/26/2013.    Reviewed patients medication list.  Pt is not  currently on any combined P-gp and strong CYP3A4 inhibitors/inducers (ketoconazole, traconazole, ritonavir, carbamazepine, phenytoin, rifampin, St. John's wort).  Reviewed labs.  SCr 0.84 , Weight  113.54kg, CrCl 130.84 .  Dose is appropriate based on CrCl.   Hgb 12.7 and HCT 38.0   A full discussion of the nature of anticoagulants has been carried out.  A benefit/risk analysis has been presented to the patient, so that they understand the justification for choosing anticoagulation with Xarelto at this time.  The need for compliance is stressed.  Pt is aware to take the medication once daily with the largest meal of the day.  Side effects of potential bleeding are discussed, including unusual colored urine or stools, coughing up blood or coffee ground emesis, nose bleeds or serious fall or head trauma.  Discussed signs and symptoms of stroke. The patient should avoid any OTC items containing aspirin or ibuprofen.  Avoid alcohol consumption.   Call if any signs of abnormal bleeding.  Discussed financial obligations and pt states is not having any difficulty in obtaining medication.  Next lab test test in 6 months.  Pt has tolerated Xarelto and has had no sign or symptom of bleeding nor any sign of symptom of stroke Denies any missed doses of Xarelto  Will obtain cbc and bmet today and will call with results 10/22/2014 Spoke with pt and informed that labs are within normal limits and she is to continue same dose of Xarelto 20 mg daily and made an appt to be rechecked in 6 months and pt states understanding.

## 2014-11-05 ENCOUNTER — Ambulatory Visit: Payer: 59 | Admitting: Podiatry

## 2014-11-05 ENCOUNTER — Ambulatory Visit: Payer: 59

## 2015-01-13 ENCOUNTER — Ambulatory Visit (INDEPENDENT_AMBULATORY_CARE_PROVIDER_SITE_OTHER): Payer: 59 | Admitting: Internal Medicine

## 2015-01-13 ENCOUNTER — Encounter: Payer: Self-pay | Admitting: Internal Medicine

## 2015-01-13 ENCOUNTER — Telehealth: Payer: Self-pay | Admitting: General Practice

## 2015-01-13 VITALS — BP 142/90 | HR 83 | Ht 69.0 in | Wt 250.4 lb

## 2015-01-13 DIAGNOSIS — I480 Paroxysmal atrial fibrillation: Secondary | ICD-10-CM

## 2015-01-13 DIAGNOSIS — I10 Essential (primary) hypertension: Secondary | ICD-10-CM | POA: Diagnosis not present

## 2015-01-13 DIAGNOSIS — I493 Ventricular premature depolarization: Secondary | ICD-10-CM

## 2015-01-13 DIAGNOSIS — R072 Precordial pain: Secondary | ICD-10-CM | POA: Diagnosis not present

## 2015-01-13 DIAGNOSIS — R0789 Other chest pain: Secondary | ICD-10-CM

## 2015-01-13 MED ORDER — NADOLOL 20 MG PO TABS: 20.0000 mg | ORAL_TABLET | Freq: Every day | ORAL | Status: DC

## 2015-01-13 MED ORDER — HYDROCHLOROTHIAZIDE 12.5 MG PO CAPS: 12.5000 mg | ORAL_CAPSULE | Freq: Every day | ORAL | Status: DC

## 2015-01-13 MED ORDER — FLECAINIDE ACETATE 150 MG PO TABS: ORAL_TABLET | ORAL | Status: DC

## 2015-01-13 NOTE — Telephone Encounter (Signed)
Patient is requesting to establish care with you. She was referred by Dr. Rayann Heman. Please advise

## 2015-01-13 NOTE — Patient Instructions (Signed)
Medication Instructions:  Your physician has recommended you make the following change in your medication:  1) Start HCTZ 12.5 mg daily    Labwork: None ordered  Testing/Procedures: Your physician has requested that you have a lexiscan myoview. For further information please visit [email or web address]. Please follow instruction sheet, as given.    Follow-Up: Your physician wants you to follow-up in: 12 months with Dr Vallery Ridge will receive a reminder letter in the mail two months in advance. If you don't receive a letter, please call our office to schedule the follow-up appointment.   Any Other Special Instructions Will Be Listed Below (If Applicable).  You have been referred to Dr Scarlette Calico for primary care needs Work on weight reduction  Low-Sodium Eating Plan Sodium raises blood pressure and causes water to be held in the body. Getting less sodium from food will help lower your blood pressure, reduce any swelling, and protect your heart, liver, and kidneys. We get sodium by adding salt (sodium chloride) to food. Most of our sodium comes from canned, boxed, and frozen foods. Restaurant foods, fast foods, and pizza are also very high in sodium. Even if you take medicine to lower your blood pressure or to reduce fluid in your body, getting less sodium from your food is important. WHAT IS MY PLAN? Most people should limit their sodium intake to 2,300 mg a day. Your health care provider recommends that you limit your sodium intake to __________ a day.  WHAT DO I NEED TO KNOW ABOUT THIS EATING PLAN? For the low-sodium eating plan, you will follow these general guidelines:  Choose foods with a % Daily Value for sodium of less than 5% (as listed on the food label).   Use salt-free seasonings or herbs instead of table salt or sea salt.   Check with your health care provider or pharmacist before using salt substitutes.   Eat fresh foods.  Eat more vegetables and fruits.  Limit  canned vegetables. If you do use them, rinse them well to decrease the sodium.   Limit cheese to 1 oz (28 g) per day.   Eat lower-sodium products, often labeled as "lower sodium" or "no salt added."  Avoid foods that contain monosodium glutamate (MSG). MSG is sometimes added to [place] food and some canned foods.  Check food labels (Nutrition Facts labels) on foods to learn how much sodium is in one serving.  Eat more home-cooked food and less restaurant, buffet, and fast food.  When eating at a restaurant, ask that your food be prepared with less salt or none, if possible.  HOW DO I READ FOOD LABELS FOR SODIUM INFORMATION? The Nutrition Facts label lists the amount of sodium in one serving of the food. If you eat more than one serving, you must multiply the listed amount of sodium by the number of servings. Food labels may also identify foods as:  Sodium free--Less than 5 mg in a serving.  Very low sodium--35 mg or less in a serving.  Low sodium--140 mg or less in a serving.  Light in sodium--50% less sodium in a serving. For example, if a food that usually has 300 mg of sodium is changed to become light in sodium, it will have 150 mg of sodium.  Reduced sodium--25% less sodium in a serving. For example, if a food that usually has 400 mg of sodium is changed to reduced sodium, it will have 300 mg of sodium. WHAT FOODS CAN I EAT? Grains Low-sodium cereals, including oats,  puffed wheat and rice, and shredded wheat cereals. Low-sodium crackers. Unsalted rice and pasta. Lower-sodium bread.  Vegetables Frozen or fresh vegetables. Low-sodium or reduced-sodium canned vegetables. Low-sodium or reduced-sodium tomato sauce and paste. Low-sodium or reduced-sodium tomato and vegetable juices.  Fruits Fresh, frozen, and canned fruit. Fruit juice.  Meat and Other Protein Products Low-sodium canned tuna and salmon. Fresh or frozen meat, poultry, seafood, and fish. Lamb. Unsalted nuts.  Dried beans, peas, and lentils without added salt. Unsalted canned beans. Homemade soups without salt. Eggs.  Dairy Milk. Soy milk. Ricotta cheese. Low-sodium or reduced-sodium cheeses. Yogurt.  Condiments Fresh and dried herbs and spices. Salt-free seasonings. Onion and garlic powders. Low-sodium varieties of mustard and ketchup. Lemon juice.  Fats and Oils Reduced-sodium salad dressings. Unsalted butter.  Other Unsalted popcorn and pretzels.  The items listed above may not be a complete list of recommended foods or beverages. Contact your dietitian for more options. WHAT FOODS ARE NOT RECOMMENDED? Grains Instant hot cereals. Bread stuffing, pancake, and biscuit mixes. Croutons. Seasoned rice or pasta mixes. Noodle soup cups. Boxed or frozen macaroni and cheese. Self-rising flour. Regular salted crackers. Vegetables Regular canned vegetables. Regular canned tomato sauce and paste. Regular tomato and vegetable juices. Frozen vegetables in sauces. Salted french fries. Olives. Angie Fava. Relishes. Sauerkraut. Salsa. Meat and Other Protein Products Salted, canned, smoked, spiced, or pickled meats, seafood, or fish. Bacon, ham, sausage, hot dogs, corned beef, chipped beef, and packaged luncheon meats. Salt pork. Jerky. Pickled herring. Anchovies, regular canned tuna, and sardines. Salted nuts. Dairy Processed cheese and cheese spreads. Cheese curds. Blue cheese and cottage cheese. Buttermilk.  Condiments Onion and garlic salt, seasoned salt, table salt, and sea salt. Canned and packaged gravies. Worcestershire sauce. Tartar sauce. Barbecue sauce. Teriyaki sauce. Soy sauce, including reduced sodium. Steak sauce. Fish sauce. Oyster sauce. Cocktail sauce. Horseradish. Regular ketchup and mustard. Meat flavorings and tenderizers. Bouillon cubes. Hot sauce. Tabasco sauce. Marinades. Taco seasonings. Relishes. Fats and Oils Regular salad dressings. Salted butter. Margarine. Ghee. Bacon fat.    Other Potato and tortilla chips. Corn chips and puffs. Salted popcorn and pretzels. Canned or dried soups. Pizza. Frozen entrees and pot pies.  The items listed above may not be a complete list of foods and beverages to avoid. Contact your dietitian for more information. Document Released: 02/05/2002 Document Revised: 08/21/2013 Document Reviewed: 06/20/2013 [facility] Patient Information 2015 [place], [place]. This information is not intended to replace advice given to you by your health care provider. Make sure you discuss any questions you have with your health care provider.

## 2015-01-13 NOTE — Telephone Encounter (Signed)
yes

## 2015-01-13 NOTE — Progress Notes (Signed)
Electrophysiology Office Note   Date:  01/13/2015   ID:  Pamela Davenport, DOB October 28, 1955, MRN [id number]  PCP:  No PCP Per Patient --> referred to Dr Scarlette Calico Primary Electrophysiologist: Thompson Grayer, MD    Chief Complaint  Patient presents with  . Hypertension     History of Present Illness: Pamela Davenport is a 59 y.o. female who presents today for electrophysiology evaluation.   Doing well.  No sustained afib and very infrequent palpitations.   She reports that while in [place] 12/28/14 she had significant increase in her BP (200/90).  She also noticed swelling and chest discomfort at that time.  She describes chest heaviness and L arm pain.  She has had no further discomfort. Today, she denies symptoms shortness of breath, orthopnea, PND, lower extremity edema, claudication, dizziness, presyncope, syncope, bleeding, or neurologic sequela. The patient is tolerating medications without difficulties and is otherwise without complaint today.    Past Medical History  Diagnosis Date  . Symptomatic premature ventricular contractions     improved s/p ablation  . Paroxysmal atrial fibrillation   . PE (pulmonary embolism)     chronically anticoagulated with coumadin  . DVT (deep venous thrombosis)     recurrent; chronically anticoagulated with coumadin  . Strep throat at age 75    The patient reports a severe strep throat infection at age 40 and   is not clear as to whether or not she may have had rheumatic fever.  Marland Kitchen Uterine bleeding 2008    Uterine artery embolization in 2008 for uterine bleeding.  Marland Kitchen Atypical chest pain     normal myoview 11/11/10  . PONV (postoperative nausea and vomiting)   . Herniated lumbar intervertebral disc   . Family history of anesthesia complication     Sister had decrease in respirations after surgery  . Pneumonia 07/20/12  . Urgency of urination   . Sickle cell trait    Past Surgical History  Procedure Laterality Date  . Uterine  artery embolization  2008    for uterine bleeding  . Pvc ablation  2010  . Diagnostic d&c hysteroscopy and novasure ablation  03/17/2004  . Vascular surgery      laser of left leg  . Dilation and curettage of uterus  12/17/2011    Procedure: DILATATION AND CURETTAGE;  Surgeon: Frederico Hamman, MD;  Location: [facility];  Service: Gynecology;  Laterality: N/A;  With Attempted Hydrothermal Ablation  . Tonsillectomy    . Colonoscopy w/ polypectomy    . Mandible surgery      under bite  . Lumbar laminectomy/decompression microdiscectomy Left 02/21/2013    Procedure: LUMBAR LAMINECTOMY/DECOMPRESSION MICRODISCECTOMY 1 LEVEL;  Surgeon: Winfield Cunas, MD;  Location: [facility];  Service: Neurosurgery;  Laterality: Left;  LEFT Lumbar Three-Four Laminotomy foraminotomy microdiskectomy     Current Outpatient Prescriptions  Medication Sig Dispense Refill  . cyclobenzaprine (FLEXERIL) 10 MG tablet Take 1 tablet (10 mg total) by mouth 3 (three) times daily as needed for muscle spasms. 45 tablet 0  . flecainide (TAMBOCOR) 150 MG tablet Take 2 at onset of afib (Patient taking differently: Take 2 tablets by mouth at onset of afib) 10 tablet 0  . ibuprofen (ADVIL,MOTRIN) 200 MG tablet Take 600 mg by mouth every 6 (six) hours as needed for pain.    . nadolol (CORGARD) 20 MG tablet Take 1 tablet (20 mg total) by mouth daily. 30 tablet 6  . rivaroxaban (XARELTO) 20  MG TABS tablet Take 1 tablet (20 mg total) by mouth daily with supper. 30 tablet 11  . VOLTAREN 1 % GEL Apply 2 g topically 4 (four) times daily as needed (pain).   2   Current Facility-Administered Medications  Medication Dose Route Frequency Provider Last Rate Last Dose  . triamcinolone acetonide (KENALOG) 10 MG/ML injection 10 mg  10 mg Other Once Harriet Masson, DPM        Allergies:   Methylprednisolone and Sulfonamide derivatives   Social History:  The patient  reports that she has quit smoking. She does not have any smokeless tobacco  history on file. She reports that she drinks alcohol. She reports that she does not use illicit drugs.   Family History:  The patient's  family history includes Diabetes in her mother; Hypertension in her mother.    ROS:  Please see the history of present illness.   All other systems are reviewed and negative.    PHYSICAL EXAM: VS:  BP 142/90 mmHg  Pulse 83  Ht 5\' 9"  (1.753 m)  Wt 250 lb 6.4 oz (113.581 kg)  BMI 36.96 kg/m2 , BMI Body mass index is 36.96 kg/(m^2). GEN: Well nourished, well developed, in no acute distress HEENT: normal Neck: no JVD, carotid bruits, or masses Cardiac: RRR; no murmurs, rubs, or gallops,no edema  Respiratory:  clear to auscultation bilaterally, normal work of breathing GI: soft, nontender, nondistended, + BS MS: no deformity or atrophy Skin: warm and dry  Neuro:  Strength and sensation are intact Psych: euthymic mood, full affect  EKG:  EKG is ordered today. The ekg ordered today shows sinus rhythm, incomplete RBBB   Recent Labs: 10/21/2014: BUN 19; Creatinine 0.84; Hemoglobin 12.7; Platelets 266.0; Potassium 4.5; Sodium 138    Lipid Panel  No results found for: CHOL, TRIG, HDL, CHOLHDL, VLDL, LDLCALC, LDLDIRECT   Wt Readings from Last 3 Encounters:  01/13/15 250 lb 6.4 oz (113.581 kg)  04/26/13 242 lb (109.77 kg)  07/20/12 235 lb (106.595 kg)      Other studies Reviewed: Additional studies/ records that were reviewed today include: 2/16 labs    ASSESSMENT AND PLAN:  1.  Hypertension Above goal 2 gram sodium diet and weight reduction encouraged Add hctz 12.5mg  daily Will refer to Dr Scarlette Calico  2. pvcs Well controlled  3. afib Well controlled On xarelto given prior h/o pte also Followed in the anticoagulation clinic Labs 2/16 are reviewed  4. Chest discomfort Will order gxt myoview.  Given baseline ekg changes, would order myoview  Current medicines are reviewed at length with the patient today.   The patient does not  have concerns regarding her medicines.  The following changes were made today:  none  Follow-up: establish with primary care, I will see again in 1 year   Signed, Thompson Grayer, MD  01/13/2015 2:34 PM     [facility] [address] [place] [address] [phone] (office) [phone] (fax)

## 2015-02-03 ENCOUNTER — Other Ambulatory Visit (INDEPENDENT_AMBULATORY_CARE_PROVIDER_SITE_OTHER): Payer: 59

## 2015-02-03 ENCOUNTER — Ambulatory Visit (INDEPENDENT_AMBULATORY_CARE_PROVIDER_SITE_OTHER): Payer: 59 | Admitting: Internal Medicine

## 2015-02-03 ENCOUNTER — Encounter: Payer: Self-pay | Admitting: Internal Medicine

## 2015-02-03 VITALS — BP 140/82 | HR 79 | Temp 97.8°F | Resp 16 | Ht 69.0 in | Wt 247.5 lb

## 2015-02-03 DIAGNOSIS — Z23 Encounter for immunization: Secondary | ICD-10-CM

## 2015-02-03 DIAGNOSIS — Z0000 Encounter for general adult medical examination without abnormal findings: Secondary | ICD-10-CM | POA: Diagnosis not present

## 2015-02-03 DIAGNOSIS — I480 Paroxysmal atrial fibrillation: Secondary | ICD-10-CM

## 2015-02-03 DIAGNOSIS — I10 Essential (primary) hypertension: Secondary | ICD-10-CM

## 2015-02-03 LAB — URINALYSIS, ROUTINE W REFLEX MICROSCOPIC
Bilirubin Urine: NEGATIVE
Ketones, ur: NEGATIVE
Nitrite: NEGATIVE
Specific Gravity, Urine: 1.02 (ref 1.000–1.030)
Total Protein, Urine: NEGATIVE
Urine Glucose: NEGATIVE
Urobilinogen, UA: 0.2 (ref 0.0–1.0)
pH: 5.5 (ref 5.0–8.0)

## 2015-02-03 LAB — COMPREHENSIVE METABOLIC PANEL
ALT: 12 U/L (ref 0–35)
AST: 18 U/L (ref 0–37)
Albumin: 4.4 g/dL (ref 3.5–5.2)
Alkaline Phosphatase: 92 U/L (ref 39–117)
BUN: 20 mg/dL (ref 6–23)
CO2: 29 mEq/L (ref 19–32)
Calcium: 9.9 mg/dL (ref 8.4–10.5)
Chloride: 101 mEq/L (ref 96–112)
Creatinine, Ser: 0.94 mg/dL (ref 0.40–1.20)
GFR: 78.44 mL/min (ref >60.00)
Glucose, Bld: 100 mg/dL — ABNORMAL HIGH (ref 70–99)
Potassium: 4.8 mEq/L (ref 3.5–5.1)
Sodium: 135 mEq/L (ref 135–145)
Total Bilirubin: 0.3 mg/dL (ref 0.2–1.2)
Total Protein: 8.1 g/dL (ref 6.0–8.3)

## 2015-02-03 LAB — LIPID PANEL
Cholesterol: 184 mg/dL (ref 0–200)
HDL: 45.5 mg/dL (ref >39.00)
LDL Cholesterol: 121 mg/dL — ABNORMAL HIGH (ref 0–99)
NonHDL: 138.5
Total CHOL/HDL Ratio: 4
Triglycerides: 87 mg/dL (ref 0.0–149.0)
VLDL: 17.4 mg/dL (ref 0.0–40.0)

## 2015-02-03 LAB — CBC WITH DIFFERENTIAL/PLATELET
Basophils Absolute: 0 10*3/uL (ref 0.0–0.1)
Basophils Relative: 0.4 % (ref 0.0–3.0)
Eosinophils Absolute: 0.2 10*3/uL (ref 0.0–0.7)
Eosinophils Relative: 2.6 % (ref 0.0–5.0)
HCT: 40.9 % (ref 36.0–46.0)
Hemoglobin: 13.4 g/dL (ref 12.0–15.0)
Lymphocytes Relative: 15.9 % (ref 12.0–46.0)
Lymphs Abs: 1.3 10*3/uL (ref 0.7–4.0)
MCHC: 32.8 g/dL (ref 30.0–36.0)
MCV: 80.9 fl (ref 78.0–100.0)
Monocytes Absolute: 0.6 10*3/uL (ref 0.1–1.0)
Monocytes Relative: 7.8 % (ref 3.0–12.0)
Neutro Abs: 5.9 10*3/uL (ref 1.4–7.7)
Neutrophils Relative %: 73.3 % (ref 43.0–77.0)
Platelets: 260 10*3/uL (ref 150.0–400.0)
RBC: 5.06 Mil/uL (ref 3.87–5.11)
RDW: 14.3 % (ref 11.5–15.5)
WBC: 8.1 10*3/uL (ref 4.0–10.5)

## 2015-02-03 LAB — TSH: TSH: 2.58 u[IU]/mL (ref 0.35–4.50)

## 2015-02-03 NOTE — Progress Notes (Signed)
Pre visit review using our clinic review tool, if applicable. No additional management support is needed unless otherwise documented below in the visit note. 

## 2015-02-03 NOTE — Patient Instructions (Signed)
Preventive Care for Adults A healthy lifestyle and preventive care can promote health and wellness. Preventive health guidelines for women include the following key practices.  A routine yearly physical is a good way to check with your health care provider about your health and preventive screening. It is a chance to share any concerns and updates on your health and to receive a thorough exam.  Visit your dentist for a routine exam and preventive care every 6 months. Brush your teeth twice a day and floss once a day. Good oral hygiene prevents tooth decay and gum disease.  The frequency of eye exams is based on your age, health, family medical history, use of contact lenses, and other factors. Follow your health care provider's recommendations for frequency of eye exams.  Eat a healthy diet. Foods like vegetables, fruits, whole grains, low-fat dairy products, and lean protein foods contain the nutrients you need without too many calories. Decrease your intake of foods high in solid fats, added sugars, and salt. Eat the right amount of calories for you.Get information about a proper diet from your health care provider, if necessary.  Regular physical exercise is one of the most important things you can do for your health. Most adults should get at least 150 minutes of moderate-intensity exercise (any activity that increases your heart rate and causes you to sweat) each week. In addition, most adults need muscle-strengthening exercises on 2 or more days a week.  Maintain a healthy weight. The body mass index (BMI) is a screening tool to identify possible weight problems. It provides an estimate of body fat based on height and weight. Your health care provider can find your BMI and can help you achieve or maintain a healthy weight.For adults 20 years and older:  A BMI below 18.5 is considered underweight.  A BMI of 18.5 to 24.9 is normal.  A BMI of 25 to 29.9 is considered overweight.  A BMI of  30 and above is considered obese.  Maintain normal blood lipids and cholesterol levels by exercising and minimizing your intake of saturated fat. Eat a balanced diet with plenty of fruit and vegetables. Blood tests for lipids and cholesterol should begin at age 76 and be repeated every 5 years. If your lipid or cholesterol levels are high, you are over 50, or you are at high risk for heart disease, you may need your cholesterol levels checked more frequently.Ongoing high lipid and cholesterol levels should be treated with medicines if diet and exercise are not working.  If you smoke, find out from your health care provider how to quit. If you do not use tobacco, do not start.  Lung cancer screening is recommended for adults aged 22-80 years who are at high risk for developing lung cancer because of a history of smoking. A yearly low-dose CT scan of the lungs is recommended for people who have at least a 30-pack-year history of smoking and are a current smoker or have quit within the past 15 years. A pack year of smoking is smoking an average of 1 pack of cigarettes a day for 1 year (for example: 1 pack a day for 30 years or 2 packs a day for 15 years). Yearly screening should continue until the smoker has stopped smoking for at least 15 years. Yearly screening should be stopped for people who develop a health problem that would prevent them from having lung cancer treatment.  If you are pregnant, do not drink alcohol. If you are breastfeeding,  be very cautious about drinking alcohol. If you are not pregnant and choose to drink alcohol, do not have more than 1 drink per day. One drink is considered to be 12 ounces (355 mL) of beer, 5 ounces (148 mL) of wine, or 1.5 ounces (44 mL) of liquor.  Avoid use of street drugs. Do not share needles with anyone. Ask for help if you need support or instructions about stopping the use of drugs.  High blood pressure causes heart disease and increases the risk of  stroke. Your blood pressure should be checked at least every 1 to 2 years. Ongoing high blood pressure should be treated with medicines if weight loss and exercise do not work.  If you are 75-52 years old, ask your health care provider if you should take aspirin to prevent strokes.  Diabetes screening involves taking a blood sample to check your fasting blood sugar level. This should be done once every 3 years, after age 15, if you are within normal weight and without risk factors for diabetes. Testing should be considered at a younger age or be carried out more frequently if you are overweight and have at least 1 risk factor for diabetes.  Breast cancer screening is essential preventive care for women. You should practice "breast self-awareness." This means understanding the normal appearance and feel of your breasts and may include breast self-examination. Any changes detected, no matter how small, should be reported to a health care provider. Women in their 58s and 30s should have a clinical breast exam (CBE) by a health care provider as part of a regular health exam every 1 to 3 years. After age 16, women should have a CBE every year. Starting at age 53, women should consider having a mammogram (breast X-ray test) every year. Women who have a family history of breast cancer should talk to their health care provider about genetic screening. Women at a high risk of breast cancer should talk to their health care providers about having an MRI and a mammogram every year.  Breast cancer gene (BRCA)-related cancer risk assessment is recommended for women who have family members with BRCA-related cancers. BRCA-related cancers include breast, ovarian, tubal, and peritoneal cancers. Having family members with these cancers may be associated with an increased risk for harmful changes (mutations) in the breast cancer genes BRCA1 and BRCA2. Results of the assessment will determine the need for genetic counseling and  BRCA1 and BRCA2 testing.  Routine pelvic exams to screen for cancer are no longer recommended for nonpregnant women who are considered low risk for cancer of the pelvic organs (ovaries, uterus, and vagina) and who do not have symptoms. Ask your health care provider if a screening pelvic exam is right for you.  If you have had past treatment for cervical cancer or a condition that could lead to cancer, you need Pap tests and screening for cancer for at least 20 years after your treatment. If Pap tests have been discontinued, your risk factors (such as having a new sexual partner) need to be reassessed to determine if screening should be resumed. Some women have medical problems that increase the chance of getting cervical cancer. In these cases, your health care provider may recommend more frequent screening and Pap tests.  The HPV test is an additional test that may be used for cervical cancer screening. The HPV test looks for the virus that can cause the cell changes on the cervix. The cells collected during the Pap test can be  tested for HPV. The HPV test could be used to screen women aged 30 years and older, and should be used in women of any age who have unclear Pap test results. After the age of 30, women should have HPV testing at the same frequency as a Pap test.  Colorectal cancer can be detected and often prevented. Most routine colorectal cancer screening begins at the age of 50 years and continues through age 75 years. However, your health care provider may recommend screening at an earlier age if you have risk factors for colon cancer. On a yearly basis, your health care provider may provide home test kits to check for hidden blood in the stool. Use of a small camera at the end of a tube, to directly examine the colon (sigmoidoscopy or colonoscopy), can detect the earliest forms of colorectal cancer. Talk to your health care provider about this at age 50, when routine screening begins. Direct  exam of the colon should be repeated every 5-10 years through age 75 years, unless early forms of pre-cancerous polyps or small growths are found.  People who are at an increased risk for hepatitis B should be screened for this virus. You are considered at high risk for hepatitis B if:  You were born in a country where hepatitis B occurs often. Talk with your health care provider about which countries are considered high risk.  Your parents were born in a high-risk country and you have not received a shot to protect against hepatitis B (hepatitis B vaccine).  You have HIV or AIDS.  You use needles to inject street drugs.  You live with, or have sex with, someone who has hepatitis B.  You get hemodialysis treatment.  You take certain medicines for conditions like cancer, organ transplantation, and autoimmune conditions.  Hepatitis C blood testing is recommended for all people born from 1945 through 1965 and any individual with known risks for hepatitis C.  Practice safe sex. Use condoms and avoid high-risk sexual practices to reduce the spread of sexually transmitted infections (STIs). STIs include gonorrhea, chlamydia, syphilis, trichomonas, herpes, HPV, and human immunodeficiency virus (HIV). Herpes, HIV, and HPV are viral illnesses that have no cure. They can result in disability, cancer, and death.  You should be screened for sexually transmitted illnesses (STIs) including gonorrhea and chlamydia if:  You are sexually active and are younger than 24 years.  You are older than 24 years and your health care provider tells you that you are at risk for this type of infection.  Your sexual activity has changed since you were last screened and you are at an increased risk for chlamydia or gonorrhea. Ask your health care provider if you are at risk.  If you are at risk of being infected with HIV, it is recommended that you take a prescription medicine daily to prevent HIV infection. This is  called preexposure prophylaxis (PrEP). You are considered at risk if:  You are a heterosexual woman, are sexually active, and are at increased risk for HIV infection.  You take drugs by injection.  You are sexually active with a partner who has HIV.  Talk with your health care provider about whether you are at high risk of being infected with HIV. If you choose to begin PrEP, you should first be tested for HIV. You should then be tested every 3 months for as long as you are taking PrEP.  Osteoporosis is a disease in which the bones lose minerals and strength   with aging. This can result in serious bone fractures or breaks. The risk of osteoporosis can be identified using a bone density scan. Women ages 65 years and over and women at risk for fractures or osteoporosis should discuss screening with their health care providers. Ask your health care provider whether you should take a calcium supplement or vitamin D to reduce the rate of osteoporosis.  Menopause can be associated with physical symptoms and risks. Hormone replacement therapy is available to decrease symptoms and risks. You should talk to your health care provider about whether hormone replacement therapy is right for you.  Use sunscreen. Apply sunscreen liberally and repeatedly throughout the day. You should seek shade when your shadow is shorter than you. Protect yourself by wearing long sleeves, pants, a wide-brimmed hat, and sunglasses year round, whenever you are outdoors.  Once a month, do a whole body skin exam, using a mirror to look at the skin on your back. Tell your health care provider of new moles, moles that have irregular borders, moles that are larger than a pencil eraser, or moles that have changed in shape or color.  Stay current with required vaccines (immunizations).  Influenza vaccine. All adults should be immunized every year.  Tetanus, diphtheria, and acellular pertussis (Td, Tdap) vaccine. Pregnant women should  receive 1 dose of Tdap vaccine during each pregnancy. The dose should be obtained regardless of the length of time since the last dose. Immunization is preferred during the 27th-36th week of gestation. An adult who has not previously received Tdap or who does not know her vaccine status should receive 1 dose of Tdap. This initial dose should be followed by tetanus and diphtheria toxoids (Td) booster doses every 10 years. Adults with an unknown or incomplete history of completing a 3-dose immunization series with Td-containing vaccines should begin or complete a primary immunization series including a Tdap dose. Adults should receive a Td booster every 10 years.  Varicella vaccine. An adult without evidence of immunity to varicella should receive 2 doses or a second dose if she has previously received 1 dose. Pregnant females who do not have evidence of immunity should receive the first dose after pregnancy. This first dose should be obtained before leaving the health care facility. The second dose should be obtained 4-8 weeks after the first dose.  Human papillomavirus (HPV) vaccine. Females aged 13-26 years who have not received the vaccine previously should obtain the 3-dose series. The vaccine is not recommended for use in pregnant females. However, pregnancy testing is not needed before receiving a dose. If a female is found to be pregnant after receiving a dose, no treatment is needed. In that case, the remaining doses should be delayed until after the pregnancy. Immunization is recommended for any person with an immunocompromised condition through the age of 26 years if she did not get any or all doses earlier. During the 3-dose series, the second dose should be obtained 4-8 weeks after the first dose. The third dose should be obtained 24 weeks after the first dose and 16 weeks after the second dose.  Zoster vaccine. One dose is recommended for adults aged 60 years or older unless certain conditions are  present.  Measles, mumps, and rubella (MMR) vaccine. Adults born before 1957 generally are considered immune to measles and mumps. Adults born in 1957 or later should have 1 or more doses of MMR vaccine unless there is a contraindication to the vaccine or there is laboratory evidence of immunity to   each of the three diseases. A routine second dose of MMR vaccine should be obtained at least 28 days after the first dose for students attending postsecondary schools, health care workers, or international travelers. People who received inactivated measles vaccine or an unknown type of measles vaccine during 1963-1967 should receive 2 doses of MMR vaccine. People who received inactivated mumps vaccine or an unknown type of mumps vaccine before 1979 and are at high risk for mumps infection should consider immunization with 2 doses of MMR vaccine. For females of childbearing age, rubella immunity should be determined. If there is no evidence of immunity, females who are not pregnant should be vaccinated. If there is no evidence of immunity, females who are pregnant should delay immunization until after pregnancy. Unvaccinated health care workers born before 1957 who lack laboratory evidence of measles, mumps, or rubella immunity or laboratory confirmation of disease should consider measles and mumps immunization with 2 doses of MMR vaccine or rubella immunization with 1 dose of MMR vaccine.  Pneumococcal 13-valent conjugate (PCV13) vaccine. When indicated, a person who is uncertain of her immunization history and has no record of immunization should receive the PCV13 vaccine. An adult aged 19 years or older who has certain medical conditions and has not been previously immunized should receive 1 dose of PCV13 vaccine. This PCV13 should be followed with a dose of pneumococcal polysaccharide (PPSV23) vaccine. The PPSV23 vaccine dose should be obtained at least 8 weeks after the dose of PCV13 vaccine. An adult aged 19  years or older who has certain medical conditions and previously received 1 or more doses of PPSV23 vaccine should receive 1 dose of PCV13. The PCV13 vaccine dose should be obtained 1 or more years after the last PPSV23 vaccine dose.  Pneumococcal polysaccharide (PPSV23) vaccine. When PCV13 is also indicated, PCV13 should be obtained first. All adults aged 65 years and older should be immunized. An adult younger than age 65 years who has certain medical conditions should be immunized. Any person who resides in a nursing home or long-term care facility should be immunized. An adult smoker should be immunized. People with an immunocompromised condition and certain other conditions should receive both PCV13 and PPSV23 vaccines. People with human immunodeficiency virus (HIV) infection should be immunized as soon as possible after diagnosis. Immunization during chemotherapy or radiation therapy should be avoided. Routine use of PPSV23 vaccine is not recommended for American Indians, [place] Natives, or people younger than 65 years unless there are medical conditions that require PPSV23 vaccine. When indicated, people who have unknown immunization and have no record of immunization should receive PPSV23 vaccine. One-time revaccination 5 years after the first dose of PPSV23 is recommended for people aged 19-64 years who have chronic kidney failure, nephrotic syndrome, asplenia, or immunocompromised conditions. People who received 1-2 doses of PPSV23 before age 65 years should receive another dose of PPSV23 vaccine at age 65 years or later if at least 5 years have passed since the previous dose. Doses of PPSV23 are not needed for people immunized with PPSV23 at or after age 65 years.  Meningococcal vaccine. Adults with asplenia or persistent complement component deficiencies should receive 2 doses of quadrivalent meningococcal conjugate (MenACWY-D) vaccine. The doses should be obtained at least 2 months apart.  Microbiologists working with certain meningococcal bacteria, military recruits, people at risk during an outbreak, and people who travel to or live in countries with a high rate of meningitis should be immunized. A first-year college student up through age   21 years who is living in a residence hall should receive a dose if she did not receive a dose on or after her 16th birthday. Adults who have certain high-risk conditions should receive one or more doses of vaccine.  Hepatitis A vaccine. Adults who wish to be protected from this disease, have certain high-risk conditions, work with hepatitis A-infected animals, work in hepatitis A research labs, or travel to or work in countries with a high rate of hepatitis A should be immunized. Adults who were previously unvaccinated and who anticipate close contact with an international adoptee during the first 60 days after arrival in the [place] from a country with a high rate of hepatitis A should be immunized.  Hepatitis B vaccine. Adults who wish to be protected from this disease, have certain high-risk conditions, may be exposed to blood or other infectious body fluids, are household contacts or sex partners of hepatitis B positive people, are clients or workers in certain care facilities, or travel to or work in countries with a high rate of hepatitis B should be immunized.  Haemophilus influenzae type b (Hib) vaccine. A previously unvaccinated person with asplenia or sickle cell disease or having a scheduled splenectomy should receive 1 dose of Hib vaccine. Regardless of previous immunization, a recipient of a hematopoietic stem cell transplant should receive a 3-dose series 6-12 months after her successful transplant. Hib vaccine is not recommended for adults with HIV infection. Preventive Services / Frequency Ages 64 to 68 years  Blood pressure check.** / Every 1 to 2 years.  Lipid and cholesterol check.** / Every 5 years beginning at age  22.  Clinical breast exam.** / Every 3 years for women in their 88s and 53s.  BRCA-related cancer risk assessment.** / For women who have family members with a BRCA-related cancer (breast, ovarian, tubal, or peritoneal cancers).  Pap test.** / Every 2 years from ages 90 through 51. Every 3 years starting at age 21 through age 56 or 3 with a history of 3 consecutive normal Pap tests.  HPV screening.** / Every 3 years from ages 24 through ages 1 to 46 with a history of 3 consecutive normal Pap tests.  Hepatitis C blood test.** / For any individual with known risks for hepatitis C.  Skin self-exam. / Monthly.  Influenza vaccine. / Every year.  Tetanus, diphtheria, and acellular pertussis (Tdap, Td) vaccine.** / Consult your health care provider. Pregnant women should receive 1 dose of Tdap vaccine during each pregnancy. 1 dose of Td every 10 years.  Varicella vaccine.** / Consult your health care provider. Pregnant females who do not have evidence of immunity should receive the first dose after pregnancy.  HPV vaccine. / 3 doses over 6 months, if 72 and younger. The vaccine is not recommended for use in pregnant females. However, pregnancy testing is not needed before receiving a dose.  Measles, mumps, rubella (MMR) vaccine.** / You need at least 1 dose of MMR if you were born in 1957 or later. You may also need a 2nd dose. For females of childbearing age, rubella immunity should be determined. If there is no evidence of immunity, females who are not pregnant should be vaccinated. If there is no evidence of immunity, females who are pregnant should delay immunization until after pregnancy.  Pneumococcal 13-valent conjugate (PCV13) vaccine.** / Consult your health care provider.  Pneumococcal polysaccharide (PPSV23) vaccine.** / 1 to 2 doses if you smoke cigarettes or if you have certain conditions.  Meningococcal vaccine.** /  1 dose if you are age 19 to 21 years and a first-year college  student living in a residence hall, or have one of several medical conditions, you need to get vaccinated against meningococcal disease. You may also need additional booster doses.  Hepatitis A vaccine.** / Consult your health care provider.  Hepatitis B vaccine.** / Consult your health care provider.  Haemophilus influenzae type b (Hib) vaccine.** / Consult your health care provider. Ages 40 to 64 years  Blood pressure check.** / Every 1 to 2 years.  Lipid and cholesterol check.** / Every 5 years beginning at age 20 years.  Lung cancer screening. / Every year if you are aged 55-80 years and have a 30-pack-year history of smoking and currently smoke or have quit within the past 15 years. Yearly screening is stopped once you have quit smoking for at least 15 years or develop a health problem that would prevent you from having lung cancer treatment.  Clinical breast exam.** / Every year after age 40 years.  BRCA-related cancer risk assessment.** / For women who have family members with a BRCA-related cancer (breast, ovarian, tubal, or peritoneal cancers).  Mammogram.** / Every year beginning at age 40 years and continuing for as long as you are in good health. Consult with your health care provider.  Pap test.** / Every 3 years starting at age 30 years through age 65 or 70 years with a history of 3 consecutive normal Pap tests.  HPV screening.** / Every 3 years from ages 30 years through ages 65 to 70 years with a history of 3 consecutive normal Pap tests.  Fecal occult blood test (FOBT) of stool. / Every year beginning at age 50 years and continuing until age 75 years. You may not need to do this test if you get a colonoscopy every 10 years.  Flexible sigmoidoscopy or colonoscopy.** / Every 5 years for a flexible sigmoidoscopy or every 10 years for a colonoscopy beginning at age 50 years and continuing until age 75 years.  Hepatitis C blood test.** / For all people born from 1945 through  1965 and any individual with known risks for hepatitis C.  Skin self-exam. / Monthly.  Influenza vaccine. / Every year.  Tetanus, diphtheria, and acellular pertussis (Tdap/Td) vaccine.** / Consult your health care provider. Pregnant women should receive 1 dose of Tdap vaccine during each pregnancy. 1 dose of Td every 10 years.  Varicella vaccine.** / Consult your health care provider. Pregnant females who do not have evidence of immunity should receive the first dose after pregnancy.  Zoster vaccine.** / 1 dose for adults aged 60 years or older.  Measles, mumps, rubella (MMR) vaccine.** / You need at least 1 dose of MMR if you were born in 1957 or later. You may also need a 2nd dose. For females of childbearing age, rubella immunity should be determined. If there is no evidence of immunity, females who are not pregnant should be vaccinated. If there is no evidence of immunity, females who are pregnant should delay immunization until after pregnancy.  Pneumococcal 13-valent conjugate (PCV13) vaccine.** / Consult your health care provider.  Pneumococcal polysaccharide (PPSV23) vaccine.** / 1 to 2 doses if you smoke cigarettes or if you have certain conditions.  Meningococcal vaccine.** / Consult your health care provider.  Hepatitis A vaccine.** / Consult your health care provider.  Hepatitis B vaccine.** / Consult your health care provider.  Haemophilus influenzae type b (Hib) vaccine.** / Consult your health care provider. Ages 65   years and over  Blood pressure check.** / Every 1 to 2 years.  Lipid and cholesterol check.** / Every 5 years beginning at age 22 years.  Lung cancer screening. / Every year if you are aged 73-80 years and have a 30-pack-year history of smoking and currently smoke or have quit within the past 15 years. Yearly screening is stopped once you have quit smoking for at least 15 years or develop a health problem that would prevent you from having lung cancer  treatment.  Clinical breast exam.** / Every year after age 4 years.  BRCA-related cancer risk assessment.** / For women who have family members with a BRCA-related cancer (breast, ovarian, tubal, or peritoneal cancers).  Mammogram.** / Every year beginning at age 40 years and continuing for as long as you are in good health. Consult with your health care provider.  Pap test.** / Every 3 years starting at age 9 years through age 34 or 91 years with 3 consecutive normal Pap tests. Testing can be stopped between 65 and 70 years with 3 consecutive normal Pap tests and no abnormal Pap or HPV tests in the past 10 years.  HPV screening.** / Every 3 years from ages 57 years through ages 64 or 45 years with a history of 3 consecutive normal Pap tests. Testing can be stopped between 65 and 70 years with 3 consecutive normal Pap tests and no abnormal Pap or HPV tests in the past 10 years.  Fecal occult blood test (FOBT) of stool. / Every year beginning at age 15 years and continuing until age 17 years. You may not need to do this test if you get a colonoscopy every 10 years.  Flexible sigmoidoscopy or colonoscopy.** / Every 5 years for a flexible sigmoidoscopy or every 10 years for a colonoscopy beginning at age 86 years and continuing until age 71 years.  Hepatitis C blood test.** / For all people born from [birth year] through 1965 and any individual with known risks for hepatitis C.  Osteoporosis screening.** / A one-time screening for women ages 83 years and over and women at risk for fractures or osteoporosis.  Skin self-exam. / Monthly.  Influenza vaccine. / Every year.  Tetanus, diphtheria, and acellular pertussis (Tdap/Td) vaccine.** / 1 dose of Td every 10 years.  Varicella vaccine.** / Consult your health care provider.  Zoster vaccine.** / 1 dose for adults aged 61 years or older.  Pneumococcal 13-valent conjugate (PCV13) vaccine.** / Consult your health care provider.  Pneumococcal  polysaccharide (PPSV23) vaccine.** / 1 dose for all adults aged 28 years and older.  Meningococcal vaccine.** / Consult your health care provider.  Hepatitis A vaccine.** / Consult your health care provider.  Hepatitis B vaccine.** / Consult your health care provider.  Haemophilus influenzae type b (Hib) vaccine.** / Consult your health care provider. ** Family history and personal history of risk and conditions may change your health care provider's recommendations. Document Released: 10/12/2001 Document Revised: 12/31/2013 Document Reviewed: 01/11/2011 [facility] Patient Information 2015 [place], [place]. This information is not intended to replace advice given to you by your health care provider. Make sure you discuss any questions you have with your health care provider.

## 2015-02-03 NOTE — Addendum Note (Signed)
Addended by: Estell Harpin T on: 02/03/2015 10:27 AM   Modules accepted: Orders

## 2015-02-03 NOTE — Progress Notes (Signed)
Subjective:  Patient ID: Pamela Davenport, female    DOB: [birth year]  Age: 59 y.o. MRN: [id number]  CC: Hypertension and Annual Exam   HPI Pamela Davenport presents for a CPX - she is new to me, she feels well and offers no complaints.  History Pamela Davenport has a past medical history of Symptomatic premature ventricular contractions; Paroxysmal atrial fibrillation; PE (pulmonary embolism); DVT (deep venous thrombosis); Strep throat (at age 55); Uterine bleeding (2008); Atypical chest pain; PONV (postoperative nausea and vomiting); Herniated lumbar intervertebral disc; Family history of anesthesia complication; Pneumonia (07/20/12); Urgency of urination; and Sickle cell trait.   She has past surgical history that includes Uterine artery embolization (2008); PVC Ablation (2010); Diagnostic D&C hysteroscopy and Novasure ablation (03/17/2004); Vascular surgery; Dilation and curettage of uterus (12/17/2011); Tonsillectomy; Colonoscopy w/ polypectomy; Mandible surgery; and Lumbar laminectomy/decompression microdiscectomy (Left, 02/21/2013).   Her family history includes Diabetes in her mother; Hypertension in her mother.She reports that she has quit smoking. She does not have any smokeless tobacco history on file. She reports that she drinks alcohol. She reports that she does not use illicit drugs.  Outpatient Prescriptions Prior to Visit  Medication Sig Dispense Refill  . flecainide (TAMBOCOR) 150 MG tablet Take 2 at onset of afib 10 tablet 3  . nadolol (CORGARD) 20 MG tablet Take 1 tablet (20 mg total) by mouth daily. 90 tablet 3  . rivaroxaban (XARELTO) 20 MG TABS tablet Take 1 tablet (20 mg total) by mouth daily with supper. 30 tablet 11  . VOLTAREN 1 % GEL Apply 2 g topically 4 (four) times daily as needed (pain).   2  . ibuprofen (ADVIL,MOTRIN) 200 MG tablet Take 600 mg by mouth every 6 (six) hours as needed for pain.    . cyclobenzaprine (FLEXERIL) 10 MG tablet Take 1 tablet (10 mg total) by mouth 3  (three) times daily as needed for muscle spasms. (Patient not taking: Reported on 02/03/2015) 45 tablet 0  . hydrochlorothiazide (MICROZIDE) 12.5 MG capsule Take 1 capsule (12.5 mg total) by mouth daily. (Patient not taking: Reported on 02/03/2015) 90 capsule 3  . triamcinolone acetonide (KENALOG) 10 MG/ML injection 10 mg      No facility-administered medications prior to visit.    ROS Review of Systems  Constitutional: Negative.  Negative for fever, chills, diaphoresis, appetite change and fatigue.  HENT: Negative.   Eyes: Negative.   Respiratory: Negative.  Negative for cough, choking, chest tightness, shortness of breath and stridor.   Cardiovascular: Negative.  Negative for chest pain, palpitations and leg swelling.  Gastrointestinal: Negative.  Negative for nausea, vomiting, abdominal pain, diarrhea, constipation and blood in stool.  Endocrine: Negative.   Genitourinary: Negative.   Musculoskeletal: Negative.  Negative for myalgias and back pain.  Skin: Negative.   Allergic/Immunologic: Negative.   Neurological: Negative.  Negative for dizziness, syncope, speech difficulty, weakness and light-headedness.  Hematological: Negative.  Negative for adenopathy. Does not bruise/bleed easily.  Psychiatric/Behavioral: Negative.     Objective:  BP 140/82 mmHg  Pulse 79  Temp(Src) 97.8 F (36.6 C) (Oral)  Resp 16  Ht 5\' 9"  (1.753 m)  Wt 247 lb 8 oz (112.265 kg)  BMI 36.53 kg/m2  SpO2 96%  Physical Exam  Constitutional: She is oriented to person, place, and time. She appears well-developed and well-nourished. No distress.  HENT:  Head: Normocephalic and atraumatic.  Mouth/Throat: Oropharynx is clear and moist. No oropharyngeal exudate.  Eyes: Conjunctivae are normal. Right eye exhibits no discharge. Left  eye exhibits no discharge. No scleral icterus.  Neck: Normal range of motion. Neck supple. No JVD present. No tracheal deviation present. No thyromegaly present.  Cardiovascular:  Normal rate, regular rhythm, normal heart sounds and intact distal pulses.  Exam reveals no gallop and no friction rub.   No murmur heard. Pulmonary/Chest: Effort normal and breath sounds normal. No stridor. No respiratory distress. She has no wheezes. She has no rales. She exhibits no tenderness.  Abdominal: Soft. Bowel sounds are normal. She exhibits no distension and no mass. There is no tenderness. There is no rebound and no guarding.  Musculoskeletal: Normal range of motion. She exhibits no edema or tenderness.  Lymphadenopathy:    She has no cervical adenopathy.  Neurological: She is oriented to person, place, and time.  Skin: Skin is warm and dry. No rash noted. She is not diaphoretic. No erythema. No pallor.  Psychiatric: She has a normal mood and affect. Judgment and thought content normal.  Vitals reviewed.     Assessment & Plan:   Reise was seen today for hypertension and annual exam.  Diagnoses and all orders for this visit:  Paroxysmal atrial fibrillation  Essential hypertension  Routine general medical examination at a health care facility - her vaccines were reviewed and tdap was given, colonoscopy, PAP and mammo are UTD, exam done, labs ordered Orders: -     Lipid panel; Future -     Comprehensive metabolic panel; Future -     CBC with Differential/Platelet; Future -     TSH; Future -     Urinalysis, Routine w reflex microscopic (not at [facility]); Future   I have discontinued [name]'s ibuprofen, cyclobenzaprine, and hydrochlorothiazide. I am also having her maintain her rivaroxaban, VOLTAREN, nadolol, and flecainide. We will stop administering triamcinolone acetonide.  No orders of the defined types were placed in this encounter.     Follow-up: Return in about 6 months (around 08/05/2015).  Scarlette Calico, MD

## 2015-02-04 ENCOUNTER — Telehealth: Payer: Self-pay | Admitting: Internal Medicine

## 2015-02-04 NOTE — Telephone Encounter (Signed)
Pt calling re back trouble-can't walk for stress test Friday-can rs or can change to med stress versus treadmill-pls advise

## 2015-02-05 ENCOUNTER — Telehealth (HOSPITAL_COMMUNITY): Payer: Self-pay | Admitting: *Deleted

## 2015-02-05 NOTE — Telephone Encounter (Signed)
Discussed with Dr Rayann Heman and it is okay to change to [facility] as she is having back problems.  Melissa will change appointment to that and let her know

## 2015-02-05 NOTE — Telephone Encounter (Signed)
Left message on voicemail in reference to upcoming appointment scheduled on 02/07/15 at 9:15 with detailed instructions given per Myocardial Perfusion Study Information Sheet for the test. Phone number given for call back for any questions. Hubbard Robinson, RN

## 2015-02-07 ENCOUNTER — Ambulatory Visit (HOSPITAL_COMMUNITY): Payer: 59 | Attending: Internal Medicine

## 2015-02-07 DIAGNOSIS — R072 Precordial pain: Secondary | ICD-10-CM | POA: Insufficient documentation

## 2015-02-07 DIAGNOSIS — I10 Essential (primary) hypertension: Secondary | ICD-10-CM | POA: Diagnosis not present

## 2015-02-07 DIAGNOSIS — I4891 Unspecified atrial fibrillation: Secondary | ICD-10-CM | POA: Insufficient documentation

## 2015-02-07 LAB — MYOCARDIAL PERFUSION IMAGING
LV dias vol: 106 mL
LV sys vol: 48 mL
Nuc Stress EF: 55 %
Peak HR: 118 {beats}/min
RATE: 0.29
Rest HR: 82 {beats}/min
SDS: 1
SRS: 0
SSS: 1
TID: 1.06

## 2015-02-07 MED ORDER — TECHNETIUM TC 99M SESTAMIBI GENERIC - CARDIOLITE
33.0000 | Freq: Once | INTRAVENOUS | Status: AC | PRN
  Administered 2015-02-07: 33 via INTRAVENOUS

## 2015-02-07 MED ORDER — TECHNETIUM TC 99M SESTAMIBI GENERIC - CARDIOLITE
11.0000 | Freq: Once | INTRAVENOUS | Status: AC | PRN
  Administered 2015-02-07: 11 via INTRAVENOUS

## 2015-02-07 MED ORDER — REGADENOSON 0.4 MG/5ML IV SOLN
0.4000 mg | Freq: Once | INTRAVENOUS | Status: AC
  Administered 2015-02-07: 0.4 mg via INTRAVENOUS

## 2015-04-23 ENCOUNTER — Other Ambulatory Visit: Payer: Self-pay | Admitting: Internal Medicine

## 2015-04-23 ENCOUNTER — Other Ambulatory Visit: Payer: Self-pay | Admitting: *Deleted

## 2015-04-23 MED ORDER — RIVAROXABAN 20 MG PO TABS: 20.0000 mg | ORAL_TABLET | Freq: Every day | ORAL | Status: DC

## 2015-04-28 ENCOUNTER — Ambulatory Visit (INDEPENDENT_AMBULATORY_CARE_PROVIDER_SITE_OTHER): Payer: 59 | Admitting: *Deleted

## 2015-04-28 DIAGNOSIS — I4891 Unspecified atrial fibrillation: Secondary | ICD-10-CM

## 2015-04-28 LAB — CBC
HCT: 38.5 % (ref 36.0–46.0)
Hemoglobin: 12.5 g/dL (ref 12.0–15.0)
MCHC: 32.6 g/dL (ref 30.0–36.0)
MCV: 81.8 fl (ref 78.0–100.0)
Platelets: 245 10*3/uL (ref 150.0–400.0)
RBC: 4.7 Mil/uL (ref 3.87–5.11)
RDW: 14.3 % (ref 11.5–15.5)
WBC: 7.6 10*3/uL (ref 4.0–10.5)

## 2015-04-28 LAB — BASIC METABOLIC PANEL
BUN: 15 mg/dL (ref 6–23)
CO2: 29 mEq/L (ref 19–32)
Calcium: 9.4 mg/dL (ref 8.4–10.5)
Chloride: 103 mEq/L (ref 96–112)
Creatinine, Ser: 0.83 mg/dL (ref 0.40–1.20)
GFR: 90.48 mL/min (ref >60.00)
Glucose, Bld: 93 mg/dL (ref 70–99)
Potassium: 4 mEq/L (ref 3.5–5.1)
Sodium: 138 mEq/L (ref 135–145)

## 2015-04-28 NOTE — Progress Notes (Signed)
Pt was started on Xarelto 20mg  for afib on 04/26/13 by Dr. Rayann Heman.    Reviewed patients medication list.  Pt is not currently on any combined P-gp and strong CYP3A4 inhibitors/inducers (ketoconazole, traconazole, ritonavir, carbamazepine, phenytoin, rifampin, St. John's wort).  Reviewed labs:  SCr- 0.83, Hgb 12.5 and HCT 38.5, Weight 111.3kg, CrCl- 130.53 Dose appropriate based on CrCl.    A full discussion of the nature of anticoagulants has been carried out.  A benefit/risk analysis has been presented to the patient, so that they understand the justification for choosing anticoagulation with Xarelto at this time.  The need for compliance is stressed.  Pt is aware to take the medication once daily with the largest meal of the day.  Side effects of potential bleeding are discussed, including unusual colored urine or stools, coughing up blood or coffee ground emesis, nose bleeds or serious fall or head trauma.  Discussed signs and symptoms of stroke. The patient should avoid any OTC items containing aspirin or ibuprofen.  Avoid alcohol consumption.   Call if any signs of abnormal bleeding.  Discussed financial obligations and resolved any difficulty in obtaining medication.  Next lab test test in 6 months.   Spoke with patient and advised of lab results and to continue Xarelto 20mg  daily; patient verbalized understanding.  Appointment set for 6 months for follow up.

## 2015-06-02 ENCOUNTER — Other Ambulatory Visit: Payer: Self-pay | Admitting: Obstetrics

## 2015-06-02 DIAGNOSIS — Z1231 Encounter for screening mammogram for malignant neoplasm of breast: Secondary | ICD-10-CM

## 2015-06-07 ENCOUNTER — Ambulatory Visit (INDEPENDENT_AMBULATORY_CARE_PROVIDER_SITE_OTHER): Payer: 59 | Admitting: Emergency Medicine

## 2015-06-07 VITALS — BP 122/72 | HR 81 | Temp 97.4°F | Resp 16 | Ht 70.0 in | Wt 244.2 lb

## 2015-06-07 DIAGNOSIS — B029 Zoster without complications: Secondary | ICD-10-CM

## 2015-06-07 MED ORDER — HYDROXYZINE HCL 25 MG PO TABS: 25.0000 mg | ORAL_TABLET | Freq: Three times a day (TID) | ORAL | Status: DC | PRN

## 2015-06-07 MED ORDER — VALACYCLOVIR HCL 1 G PO TABS: 1000.0000 mg | ORAL_TABLET | Freq: Three times a day (TID) | ORAL | Status: DC

## 2015-06-07 MED ORDER — HYDROCODONE-ACETAMINOPHEN 5-325 MG PO TABS: 1.0000 | ORAL_TABLET | ORAL | Status: DC | PRN

## 2015-06-07 NOTE — Progress Notes (Signed)
Subjective:  Pamela ID: Pamela Davenport, female    DOB: 08/10/1956  Age: 59 y.o. MRN: [id number]  CC: Rash   HPI Pamela Davenport presents  she had a history of shingles 3 times in the past and has a painful erythematous vesicular rash on her right lateral lower chest. This been present for 2 days. She has no fever chills nausea vomiting cough coryza or other complaints  History Pamela Davenport has a past medical history of Symptomatic premature ventricular contractions; Paroxysmal atrial fibrillation (HCC); PE (pulmonary embolism); DVT (deep venous thrombosis) ([place]); Strep throat (at age 29); Uterine bleeding (2008); Atypical chest pain; PONV (postoperative nausea and vomiting); Herniated lumbar intervertebral disc; Family history of anesthesia complication; Pneumonia (07/20/12); Urgency of urination; Sickle cell trait ([place]); Blood transfusion without reported diagnosis; Heart murmur; and Hypertension.   She has past surgical history that includes Uterine artery embolization (2008); PVC Ablation (2010); Diagnostic D&C hysteroscopy and Novasure ablation (03/17/2004); Vascular surgery; Dilation and curettage of uterus (12/17/2011); Tonsillectomy; Colonoscopy w/ polypectomy; Mandible surgery; Lumbar laminectomy/decompression microdiscectomy (Left, 02/21/2013); and Spine surgery.   Her  family history includes Diabetes in her mother; Hypertension in her mother.  She   reports that she has quit smoking. She does not have any smokeless tobacco history on file. She reports that she drinks alcohol. She reports that she does not use illicit drugs.  Outpatient Prescriptions Prior to Visit  Medication Sig Dispense Refill  . flecainide (TAMBOCOR) 150 MG tablet Take 2 at onset of afib 10 tablet 3  . nadolol (CORGARD) 20 MG tablet Take 1 tablet (20 mg total) by mouth daily. 90 tablet 3  . rivaroxaban (XARELTO) 20 MG TABS tablet Take 1 tablet (20 mg total) by mouth daily with supper. 30 tablet 1  . VOLTAREN 1 %  GEL Apply 2 g topically 4 (four) times daily as needed (pain).   2   No facility-administered medications prior to visit.    Social History   Social History  . Marital Status: Single    Spouse Name: N/A  . Number of Children: N/A  . Years of Education: N/A   Occupational History  . Nurse    Social History Main Topics  . Smoking status: Former Research scientist (life sciences)  . Smokeless tobacco: None  . Alcohol Use: Yes     Comment: rarely  . Drug Use: No  . Sexual Activity: Not Asked   Other Topics Concern  . None   Social History Narrative   Works as a Marine scientist at [employer]  Constitutional: Negative for fever, chills and appetite change.  HENT: Negative for congestion, ear pain, postnasal drip, sinus pressure and sore throat.   Eyes: Negative for pain and redness.  Respiratory: Negative for cough, shortness of breath and wheezing.   Cardiovascular: Negative for leg swelling.  Gastrointestinal: Negative for nausea, vomiting, abdominal pain, diarrhea, constipation and blood in stool.  Endocrine: Negative for polyuria.  Genitourinary: Negative for dysuria, urgency, frequency and flank pain.  Musculoskeletal: Negative for gait problem.  Skin: Positive for rash.  Neurological: Negative for weakness and headaches.  Psychiatric/Behavioral: Negative for confusion and decreased concentration. The Pamela is not nervous/anxious.     Objective:  BP 122/72 mmHg  Pulse 81  Temp(Src) 97.4 F (36.3 C) (Oral)  Resp 16  Ht 5\' 10"  (1.778 m)  Wt 244 lb 3.2 oz (110.768 kg)  BMI 35.04 kg/m2  SpO2 98%  Physical Exam  Constitutional: She is oriented  to person, place, and time. She appears well-developed and well-nourished. No distress.  HENT:  Head: Normocephalic and atraumatic.  Right Ear: External ear normal.  Left Ear: External ear normal.  Nose: Nose normal.  Eyes: Conjunctivae and EOM are normal. Pupils are equal, round, and reactive to light. No scleral icterus.    Neck: Normal range of motion. Neck supple. No tracheal deviation present.  Cardiovascular: Normal rate, regular rhythm and normal heart sounds.   Pulmonary/Chest: Effort normal. No respiratory distress. She has no wheezes. She has no rales.  Abdominal: She exhibits no mass. There is no tenderness. There is no rebound and no guarding.  Musculoskeletal: She exhibits no edema.  Lymphadenopathy:    She has no cervical adenopathy.  Neurological: She is alert and oriented to person, place, and time. Coordination normal.  Skin: Skin is warm and dry. Rash noted. There is erythema.  Psychiatric: She has a normal mood and affect. Her behavior is normal.   Pamela is a vesicular rash on the right flank is well localized and has the characteristics shingles    Assessment & Plan:   Pamela Davenport was seen today for rash.  Diagnoses and all orders for this visit:  Shingles  Other orders -     hydrOXYzine (ATARAX/VISTARIL) 25 MG tablet; Take 1 tablet (25 mg total) by mouth 3 (three) times daily as needed for itching. -     valACYclovir (VALTREX) 1000 MG tablet; Take 1 tablet (1,000 mg total) by mouth 3 (three) times daily. -     HYDROcodone-acetaminophen (NORCO) 5-325 MG tablet; Take 1-2 tablets by mouth every 4 (four) hours as needed.   I am having Pamela Davenport start on hydrOXYzine, valACYclovir, and HYDROcodone-acetaminophen. I am also having her maintain her VOLTAREN, nadolol, flecainide, and rivaroxaban.  Meds ordered this encounter  Medications  . hydrOXYzine (ATARAX/VISTARIL) 25 MG tablet    Sig: Take 1 tablet (25 mg total) by mouth 3 (three) times daily as needed for itching.    Dispense:  45 tablet    Refill:  0  . valACYclovir (VALTREX) 1000 MG tablet    Sig: Take 1 tablet (1,000 mg total) by mouth 3 (three) times daily.    Dispense:  30 tablet    Refill:  0  . HYDROcodone-acetaminophen (NORCO) 5-325 MG tablet    Sig: Take 1-2 tablets by mouth every 4 (four) hours as needed.    Dispense:   30 tablet    Refill:  0    Appropriate red flag conditions were discussed with the Pamela as well as actions that should be taken.  Pamela Davenport.  Follow-up: Return if symptoms worsen or fail to improve.  Roselee Culver, MD

## 2015-06-07 NOTE — Patient Instructions (Signed)

## 2015-06-24 ENCOUNTER — Other Ambulatory Visit: Payer: Self-pay | Admitting: *Deleted

## 2015-06-24 ENCOUNTER — Other Ambulatory Visit: Payer: Self-pay | Admitting: Internal Medicine

## 2015-06-24 MED ORDER — RIVAROXABAN 20 MG PO TABS: 20.0000 mg | ORAL_TABLET | Freq: Every day | ORAL | Status: DC

## 2015-06-30 ENCOUNTER — Other Ambulatory Visit: Payer: Self-pay | Admitting: Obstetrics

## 2015-06-30 ENCOUNTER — Ambulatory Visit: Payer: 59

## 2015-06-30 DIAGNOSIS — E2839 Other primary ovarian failure: Secondary | ICD-10-CM

## 2015-08-11 ENCOUNTER — Ambulatory Visit
Admission: RE | Admit: 2015-08-11 | Discharge: 2015-08-11 | Disposition: A | Discharge status: Home / Self Care | Payer: 59 | Source: Ambulatory Visit | Attending: Obstetrics | Admitting: Obstetrics

## 2015-08-11 DIAGNOSIS — E2839 Other primary ovarian failure: Secondary | ICD-10-CM

## 2015-08-11 DIAGNOSIS — Z1231 Encounter for screening mammogram for malignant neoplasm of breast: Secondary | ICD-10-CM

## 2015-08-13 ENCOUNTER — Other Ambulatory Visit: Payer: Self-pay | Admitting: Orthopedic Surgery

## 2015-08-13 DIAGNOSIS — M533 Sacrococcygeal disorders, not elsewhere classified: Secondary | ICD-10-CM

## 2015-08-28 ENCOUNTER — Ambulatory Visit
Admission: RE | Admit: 2015-08-28 | Discharge: 2015-08-28 | Disposition: A | Discharge status: Home / Self Care | Payer: 59 | Source: Ambulatory Visit | Attending: Orthopedic Surgery | Admitting: Orthopedic Surgery

## 2015-08-28 DIAGNOSIS — M533 Sacrococcygeal disorders, not elsewhere classified: Secondary | ICD-10-CM

## 2015-08-28 MED ORDER — IOPAMIDOL (ISOVUE-300) INJECTION 61%
125.0000 mL | Freq: Once | INTRAVENOUS | Status: AC | PRN
  Administered 2015-08-28: 125 mL via INTRAVENOUS

## 2015-09-25 MED FILL — XARELTO 20 MG TABLET: 20 | 90 days supply | Qty: 90 | Fill #3

## 2015-09-29 DIAGNOSIS — M65352 Trigger finger, left little finger: Secondary | ICD-10-CM | POA: Diagnosis not present

## 2015-10-03 MED FILL — CIPROFLOXACIN HCL 500 MG TA: 500 | 10 days supply | Qty: 20 | Fill #0

## 2015-10-03 MED FILL — ACETAMINOPHEN/COD #3 TABLET: 300-30 | 3 days supply | Qty: 40 | Fill #0

## 2015-10-27 ENCOUNTER — Ambulatory Visit (INDEPENDENT_AMBULATORY_CARE_PROVIDER_SITE_OTHER): Payer: 59

## 2015-10-27 DIAGNOSIS — Z5181 Encounter for therapeutic drug level monitoring: Secondary | ICD-10-CM

## 2015-10-27 DIAGNOSIS — I4891 Unspecified atrial fibrillation: Secondary | ICD-10-CM

## 2015-10-27 LAB — CBC
HCT: 39.7 % (ref 36.0–46.0)
Hemoglobin: 13.1 g/dL (ref 12.0–15.0)
MCH: 27.2 pg (ref 26.0–34.0)
MCHC: 33 g/dL (ref 30.0–36.0)
MCV: 82.4 fL (ref 78.0–100.0)
MPV: 9.8 fL (ref 8.6–12.4)
Platelets: 252 10*3/uL (ref 150–400)
RBC: 4.82 MIL/uL (ref 3.87–5.11)
RDW: 13.5 % (ref 11.5–15.5)
WBC: 8.3 10*3/uL (ref 4.0–10.5)

## 2015-10-27 LAB — BASIC METABOLIC PANEL WITH GFR
BUN: 22 mg/dL (ref 7–25)
CO2: 26 mmol/L (ref 20–31)
Calcium: 9.5 mg/dL (ref 8.6–10.4)
Chloride: 102 mmol/L (ref 98–110)
Creat: 1.02 mg/dL (ref 0.50–1.05)
Glucose, Bld: 110 mg/dL — ABNORMAL HIGH (ref 65–99)
Potassium: 4.2 mmol/L (ref 3.5–5.3)
Sodium: 138 mmol/L (ref 135–146)

## 2015-10-28 NOTE — Progress Notes (Signed)
Pt was started on Xarelto 20mg  QD for afib on 04/26/13 by Dr Rayann Heman.    Reviewed patients medication list.  Pt is not currently on any combined P-gp and strong CYP3A4 inhibitors/inducers (ketoconazole, traconazole, ritonavir, carbamazepine, phenytoin, rifampin, St. John's wort).  Reviewed labs.  SCr 1.02, Weight 110.8 kg, CrCl- 103.88.  Dose appropriate based on CrCl.   Hgb and HCT Within Normal Limits 13.1/39.7 on 10/27/15  A full discussion of the nature of anticoagulants has been carried out.  A benefit/risk analysis has been presented to the patient, so that they understand the justification for choosing anticoagulation with Xarelto at this time.  The need for compliance is stressed.  Pt is aware to take the medication once daily with the largest meal of the day.  Side effects of potential bleeding are discussed, including unusual colored urine or stools, coughing up blood or coffee ground emesis, nose bleeds or serious fall or head trauma.  Discussed signs and symptoms of stroke. The patient should avoid any OTC items containing aspirin or ibuprofen.  Avoid alcohol consumption.   Call if any signs of abnormal bleeding.  Discussed financial obligations and resolved any difficulty in obtaining medication.  Next lab test test in 6 months.  Labwork WNL continue on Xarelto 20mg  QD.  Recheck CBC and BMP in 6 months by MD, no need to continue f/u in [facility].

## 2015-10-31 DIAGNOSIS — M533 Sacrococcygeal disorders, not elsewhere classified: Secondary | ICD-10-CM | POA: Diagnosis not present

## 2015-11-10 DIAGNOSIS — L820 Inflamed seborrheic keratosis: Secondary | ICD-10-CM | POA: Diagnosis not present

## 2015-11-10 DIAGNOSIS — B078 Other viral warts: Secondary | ICD-10-CM | POA: Diagnosis not present

## 2015-11-12 DIAGNOSIS — N9089 Other specified noninflammatory disorders of vulva and perineum: Secondary | ICD-10-CM | POA: Diagnosis not present

## 2015-11-12 DIAGNOSIS — D1809 Hemangioma of other sites: Secondary | ICD-10-CM | POA: Diagnosis not present

## 2015-11-12 DIAGNOSIS — Z1272 Encounter for screening for malignant neoplasm of vagina: Secondary | ICD-10-CM | POA: Diagnosis not present

## 2015-11-28 MED FILL — NADOLOL 20 MG TABLET: 20 | 90 days supply | Qty: 90 | Fill #3

## 2015-12-07 ENCOUNTER — Encounter: Payer: Self-pay | Admitting: Internal Medicine

## 2015-12-07 ENCOUNTER — Telehealth: Payer: Self-pay | Admitting: Internal Medicine

## 2015-12-07 NOTE — Telephone Encounter (Signed)
Received call that while patient was working at [employer], she went into AFib (confirmed by unit ECG).  She has been prescribed flecanide for chemical cardioversion and is reliably taking her Xarelto and nadolol.  She will take her flecanide as presribed for acute atrial fibrillation and call our office Monday regardless to check in on status.  Prior documentation from 2013 shows took roughly 7 hrs to cardiovert with flecanide.  Thompson Grayer is primary EP.   GTM

## 2015-12-08 ENCOUNTER — Telehealth: Payer: Self-pay | Admitting: Internal Medicine

## 2015-12-08 NOTE — Telephone Encounter (Signed)
Left message for patient that I recieved her message and she can call me back.  She is due for follow up in May with Dr Rayann Heman

## 2015-12-08 NOTE — Telephone Encounter (Signed)
New message   12/06/15 Dr[facility] she went into A-Fib   6 hours she went into regular rhythm pt took her medication  She feels better now she wants to update rn

## 2015-12-22 DIAGNOSIS — Z0000 Encounter for general adult medical examination without abnormal findings: Secondary | ICD-10-CM | POA: Diagnosis not present

## 2015-12-22 DIAGNOSIS — N3941 Urge incontinence: Secondary | ICD-10-CM | POA: Diagnosis not present

## 2015-12-22 DIAGNOSIS — N390 Urinary tract infection, site not specified: Secondary | ICD-10-CM | POA: Diagnosis not present

## 2016-01-01 ENCOUNTER — Encounter (HOSPITAL_BASED_OUTPATIENT_CLINIC_OR_DEPARTMENT_OTHER): Payer: Self-pay | Admitting: *Deleted

## 2016-01-01 ENCOUNTER — Emergency Department (HOSPITAL_BASED_OUTPATIENT_CLINIC_OR_DEPARTMENT_OTHER)
Admission: EM | Admit: 2016-01-01 | Discharge: 2016-01-01 | Disposition: A | Discharge status: Home / Self Care | Payer: 59 | Attending: Emergency Medicine | Admitting: Emergency Medicine

## 2016-01-01 DIAGNOSIS — I10 Essential (primary) hypertension: Secondary | ICD-10-CM | POA: Diagnosis not present

## 2016-01-01 DIAGNOSIS — Z87891 Personal history of nicotine dependence: Secondary | ICD-10-CM | POA: Diagnosis not present

## 2016-01-01 DIAGNOSIS — J069 Acute upper respiratory infection, unspecified: Secondary | ICD-10-CM | POA: Diagnosis not present

## 2016-01-01 DIAGNOSIS — R05 Cough: Secondary | ICD-10-CM | POA: Diagnosis present

## 2016-01-01 MED ORDER — BENZONATATE 100 MG PO CAPS
200.0000 mg | ORAL_CAPSULE | Freq: Two times a day (BID) | ORAL | Status: DC | PRN
Start: 2016-01-01 — End: 2016-02-13

## 2016-01-01 MED FILL — ACETAMINOPHEN/COD #3 TABLET: 300-30 | 3 days supply | Qty: 40 | Fill #1

## 2016-01-01 MED FILL — XARELTO 20 MG TABLET: 20 | 30 days supply | Qty: 30 | Fill #4

## 2016-01-01 MED FILL — BENZONATATE 100 MG CAPSULE: 100 | 5 days supply | Qty: 20 | Fill #0

## 2016-01-01 NOTE — Discharge Instructions (Signed)
Upper Respiratory Infection, Adult Most upper respiratory infections (URIs) are a viral infection of the air passages leading to the lungs. A URI affects the nose, throat, and upper air passages. The most common type of URI is nasopharyngitis and is typically referred to as "the common cold." URIs run their course and usually go away on their own. Most of the time, a URI does not require medical attention, but sometimes a bacterial infection in the upper airways can follow a viral infection. This is called a secondary infection. Sinus and middle ear infections are common types of secondary upper respiratory infections. Bacterial pneumonia can also complicate a URI. A URI can worsen asthma and chronic obstructive pulmonary disease (COPD). Sometimes, these complications can require emergency medical care and may be life threatening.  CAUSES Almost all URIs are caused by viruses. A virus is a type of germ and can spread from one person to another.  RISKS FACTORS You may be at risk for a URI if:   You smoke.   You have chronic heart or lung disease.  You have a weakened defense (immune) system.   You are very young or very old.   You have nasal allergies or asthma.  You work in crowded or poorly ventilated areas.  You work in health care facilities or schools. SIGNS AND SYMPTOMS  Symptoms typically develop 2-3 days after you come in contact with a cold virus. Most viral URIs last 7-10 days. However, viral URIs from the influenza virus (flu virus) can last 14-18 days and are typically more severe. Symptoms may include:   Runny or stuffy (congested) nose.   Sneezing.   Cough.   Sore throat.   Headache.   Fatigue.   Fever.   Loss of appetite.   Pain in your forehead, behind your eyes, and over your cheekbones (sinus pain).  Muscle aches.  DIAGNOSIS  Your health care provider may diagnose a URI by:  Physical exam.  Tests to check that your symptoms are not due to  another condition such as:  Strep throat.  Sinusitis.  Pneumonia.  Asthma. TREATMENT  A URI goes away on its own with time. It cannot be cured with medicines, but medicines may be prescribed or recommended to relieve symptoms. Medicines may help:  Reduce your fever.  Reduce your cough.  Relieve nasal congestion. HOME CARE INSTRUCTIONS   Take medicines only as directed by your health care provider.   Gargle warm saltwater or take cough drops to comfort your throat as directed by your health care provider.  Use a warm mist humidifier or inhale steam from a shower to increase air moisture. This may make it easier to breathe.  Drink enough fluid to keep your urine clear or pale yellow.   Eat soups and other clear broths and maintain good nutrition.   Rest as needed.   Return to work when your temperature has returned to normal or as your health care provider advises. You may need to stay home longer to avoid infecting others. You can also use a face mask and careful hand washing to prevent spread of the virus.  Increase the usage of your inhaler if you have asthma.   Do not use any tobacco products, including cigarettes, chewing tobacco, or electronic cigarettes. If you need help quitting, ask your health care provider. PREVENTION  The best way to protect yourself from getting a cold is to practice good hygiene.   Avoid oral or hand contact with people with cold   symptoms.   Wash your hands often if contact occurs.  There is no clear evidence that vitamin C, vitamin E, echinacea, or exercise reduces the chance of developing a cold. However, it is always recommended to get plenty of rest, exercise, and practice good nutrition.  SEEK MEDICAL CARE IF:   You are getting worse rather than better.   Your symptoms are not controlled by medicine.   You have chills.  You have worsening shortness of breath.  You have brown or red mucus.  You have yellow or brown nasal  discharge.  You have pain in your face, especially when you bend forward.  You have a fever.  You have swollen neck glands.  You have pain while swallowing.  You have white areas in the back of your throat. SEEK IMMEDIATE MEDICAL CARE IF:   You have severe or persistent:  Headache.  Ear pain.  Sinus pain.  Chest pain.  You have chronic lung disease and any of the following:  Wheezing.  Prolonged cough.  Coughing up blood.  A change in your usual mucus.  You have a stiff neck.  You have changes in your:  Vision.  Hearing.  Thinking.  Mood. MAKE SURE YOU:   Understand these instructions.  Will watch your condition.  Will get help right away if you are not doing well or get worse.   This information is not intended to replace advice given to you by your health care provider. Make sure you discuss any questions you have with your health care provider.   Document Released: 02/09/2001 Document Revised: 12/31/2014 Document Reviewed: 11/21/2013 Elsevier Interactive Patient Education 2016 [employer] Inc.  

## 2016-01-01 NOTE — ED Notes (Signed)
Pt reports sore throat and ear aches x Sunday, cough x yesterday with fevers up to 101.5, has taken tylenol this am. Also c/o body aches and chills.

## 2016-01-01 NOTE — ED Provider Notes (Signed)
CSN: [id number]     Arrival date & time 01/01/16  1039 History   First MD Initiated Contact with Patient 01/01/16 1050     Chief Complaint  Patient presents with  . Cough     (Consider location/radiation/quality/duration/timing/severity/associated sxs/prior Treatment) HPI Comments: Patient presents to the emergency department with chief complaint of cough and cold symptoms. She reports sore throat, earaches, and cough 3 days. She reports intermittent fevers and chills. States that she measured a temperature to 101.5. She took Tylenol for this with good relief. She also complains of some generalized body aches. She does have a history of seasonal allergies, and states that she was recently in [place], and felt like pollen was worse than normal. She states that apart from the fever, her symptoms have been similar to allergies in the past. Patient is an Therapist, sports at [employer].  The history is provided by the patient. No language interpreter was used.    Past Medical History  Diagnosis Date  . Symptomatic premature ventricular contractions     improved s/p ablation  . Paroxysmal atrial fibrillation (HCC)   . PE (pulmonary embolism)     chronically anticoagulated with coumadin  . DVT (deep venous thrombosis) (HCC)     recurrent; chronically anticoagulated with coumadin  . Strep throat at age 67    The patient reports a severe strep throat infection at age 39 and   is not clear as to whether or not she may have had rheumatic fever.  Marland Kitchen Uterine bleeding 2008    Uterine artery embolization in 2008 for uterine bleeding.  Marland Kitchen Atypical chest pain     normal myoview 11/11/10  . PONV (postoperative nausea and vomiting)   . Herniated lumbar intervertebral disc   . Family history of anesthesia complication     Sister had decrease in respirations after surgery  . Pneumonia 07/20/12  . Urgency of urination   . Sickle cell trait ([place])   . Blood transfusion without reported diagnosis   . Heart  murmur   . Hypertension    Past Surgical History  Procedure Laterality Date  . Uterine artery embolization  2008    for uterine bleeding  . Pvc ablation  2010  . Diagnostic d&c hysteroscopy and novasure ablation  03/17/2004  . Vascular surgery      laser of left leg  . Dilation and curettage of uterus  12/17/2011    Procedure: DILATATION AND CURETTAGE;  Surgeon: Frederico Hamman, MD;  Location: [facility];  Service: Gynecology;  Laterality: N/A;  With Attempted Hydrothermal Ablation  . Tonsillectomy    . Colonoscopy w/ polypectomy    . Mandible surgery      under bite  . Lumbar laminectomy/decompression microdiscectomy Left 02/21/2013    Procedure: LUMBAR LAMINECTOMY/DECOMPRESSION MICRODISCECTOMY 1 LEVEL;  Surgeon: Winfield Cunas, MD;  Location: [facility];  Service: Neurosurgery;  Laterality: Left;  LEFT Lumbar Three-Four Laminotomy foraminotomy microdiskectomy  . Spine surgery     Family History  Problem Relation Age of Onset  . Hypertension Mother   . Diabetes Mother    Social History  Substance Use Topics  . Smoking status: Former Research scientist (life sciences)  . Smokeless tobacco: None  . Alcohol Use: Yes     Comment: rarely   OB History    No data available     Review of Systems  Constitutional: Positive for fever and chills.  HENT: Positive for postnasal drip, rhinorrhea, sinus pressure, sneezing and sore throat.  Respiratory: Positive for cough. Negative for shortness of breath.   Cardiovascular: Negative for chest pain.  Gastrointestinal: Negative for nausea, vomiting, abdominal pain, diarrhea and constipation.  Genitourinary: Negative for dysuria.  All other systems reviewed and are negative.     Allergies  Methylprednisolone and Sulfonamide derivatives  Home Medications   Prior to Admission medications   Medication Sig Start Date End Date Taking? Authorizing Provider  benzonatate (TESSALON) 100 MG capsule Take 2 capsules (200 mg total) by mouth 2 (two) times daily as needed  for cough. 01/01/16   Montine Circle, PA-C  flecainide (TAMBOCOR) 150 MG tablet Take 2 at onset of afib 01/13/15   Thompson Grayer, MD  hydrOXYzine (ATARAX/VISTARIL) 25 MG tablet Take 1 tablet (25 mg total) by mouth 3 (three) times daily as needed for itching. 06/07/15   Roselee Culver, MD  nadolol (CORGARD) 20 MG tablet Take 1 tablet (20 mg total) by mouth daily. 01/13/15   Thompson Grayer, MD  rivaroxaban (XARELTO) 20 MG TABS tablet Take 1 tablet (20 mg total) by mouth daily with supper. 06/24/15   Thompson Grayer, MD  VOLTAREN 1 % GEL Apply 2 g topically 4 (four) times daily as needed (pain).  07/04/14   Historical Provider, MD   BP 155/81 mmHg  Pulse 97  Temp(Src) 99.4 F (37.4 C) (Oral)  Resp 20  Ht 5\' 9"  (1.753 m)  Wt 110.224 kg  BMI 35.87 kg/m2  SpO2 99% Physical Exam  Constitutional: She appears well-developed and well-nourished. No distress.  HENT:  Head: Normocephalic.  Right Ear: External ear normal.  Left Ear: External ear normal.  Mildly erythematous, no tonsillar exudate, no abscess, no stridor, uvula is midline  TMs clear bilaterally  Eyes: Conjunctivae and EOM are normal. Pupils are equal, round, and reactive to light.  Neck: Normal range of motion. Neck supple.  Cardiovascular: Normal rate, regular rhythm and normal heart sounds.  Exam reveals no gallop and no friction rub.   No murmur heard. Pulmonary/Chest: Effort normal and breath sounds normal. No stridor. No respiratory distress. She has no wheezes. She has no rales. She exhibits no tenderness.  CTAB  Abdominal: Soft. Bowel sounds are normal. She exhibits no distension. There is no tenderness.  Musculoskeletal: Normal range of motion. She exhibits no tenderness.  Neurological: She is alert.  Skin: Skin is warm and dry. No rash noted. She is not diaphoretic.  Psychiatric: She has a normal mood and affect. Her behavior is normal. Judgment and thought content normal.  Nursing note and vitals reviewed.   ED Course   Procedures (including critical care time)   MDM   Final diagnoses:  URI (upper respiratory infection)    Patients symptoms are consistent with URI, likely viral etiology. Offered chest x-ray, patient declined. Has hx of DVT/PE, but is on Xarelto, no CP or SOB.  VSS, no tachycardia or hypoxia. Discussed that antibiotics are not indicated for viral infections. Pt will be discharged with symptomatic treatment.  Verbalizes understanding and is agreeable with plan. Pt is hemodynamically stable & in NAD prior to dc.     Montine Circle, PA-C 01/01/16 [place], MD 01/01/16 1310

## 2016-02-02 MED FILL — XARELTO 20 MG TABLET: 20 | 90 days supply | Qty: 90 | Fill #0

## 2016-02-13 ENCOUNTER — Encounter: Payer: Self-pay | Admitting: Internal Medicine

## 2016-02-13 ENCOUNTER — Ambulatory Visit (INDEPENDENT_AMBULATORY_CARE_PROVIDER_SITE_OTHER): Payer: 59 | Admitting: Internal Medicine

## 2016-02-13 DIAGNOSIS — I480 Paroxysmal atrial fibrillation: Secondary | ICD-10-CM | POA: Diagnosis not present

## 2016-02-13 DIAGNOSIS — R072 Precordial pain: Secondary | ICD-10-CM

## 2016-02-13 DIAGNOSIS — I4891 Unspecified atrial fibrillation: Secondary | ICD-10-CM | POA: Diagnosis not present

## 2016-02-13 LAB — BASIC METABOLIC PANEL
BUN: 13 mg/dL (ref 7–25)
CO2: 27 mmol/L (ref 20–31)
Calcium: 9.5 mg/dL (ref 8.6–10.4)
Chloride: 106 mmol/L (ref 98–110)
Creat: 0.81 mg/dL (ref 0.50–1.05)
Glucose, Bld: 100 mg/dL — ABNORMAL HIGH (ref 65–99)
Potassium: 4.8 mmol/L (ref 3.5–5.3)
Sodium: 140 mmol/L (ref 135–146)

## 2016-02-13 LAB — CBC WITH DIFFERENTIAL/PLATELET
Basophils Absolute: 0 cells/uL (ref 0–200)
Basophils Relative: 0 %
Eosinophils Absolute: 136 cells/uL (ref 15–500)
Eosinophils Relative: 2 %
HCT: 38.8 % (ref 35.0–45.0)
Hemoglobin: 12.8 g/dL (ref 11.7–15.5)
Lymphocytes Relative: 20 %
Lymphs Abs: 1360 cells/uL (ref 850–3900)
MCH: 26.5 pg — ABNORMAL LOW (ref 27.0–33.0)
MCHC: 33 g/dL (ref 32.0–36.0)
MCV: 80.3 fL (ref 80.0–100.0)
MPV: 9.5 fL (ref 7.5–12.5)
Monocytes Absolute: 476 cells/uL (ref 200–950)
Monocytes Relative: 7 %
Neutro Abs: 4828 cells/uL (ref 1500–7800)
Neutrophils Relative %: 71 %
Platelets: 265 10*3/uL (ref 140–400)
RBC: 4.83 MIL/uL (ref 3.80–5.10)
RDW: 14 % (ref 11.0–15.0)
WBC: 6.8 10*3/uL (ref 3.8–10.8)

## 2016-02-13 MED ORDER — NADOLOL 20 MG PO TABS: 20.0000 mg | ORAL_TABLET | Freq: Every day | ORAL | Status: DC

## 2016-02-13 MED ORDER — FLECAINIDE ACETATE 150 MG PO TABS: ORAL_TABLET | ORAL | Status: DC

## 2016-02-13 MED ORDER — RIVAROXABAN 20 MG PO TABS: 20.0000 mg | ORAL_TABLET | Freq: Every day | ORAL | Status: DC

## 2016-02-13 MED ORDER — LOSARTAN POTASSIUM 25 MG PO TABS: 25.0000 mg | ORAL_TABLET | Freq: Every day | ORAL | Status: DC

## 2016-02-13 MED FILL — LOSARTAN POTASSIUM 25 MG TA: 25 | 90 days supply | Qty: 90 | Fill #0

## 2016-02-13 MED FILL — FLECAINIDE ACETATE 150 MG T: 150 | 15 days supply | Qty: 30 | Fill #0

## 2016-02-13 NOTE — Progress Notes (Signed)
Electrophysiology Office Note   Date:  02/13/2016   ID:  Pamela Davenport, DOB [birth year], MRN [id number]  PCP:  Scarlette Calico, MD   Primary Electrophysiologist: Thompson Grayer, MD    Chief Complaint  Patient presents with  . Atrial Fibrillation     History of Present Illness: Pamela Davenport is a 60 y.o. female who presents today for electrophysiology evaluation.   Doing well.  She did have an episode of afib in April for which she took flecainide.  She converted to sinus in about 6 hours.  She is unaware of any other episodes.  PVCs are stable.  Today, she denies symptoms shortness of breath, orthopnea, PND, lower extremity edema, claudication, dizziness, presyncope, syncope, bleeding, or neurologic sequela. The patient is tolerating medications without difficulties and is otherwise without complaint today.    Past Medical History  Diagnosis Date  . Symptomatic premature ventricular contractions     improved s/p ablation  . Paroxysmal atrial fibrillation (HCC)   . PE (pulmonary embolism)     chronically anticoagulated with coumadin  . DVT (deep venous thrombosis) (HCC)     recurrent; chronically anticoagulated with coumadin  . Strep throat at age 58    The patient reports a severe strep throat infection at age 52 and   is not clear as to whether or not she may have had rheumatic fever.  Marland Kitchen Uterine bleeding 2008    Uterine artery embolization in 2008 for uterine bleeding.  Marland Kitchen Atypical chest pain     normal myoview 11/11/10  . PONV (postoperative nausea and vomiting)   . Herniated lumbar intervertebral disc   . Family history of anesthesia complication     Sister had decrease in respirations after surgery  . Pneumonia 07/20/12  . Urgency of urination   . Sickle cell trait ([place])   . Blood transfusion without reported diagnosis   . Heart murmur   . Hypertension    Past Surgical History  Procedure Laterality Date  . Uterine artery embolization  2008    for uterine bleeding   . Pvc ablation  2010  . Diagnostic d&c hysteroscopy and novasure ablation  03/17/2004  . Vascular surgery      laser of left leg  . Dilation and curettage of uterus  12/17/2011    Procedure: DILATATION AND CURETTAGE;  Surgeon: Frederico Hamman, MD;  Location: [facility];  Service: Gynecology;  Laterality: N/A;  With Attempted Hydrothermal Ablation  . Tonsillectomy    . Colonoscopy w/ polypectomy    . Mandible surgery      under bite  . Lumbar laminectomy/decompression microdiscectomy Left 02/21/2013    Procedure: LUMBAR LAMINECTOMY/DECOMPRESSION MICRODISCECTOMY 1 LEVEL;  Surgeon: Winfield Cunas, MD;  Location: [facility];  Service: Neurosurgery;  Laterality: Left;  LEFT Lumbar Three-Four Laminotomy foraminotomy microdiskectomy  . Spine surgery       Current Outpatient Prescriptions  Medication Sig Dispense Refill  . flecainide (TAMBOCOR) 150 MG tablet Take 2 tablets by mouth at the onset of AFIB    . nadolol (CORGARD) 20 MG tablet Take 1 tablet (20 mg total) by mouth daily. 90 tablet 3  . rivaroxaban (XARELTO) 20 MG TABS tablet Take 1 tablet (20 mg total) by mouth daily with supper. 30 tablet 6   No current facility-administered medications for this visit.    Allergies:   Methylprednisolone; Sulfa antibiotics; and Sulfonamide derivatives   Social History:  The patient  reports that she has quit smoking.  She does not have any smokeless tobacco history on file. She reports that she drinks alcohol. She reports that she does not use illicit drugs.   Family History:  The patient's  family history includes Diabetes in her mother; Hypertension in her mother.    ROS:  Please see the history of present illness.   All other systems are reviewed and negative.    PHYSICAL EXAM: VS:  BP 142/98 mmHg  Pulse 82  Ht 5\' 9"  (1.753 m)  Wt 248 lb 9.6 oz (112.764 kg)  BMI 36.69 kg/m2 , BMI Body mass index is 36.69 kg/(m^2). GEN: Well nourished, well developed, in no acute distress HEENT:  normal Neck: no JVD, carotid bruits, or masses Cardiac: RRR; no murmurs, rubs, or gallops,no edema  Respiratory:  clear to auscultation bilaterally, normal work of breathing GI: soft, nontender, nondistended, + BS MS: no deformity or atrophy Skin: warm and dry  Neuro:  Strength and sensation are intact Psych: euthymic mood, full affect  EKG:  EKG is ordered today. The ekg ordered today shows sinus rhythm    Recent Labs: 10/27/2015: BUN 22; Creat 1.02; Hemoglobin 13.1; Platelets 252; Potassium 4.2; Sodium 138    Lipid Panel     Component Value Date/Time   CHOL 184 02/03/2015 0933   TRIG 87.0 02/03/2015 0933   HDL 45.50 02/03/2015 0933   CHOLHDL 4 02/03/2015 0933   VLDL 17.4 02/03/2015 0933   LDLCALC 121* 02/03/2015 0933     Wt Readings from Last 3 Encounters:  02/13/16 248 lb 9.6 oz (112.764 kg)  01/01/16 243 lb (110.224 kg)  06/07/15 244 lb 3.2 oz (110.768 kg)      ASSESSMENT AND PLAN:  1.  Hypertension Above goal 2 gram sodium diet and weight reduction encouraged Did not tolerate hctz previously Will add losartan 25mg  daily which has been shown to improve atrial fibrosis related to afib bmet today Follow-up w Dr Scarlette Calico  2. pvcs Well controlled  3. afib Well controlled On xarelto given prior h/o pte also Bmet, cbc today Echo to evaluate for structural changes related to afib I have given her our AF clinic flyer today  Current medicines are reviewed at length with the patient today.   The patient does not have concerns regarding her medicines.  The following changes were made today:  none  Follow-up: due to see primary care, I will see again in 1 year   Signed, Thompson Grayer, MD  02/13/2016 10:16 AM     [facility] [address] [place] [address] [phone] (office) [phone] (fax)

## 2016-02-13 NOTE — Patient Instructions (Signed)
Medication Instructions:  Your physician has recommended you make the following change in your medication:  1) Start Losartan 25 mg daily   Labwork: Your physician recommends that you return for lab work today: BMP/CBC   Testing/Procedures: Your physician has requested that you have an echocardiogram. Echocardiography is a painless test that uses sound waves to create images of your heart. It provides your doctor with information about the size and shape of your heart and how well your heart's chambers and valves are working. This procedure takes approximately one hour. There are no restrictions for this procedure.    Follow-Up: Your physician recommends that you schedule a follow-up appointment---due for follow up wit Dr Edmonia Lynch call and arrange  Your physician wants you to follow-up in: 12 months with Dr Vallery Ridge will receive a reminder letter in the mail two months in advance. If you don't receive a letter, please call our office to schedule the follow-up appointment.    Any Other Special Instructions Will Be Listed Below (If Applicable).     If you need a refill on your cardiac medications before your next appointment, please call your pharmacy.

## 2016-02-25 MED FILL — ACETAMINOPHEN/COD #3 TABLET: 300-30 | 3 days supply | Qty: 40 | Fill #2

## 2016-02-25 MED FILL — TOBRAMYCIN-DEXAMETH OPTH SU: 0.3-0.1 | 8 days supply | Qty: 5 | Fill #0

## 2016-02-25 MED FILL — NADOLOL 20 MG TABLET: 20 | 90 days supply | Qty: 90 | Fill #0

## 2016-03-03 ENCOUNTER — Other Ambulatory Visit: Payer: Self-pay

## 2016-03-03 ENCOUNTER — Ambulatory Visit (HOSPITAL_COMMUNITY): Payer: 59 | Attending: Cardiovascular Disease

## 2016-03-03 DIAGNOSIS — I119 Hypertensive heart disease without heart failure: Secondary | ICD-10-CM | POA: Insufficient documentation

## 2016-03-03 DIAGNOSIS — I4891 Unspecified atrial fibrillation: Secondary | ICD-10-CM | POA: Diagnosis present

## 2016-03-03 DIAGNOSIS — I480 Paroxysmal atrial fibrillation: Secondary | ICD-10-CM | POA: Diagnosis not present

## 2016-03-03 LAB — ECHOCARDIOGRAM COMPLETE
E decel time: 222 msec
E/e' ratio: 9.43
FS: 34 % (ref 28–44)
IVS/LV PW RATIO, ED: 1.12
LA ID, A-P, ES: 38 mm
LA diam end sys: 38 mm
LA diam index: 1.59 cm/m2
LA vol A4C: 49 ml
LA vol index: 24.7 mL/m2
LA vol: 59 mL
LV E/e' medial: 9.43
LV E/e'average: 9.43
LV PW d: 11.4 mm — AB (ref 0.6–1.1)
LV e' LATERAL: 9.87 cm/s
LVOT SV: 60 mL
LVOT VTI: 19 cm
LVOT area: 3.14 cm2
LVOT diameter: 20 mm
LVOT peak grad rest: 3 mmHg
LVOT peak vel: 93.1 cm/s
MV Dec: 222
MV Peak grad: 3 mmHg
MV pk A vel: 69.7 m/s
MV pk E vel: 93.1 m/s
Reg peak vel: 244 cm/s
TDI e' lateral: 9.87
TDI e' medial: 9.32
TR max vel: 244 cm/s

## 2016-03-11 ENCOUNTER — Other Ambulatory Visit: Payer: Self-pay | Admitting: Obstetrics

## 2016-03-11 DIAGNOSIS — L02214 Cutaneous abscess of groin: Secondary | ICD-10-CM

## 2016-03-11 MED ORDER — CLINDAMYCIN HCL 300 MG PO CAPS: 300.0000 mg | ORAL_CAPSULE | Freq: Three times a day (TID) | ORAL | Status: DC

## 2016-03-11 MED FILL — CLINDAMYCIN HCL 300 MG CAPS: 300 | 10 days supply | Qty: 30 | Fill #0

## 2016-05-04 MED FILL — XARELTO 20 MG TABLET: 20 | 90 days supply | Qty: 90 | Fill #0

## 2016-05-20 ENCOUNTER — Other Ambulatory Visit: Payer: Self-pay | Admitting: Obstetrics

## 2016-05-20 DIAGNOSIS — L509 Urticaria, unspecified: Secondary | ICD-10-CM

## 2016-05-20 MED ORDER — HYDROXYZINE HCL 25 MG PO TABS: 25.0000 mg | ORAL_TABLET | Freq: Three times a day (TID) | ORAL | 2 refills | Status: DC | PRN

## 2016-05-20 MED FILL — hydrOXYzine HCL 25 MG TABS: 25 | 10 days supply | Qty: 30 | Fill #0

## 2016-05-20 MED FILL — CIPROFLOXACIN HCL 500 MG TA: 500 | 10 days supply | Qty: 20 | Fill #1

## 2016-05-20 MED FILL — LOSARTAN POTASSIUM 25 MG TA: 25 | 90 days supply | Qty: 90 | Fill #1

## 2016-05-20 MED FILL — NADOLOL 20 MG TABLET: 20 | 90 days supply | Qty: 90 | Fill #1

## 2016-07-29 MED FILL — XARELTO 20 MG TABLET: 20 | 90 days supply | Qty: 90 | Fill #1

## 2016-08-17 ENCOUNTER — Encounter (HOSPITAL_COMMUNITY): Payer: Self-pay | Admitting: Nurse Practitioner

## 2016-08-17 ENCOUNTER — Ambulatory Visit (HOSPITAL_COMMUNITY)
Admission: RE | Admit: 2016-08-17 | Discharge: 2016-08-17 | Disposition: A | Discharge status: Home / Self Care | Payer: 59 | Source: Ambulatory Visit | Attending: Nurse Practitioner | Admitting: Nurse Practitioner

## 2016-08-17 DIAGNOSIS — I4891 Unspecified atrial fibrillation: Secondary | ICD-10-CM | POA: Diagnosis not present

## 2016-08-17 NOTE — Progress Notes (Addendum)
Pt reported having "pounding pulse" at 3 this morning. She has come in for EKG.  HR 98, BP 130/86.  EKG to be reviewed by Roderic Palau, NP  Pt is in SR. She felt that she was in afib earlier this am. She wanted to confirm if was afib before she took pill in pocket flecainide . She is feeling better now and was reassured that she was in SR.

## 2016-08-20 MED FILL — LOSARTAN POTASSIUM 25 MG TA: 25 | 90 days supply | Qty: 90 | Fill #2

## 2016-08-20 MED FILL — NADOLOL 20 MG TABLET: 20 | 90 days supply | Qty: 90 | Fill #2

## 2016-09-21 MED FILL — CIPROFLOXACIN HCL 500 MG TA: 500 | 10 days supply | Qty: 20 | Fill #2

## 2016-10-01 MED FILL — CIPROFLOXACIN HCL 500 MG TA: 500 | 10 days supply | Qty: 20 | Fill #3

## 2016-10-02 ENCOUNTER — Emergency Department (HOSPITAL_BASED_OUTPATIENT_CLINIC_OR_DEPARTMENT_OTHER): Payer: 59

## 2016-10-02 ENCOUNTER — Encounter (HOSPITAL_BASED_OUTPATIENT_CLINIC_OR_DEPARTMENT_OTHER): Payer: Self-pay | Admitting: Emergency Medicine

## 2016-10-02 DIAGNOSIS — M791 Myalgia: Secondary | ICD-10-CM | POA: Insufficient documentation

## 2016-10-02 DIAGNOSIS — R509 Fever, unspecified: Secondary | ICD-10-CM | POA: Insufficient documentation

## 2016-10-02 DIAGNOSIS — Z87891 Personal history of nicotine dependence: Secondary | ICD-10-CM | POA: Insufficient documentation

## 2016-10-02 DIAGNOSIS — J111 Influenza due to unidentified influenza virus with other respiratory manifestations: Secondary | ICD-10-CM | POA: Diagnosis not present

## 2016-10-02 DIAGNOSIS — Z79899 Other long term (current) drug therapy: Secondary | ICD-10-CM | POA: Diagnosis not present

## 2016-10-02 DIAGNOSIS — R51 Headache: Secondary | ICD-10-CM | POA: Insufficient documentation

## 2016-10-02 DIAGNOSIS — R05 Cough: Secondary | ICD-10-CM | POA: Diagnosis not present

## 2016-10-02 DIAGNOSIS — R0981 Nasal congestion: Secondary | ICD-10-CM | POA: Insufficient documentation

## 2016-10-02 DIAGNOSIS — I10 Essential (primary) hypertension: Secondary | ICD-10-CM | POA: Insufficient documentation

## 2016-10-02 NOTE — ED Triage Notes (Addendum)
Had "head congestion ' x 2 weeks ago and felt better this past week and then last night has had a dry, hacking cough with rib pain. Hx of PE

## 2016-10-03 ENCOUNTER — Emergency Department (HOSPITAL_BASED_OUTPATIENT_CLINIC_OR_DEPARTMENT_OTHER)
Admission: EM | Admit: 2016-10-03 | Discharge: 2016-10-03 | Disposition: A | Discharge status: Home / Self Care | Payer: 59 | Attending: Emergency Medicine | Admitting: Emergency Medicine

## 2016-10-03 DIAGNOSIS — R69 Illness, unspecified: Secondary | ICD-10-CM

## 2016-10-03 DIAGNOSIS — J111 Influenza due to unidentified influenza virus with other respiratory manifestations: Secondary | ICD-10-CM

## 2016-10-03 MED ORDER — BENZONATATE 100 MG PO CAPS: 100.0000 mg | ORAL_CAPSULE | Freq: Three times a day (TID) | ORAL | 0 refills | Status: DC

## 2016-10-03 NOTE — ED Notes (Signed)
Cough since Friday, fever, body aches. Other family members sick as well.

## 2016-10-03 NOTE — ED Provider Notes (Signed)
[facility] DEPT [facility] Provider Note   CSN: [id number] Arrival date & time: 10/02/16  2244     History   Chief Complaint Chief Complaint  Patient presents with  . Cough    HPI Pamela Davenport is a 61 y.o. female.  89 yoF with cough congestion fevers chills myalgias. Going on since yesterday. Patient having some mild worsening of symptoms. Difficulty tolerating by mouth at home. Unsure of sick contacts.   The history is provided by the patient.  Cough  Associated symptoms include chills, headaches and myalgias. Pertinent negatives include no chest pain, no rhinorrhea, no shortness of breath, no wheezing and no eye redness.  Illness  This is a new problem. The current episode started yesterday. The problem occurs constantly. The problem has not changed since onset.Associated symptoms include headaches. Pertinent negatives include no chest pain and no shortness of breath. Nothing aggravates the symptoms. Nothing relieves the symptoms. She has tried nothing for the symptoms. The treatment provided no relief.    Past Medical History:  Diagnosis Date  . Atypical chest pain    normal myoview 11/11/10  . Blood transfusion without reported diagnosis   . DVT (deep venous thrombosis) (HCC)    recurrent; chronically anticoagulated with coumadin  . Family history of anesthesia complication    Sister had decrease in respirations after surgery  . Heart murmur   . Herniated lumbar intervertebral disc   . Hypertension   . Paroxysmal atrial fibrillation (HCC)   . PE (pulmonary embolism)    chronically anticoagulated with coumadin  . Pneumonia 07/20/12  . PONV (postoperative nausea and vomiting)   . Sickle cell trait ([place])   . Strep throat at age 26   The patient reports a severe strep throat infection at age 41 and   is not clear as to whether or not she may have had rheumatic fever.  . Symptomatic premature ventricular contractions    improved s/p ablation  . Urgency of urination     . Uterine bleeding 2008   Uterine artery embolization in 2008 for uterine bleeding.    Patient Active Problem List   Diagnosis Date Noted  . Shingles 06/07/2015  . Routine general medical examination at a health care facility 02/03/2015  . Essential hypertension 01/13/2015  . PULMONARY EMBOLISM 12/05/2008  . ATRIAL FIBRILLATION 12/05/2008  . PVC's (premature ventricular contractions) 12/05/2008  . DVT 12/05/2008    Past Surgical History:  Procedure Laterality Date  . COLONOSCOPY W/ POLYPECTOMY    . Diagnostic D&C hysteroscopy and Novasure ablation  03/17/2004  . DILATION AND CURETTAGE OF UTERUS  12/17/2011   Procedure: DILATATION AND CURETTAGE;  Surgeon: Frederico Hamman, MD;  Location: [facility];  Service: Gynecology;  Laterality: N/A;  With Attempted Hydrothermal Ablation  . LUMBAR LAMINECTOMY/DECOMPRESSION MICRODISCECTOMY Left 02/21/2013   Procedure: LUMBAR LAMINECTOMY/DECOMPRESSION MICRODISCECTOMY 1 LEVEL;  Surgeon: Winfield Cunas, MD;  Location: [facility];  Service: Neurosurgery;  Laterality: Left;  LEFT Lumbar Three-Four Laminotomy foraminotomy microdiskectomy  . MANDIBLE SURGERY     under bite  . PVC Ablation  2010  . SPINE SURGERY    . TONSILLECTOMY    . UTERINE ARTERY EMBOLIZATION  2008   for uterine bleeding  . VASCULAR SURGERY     laser of left leg    OB History    No data available       Home Medications    Prior to Admission medications   Medication Sig Start Date End Date Taking? Authorizing  Provider  hydrOXYzine (ATARAX/VISTARIL) 25 MG tablet Take 1 tablet (25 mg total) by mouth 3 (three) times daily as needed for itching. 05/20/16  Yes Shelly Bombard, MD  losartan (COZAAR) 25 MG tablet Take 1 tablet (25 mg total) by mouth daily. 02/13/16  Yes Thompson Grayer, MD  nadolol (CORGARD) 20 MG tablet Take 1 tablet (20 mg total) by mouth daily. 02/13/16  Yes Thompson Grayer, MD  rivaroxaban (XARELTO) 20 MG TABS tablet Take 1 tablet (20 mg total) by mouth daily with  supper. 02/13/16  Yes Thompson Grayer, MD  benzonatate (TESSALON) 100 MG capsule Take 1 capsule (100 mg total) by mouth every 8 (eight) hours. 10/03/16   Deno Etienne, DO  clindamycin (CLEOCIN) 300 MG capsule Take 1 capsule (300 mg total) by mouth 3 (three) times daily. 03/11/16   Shelly Bombard, MD  flecainide [facility]) 150 MG tablet Take 2 tablets by mouth at the onset of AFIB 02/13/16   Thompson Grayer, MD    Family History Family History  Problem Relation Age of Onset  . Hypertension Mother   . Diabetes Mother     Social History Social History  Substance Use Topics  . Smoking status: Former Research scientist (life sciences)  . Smokeless tobacco: Never Used  . Alcohol use Yes     Comment: rarely     Allergies   Methylprednisolone; Sulfa antibiotics; and Sulfonamide derivatives   Review of Systems Review of Systems  Constitutional: Positive for chills and fever.  HENT: Positive for congestion. Negative for rhinorrhea.   Eyes: Negative for redness and visual disturbance.  Respiratory: Positive for cough. Negative for shortness of breath and wheezing.   Cardiovascular: Negative for chest pain and palpitations.  Gastrointestinal: Negative for nausea and vomiting.  Genitourinary: Negative for dysuria and urgency.  Musculoskeletal: Positive for myalgias. Negative for arthralgias.  Skin: Negative for pallor and wound.  Neurological: Positive for headaches. Negative for dizziness.     Physical Exam Updated Vital Signs BP 133/66 (BP Location: Right Arm)   Pulse 94   Temp 98.7 F (37.1 C) (Oral)   Resp 22   Ht 5\' 9"  (1.753 m)   Wt 249 lb (112.9 kg)   SpO2 100%   BMI 36.77 kg/m   Physical Exam  Constitutional: She is oriented to person, place, and time. She appears well-developed and well-nourished. No distress.  HENT:  Head: Normocephalic and atraumatic.  Swollen turbinates, posterior nasal drip, no noted sinus ttp, tm normal bilaterally.    Eyes: EOM are normal. Pupils are equal, round, and reactive  to light.  Neck: Normal range of motion. Neck supple.  Cardiovascular: Normal rate and regular rhythm.  Exam reveals no gallop and no friction rub.   No murmur heard. Pulmonary/Chest: Effort normal. She has no wheezes. She has no rales.  Abdominal: Soft. She exhibits no distension. There is no tenderness.  Musculoskeletal: She exhibits no edema or tenderness.  Neurological: She is alert and oriented to person, place, and time.  Skin: Skin is warm and dry. She is not diaphoretic.  Psychiatric: She has a normal mood and affect. Her behavior is normal.  Nursing note and vitals reviewed.    ED Treatments / Results  Labs (all labs ordered are listed, but only abnormal results are displayed) Labs Reviewed - No data to display  EKG  EKG Interpretation None       Radiology Dg Chest 2 View  Result Date: 10/02/2016 CLINICAL DATA:  Congestion for 2 weeks. Nonproductive cough, onset last night.  EXAM: CHEST  2 VIEW COMPARISON:  02/13/2013 FINDINGS: Mild hyperinflation and borderline cardiomegaly are unchanged. No consolidation. No effusions. Normal pulmonary vasculature. Unremarkable hilar and mediastinal contours. IMPRESSION: Mild hyperinflation and borderline cardiomegaly, unchanged. No consolidation or effusion. Electronically Signed   By: Andreas Newport M.D.   On: 10/02/2016 23:18    Procedures Procedures (including critical care time)  Medications Ordered in ED Medications - No data to display   Initial Impression / Assessment and Plan / ED Course  I have reviewed the triage vital signs and the nursing notes.  Pertinent labs & imaging results that were available during my care of the patient were reviewed by me and considered in my medical decision making (see chart for details).     60 yo F With an influenza-like illness. No bacterial source found on my exam. Will treat symptomatically. Discharge home.  :  I have discussed the diagnosis/risks/treatment options with the  patient and believe the pt to be eligible for discharge home to follow-up with PCP. We also discussed returning to the ED immediately if new or worsening sx occur. We discussed the sx which are most concerning (e.g., sudden worsening pain, fever, inability to tolerate by mouth) that necessitate immediate return. Medications administered to the patient during their visit and any new prescriptions provided to the patient are listed below.  Medications given during this visit Medications - No data to display   The patient appears reasonably screen and/or stabilized for discharge and I doubt any other medical condition or other [facility] requiring further screening, evaluation, or treatment in the ED at this time prior to discharge.    Final Clinical Impressions(s) / ED Diagnoses   Final diagnoses:  Influenza-like illness    New Prescriptions Discharge Medication List as of 10/03/2016  3:06 AM    START taking these medications   Details  benzonatate (TESSALON) 100 MG capsule Take 1 capsule (100 mg total) by mouth every 8 (eight) hours., Starting Sun 10/03/2016, [place], DO 10/03/16 [phone]

## 2016-10-03 NOTE — Discharge Instructions (Signed)
Take tylenol 2 pills 4 times a day and motrin 4 pills 3 times a day.  Drink plenty of fluids.  Return for worsening shortness of breath, headache, confusion. Follow up with your family doctor.   

## 2016-10-03 NOTE — ED Notes (Signed)
Pt given d/c instructions as per chart. Verbalizes understanding. No questions. Rx x 1 

## 2016-10-03 NOTE — ED Notes (Signed)
ED Provider at bedside. 

## 2016-10-19 ENCOUNTER — Other Ambulatory Visit (INDEPENDENT_AMBULATORY_CARE_PROVIDER_SITE_OTHER): Payer: 59

## 2016-10-19 ENCOUNTER — Ambulatory Visit (INDEPENDENT_AMBULATORY_CARE_PROVIDER_SITE_OTHER): Payer: 59 | Admitting: Internal Medicine

## 2016-10-19 ENCOUNTER — Encounter: Payer: Self-pay | Admitting: Internal Medicine

## 2016-10-19 VITALS — BP 128/76 | HR 77 | Temp 99.4°F | Resp 16 | Ht 69.0 in | Wt 246.1 lb

## 2016-10-19 DIAGNOSIS — I10 Essential (primary) hypertension: Secondary | ICD-10-CM

## 2016-10-19 DIAGNOSIS — I480 Paroxysmal atrial fibrillation: Secondary | ICD-10-CM | POA: Diagnosis not present

## 2016-10-19 DIAGNOSIS — E785 Hyperlipidemia, unspecified: Secondary | ICD-10-CM | POA: Diagnosis not present

## 2016-10-19 LAB — CBC WITH DIFFERENTIAL/PLATELET
Basophils Absolute: 0 10*3/uL (ref 0.0–0.1)
Basophils Relative: 0.5 % (ref 0.0–3.0)
Eosinophils Absolute: 0.1 10*3/uL (ref 0.0–0.7)
Eosinophils Relative: 1.9 % (ref 0.0–5.0)
HCT: 38.2 % (ref 36.0–46.0)
Hemoglobin: 12.4 g/dL (ref 12.0–15.0)
Lymphocytes Relative: 23.2 % (ref 12.0–46.0)
Lymphs Abs: 1.4 10*3/uL (ref 0.7–4.0)
MCHC: 32.4 g/dL (ref 30.0–36.0)
MCV: 80.8 fl (ref 78.0–100.0)
Monocytes Absolute: 0.5 10*3/uL (ref 0.1–1.0)
Monocytes Relative: 8.5 % (ref 3.0–12.0)
Neutro Abs: 3.9 10*3/uL (ref 1.4–7.7)
Neutrophils Relative %: 65.9 % (ref 43.0–77.0)
Platelets: 311 10*3/uL (ref 150.0–400.0)
RBC: 4.73 Mil/uL (ref 3.87–5.11)
RDW: 14.2 % (ref 11.5–15.5)
WBC: 5.9 10*3/uL (ref 4.0–10.5)

## 2016-10-19 LAB — COMPREHENSIVE METABOLIC PANEL
ALT: 11 U/L (ref 0–35)
AST: 16 U/L (ref 0–37)
Albumin: 4.1 g/dL (ref 3.5–5.2)
Alkaline Phosphatase: 66 U/L (ref 39–117)
BUN: 12 mg/dL (ref 6–23)
CO2: 28 mEq/L (ref 19–32)
Calcium: 9.6 mg/dL (ref 8.4–10.5)
Chloride: 106 mEq/L (ref 96–112)
Creatinine, Ser: 0.8 mg/dL (ref 0.40–1.20)
GFR: 93.93 mL/min (ref >60.00)
Glucose, Bld: 94 mg/dL (ref 70–99)
Potassium: 4.3 mEq/L (ref 3.5–5.1)
Sodium: 138 mEq/L (ref 135–145)
Total Bilirubin: 0.6 mg/dL (ref 0.2–1.2)
Total Protein: 7.3 g/dL (ref 6.0–8.3)

## 2016-10-19 LAB — LIPID PANEL
Cholesterol: 156 mg/dL (ref 0–200)
HDL: 38.1 mg/dL — ABNORMAL LOW (ref >39.00)
LDL Cholesterol: 105 mg/dL — ABNORMAL HIGH (ref 0–99)
NonHDL: 118.34
Total CHOL/HDL Ratio: 4
Triglycerides: 69 mg/dL (ref 0.0–149.0)
VLDL: 13.8 mg/dL (ref 0.0–40.0)

## 2016-10-19 LAB — TSH: TSH: 1.2 u[IU]/mL (ref 0.35–4.50)

## 2016-10-19 NOTE — Progress Notes (Signed)
Subjective:  Patient ID: Pamela Davenport, female    DOB: Nov 16, 1955  Age: 61 y.o. MRN: [id number]  CC: Hypertension   HPI Pamela Davenport presents for a BP check - She was recently seen in the ER for viral URI but she tells me all of those symptoms have resolved. She feels well today and offers no complaints. She tells me her blood pressure has been well controlled.  Outpatient Medications Prior to Visit  Medication Sig Dispense Refill  . flecainide (TAMBOCOR) 150 MG tablet Take 2 tablets by mouth at the onset of AFIB 30 tablet 3  . hydrOXYzine (ATARAX/VISTARIL) 25 MG tablet Take 1 tablet (25 mg total) by mouth 3 (three) times daily as needed for itching. 30 tablet 2  . losartan (COZAAR) 25 MG tablet Take 1 tablet (25 mg total) by mouth daily. 90 tablet 3  . nadolol (CORGARD) 20 MG tablet Take 1 tablet (20 mg total) by mouth daily. 90 tablet 3  . rivaroxaban (XARELTO) 20 MG TABS tablet Take 1 tablet (20 mg total) by mouth daily with supper. 90 tablet 3  . benzonatate (TESSALON) 100 MG capsule Take 1 capsule (100 mg total) by mouth every 8 (eight) hours. (Patient not taking: Reported on 10/19/2016) 21 capsule 0  . clindamycin (CLEOCIN) 300 MG capsule Take 1 capsule (300 mg total) by mouth 3 (three) times daily. (Patient not taking: Reported on 10/19/2016) 30 capsule 1   No facility-administered medications prior to visit.     ROS Review of Systems  Constitutional: Negative for chills, diaphoresis, fatigue and unexpected weight change.  HENT: Negative.   Eyes: Negative for visual disturbance.  Respiratory: Negative for cough, chest tightness, shortness of breath and wheezing.   Cardiovascular: Negative.  Negative for chest pain, palpitations and leg swelling.  Gastrointestinal: Negative for abdominal pain, diarrhea, nausea and vomiting.  Endocrine: Negative.   Genitourinary: Negative.  Negative for difficulty urinating.  Musculoskeletal: Negative.  Negative for arthralgias, back pain,  myalgias and neck pain.  Skin: Negative.   Allergic/Immunologic: Negative.   Neurological: Negative.  Negative for dizziness, weakness, light-headedness, numbness and headaches.  Hematological: Negative for adenopathy. Does not bruise/bleed easily.  Psychiatric/Behavioral: Negative.     Objective:  BP 128/76 (BP Location: Left Arm, Patient Position: Sitting, Cuff Size: Normal)   Pulse 77   Temp 99.4 F (37.4 C) (Oral)   Resp 16   Ht 5\' 9"  (1.753 m)   Wt 246 lb 1.9 oz (111.6 kg)   SpO2 98%   BMI 36.35 kg/m   BP Readings from Last 3 Encounters:  10/19/16 128/76  10/03/16 133/66  08/17/16 130/86    Wt Readings from Last 3 Encounters:  10/19/16 246 lb 1.9 oz (111.6 kg)  10/02/16 249 lb (112.9 kg)  02/13/16 248 lb 9.6 oz (112.8 kg)    Physical Exam  Constitutional: She is oriented to person, place, and time. No distress.  HENT:  Mouth/Throat: Oropharynx is clear and moist. No oropharyngeal exudate.  Eyes: Conjunctivae are normal. Right eye exhibits no discharge. Left eye exhibits no discharge. No scleral icterus.  Neck: Normal range of motion. Neck supple. No JVD present. No tracheal deviation present. No thyromegaly present.  Cardiovascular: Normal rate, regular rhythm, normal heart sounds and intact distal pulses.  Exam reveals no gallop and no friction rub.   No murmur heard. Pulmonary/Chest: Effort normal and breath sounds normal. No stridor. No respiratory distress. She has no wheezes. She has no rales. She exhibits no tenderness.  Abdominal: Soft. Bowel sounds are normal. She exhibits no distension and no mass. There is no tenderness. There is no rebound and no guarding.  Musculoskeletal: She exhibits no edema or tenderness.  Lymphadenopathy:    She has no cervical adenopathy.  Neurological: She is oriented to person, place, and time.  Skin: Skin is warm and dry. No rash noted. She is not diaphoretic. No erythema. No pallor.  Vitals reviewed.   Lab Results    Component Value Date   WBC 5.9 10/19/2016   HGB 12.4 10/19/2016   HCT 38.2 10/19/2016   PLT 311.0 10/19/2016   GLUCOSE 94 10/19/2016   CHOL 156 10/19/2016   TRIG 69.0 10/19/2016   HDL 38.10 (L) 10/19/2016   LDLCALC 105 (H) 10/19/2016   ALT 11 10/19/2016   AST 16 10/19/2016   NA 138 10/19/2016   K 4.3 10/19/2016   CL 106 10/19/2016   CREATININE 0.80 10/19/2016   BUN 12 10/19/2016   CO2 28 10/19/2016   TSH 1.20 10/19/2016   INR 3.1 04/26/2013    No results found.  Assessment & Plan:   Pamela Davenport was seen today for hypertension.  Diagnoses and all orders for this visit:  Essential hypertension- her BP is adequately well controlled,, lytes and renal fxn are normal -     Comprehensive metabolic panel; Future -     CBC with Differential/Platelet; Future  Paroxysmal atrial fibrillation ([place])- she has good rate and rhythm control, will continue anticoagulation -     TSH; Future  Hyperlipidemia with target LDL less than 130- she has achieved her LDL goal and is doing well on the statin -     Lipid panel; Future -     TSH; Future   I have discontinued Pamela Davenport's clindamycin and benzonatate. I am also having her maintain her flecainide, nadolol, rivaroxaban, losartan, and hydrOXYzine.  No orders of the defined types were placed in this encounter.    Follow-up: Return in about 6 months (around 04/18/2017).  Scarlette Calico, MD

## 2016-10-19 NOTE — Progress Notes (Signed)
Pre visit review using our clinic review tool, if applicable. No additional management support is needed unless otherwise documented below in the visit note. 

## 2016-10-19 NOTE — Patient Instructions (Signed)
Hypertension Hypertension, commonly called high blood pressure, is when the force of blood pumping through your arteries is too strong. Your arteries are the blood vessels that carry blood from your heart throughout your body. A blood pressure reading consists of a higher number over a lower number, such as 110/72. The higher number (systolic) is the pressure inside your arteries when your heart pumps. The lower number (diastolic) is the pressure inside your arteries when your heart relaxes. Ideally you want your blood pressure below 120/80. Hypertension forces your heart to work harder to pump blood. Your arteries may become narrow or stiff. Having untreated or uncontrolled hypertension can cause heart attack, stroke, kidney disease, and other problems. What increases the risk? Some risk factors for high blood pressure are controllable. Others are not. Risk factors you cannot control include:  Race. You may be at higher risk if you are African American.  Age. Risk increases with age.  Gender. Men are at higher risk than women before age 45 years. After age 65, women are at higher risk than men. Risk factors you can control include:  Not getting enough exercise or physical activity.  Being overweight.  Getting too much fat, sugar, calories, or salt in your diet.  Drinking too much alcohol. What are the signs or symptoms? Hypertension does not usually cause signs or symptoms. Extremely high blood pressure (hypertensive crisis) may cause headache, anxiety, shortness of breath, and nosebleed. How is this diagnosed? To check if you have hypertension, your health care provider will measure your blood pressure while you are seated, with your arm held at the level of your heart. It should be measured at least twice using the same arm. Certain conditions can cause a difference in blood pressure between your right and left arms. A blood pressure reading that is higher than normal on one occasion does  not mean that you need treatment. If it is not clear whether you have high blood pressure, you may be asked to return on a different day to have your blood pressure checked again. Or, you may be asked to monitor your blood pressure at home for 1 or more weeks. How is this treated? Treating high blood pressure includes making lifestyle changes and possibly taking medicine. Living a healthy lifestyle can help lower high blood pressure. You may need to change some of your habits. Lifestyle changes may include:  Following the DASH diet. This diet is high in fruits, vegetables, and whole grains. It is low in salt, red meat, and added sugars.  Keep your sodium intake below 2,300 mg per day.  Getting at least 30-45 minutes of aerobic exercise at least 4 times per week.  Losing weight if necessary.  Not smoking.  Limiting alcoholic beverages.  Learning ways to reduce stress. Your health care provider may prescribe medicine if lifestyle changes are not enough to get your blood pressure under control, and if one of the following is true:  You are 18-59 years of age and your systolic blood pressure is above 140.  You are 60 years of age or older, and your systolic blood pressure is above 150.  Your diastolic blood pressure is above 90.  You have diabetes, and your systolic blood pressure is over 140 or your diastolic blood pressure is over 90.  You have kidney disease and your blood pressure is above 140/90.  You have heart disease and your blood pressure is above 140/90. Your personal target blood pressure may vary depending on your medical   conditions, your age, and other factors. Follow these instructions at home:  Have your blood pressure rechecked as directed by your health care provider.  Take medicines only as directed by your health care provider. Follow the directions carefully. Blood pressure medicines must be taken as prescribed. The medicine does not work as well when you skip  doses. Skipping doses also puts you at risk for problems.  Do not smoke.  Monitor your blood pressure at home as directed by your health care provider. Contact a health care provider if:  You think you are having a reaction to medicines taken.  You have recurrent headaches or feel dizzy.  You have swelling in your ankles.  You have trouble with your vision. Get help right away if:  You develop a severe headache or confusion.  You have unusual weakness, numbness, or feel faint.  You have severe chest or abdominal pain.  You vomit repeatedly.  You have trouble breathing. This information is not intended to replace advice given to you by your health care provider. Make sure you discuss any questions you have with your health care provider. Document Released: 08/16/2005 Document Revised: 01/22/2016 Document Reviewed: 06/08/2013 Elsevier Interactive Patient Education  2017 [employer]  

## 2016-10-20 ENCOUNTER — Encounter: Payer: Self-pay | Admitting: Internal Medicine

## 2016-10-26 ENCOUNTER — Other Ambulatory Visit: Payer: Self-pay | Admitting: Internal Medicine

## 2016-10-26 DIAGNOSIS — Z1231 Encounter for screening mammogram for malignant neoplasm of breast: Secondary | ICD-10-CM

## 2016-11-03 MED FILL — XARELTO 20 MG TABLET: 20 | 90 days supply | Qty: 90 | Fill #2

## 2016-11-10 ENCOUNTER — Ambulatory Visit: Payer: 59

## 2016-11-16 DIAGNOSIS — H524 Presbyopia: Secondary | ICD-10-CM | POA: Diagnosis not present

## 2016-11-16 DIAGNOSIS — H5213 Myopia, bilateral: Secondary | ICD-10-CM | POA: Diagnosis not present

## 2016-11-24 MED FILL — OLOPATADINE HCL 0.2 % SOLN: 0.2 | 30 days supply | Qty: 3 | Fill #0

## 2016-11-26 ENCOUNTER — Inpatient Hospital Stay: Admission: RE | Admit: 2016-11-26 | Payer: 59 | Source: Ambulatory Visit

## 2016-11-29 MED FILL — NADOLOL 20 MG TABLET: 20 | 90 days supply | Qty: 90 | Fill #3

## 2016-11-29 MED FILL — LOSARTAN POTASSIUM 25 MG TA: 25 | 90 days supply | Qty: 90 | Fill #3

## 2017-02-03 MED FILL — hydrOXYzine HCL 25 MG TABS: 25 | 10 days supply | Qty: 30 | Fill #1

## 2017-02-03 MED FILL — XARELTO 20 MG TABLET: 20 | 90 days supply | Qty: 90 | Fill #3

## 2017-03-01 ENCOUNTER — Other Ambulatory Visit: Payer: Self-pay | Admitting: Internal Medicine

## 2017-03-01 MED FILL — LOSARTAN POTASSIUM 25 MG TA: 25 | 90 days supply | Qty: 90 | Fill #0

## 2017-03-01 MED FILL — NADOLOL 20 MG TAB: 20 | 90 days supply | Qty: 90 | Fill #0

## 2017-03-30 ENCOUNTER — Encounter: Payer: Self-pay | Admitting: Internal Medicine

## 2017-03-30 ENCOUNTER — Ambulatory Visit (INDEPENDENT_AMBULATORY_CARE_PROVIDER_SITE_OTHER): Payer: 59 | Admitting: Internal Medicine

## 2017-03-30 VITALS — BP 130/82 | HR 72 | Ht 69.0 in | Wt 254.0 lb

## 2017-03-30 DIAGNOSIS — I10 Essential (primary) hypertension: Secondary | ICD-10-CM | POA: Diagnosis not present

## 2017-03-30 DIAGNOSIS — I493 Ventricular premature depolarization: Secondary | ICD-10-CM

## 2017-03-30 DIAGNOSIS — I480 Paroxysmal atrial fibrillation: Secondary | ICD-10-CM | POA: Diagnosis not present

## 2017-03-30 LAB — BASIC METABOLIC PANEL
BUN/Creatinine Ratio: 15 (ref 12–28)
BUN: 12 mg/dL (ref 8–27)
CO2: 23 mmol/L (ref 20–29)
Calcium: 9.2 mg/dL (ref 8.7–10.3)
Chloride: 101 mmol/L (ref 96–106)
Creatinine, Ser: 0.8 mg/dL (ref 0.57–1.00)
GFR calc Af Amer: 93 mL/min/{1.73_m2} (ref >59)
GFR calc non Af Amer: 80 mL/min/{1.73_m2} (ref >59)
Glucose: 98 mg/dL (ref 65–99)
Potassium: 4.6 mmol/L (ref 3.5–5.2)
Sodium: 141 mmol/L (ref 134–144)

## 2017-03-30 LAB — CBC WITH DIFFERENTIAL/PLATELET
Basophils Absolute: 0 10*3/uL (ref 0.0–0.2)
Basos: 1 %
EOS (ABSOLUTE): 0.1 10*3/uL (ref 0.0–0.4)
Eos: 2 %
Hematocrit: 36.6 % (ref 34.0–46.6)
Hemoglobin: 12.4 g/dL (ref 11.1–15.9)
Immature Grans (Abs): 0 10*3/uL (ref 0.0–0.1)
Immature Granulocytes: 0 %
Lymphocytes Absolute: 1.6 10*3/uL (ref 0.7–3.1)
Lymphs: 26 %
MCH: 27 pg (ref 26.6–33.0)
MCHC: 33.9 g/dL (ref 31.5–35.7)
MCV: 80 fL (ref 79–97)
Monocytes Absolute: 0.3 10*3/uL (ref 0.1–0.9)
Monocytes: 5 %
Neutrophils Absolute: 4.1 10*3/uL (ref 1.4–7.0)
Neutrophils: 66 %
Platelets: 258 10*3/uL (ref 150–379)
RBC: 4.6 x10E6/uL (ref 3.77–5.28)
RDW: 13.2 % (ref 12.3–15.4)
WBC: 6.2 10*3/uL (ref 3.4–10.8)

## 2017-03-30 LAB — MAGNESIUM: Magnesium: 2.5 mg/dL — ABNORMAL HIGH (ref 1.6–2.3)

## 2017-03-30 NOTE — Patient Instructions (Signed)
Medication Instructions:  Your physician recommends that you continue on your current medications as directed. Please refer to the Current Medication list given to you today.   Labwork: TODAY - BMET, CBC, Magnesium   Testing/Procedures: None Ordered   Follow-Up: Your physician wants you to follow-up in: 1 year with Dr. Rayann Heman.  You will receive a reminder letter in the mail two months in advance. If you don't receive a letter, please call our office to schedule the follow-up appointment.   If you need a refill on your cardiac medications before your next appointment, please call your pharmacy.   Thank you for choosing [facility]! Christen Bame, RN [phone]

## 2017-03-30 NOTE — Progress Notes (Signed)
PCP: Janith Lima, MD   Pamela Davenport is a 61 y.o. female who presents today for routine electrophysiology followup.  Since last being seen in our clinic, the patient reports doing very well.  She had some palpitations earlier this year in the setting of influenza but has otherwise done well.  no recent afib.  Today, she denies symptoms of palpitations, chest pain, shortness of breath,  lower extremity edema, dizziness, presyncope, or syncope.  The patient is otherwise without complaint today.   Past Medical History:  Diagnosis Date  . Atypical chest pain    normal myoview 11/11/10  . Blood transfusion without reported diagnosis   . DVT (deep venous thrombosis) (HCC)    recurrent; chronically anticoagulated with coumadin  . Family history of anesthesia complication    Sister had decrease in respirations after surgery  . Heart murmur   . Herniated lumbar intervertebral disc   . Hypertension   . Paroxysmal atrial fibrillation (HCC)   . PE (pulmonary embolism)    chronically anticoagulated with coumadin  . Pneumonia 07/20/12  . PONV (postoperative nausea and vomiting)   . Sickle cell trait ([place])   . Strep throat at age 28   The patient reports a severe strep throat infection at age 43 and   is not clear as to whether or not she may have had rheumatic fever.  . Symptomatic premature ventricular contractions    improved s/p ablation  . Urgency of urination   . Uterine bleeding 2008   Uterine artery embolization in 2008 for uterine bleeding.   Past Surgical History:  Procedure Laterality Date  . COLONOSCOPY W/ POLYPECTOMY    . Diagnostic D&C hysteroscopy and Novasure ablation  03/17/2004  . DILATION AND CURETTAGE OF UTERUS  12/17/2011   Procedure: DILATATION AND CURETTAGE;  Surgeon: Frederico Hamman, MD;  Location: [facility];  Service: Gynecology;  Laterality: N/A;  With Attempted Hydrothermal Ablation  . LUMBAR LAMINECTOMY/DECOMPRESSION MICRODISCECTOMY Left 02/21/2013   Procedure: LUMBAR LAMINECTOMY/DECOMPRESSION MICRODISCECTOMY 1 LEVEL;  Surgeon: Winfield Cunas, MD;  Location: [facility];  Service: Neurosurgery;  Laterality: Left;  LEFT Lumbar Three-Four Laminotomy foraminotomy microdiskectomy  . MANDIBLE SURGERY     under bite  . PVC Ablation  2010  . SPINE SURGERY    . TONSILLECTOMY    . UTERINE ARTERY EMBOLIZATION  2008   for uterine bleeding  . VASCULAR SURGERY     laser of left leg    ROS- all systems are reviewed and negatives except as per HPI above  Current Outpatient Prescriptions  Medication Sig Dispense Refill  . flecainide (TAMBOCOR) 150 MG tablet Take 2 tablets by mouth at the onset of AFIB 30 tablet 3  . hydrOXYzine (ATARAX/VISTARIL) 25 MG tablet Take 1 tablet (25 mg total) by mouth 3 (three) times daily as needed for itching. 30 tablet 2  . losartan (COZAAR) 25 MG tablet TAKE 1 TABLET BY MOUTH ONCE DAILY 90 tablet 3  . nadolol (CORGARD) 20 MG tablet TAKE 1 TABLET BY MOUTH ONCE DAILY 90 tablet 3  . rivaroxaban (XARELTO) 20 MG TABS tablet Take 1 tablet (20 mg total) by mouth daily with supper. 90 tablet 3   No current facility-administered medications for this visit.     Physical Exam: Vitals:   03/30/17 0903  BP: 130/82  Pulse: 72  Weight: 254 lb (115.2 kg)  Height: 5\' 9"  (1.753 m)    GEN- The patient is well appearing, alert and oriented x 3  today.   Head- normocephalic, atraumatic Eyes-  Sclera clear, conjunctiva pink Ears- hearing intact Oropharynx- clear Lungs- Clear to ausculation bilaterally, normal work of breathing Heart- Regular rate and rhythm, no murmurs, rubs or gallops, PMI not laterally displaced GI- soft, NT, ND, + BS Extremities- no clubbing, cyanosis, or edema  EKG tracing ordered today is personally reviewed and shows sinus rhythm 74 bpm, otherwise normal ekg  Labs from Dr Ronnald Ramp is reviewed  Assessment and Plan:  1. Hypertension Stable No change required today  2. PVCs Stable No change  required today Bmet, mg today  3. afib Well controlled On xarelto given prior recurrent DVT and PTE,  chads2vasc score is 2 Bmet,cbc today  No changes today Return in 1 year  Thompson Grayer MD, [facility] 03/30/2017 9:33 AM

## 2017-04-01 ENCOUNTER — Other Ambulatory Visit: Payer: Self-pay | Admitting: Internal Medicine

## 2017-04-06 ENCOUNTER — Other Ambulatory Visit: Payer: Self-pay | Admitting: Obstetrics

## 2017-04-06 DIAGNOSIS — L0102 Bockhart's impetigo: Secondary | ICD-10-CM

## 2017-04-06 DIAGNOSIS — B3731 Acute candidiasis of vulva and vagina: Secondary | ICD-10-CM

## 2017-04-06 DIAGNOSIS — B373 Candidiasis of vulva and vagina: Secondary | ICD-10-CM

## 2017-04-06 MED ORDER — FLUCONAZOLE 150 MG PO TABS: 150.0000 mg | ORAL_TABLET | Freq: Once | ORAL | 2 refills | Status: AC

## 2017-04-06 MED ORDER — AMOXICILLIN-POT CLAVULANATE 875-125 MG PO TABS: 1.0000 | ORAL_TABLET | Freq: Two times a day (BID) | ORAL | 2 refills | Status: DC

## 2017-04-06 MED FILL — FLUCONAZOLE 150 MG TABLET: 150 | 1 days supply | Qty: 1 | Fill #0

## 2017-04-06 MED FILL — AMOX TR-K CLV 875-125 MG TA: 875-125 | 14 days supply | Qty: 28 | Fill #0

## 2017-04-18 ENCOUNTER — Ambulatory Visit (INDEPENDENT_AMBULATORY_CARE_PROVIDER_SITE_OTHER): Payer: 59 | Admitting: Internal Medicine

## 2017-04-18 ENCOUNTER — Encounter: Payer: Self-pay | Admitting: Internal Medicine

## 2017-04-18 VITALS — BP 134/90 | HR 82 | Temp 98.5°F | Resp 16 | Ht 69.0 in | Wt 254.0 lb

## 2017-04-18 DIAGNOSIS — I10 Essential (primary) hypertension: Secondary | ICD-10-CM | POA: Diagnosis not present

## 2017-04-18 DIAGNOSIS — Z1231 Encounter for screening mammogram for malignant neoplasm of breast: Secondary | ICD-10-CM

## 2017-04-18 DIAGNOSIS — Z124 Encounter for screening for malignant neoplasm of cervix: Secondary | ICD-10-CM | POA: Insufficient documentation

## 2017-04-18 NOTE — Progress Notes (Signed)
Subjective:  Patient ID: Pamela Davenport, female    DOB: 04/30/1956  Age: 61 y.o. MRN: [id number]  CC: Hypertension   HPI Pamela Davenport presents for a BP check - she feels well and offers no complaints.  Outpatient Medications Prior to Visit  Medication Sig Dispense Refill  . amoxicillin-clavulanate (AUGMENTIN) 875-125 MG tablet Take 1 tablet by mouth 2 (two) times daily. 28 tablet 2  . flecainide (TAMBOCOR) 150 MG tablet Take 2 tablets by mouth at the onset of AFIB 30 tablet 3  . hydrOXYzine (ATARAX/VISTARIL) 25 MG tablet Take 1 tablet (25 mg total) by mouth 3 (three) times daily as needed for itching. 30 tablet 2  . losartan (COZAAR) 25 MG tablet TAKE 1 TABLET BY MOUTH ONCE DAILY 90 tablet 3  . nadolol (CORGARD) 20 MG tablet TAKE 1 TABLET BY MOUTH ONCE DAILY 90 tablet 3  . rivaroxaban (XARELTO) 20 MG TABS tablet Take 1 tablet (20 mg total) by mouth daily with supper. 90 tablet 3   No facility-administered medications prior to visit.     ROS Review of Systems  Constitutional: Negative for activity change, chills, diaphoresis, fatigue and unexpected weight change.  HENT: Negative.   Eyes: Negative for visual disturbance.  Respiratory: Negative for apnea, cough, chest tightness, shortness of breath and wheezing.   Cardiovascular: Negative for chest pain, palpitations and leg swelling.  Gastrointestinal: Negative for abdominal pain, constipation, diarrhea, nausea and vomiting.  Endocrine: Negative.   Genitourinary: Negative.  Negative for difficulty urinating.  Musculoskeletal: Negative.   Skin: Negative.   Allergic/Immunologic: Negative.   Neurological: Negative.  Negative for dizziness and weakness.  Hematological: Negative for adenopathy. Does not bruise/bleed easily.  Psychiatric/Behavioral: Negative.     Objective:  BP 134/90 (BP Location: Left Arm, Patient Position: Sitting, Cuff Size: Large)   Pulse 82   Temp 98.5 F (36.9 C) (Oral)   Resp 16   Ht 5\' 9"  (1.753 m)    Wt 254 lb (115.2 kg)   SpO2 98%   BMI 37.51 kg/m   BP Readings from Last 3 Encounters:  04/18/17 134/90  03/30/17 130/82  10/19/16 128/76    Wt Readings from Last 3 Encounters:  04/18/17 254 lb (115.2 kg)  03/30/17 254 lb (115.2 kg)  10/19/16 246 lb 1.9 oz (111.6 kg)    Physical Exam  Constitutional: She is oriented to person, place, and time. No distress.  HENT:  Mouth/Throat: Oropharynx is clear and moist. No oropharyngeal exudate.  Eyes: Conjunctivae are normal. Right eye exhibits no discharge. Left eye exhibits no discharge. No scleral icterus.  Neck: Normal range of motion. Neck supple. No JVD present. No thyromegaly present.  Cardiovascular: Normal rate, regular rhythm and intact distal pulses.  Exam reveals no gallop and no friction rub.   No murmur heard. Pulmonary/Chest: Effort normal and breath sounds normal. No respiratory distress. She has no wheezes. She has no rales. She exhibits no tenderness.  Abdominal: Soft. Bowel sounds are normal. She exhibits no distension and no mass. There is no tenderness. There is no rebound and no guarding.  Musculoskeletal: Normal range of motion. She exhibits no edema, tenderness or deformity.  Lymphadenopathy:    She has no cervical adenopathy.  Neurological: She is alert and oriented to person, place, and time.  Skin: Skin is warm and dry. No rash noted. She is not diaphoretic. No erythema. No pallor.  Vitals reviewed.   Lab Results  Component Value Date   WBC 6.2 03/30/2017  HGB 12.4 03/30/2017   HCT 36.6 03/30/2017   PLT 258 03/30/2017   GLUCOSE 98 03/30/2017   CHOL 156 10/19/2016   TRIG 69.0 10/19/2016   HDL 38.10 (L) 10/19/2016   LDLCALC 105 (H) 10/19/2016   ALT 11 10/19/2016   AST 16 10/19/2016   NA 141 03/30/2017   K 4.6 03/30/2017   CL 101 03/30/2017   CREATININE 0.80 03/30/2017   BUN 12 03/30/2017   CO2 23 03/30/2017   TSH 1.20 10/19/2016   INR 3.1 04/26/2013    No results found.  Assessment & Plan:    Chayse was seen today for hypertension.  Diagnoses and all orders for this visit:  Essential hypertension- her BP is well controlled, recent lytes and renal fxn were normal  Visit for screening mammogram -     MM DIGITAL SCREENING BILATERAL; Future  Cervical cancer screening -     Ambulatory referral to Gynecology   I am having Ms. Thurow maintain her flecainide, rivaroxaban, hydrOXYzine, losartan, nadolol, and amoxicillin-clavulanate.  No orders of the defined types were placed in this encounter.    Follow-up: Return in about 6 months (around 10/19/2017).  Pamela Calico, MD

## 2017-04-18 NOTE — Patient Instructions (Signed)

## 2017-04-21 MED FILL — hydrOXYzine HCL 25 MG TABS: 25 | 10 days supply | Qty: 30 | Fill #2

## 2017-05-16 ENCOUNTER — Other Ambulatory Visit: Payer: Self-pay | Admitting: Internal Medicine

## 2017-05-16 MED FILL — XARELTO 20 MG TABLET: 20 | 90 days supply | Qty: 90 | Fill #0

## 2017-05-16 NOTE — Telephone Encounter (Signed)
Patient left a msg on the refill vm stating that she has been without this medication for two days and would like to be able to pick up the rx today so that she does not miss anymore doses. Thanks, MI

## 2017-05-19 DIAGNOSIS — Z1151 Encounter for screening for human papillomavirus (HPV): Secondary | ICD-10-CM | POA: Diagnosis not present

## 2017-05-19 DIAGNOSIS — Z01419 Encounter for gynecological examination (general) (routine) without abnormal findings: Secondary | ICD-10-CM | POA: Diagnosis not present

## 2017-05-19 DIAGNOSIS — Z6837 Body mass index (BMI) 37.0-37.9, adult: Secondary | ICD-10-CM | POA: Diagnosis not present

## 2017-05-19 LAB — HM PAP SMEAR

## 2017-05-26 DIAGNOSIS — N950 Postmenopausal bleeding: Secondary | ICD-10-CM | POA: Diagnosis not present

## 2017-05-26 DIAGNOSIS — Z1231 Encounter for screening mammogram for malignant neoplasm of breast: Secondary | ICD-10-CM | POA: Diagnosis not present

## 2017-05-26 DIAGNOSIS — D259 Leiomyoma of uterus, unspecified: Secondary | ICD-10-CM | POA: Diagnosis not present

## 2017-06-03 MED FILL — NADOLOL 20 MG TABLET: 20 | 90 days supply | Qty: 90 | Fill #1

## 2017-06-03 MED FILL — LOSARTAN POTASSIUM 25 MG TA: 25 | 90 days supply | Qty: 90 | Fill #1

## 2017-08-15 MED FILL — XARELTO 20 MG TABLET: 20 | 90 days supply | Qty: 90 | Fill #1

## 2017-08-15 MED FILL — AMOX TR-K CLV 875-125 MG TA: 875-125 | 14 days supply | Qty: 28 | Fill #1

## 2017-08-26 MED FILL — FLUCONAZOLE 150 MG TABLET: 150 | 1 days supply | Qty: 1 | Fill #1

## 2017-09-07 MED FILL — LOSARTAN POTASSIUM 25 MG TA: 25 | 90 days supply | Qty: 90 | Fill #2

## 2017-09-07 MED FILL — NADOLOL 20 MG TAB: 20 | 90 days supply | Qty: 90 | Fill #2

## 2017-11-21 MED FILL — XARELTO 20 MG TABLET: 20 | 90 days supply | Qty: 90 | Fill #2

## 2017-12-06 MED FILL — NADOLOL 20 MG TAB: 20 | 90 days supply | Qty: 90 | Fill #3

## 2017-12-06 MED FILL — LOSARTAN POTASSIUM 25 MG TA: 25 | 90 days supply | Qty: 90 | Fill #3

## 2018-01-04 ENCOUNTER — Other Ambulatory Visit: Payer: Self-pay | Admitting: Obstetrics

## 2018-01-04 DIAGNOSIS — N3000 Acute cystitis without hematuria: Secondary | ICD-10-CM

## 2018-01-04 MED ORDER — PHENAZOPYRIDINE HCL 200 MG PO TABS: 200.0000 mg | ORAL_TABLET | Freq: Three times a day (TID) | ORAL | 2 refills | Status: DC | PRN

## 2018-01-04 MED ORDER — FLUCONAZOLE 150 MG PO TABS: 150.0000 mg | ORAL_TABLET | Freq: Once | ORAL | 2 refills | Status: AC

## 2018-01-04 MED ORDER — NITROFURANTOIN MONOHYD MACRO 100 MG PO CAPS: 100.0000 mg | ORAL_CAPSULE | Freq: Two times a day (BID) | ORAL | 2 refills | Status: DC

## 2018-01-04 MED FILL — FLUCONAZOLE 150 MG TABS: 150 | 1 days supply | Qty: 1 | Fill #0

## 2018-01-04 MED FILL — PHENAZOPYRIDINE 200 MG TAB: 200 | 4 days supply | Qty: 10 | Fill #0

## 2018-01-04 MED FILL — NITROFURANTOIN MONO-MCR 100: 100 | 7 days supply | Qty: 14 | Fill #0

## 2018-01-19 DIAGNOSIS — R300 Dysuria: Secondary | ICD-10-CM | POA: Diagnosis not present

## 2018-01-19 DIAGNOSIS — N950 Postmenopausal bleeding: Secondary | ICD-10-CM | POA: Diagnosis not present

## 2018-02-02 DIAGNOSIS — L249 Irritant contact dermatitis, unspecified cause: Secondary | ICD-10-CM | POA: Diagnosis not present

## 2018-02-10 DIAGNOSIS — N950 Postmenopausal bleeding: Secondary | ICD-10-CM | POA: Diagnosis not present

## 2018-02-10 DIAGNOSIS — D259 Leiomyoma of uterus, unspecified: Secondary | ICD-10-CM | POA: Diagnosis not present

## 2018-02-20 DIAGNOSIS — N882 Stricture and stenosis of cervix uteri: Secondary | ICD-10-CM | POA: Diagnosis not present

## 2018-02-20 DIAGNOSIS — N950 Postmenopausal bleeding: Secondary | ICD-10-CM | POA: Diagnosis not present

## 2018-02-20 MED FILL — HYDROCODON-APAP 5-325: 5-325 | 3 days supply | Qty: 30 | Fill #0

## 2018-03-01 ENCOUNTER — Other Ambulatory Visit: Payer: Self-pay | Admitting: Internal Medicine

## 2018-03-01 DIAGNOSIS — M654 Radial styloid tenosynovitis [de Quervain]: Secondary | ICD-10-CM | POA: Insufficient documentation

## 2018-03-01 DIAGNOSIS — M65341 Trigger finger, right ring finger: Secondary | ICD-10-CM | POA: Diagnosis not present

## 2018-03-01 MED ORDER — RIVAROXABAN 20 MG PO TABS: ORAL_TABLET | ORAL | 1 refills | Status: DC

## 2018-03-01 MED ORDER — NADOLOL 20 MG PO TABS: 20.0000 mg | ORAL_TABLET | Freq: Every day | ORAL | 0 refills | Status: DC

## 2018-03-01 MED ORDER — LOSARTAN POTASSIUM 25 MG PO TABS: 25.0000 mg | ORAL_TABLET | Freq: Every day | ORAL | 0 refills | Status: DC

## 2018-03-01 NOTE — Telephone Encounter (Signed)
New message     *STAT* If patient is at the pharmacy, call can be transferred to refill team.   1. Which medications need to be refilled? (please list name of each medication and dose if known) XARELTO 20 MG TABS tablet, flecainide (TAMBOCOR) 150 MG tablet, losartan (COZAAR) 25 MG tablet, nadolol (CORGARD) 20 MG tablet,   2. Which pharmacy/location (including street and city if local pharmacy) is medication to be sent to? [place], [place]  3. Do they need a 30 day or 90 day supply? [place]

## 2018-03-01 NOTE — Telephone Encounter (Signed)
Age 62 years Wt 115.2kg 04/18/2017 Saw Dr Rayann Heman 03/30/2017  Has return appt to be seen by Dr Rayann Heman 05/10/2018 03/30/2017 Hgb 12.4 HCT 36.6  SrCr 0.80 CrCl 134.3 Refill done for Xarelto 20 mg daily as requested

## 2018-03-09 MED FILL — XARELTO 20 MG TABLET: 20 | 90 days supply | Qty: 90 | Fill #3

## 2018-03-09 MED FILL — LOSARTAN POTASSIUM 25 MG TA: 25 | 90 days supply | Qty: 90 | Fill #0

## 2018-03-09 MED FILL — NADOLOL 20 MG TAB: 20 | 90 days supply | Qty: 90 | Fill #0

## 2018-03-13 ENCOUNTER — Telehealth: Payer: Self-pay

## 2018-03-13 NOTE — Telephone Encounter (Signed)
Gave appointment information for Dr. Denman George on 03-21-18 at 1015. Arrive at 0945 to register.  Seth Bake to call patient.with appointment.

## 2018-03-21 ENCOUNTER — Inpatient Hospital Stay: Payer: 59 | Attending: Gynecologic Oncology | Admitting: Gynecologic Oncology

## 2018-03-21 ENCOUNTER — Encounter: Payer: Self-pay | Admitting: Gynecologic Oncology

## 2018-03-21 VITALS — BP 147/45 | HR 76 | Temp 98.4°F | Resp 20 | Ht 69.0 in | Wt 251.6 lb

## 2018-03-21 DIAGNOSIS — D259 Leiomyoma of uterus, unspecified: Secondary | ICD-10-CM | POA: Diagnosis not present

## 2018-03-21 DIAGNOSIS — N950 Postmenopausal bleeding: Secondary | ICD-10-CM | POA: Insufficient documentation

## 2018-03-21 DIAGNOSIS — Z87891 Personal history of nicotine dependence: Secondary | ICD-10-CM | POA: Insufficient documentation

## 2018-03-21 DIAGNOSIS — D251 Intramural leiomyoma of uterus: Secondary | ICD-10-CM

## 2018-03-21 DIAGNOSIS — N882 Stricture and stenosis of cervix uteri: Secondary | ICD-10-CM

## 2018-03-21 DIAGNOSIS — D250 Submucous leiomyoma of uterus: Secondary | ICD-10-CM

## 2018-03-21 DIAGNOSIS — D252 Subserosal leiomyoma of uterus: Secondary | ICD-10-CM

## 2018-03-21 NOTE — Progress Notes (Signed)
Consult Note: Gyn-Onc  Consult was requested by Dr. Garwin Brothers for the evaluation of Pamela Davenport 62 y.o. female  CC:  Chief Complaint  Patient presents with  . postmenopausal bleeding  . Fibroids  . endometrial fluid collection    Assessment/Plan:  Pamela Davenport  is a 62 y.o.  year old with a strong family of malignancy, postmenopausal bleeding, a complex endometrial collection, uterine fibroids and cervical stenosis (likely secondary to a history of ablation). She also has a history of anticoagulant use for A fibrillation and recurrent DVT's.  Given the symptoms of the bleeding and cramping pain, it is reasonable to consider hysterectomy for this patient. Ideally endometrial sampling would take place preoperatively, however, this is not possibly due to cervical stenosis. While it would be an option to take the patient to the operating room for an anesthetized D&C with ultrasound guidance, given that Dr. cousins with her considerable skill was unable to successfully sample the uterus even after Cytotec cervical ripening and ultrasound guidance raises the probability that this would be unsuccessful even in the operating room.  Rather than subjecting her to likely futile procedure, I am recommending proceeding with an MRI of the pelvis to better evaluate the endometrial cavity, look for an apparent endometrial mass and associated myometrial invasion which would be more concerning for an occult malignancy and would justify moving forward with surgery sooner.  If the imaging is reassuring on MRI, it is reasonable to plan for a somewhat more elective procedure later in the year when Pamela Davenport is able to arrange for coverage from work.  That procedure would likely be a robotic assisted total hysterectomy for uterus greater than 250 g, BSO.  A mini laparotomy may be necessary due to the large fibroid uterus.  However there was good mobility on our office exam.  We would send the uterus for frozen  section and if malignancy was identified with perform staging procedures.  I would likely inject the cervix with ICG at the beginning of the case to identify sentinel lymph nodes if necessary to remove due to the finding of cancer on frozen section.  We would keep Seniyah on anticoagulation with Xarelto up until 5 days preoperatively, then transition to Lovenox therapeutic dosing bridge until the day before surgery which would be her last dose, hold the morning of surgery, and restart postoperatively on day 1 at therapeutic dosing for 1 week.  We would change from Lovenox to Xarelto at that time if no bleeding complications have developed.  I explained that this does not eliminate either the risk for VTE or bleeding complications, but this is the most conservative approach for either.   I discussed operative risks including bleeding, infection, damage to internal organs (such as bladder,ureters, bowels), blood clot, reoperation and rehospitalization.  HPI: Pamela Davenport is a 62 year old P1 who is seen in consultation at the request of Dr Garwin Brothers for postmenopausal bleeding, endometrial fluid collection, fibroids and cervical stenosis.   I know Pamela Davenport well because she is the sister of my former patient who died from carcinosarcoma in early 2019.  Pamela. Cuen has a long-standing history of known uterine fibroids and abnormal uterine bleeding.  She was treated remotely for this with an endometrial ablation in her 18s, however continued to have bleeding and therefore was treated with uterine artery embolization.  She has had intermittent occasional vaginal spotting ever since these procedures but this was not initially bothersome to her.  In May 2019  she developed an episode of very light pink-tinged vaginal spotting after intercourse.  It went away within the course of a day.  She also has intermittent crampy abdominal pain from her fibroids.  She discussed this episode of bleeding with Dr. cousins who  performed a transvaginal ultrasound scan on February 10, 2018 which revealed an enlarged uterus measuring 12.1 x 8.5 x 7.2 cm with a thickened endometrium measuring 3.5 mm anterior and 4.6 mm posteriorly but with complex fluid visualized within the cavity.  The largest fibroid within the myometrium measured 6.5 x 4 5.6 x 4.9 cm.  The left ovary was grossly normal the right ovary was obscured.  No free fluid was seen.  An unsuccessful attempt at endometrial sampling took place.  The patient then returned for separate procedure performed on February 20, 2018 with Cytotec ripening of the cervix and ultrasound guidance of the [place].  Despite this preparation Dr. cousins was unable to pass the [place] through the cervical loss into the endometrium.  The patient has a personal medical history of atrial fibrillation since college age.  She is undergoing cardiac ablation for bigeminy.  She is no longer in atrial fibrillation she had cardioversion for this.  She does however have a history of a spontaneous DVT in her 49s.  She was treated for 6 months with anticoagulant therapy however within a month of coming off anticoagulant therapy developed a second DVT.  Her first DVT was complicated by concurrent pulmonary embolism.  Since these DVTs she has been on oral anticoagulant therapy with Xarelto.  She has a history of cervical stenosis and back pain and is undergone 2 back surgeries in the past via posterior approach.  She had a diagnostic laparoscopy in her 75s.  She has had one prior vaginal delivery.  She is never had an abnormal Pap smear and her most recent Pap smear was in 2019 and she is HPV negative.  She has a strong family history for malignancy.  Her sister died from stage IV uterine carcinosarcoma.   she has 3 brothers with the prostate cancer diagnosis.  Her paternal grandfather had colon cancer in her paternal aunt has a history of non-Hodgkin's lymphoma and another paternal aunt has a history of pancreatic  cancer.  Current Meds:  Outpatient Encounter Medications as of 03/21/2018  Medication Sig  . flecainide (TAMBOCOR) 150 MG tablet Take 2 tablets by mouth at the onset of AFIB  . hydrOXYzine (ATARAX/VISTARIL) 25 MG tablet Take 1 tablet (25 mg total) by mouth 3 (three) times daily as needed for itching.  . losartan (COZAAR) 25 MG tablet Take 1 tablet (25 mg total) by mouth daily. Please keep upcoming appt in September for future refills. Thank you  . nadolol (CORGARD) 20 MG tablet Take 1 tablet (20 mg total) by mouth daily. Please keep upcoming appt in September for future refills. Thank you  . rivaroxaban (XARELTO) 20 MG TABS tablet TAKE 1 TABLET BY MOUTH DAILY WITH SUPPER.  . [DISCONTINUED] amoxicillin-clavulanate (AUGMENTIN) 875-125 MG tablet Take 1 tablet by mouth 2 (two) times daily. (Patient not taking: Reported on 03/21/2018)  . [DISCONTINUED] nitrofurantoin, macrocrystal-monohydrate, (MACROBID) 100 MG capsule Take 1 capsule (100 mg total) by mouth 2 (two) times daily. (Patient not taking: Reported on 03/21/2018)  . [DISCONTINUED] phenazopyridine (PYRIDIUM) 200 MG tablet Take 1 tablet (200 mg total) by mouth 3 (three) times daily as needed for pain. (Patient not taking: Reported on 03/21/2018)   No facility-administered encounter medications on file as of  03/21/2018.     Allergy:  Allergies  Allergen Reactions  . Methylprednisolone     REACTION: Atrial fibrillation at end of medrol dose pack  . Sulfa Antibiotics Hives  . Sulfonamide Derivatives Hives    Social Hx:   Social History   Socioeconomic History  . Marital status: Single    Spouse name: Not on file  . Number of children: Not on file  . Years of education: Not on file  . Highest education level: Not on file  Occupational History  . Occupation: Nurse  Social Needs  . Financial resource strain: Not on file  . Food insecurity:    Worry: Not on file    Inability: Not on file  . Transportation needs:    Medical: Not on  file    Non-medical: Not on file  Tobacco Use  . Smoking status: Former Research scientist (life sciences)  . Smokeless tobacco: Never Used  . Tobacco comment: Patient hasn't smoked in 35years  Substance and Sexual Activity  . Alcohol use: Yes    Comment: rarely  . Drug use: No  . Sexual activity: Not on file  Lifestyle  . Physical activity:    Days per week: Not on file    Minutes per session: Not on file  . Stress: Not on file  Relationships  . Social connections:    Talks on phone: Not on file    Gets together: Not on file    Attends religious service: Not on file    Active member of club or organization: Not on file    Attends meetings of clubs or organizations: Not on file    Relationship status: Not on file  . Intimate partner violence:    Fear of current or ex partner: Not on file    Emotionally abused: Not on file    Physically abused: Not on file    Forced sexual activity: Not on file  Other Topics Concern  . Not on file  Social History Narrative   Works as a Marine scientist at [employer]    Past Surgical Hx:  Past Surgical History:  Procedure Laterality Date  . COLONOSCOPY W/ POLYPECTOMY    . Diagnostic D&C hysteroscopy and Novasure ablation  03/17/2004  . DILATION AND CURETTAGE OF UTERUS  12/17/2011   Procedure: DILATATION AND CURETTAGE;  Surgeon: Frederico Hamman, MD;  Location: [facility];  Service: Gynecology;  Laterality: N/A;  With Attempted Hydrothermal Ablation  . LUMBAR LAMINECTOMY/DECOMPRESSION MICRODISCECTOMY Left 02/21/2013   Procedure: LUMBAR LAMINECTOMY/DECOMPRESSION MICRODISCECTOMY 1 LEVEL;  Surgeon: Winfield Cunas, MD;  Location: [facility];  Service: Neurosurgery;  Laterality: Left;  LEFT Lumbar Three-Four Laminotomy foraminotomy microdiskectomy  . MANDIBLE SURGERY     under bite  . PVC Ablation  2010  . SPINE SURGERY    . TONSILLECTOMY    . UTERINE ARTERY EMBOLIZATION  2008   for uterine bleeding  . VASCULAR SURGERY     laser of left leg    Past Medical Hx:  Past  Medical History:  Diagnosis Date  . Atypical chest pain    normal myoview 11/11/10  . Blood transfusion without reported diagnosis   . DVT (deep venous thrombosis) (HCC)    recurrent; chronically anticoagulated with coumadin  . Family history of anesthesia complication    Sister had decrease in respirations after surgery  . Heart murmur   . Herniated lumbar intervertebral disc   . Hypertension   . Paroxysmal atrial fibrillation (HCC)   . PE (pulmonary  embolism)    chronically anticoagulated with coumadin  . Pneumonia 07/20/12  . PONV (postoperative nausea and vomiting)   . Sickle cell trait ([place])   . Strep throat at age 43   The patient reports a severe strep throat infection at age 19 and   is not clear as to whether or not she may have had rheumatic fever.  . Symptomatic premature ventricular contractions    improved s/p ablation  . Urgency of urination   . Uterine bleeding 2008   Uterine artery embolization in 2008 for uterine bleeding.    Past Gynecological History:  SVD x 1, see HPI for remaining history. No abnormal paps. No LMP recorded. Patient has had an ablation.  Family Hx:  Family History  Problem Relation Age of Onset  . Hypertension Mother   . Diabetes Mother   . Uterine cancer Sister   . Prostate cancer Brother   . Prostate cancer Paternal Grandmother   . Colon cancer Paternal Grandfather   . Prostate cancer Brother   . Prostate cancer Brother     Review of Systems:  Constitutional  Feels well,    ENT Normal appearing ears and nares bilaterally Skin/Breast  No rash, sores, jaundice, itching, dryness Cardiovascular  No chest pain, shortness of breath, or edema  Pulmonary  No cough or wheeze.  Gastro Intestinal  No nausea, vomitting, or diarrhoea. No bright red blood per rectum, no abdominal pain, change in bowel movement, or constipation.  Genito Urinary  No frequency, urgency, dysuria, + postmenopausal bleeidng, + cramping pain Musculo Skeletal   No myalgia, arthralgia, joint swelling or pain  Neurologic  No weakness, numbness, change in gait,  Psychology  No depression, anxiety, insomnia.   Vitals:  Blood pressure (!) 147/45, pulse 76, temperature 98.4 F (36.9 C), temperature source Oral, resp. rate 20, height 5\' 9"  (1.753 m), weight 251 lb 9.6 oz (114.1 kg), SpO2 100 %.  Physical Exam: WD in NAD Neck  Supple NROM, without any enlargements.  Lymph Node Survey No cervical supraclavicular or inguinal adenopathy Cardiovascular  Pulse normal rate, regularity and rhythm. S1 and S2 normal.  Lungs  Clear to auscultation bilateraly, without wheezes/crackles/rhonchi. Good air movement.  Skin  No rash/lesions/breakdown  Psychiatry  Alert and oriented to person, place, and time  Abdomen  Normoactive bowel sounds, abdomen soft, non-tender and obese without evidence of hernia. Back No CVA tenderness Genito Urinary  Vulva/vagina: Normal external female genitalia.  No lesions. No discharge or bleeding.  Bladder/urethra:  No lesions or masses, well supported bladder  Vagina: grossly normal, some relaxation of upper vagina  Cervix: Normal appearing, no lesions.  Uterus: 12cm, bulky, mobile, no parametrial involvement or nodularity.  Adnexa: no discrete masses. Rectal  deferred  Extremities  No bilateral cyanosis, clubbing or edema.   Thereasa Solo, MD  03/21/2018, 11:23 AM

## 2018-03-21 NOTE — Patient Instructions (Signed)
Dr Denman George is recommending a diagnostic hysterectomy with removal of tubes and ovaries performed robotically. She will first order an MRI to evaluate the endometrium better as this cannot be sampled. If the MRI results are concerning for malignancy, she will recommend surgery at first available. If the results are reassuring, surgery can be delayed to suit timing with work.

## 2018-03-30 ENCOUNTER — Ambulatory Visit (HOSPITAL_COMMUNITY)
Admission: RE | Admit: 2018-03-30 | Discharge: 2018-03-30 | Disposition: A | Discharge status: Home / Self Care | Payer: 59 | Source: Ambulatory Visit | Attending: Gynecologic Oncology | Admitting: Gynecologic Oncology

## 2018-03-30 DIAGNOSIS — D250 Submucous leiomyoma of uterus: Secondary | ICD-10-CM | POA: Insufficient documentation

## 2018-03-30 DIAGNOSIS — D251 Intramural leiomyoma of uterus: Secondary | ICD-10-CM | POA: Diagnosis not present

## 2018-03-30 DIAGNOSIS — D252 Subserosal leiomyoma of uterus: Secondary | ICD-10-CM | POA: Diagnosis not present

## 2018-03-30 DIAGNOSIS — D259 Leiomyoma of uterus, unspecified: Secondary | ICD-10-CM | POA: Diagnosis not present

## 2018-03-30 LAB — POCT I-STAT CREATININE: Creatinine, Ser: 0.6 mg/dL (ref 0.44–1.00)

## 2018-03-30 MED ORDER — GADOBENATE DIMEGLUMINE 529 MG/ML IV SOLN
20.0000 mL | Freq: Once | INTRAVENOUS | Status: AC | PRN
  Administered 2018-03-30: 20 mL via INTRAVENOUS

## 2018-04-03 ENCOUNTER — Telehealth: Payer: Self-pay

## 2018-04-03 NOTE — Telephone Encounter (Signed)
-----   Message from Everitt Amber, MD sent at 03/31/2018  5:28 PM EDT ----- Regarding: MRI is not concerning for cancer We can let Pamela Davenport know that there are no concerning features suggesting cancer on the MRI, therefore it is reasonable to wait until she can arrange the time off of work to proceed with hysterectomy. Terrence Dupont  ----- Message ----- From: Buel Ream, Rad Results In Sent: 03/30/2018   4:08 PM To: Everitt Amber, MD

## 2018-04-03 NOTE — Telephone Encounter (Signed)
Told Ms Ent the results of the MRI of the pelvis as noted below by Dr. Denman George. Pt will call the office to set up surgery when she is able to clear her work schedule.

## 2018-04-18 ENCOUNTER — Encounter: Payer: Self-pay | Admitting: Internal Medicine

## 2018-05-10 ENCOUNTER — Ambulatory Visit: Payer: 59 | Admitting: Internal Medicine

## 2018-05-10 DIAGNOSIS — M65321 Trigger finger, right index finger: Secondary | ICD-10-CM | POA: Diagnosis not present

## 2018-05-12 ENCOUNTER — Ambulatory Visit: Payer: 59 | Admitting: Gynecologic Oncology

## 2018-05-15 ENCOUNTER — Ambulatory Visit: Payer: 59 | Admitting: Internal Medicine

## 2018-05-16 ENCOUNTER — Encounter: Payer: Self-pay | Admitting: Internal Medicine

## 2018-05-22 ENCOUNTER — Ambulatory Visit: Payer: 59 | Admitting: Internal Medicine

## 2018-05-22 ENCOUNTER — Encounter: Payer: Self-pay | Admitting: Internal Medicine

## 2018-05-22 VITALS — BP 128/78 | HR 89 | Ht 69.0 in | Wt 259.4 lb

## 2018-05-22 DIAGNOSIS — I480 Paroxysmal atrial fibrillation: Secondary | ICD-10-CM

## 2018-05-22 DIAGNOSIS — I10 Essential (primary) hypertension: Secondary | ICD-10-CM | POA: Diagnosis not present

## 2018-05-22 DIAGNOSIS — I493 Ventricular premature depolarization: Secondary | ICD-10-CM

## 2018-05-22 MED ORDER — LOSARTAN POTASSIUM 25 MG PO TABS: 25.0000 mg | ORAL_TABLET | Freq: Every day | ORAL | 3 refills | Status: DC

## 2018-05-22 MED ORDER — NADOLOL 20 MG PO TABS: 20.0000 mg | ORAL_TABLET | Freq: Every day | ORAL | 3 refills | Status: DC

## 2018-05-22 MED ORDER — RIVAROXABAN 20 MG PO TABS: ORAL_TABLET | ORAL | 3 refills | Status: DC

## 2018-05-22 MED ORDER — FLECAINIDE ACETATE 150 MG PO TABS: ORAL_TABLET | ORAL | 3 refills | Status: DC

## 2018-05-22 MED FILL — FLECAINIDE ACETATE 150 MG T: 150 | 15 days supply | Qty: 30 | Fill #0

## 2018-05-22 NOTE — Patient Instructions (Addendum)
Medication Instructions:  Your physician recommends that you continue on your current medications as directed. Please refer to the Current Medication list given to you today.  Labwork: None ordered.  Testing/Procedures: None ordered.  Follow-Up: Your physician wants you to follow-up in: one year with Renee Ursuy PA.  You will receive a reminder letter in the mail two months in advance. If you don't receive a letter, please call our office to schedule the follow-up appointment.  Any Other Special Instructions Will Be Listed Below (If Applicable).  If you need a refill on your cardiac medications before your next appointment, please call your pharmacy.   

## 2018-05-22 NOTE — Progress Notes (Signed)
PCP: Janith Lima, MD   Primary EP: Dr Rayann Heman  Pamela Davenport is a 62 y.o. female who presents today for routine electrophysiology followup.  Since last being seen in our clinic, the patient reports doing very well.  Today, she denies symptoms of palpitations, chest pain, shortness of breath,  lower extremity edema, dizziness, presyncope, or syncope.  The patient is otherwise without complaint today.   Past Medical History:  Diagnosis Date  . Atypical chest pain    normal myoview 11/11/10  . Blood transfusion without reported diagnosis   . DVT (deep venous thrombosis) (HCC)    recurrent; chronically anticoagulated with coumadin  . Family history of anesthesia complication    Sister had decrease in respirations after surgery  . Heart murmur   . Herniated lumbar intervertebral disc   . Hypertension   . Paroxysmal atrial fibrillation (HCC)   . PE (pulmonary embolism)    chronically anticoagulated with coumadin  . Pneumonia 07/20/12  . PONV (postoperative nausea and vomiting)   . Sickle cell trait ([place])   . Strep throat at age 37   The patient reports a severe strep throat infection at age 61 and   is not clear as to whether or not she may have had rheumatic fever.  . Symptomatic premature ventricular contractions    improved s/p ablation  . Urgency of urination   . Uterine bleeding 2008   Uterine artery embolization in 2008 for uterine bleeding.   Past Surgical History:  Procedure Laterality Date  . COLONOSCOPY W/ POLYPECTOMY    . Diagnostic D&C hysteroscopy and Novasure ablation  03/17/2004  . DILATION AND CURETTAGE OF UTERUS  12/17/2011   Procedure: DILATATION AND CURETTAGE;  Surgeon: Frederico Hamman, MD;  Location: [facility];  Service: Gynecology;  Laterality: N/A;  With Attempted Hydrothermal Ablation  . LUMBAR LAMINECTOMY/DECOMPRESSION MICRODISCECTOMY Left 02/21/2013   Procedure: LUMBAR LAMINECTOMY/DECOMPRESSION MICRODISCECTOMY 1 LEVEL;  Surgeon: Winfield Cunas, MD;   Location: [facility];  Service: Neurosurgery;  Laterality: Left;  LEFT Lumbar Three-Four Laminotomy foraminotomy microdiskectomy  . MANDIBLE SURGERY     under bite  . PVC Ablation  2010  . SPINE SURGERY    . TONSILLECTOMY    . UTERINE ARTERY EMBOLIZATION  2008   for uterine bleeding  . VASCULAR SURGERY     laser of left leg    ROS- all systems are reviewed and negatives except as per HPI above  Current Outpatient Medications  Medication Sig Dispense Refill  . flecainide (TAMBOCOR) 150 MG tablet Take 2 tablets by mouth at the onset of AFIB 30 tablet 3  . hydrOXYzine (ATARAX/VISTARIL) 25 MG tablet Take 1 tablet (25 mg total) by mouth 3 (three) times daily as needed for itching. 30 tablet 2  . losartan (COZAAR) 25 MG tablet Take 1 tablet (25 mg total) by mouth daily. Please keep upcoming appt in September for future refills. Thank you 90 tablet 0  . nadolol (CORGARD) 20 MG tablet Take 1 tablet (20 mg total) by mouth daily. Please keep upcoming appt in September for future refills. Thank you 90 tablet 0  . rivaroxaban (XARELTO) 20 MG TABS tablet TAKE 1 TABLET BY MOUTH DAILY WITH SUPPER. 90 tablet 1   No current facility-administered medications for this visit.     Physical Exam: Vitals:   05/22/18 1002  BP: 128/78  Pulse: 89  SpO2: 99%  Weight: 259 lb 6.4 oz (117.7 kg)  Height: 5\' 9"  (1.753 m)  GEN- The patient is well appearing, alert and oriented x 3 today.   Head- normocephalic, atraumatic Eyes-  Sclera clear, conjunctiva pink Ears- hearing intact Oropharynx- clear Lungs- Clear to ausculation bilaterally, normal work of breathing Heart- Regular rate and rhythm, no murmurs, rubs or gallops, PMI not laterally displaced GI- soft, NT, ND, + BS Extremities- no clubbing, cyanosis, or edema  Wt Readings from Last 3 Encounters:  05/22/18 259 lb 6.4 oz (117.7 kg)  03/21/18 251 lb 9.6 oz (114.1 kg)  04/18/17 254 lb (115.2 kg)    EKG tracing ordered today is personally  reviewed and shows sinus rhythm 89 bpm, PR 186 msec, QRS 96 msec, Qtc 457 msec  Assessment and Plan:  1. PVCs Stable No change required today  2. HTN Stable No change required today Wishes to have labs drawn by PCP (due 2/20)  3. Overweight Body mass index is 38.31 kg/m. Marland Kitchenlifestyle modification encouraged  4. afib Well controlled On xarelto given prior recurrent DVT/PTE chads2vasc score is 2. Wishes to wait and have labs by PCP in February Labs 8/18 reviewed personally with her today.  Return to see EP PA every year  Thompson Grayer MD, [facility] 05/22/2018 10:17 AM

## 2018-06-08 MED FILL — XARELTO 20 MG TABLET: 20 | 90 days supply | Qty: 90 | Fill #0

## 2018-06-08 MED FILL — FLECAINIDE ACETATE 150 MG T: 150 | 15 days supply | Qty: 30 | Fill #0

## 2018-06-08 MED FILL — LOSARTAN POTASSIUM 25 MG TA: 25 | 30 days supply | Qty: 30 | Fill #0

## 2018-06-08 MED FILL — NADOLOL 20 MG TAB: 20 | 90 days supply | Qty: 90 | Fill #0

## 2018-06-23 DIAGNOSIS — M65321 Trigger finger, right index finger: Secondary | ICD-10-CM | POA: Diagnosis not present

## 2018-06-24 ENCOUNTER — Encounter (HOSPITAL_COMMUNITY): Payer: Self-pay | Admitting: Emergency Medicine

## 2018-06-24 ENCOUNTER — Ambulatory Visit (HOSPITAL_COMMUNITY)
Admission: EM | Admit: 2018-06-24 | Discharge: 2018-06-24 | Disposition: A | Discharge status: Home / Self Care | Payer: 59 | Attending: Family Medicine | Admitting: Family Medicine

## 2018-06-24 ENCOUNTER — Other Ambulatory Visit: Payer: Self-pay

## 2018-06-24 DIAGNOSIS — G5601 Carpal tunnel syndrome, right upper limb: Secondary | ICD-10-CM | POA: Diagnosis not present

## 2018-06-24 MED ORDER — PREDNISONE 20 MG PO TABS: 20.0000 mg | ORAL_TABLET | Freq: Every day | ORAL | 0 refills | Status: DC

## 2018-06-24 NOTE — ED Provider Notes (Signed)
[place]    CSN: [id number] Arrival date & time: 06/24/18  1007     History   Chief Complaint Chief Complaint  Patient presents with  . Hand Pain    HPI Pamela Davenport is a 62 y.o. female.   This is a new urgent care visit for this 63 year old woman with right hand pain.  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  The patient presented to the [facility] with a complaint of right wrist pain that started yesterday. The patient somplained of some hand and finger numbness on her right side. The patient reported that she was seen at the hand specialist yesterday as well. The patient presented with visible swelling to the right hand.  Patient had an injection by Dr. Fredna Dow a month ago into her palm to alleviate a nodule on the flexor indicis longus.  She is been doing fairly well until after a follow-up visit yesterday during which she had an exam but no further treatment.  By evening her hand is swollen up and she was having numbness and pain in her middle 3 fingers.  The dominant right hand is now swollen with tenderness over the volar wrist and pain in the middle 3 fingers.  The left side is asymptomatic.     Past Medical History:  Diagnosis Date  . Atypical chest pain    normal myoview 11/11/10  . Blood transfusion without reported diagnosis   . DVT (deep venous thrombosis) (HCC)    recurrent; chronically anticoagulated with coumadin  . Family history of anesthesia complication    Sister had decrease in respirations after surgery  . Heart murmur   . Herniated lumbar intervertebral disc   . Hypertension   . Paroxysmal atrial fibrillation (HCC)   . PE (pulmonary embolism)    chronically anticoagulated with coumadin  . Pneumonia 07/20/12  . PONV (postoperative nausea and vomiting)   . Sickle cell trait ([place])   . Strep throat at age 67   The patient reports a severe strep throat infection at age 70 and   is not clear as to  whether or not she may have had rheumatic fever.  . Symptomatic premature ventricular contractions    improved s/p ablation  . Urgency of urination   . Uterine bleeding 2008   Uterine artery embolization in 2008 for uterine bleeding.    Patient Active Problem List   Diagnosis Date Noted  . Visit for screening mammogram 04/18/2017  . Cervical cancer screening 04/18/2017  . Hyperlipidemia with target LDL less than 130 10/19/2016  . Routine general medical examination at a health care facility 02/03/2015  . Essential hypertension 01/13/2015  . PULMONARY EMBOLISM 12/05/2008  . ATRIAL FIBRILLATION 12/05/2008  . PVC's (premature ventricular contractions) 12/05/2008  . DVT 12/05/2008    Past Surgical History:  Procedure Laterality Date  . COLONOSCOPY W/ POLYPECTOMY    . Diagnostic D&C hysteroscopy and Novasure ablation  03/17/2004  . DILATION AND CURETTAGE OF UTERUS  12/17/2011   Procedure: DILATATION AND CURETTAGE;  Surgeon: Frederico Hamman, MD;  Location: [facility];  Service: Gynecology;  Laterality: N/A;  With Attempted Hydrothermal Ablation  . LUMBAR LAMINECTOMY/DECOMPRESSION MICRODISCECTOMY Left 02/21/2013   Procedure: LUMBAR LAMINECTOMY/DECOMPRESSION MICRODISCECTOMY 1 LEVEL;  Surgeon: Winfield Cunas, MD;  Location: [facility];  Service: Neurosurgery;  Laterality: Left;  LEFT Lumbar Three-Four Laminotomy foraminotomy microdiskectomy  . MANDIBLE SURGERY     under bite  .  PVC Ablation  2010  . SPINE SURGERY    . TONSILLECTOMY    . UTERINE ARTERY EMBOLIZATION  2008   for uterine bleeding  . VASCULAR SURGERY     laser of left leg    OB History   None      Home Medications    Prior to Admission medications   Medication Sig Start Date End Date Taking? Authorizing Provider  flecainide (TAMBOCOR) 150 MG tablet Take 2 tablets by mouth at the onset of AFIB 05/22/18   Allred, Jeneen Rinks, MD  hydrOXYzine (ATARAX/VISTARIL) 25 MG tablet Take 1 tablet (25 mg total) by mouth 3 (three)  times daily as needed for itching. 05/20/16   Shelly Bombard, MD  losartan (COZAAR) 25 MG tablet Take 1 tablet (25 mg total) by mouth daily. 05/22/18   Allred, Jeneen Rinks, MD  nadolol (CORGARD) 20 MG tablet Take 1 tablet (20 mg total) by mouth daily. 05/22/18   Allred, Jeneen Rinks, MD  predniSONE (DELTASONE) 20 MG tablet Take 1 tablet (20 mg total) by mouth daily with breakfast. Two daily with food 06/24/18   Robyn Haber, MD  rivaroxaban (XARELTO) 20 MG TABS tablet TAKE 1 TABLET BY MOUTH DAILY WITH SUPPER. 05/22/18   Thompson Grayer, MD    Family History Family History  Problem Relation Age of Onset  . Hypertension Mother   . Diabetes Mother   . Uterine cancer Sister   . Prostate cancer Brother   . Prostate cancer Paternal Grandmother   . Colon cancer Paternal Grandfather   . Prostate cancer Brother   . Prostate cancer Brother     Social History Social History   Tobacco Use  . Smoking status: Former Research scientist (life sciences)  . Smokeless tobacco: Never Used  . Tobacco comment: Patient hasn't smoked in 35years  Substance Use Topics  . Alcohol use: Yes    Comment: rarely  . Drug use: No     Allergies   Methylprednisolone; Sulfa antibiotics; and Sulfonamide derivatives   Review of Systems Review of Systems   Physical Exam Triage Vital Signs ED Triage Vitals [06/24/18 1042]  Enc Vitals Group     BP (!) 153/79     Pulse Rate 74     Resp 16     Temp 97.7 F (36.5 C)     Temp Source Oral     SpO2 98 %     Weight      Height      Head Circumference      Peak Flow      Pain Score 6     Pain Loc      Pain Edu?      Excl. in [place]?    No data found.  Updated Vital Signs BP (!) 153/79 (BP Location: Left Arm) Comment: Reported BP to Nurse Kim Lapan-Hutchens  Pulse 74   Temp 97.7 F (36.5 C) (Oral)   Resp 16   SpO2 98%    Physical Exam  Constitutional: She is oriented to person, place, and time. She appears well-developed and well-nourished.  HENT:  Right Ear: External ear normal.    Left Ear: External ear normal.  Eyes: Conjunctivae are normal.  Neck: Normal range of motion. Neck supple.  Pulmonary/Chest: Effort normal.  Musculoskeletal:  Pain with palpation of the volar right wrist Pain with dorsiflexion of the right wrist Diffuse swelling of the palm and wrist on the right.  Neurological: She is alert and oriented to person, place, and time.  Skin: Skin is  warm and dry. Capillary refill takes less than 2 seconds.  Nursing note and vitals reviewed.    UC Treatments / Results  Labs (all labs ordered are listed, but only abnormal results are displayed) Labs Reviewed - No data to display  EKG None  Radiology No results found.  Procedures Procedures (including critical care time)  Medications Ordered in UC Medications - No data to display  Initial Impression / Assessment and Plan / UC Course  I have reviewed the triage vital signs and the nursing notes.  Pertinent labs & imaging results that were available during my care of the patient were reviewed by me and considered in my medical decision making (see chart for details).    Final Clinical Impressions(s) / UC Diagnoses   Final diagnoses:  Carpal tunnel syndrome of right wrist     Discharge Instructions     Return if symptoms are not resolving in 2 days    ED Prescriptions    Medication Sig Dispense Auth. Provider   predniSONE (DELTASONE) 20 MG tablet Take 1 tablet (20 mg total) by mouth daily with breakfast. Two daily with food 5 tablet Robyn Haber, MD     Controlled Substance Prescriptions [place] Controlled Substance Registry consulted? Not Applicable   Robyn Haber, MD 06/24/18 1109

## 2018-06-24 NOTE — ED Triage Notes (Signed)
The patient presented to the [facility] with a complaint of right wrist pain that started yesterday. The patient somplained of some hand and finger numbness on her right side. The patient reported that she was seen at the hand specialist yesterday as well. The patient presented with visible swelling to the right hand.

## 2018-06-24 NOTE — Discharge Instructions (Signed)
Return if symptoms are not resolving in 2 days

## 2018-06-27 DIAGNOSIS — N898 Other specified noninflammatory disorders of vagina: Secondary | ICD-10-CM | POA: Diagnosis not present

## 2018-06-27 DIAGNOSIS — Z01419 Encounter for gynecological examination (general) (routine) without abnormal findings: Secondary | ICD-10-CM | POA: Diagnosis not present

## 2018-06-27 DIAGNOSIS — Z6837 Body mass index (BMI) 37.0-37.9, adult: Secondary | ICD-10-CM | POA: Diagnosis not present

## 2018-06-27 DIAGNOSIS — Z1231 Encounter for screening mammogram for malignant neoplasm of breast: Secondary | ICD-10-CM | POA: Diagnosis not present

## 2018-06-27 DIAGNOSIS — Z1151 Encounter for screening for human papillomavirus (HPV): Secondary | ICD-10-CM | POA: Diagnosis not present

## 2018-06-27 DIAGNOSIS — N952 Postmenopausal atrophic vaginitis: Secondary | ICD-10-CM | POA: Diagnosis not present

## 2018-07-12 MED FILL — LOSARTAN POTASSIUM 25 MG TA: 25 | 30 days supply | Qty: 30 | Fill #1

## 2018-07-25 ENCOUNTER — Telehealth: Payer: Self-pay | Admitting: *Deleted

## 2018-07-25 NOTE — Telephone Encounter (Signed)
Called and scheduled the patient for a pre op appt on December 12th at 10:30am

## 2018-07-31 ENCOUNTER — Other Ambulatory Visit: Payer: Self-pay | Admitting: Obstetrics

## 2018-07-31 DIAGNOSIS — G5600 Carpal tunnel syndrome, unspecified upper limb: Secondary | ICD-10-CM

## 2018-07-31 MED ORDER — PREDNISONE 20 MG PO TABS: 20.0000 mg | ORAL_TABLET | Freq: Every day | ORAL | 1 refills | Status: DC

## 2018-07-31 MED FILL — predniSONE 20 MG TABS: 20 | 5 days supply | Qty: 5 | Fill #0

## 2018-08-10 ENCOUNTER — Encounter: Payer: Self-pay | Admitting: Gynecologic Oncology

## 2018-08-10 ENCOUNTER — Inpatient Hospital Stay: Payer: 59 | Attending: Gynecologic Oncology | Admitting: Gynecologic Oncology

## 2018-08-10 VITALS — Wt 260.6 lb

## 2018-08-10 DIAGNOSIS — Z01818 Encounter for other preprocedural examination: Secondary | ICD-10-CM | POA: Insufficient documentation

## 2018-08-10 DIAGNOSIS — Z86711 Personal history of pulmonary embolism: Secondary | ICD-10-CM | POA: Insufficient documentation

## 2018-08-10 DIAGNOSIS — Z8672 Personal history of thrombophlebitis: Secondary | ICD-10-CM | POA: Insufficient documentation

## 2018-08-10 DIAGNOSIS — G8918 Other acute postprocedural pain: Secondary | ICD-10-CM

## 2018-08-10 DIAGNOSIS — N950 Postmenopausal bleeding: Secondary | ICD-10-CM | POA: Insufficient documentation

## 2018-08-10 MED ORDER — SENNOSIDES-DOCUSATE SODIUM 8.6-50 MG PO TABS: 2.0000 | ORAL_TABLET | Freq: Every day | ORAL | 1 refills | Status: DC

## 2018-08-10 MED ORDER — HYDROCODONE-ACETAMINOPHEN 5-325 MG PO TABS: 1.0000 | ORAL_TABLET | Freq: Four times a day (QID) | ORAL | 0 refills | Status: DC | PRN

## 2018-08-10 MED ORDER — ENOXAPARIN SODIUM 120 MG/0.8ML ~~LOC~~ SOLN: 1.0000 mg/kg | Freq: Two times a day (BID) | SUBCUTANEOUS | 0 refills | Status: DC

## 2018-08-10 MED ORDER — IBUPROFEN 600 MG PO TABS: 600.0000 mg | ORAL_TABLET | Freq: Four times a day (QID) | ORAL | 0 refills | Status: DC | PRN

## 2018-08-10 MED FILL — HYDROCODON-APAP 5-325: 5-325 | 1 days supply | Qty: 10 | Fill #0

## 2018-08-10 MED FILL — IBUPROFEN 600 MG TABLET: 600 | 7 days supply | Qty: 30 | Fill #0

## 2018-08-10 MED FILL — ENOXAPARIN 120 MG/0.8 ML SY: 120 | 5 days supply | Qty: 8 | Fill #0

## 2018-08-10 MED FILL — LOSARTAN POTASSIUM 25 MG TA: 25 | 30 days supply | Qty: 30 | Fill #2

## 2018-08-10 NOTE — Progress Notes (Signed)
Patient presents for a pre-operative appointment prior to her scheduled surgery on August 21, 2018. She is scheduled for a robotic assisted total laparoscopic hysterectomy greater than 250 grams, bilateral salpingo-oophorectomy, possible mini-laparotomy, possible staging.  She has her pre-admission testing appointment coming up at [facility].  The surgery was discussed in detail including the injection of ICG dye at the beginning of the surgery in case of malignancy for sentinel lymph node sampling.  See after visit summary for additional details. Visual aids used to discuss items related to surgery including the incentive spirometer, sequential compression stockings, foley catheter, IV pump, multi-modal pain regimen including tylenol and ibuprofen.   Discussed post-op pain management in detail including the aspects of the enhanced recovery pathway. She reports having difficulties with taking gabapentin in the higher doses so after discussion, this medication will not be ordered for pre-op meds with the ERAS protocol. She reports having strange side effects with tramadol but has taken hydrocodone in the past and tolerated it well.  Hydrocodone/APAP prescribed to be taken after surgery for pain if pain not relieved with tylenol or ibuprofen.  She avoids ibuprofen while taking xarelto but has taken it sparingly. Advised to monitor tylenol intake for the max of 4000 mg in a 24 hour period.  Also prescribed sennakot to be used after surgery at bedtime and to be held for loose stools.    Hx of A fib, DVT, and PE and currently on xarelto. Discussed instructions per Dr. Denman George for stopping the xarelto five days before surgery and begin taking twice daily dosing 1 mg/kg of lovenox with the last dose being the morning of the day before surgery (Dec 22). Prescription for lovenox sent to [facility].  Verbalizing understanding of material discussed. No needs or concerns voiced at the end of the visit. Advised to  call for any needs.  FMLA forms to be faxed with six weeks for post-op recovery.

## 2018-08-10 NOTE — Patient Instructions (Addendum)
KELSEY DURFLINGER  08/10/2018   Your procedure is scheduled on: 08-21-18   Report to [facility] Main  Entrance    Report to admitting at 8:00AM    Call this number if you have problems the morning of surgery [phone]     Remember: Eat a light diet the day before surgery.  Examples including soups, broths, toast, yogurt, mashed potatoes.  Things to avoid include carbonated beverages (fizzy beverages), raw fruits and raw vegetables, or beans. If your bowels are filled with gas, your surgeon will have difficulty visualizing your pelvic organs which increases your surgical risks. NO SOLID FOOD AFTER MIDNIGHT THE NIGHT PRIOR TO SURGERY. NOTHING BY MOUTH EXCEPT CLEAR LIQUIDS UNTIL 3 HOURS PRIOR TO [place] SURGERY. PLEASE FINISH ENSURE DRINK PER SURGEON ORDER 3 HOURS PRIOR TO SCHEDULED SURGERY TIME WHICH NEEDS TO BE COMPLETED AT ____7:30AM_____. BRUSH YOUR TEETH MORNING OF SURGERY AND RINSE YOUR MOUTH OUT, NO CHEWING GUM CANDY OR MINTS.      CLEAR LIQUID DIET   Foods Allowed                                                                     Foods Excluded  Coffee and tea, regular and decaf                             liquids that you cannot  Plain Jell-O in any flavor                                             see through such as: Fruit ices (not with fruit pulp)                                     milk, soups, orange juice  Iced Popsicles                                    All solid food Carbonated beverages, regular and diet                                    Cranberry, grape and apple juices Sports drinks like Gatorade Lightly seasoned clear broth or consume(fat free) Sugar, honey syrup  Sample Menu Breakfast                                Lunch                                     Supper Cranberry juice                    Beef broth  Chicken broth Jell-O                                     Grape juice                            Apple juice Coffee or tea                        Jell-O                                      Popsicle                                                Coffee or tea                        Coffee or tea  _____________________________________________________________________       Take these medicines the morning of surgery with A SIP OF WATER: NADOLOL, TAMBOCOR PER PRESCRIBERS INSTRUCTIONS                                 You may not have any metal on your body including hair pins and              piercings  Do not wear jewelry, make-up, lotions, powders or perfumes, deodorant             Do not wear nail polish.  Do not shave  48 hours prior to surgery.     Do not bring valuables to the hospital. [place].  Contacts, dentures or bridgework may not be worn into surgery.  Leave suitcase in the car. After surgery it may be brought to your room.                  Please read over the following fact sheets you were given: _____________________________________________________________________             [facility] - Preparing for Surgery Before surgery, you can play an important role.  Because skin is not sterile, your skin needs to be as free of germs as possible.  You can reduce the number of germs on your skin by washing with CHG (chlorahexidine gluconate) soap before surgery.  CHG is an antiseptic cleaner which kills germs and bonds with the skin to continue killing germs even after washing. Please DO NOT use if you have an allergy to CHG or antibacterial soaps.  If your skin becomes reddened/irritated stop using the CHG and inform your nurse when you arrive at Short Stay. Do not shave (including legs and underarms) for at least 48 hours prior to the first CHG shower.  You may shave your face/neck. Please follow these instructions carefully:  1.  Shower with CHG Soap the night before surgery and the  morning of Surgery.  2.  If you  choose to wash your hair, wash your hair first as usual  with your  normal  shampoo.  3.  After you shampoo, rinse your hair and body thoroughly to remove the  shampoo.                           4.  Use CHG as you would any other liquid soap.  You can apply chg directly  to the skin and wash                       Gently with a scrungie or clean washcloth.  5.  Apply the CHG Soap to your body ONLY FROM THE NECK DOWN.   Do not use on face/ open                           Wound or open sores. Avoid contact with eyes, ears mouth and genitals (private parts).                       Wash face,  Genitals (private parts) with your normal soap.             6.  Wash thoroughly, paying special attention to the area where your surgery  will be performed.  7.  Thoroughly rinse your body with warm water from the neck down.  8.  DO NOT shower/wash with your normal soap after using and rinsing off  the CHG Soap.                9.  Pat yourself dry with a clean towel.            10.  Wear clean pajamas.            11.  Place clean sheets on your bed the night of your first shower and do not  sleep with pets. Day of Surgery : Do not apply any lotions/deodorants the morning of surgery.  Please wear clean clothes to the hospital/surgery center.  FAILURE TO FOLLOW THESE INSTRUCTIONS MAY RESULT IN THE CANCELLATION OF YOUR SURGERY PATIENT SIGNATURE_________________________________  NURSE SIGNATURE__________________________________  ________________________________________________________________________   Adam Phenix  An incentive spirometer is a tool that can help keep your lungs clear and active. This tool measures how well you are filling your lungs with each breath. Taking long deep breaths may help reverse or decrease the chance of developing breathing (pulmonary) problems (especially infection) following:  A long period of time when you are unable to move or be active. BEFORE THE PROCEDURE   If  the spirometer includes an indicator to show your best effort, your nurse or respiratory therapist will set it to a desired goal.  If possible, sit up straight or lean slightly forward. Try not to slouch.  Hold the incentive spirometer in an upright position. INSTRUCTIONS FOR USE  1. Sit on the edge of your bed if possible, or sit up as far as you can in bed or on a chair. 2. Hold the incentive spirometer in an upright position. 3. Breathe out normally. 4. Place the mouthpiece in your mouth and seal your lips tightly around it. 5. Breathe in slowly and as deeply as possible, raising the piston or the ball toward the top of the column. 6. Hold your breath for 3-5 seconds or for as long as possible. Allow the piston or ball to fall to the bottom of the column. 7. Remove the mouthpiece from your  mouth and breathe out normally. 8. Rest for a few seconds and repeat Steps 1 through 7 at least 10 times every 1-2 hours when you are awake. Take your time and take a few normal breaths between deep breaths. 9. The spirometer may include an indicator to show your best effort. Use the indicator as a goal to work toward during each repetition. 10. After each set of 10 deep breaths, practice coughing to be sure your lungs are clear. If you have an incision (the cut made at the time of surgery), support your incision when coughing by placing a pillow or rolled up towels firmly against it. Once you are able to get out of bed, walk around indoors and cough well. You may stop using the incentive spirometer when instructed by your caregiver.  RISKS AND COMPLICATIONS  Take your time so you do not get dizzy or light-headed.  If you are in pain, you may need to take or ask for pain medication before doing incentive spirometry. It is harder to take a deep breath if you are having pain. AFTER USE  Rest and breathe slowly and easily.  It can be helpful to keep track of a log of your progress. Your caregiver can  provide you with a simple table to help with this. If you are using the spirometer at home, follow these instructions: [place] IF:   You are having difficultly using the spirometer.  You have trouble using the spirometer as often as instructed.  Your pain medication is not giving enough relief while using the spirometer.  You develop fever of 100.5 F (38.1 C) or higher. SEEK IMMEDIATE MEDICAL CARE IF:   You cough up bloody sputum that had not been present before.  You develop fever of 102 F (38.9 C) or greater.  You develop worsening pain at or near the incision site. MAKE SURE YOU:   Understand these instructions.  Will watch your condition.  Will get help right away if you are not doing well or get worse. Document Released: 12/27/2006 Document Revised: 11/08/2011 Document Reviewed: 02/27/2007 ExitCare Patient Information 2014 ExitCare, [place].   ________________________________________________________________________  WHAT IS A BLOOD TRANSFUSION? Blood Transfusion Information  A transfusion is the replacement of blood or some of its parts. Blood is made up of multiple cells which provide different functions.  Red blood cells carry oxygen and are used for blood loss replacement.  White blood cells fight against infection.  Platelets control bleeding.  Plasma helps clot blood.  Other blood products are available for specialized needs, such as hemophilia or other clotting disorders. BEFORE THE TRANSFUSION  Who gives blood for transfusions?   Healthy volunteers who are fully evaluated to make sure their blood is safe. This is blood bank blood. Transfusion therapy is the safest it has ever been in the practice of medicine. Before blood is taken from a donor, a complete history is taken to make sure that person has no history of diseases nor engages in risky social behavior (examples are intravenous drug use or sexual activity with multiple partners). The  donor's travel history is screened to minimize risk of transmitting infections, such as malaria. The donated blood is tested for signs of infectious diseases, such as HIV and hepatitis. The blood is then tested to be sure it is compatible with you in order to minimize the chance of a transfusion reaction. If you or a relative donates blood, this is often done in anticipation of surgery and is not  appropriate for emergency situations. It takes many days to process the donated blood. RISKS AND COMPLICATIONS Although transfusion therapy is very safe and saves many lives, the main dangers of transfusion include:   Getting an infectious disease.  Developing a transfusion reaction. This is an allergic reaction to something in the blood you were given. Every precaution is taken to prevent this. The decision to have a blood transfusion has been considered carefully by your caregiver before blood is given. Blood is not given unless the benefits outweigh the risks. AFTER THE TRANSFUSION  Right after receiving a blood transfusion, you will usually feel much better and more energetic. This is especially true if your red blood cells have gotten low (anemic). The transfusion raises the level of the red blood cells which carry oxygen, and this usually causes an energy increase.  The nurse administering the transfusion will monitor you carefully for complications. HOME CARE INSTRUCTIONS  No special instructions are needed after a transfusion. You may find your energy is better. Speak with your caregiver about any limitations on activity for underlying diseases you may have. SEEK MEDICAL CARE IF:   Your condition is not improving after your transfusion.  You develop redness or irritation at the intravenous (IV) site. SEEK IMMEDIATE MEDICAL CARE IF:  Any of the following symptoms occur over the next 12 hours:  Shaking chills.  You have a temperature by mouth above 102 F (38.9 C), not controlled by  medicine.  Chest, back, or muscle pain.  People around you feel you are not acting correctly or are confused.  Shortness of breath or difficulty breathing.  Dizziness and fainting.  You get a rash or develop hives.  You have a decrease in urine output.  Your urine turns a dark color or changes to pink, red, or brown. Any of the following symptoms occur over the next 10 days:  You have a temperature by mouth above 102 F (38.9 C), not controlled by medicine.  Shortness of breath.  Weakness after normal activity.  The white part of the eye turns yellow (jaundice).  You have a decrease in the amount of urine or are urinating less often.  Your urine turns a dark color or changes to pink, red, or brown. Document Released: 08/13/2000 Document Revised: 11/08/2011 Document Reviewed: 04/01/2008 [facility] Patient Information 2014 [place], [place].  _______________________________________________________________________

## 2018-08-10 NOTE — Patient Instructions (Signed)
Preparing for your Surgery  Plan for surgery on August 21, 2018 with Dr. Everitt Amber at [place] will be scheduled for a robotic assisted total hysterectomy, bilateral salpingo-oophorectomy, possible mini laparotomy, possible staging.  The uterus will be sent for frozen section and if a cancer is identified, staging procedures will be performed.  Dr. Denman George will also inject your cervix with ICG dye at the beginning of the surgery in case sentinel lymph nodes need to be removed if a cancer is identified.   Risks of surgery include infection, bleeding, damage to surrounding structures, re-operation, and rarely death.  Pre-operative Testing -You will receive a phone call from presurgical testing at [facility] if you have not received a call already to arrange for a pre-operative testing appointment before your surgery.  This appointment normally occurs one to two weeks before your scheduled surgery.   -Bring your insurance card, copy of an advanced directive if applicable, medication list  -At that visit, you will be asked to sign a consent for a possible blood transfusion in case a transfusion becomes necessary during surgery.  The need for a blood transfusion is rare but having consent is a necessary part of your care.     -As part of our enhanced surgical recovery pathway, you may be advised to drink a carbohydrate drink the morning of surgery (at least 3 hours before). If you are diabetic, this will be avoided in order to prevent elevated glucose levels.  -You are to continue taking your Xarelto up until five days from surgery.  On August 16, 2018, you are to begin taking therapeutic lovenox dosing twice a day and no more xarelto.  Your last dose of lovenox will be on August 20, 2018 in the morning.  DO NOT take lovenox the evening on the day before surgery or the morning of surgery.   Day Before Surgery at [place] will be asked to take in a light diet the day  before surgery.  Avoid carbonated beverages.  You will be advised to have nothing to eat or drink after midnight the evening before.    Eat a light diet the day before surgery.  Examples including soups, broths, toast, yogurt, mashed potatoes.  Things to avoid include carbonated beverages (fizzy beverages), raw fruits and raw vegetables, or beans.   If your bowels are filled with gas, your surgeon will have difficulty visualizing your pelvic organs which increases your surgical risks.  Your role in recovery Your role is to become active as soon as directed by your doctor, while still giving yourself time to heal.  Rest when you feel tired. You will be asked to do the following in order to speed your recovery:  - Cough and breathe deeply. This helps toclear and expand your lungs and can prevent pneumonia. You may be given a spirometer to practice deep breathing. A staff member will show you how to use the spirometer. - Do mild physical activity. Walking or moving your legs help your circulation and body functions return to normal. A staff member will help you when you try to walk and will provide you with simple exercises. Do not try to get up or walk alone the first time. - Actively manage your pain. Managing your pain lets you move in comfort. We will ask you to rate your pain on a scale of zero to 10. It is your responsibility to tell your doctor or nurse where and how much you hurt so  your pain can be treated.  Special Considerations -If you are diabetic, you may be placed on insulin after surgery to have closer control over your blood sugars to promote healing and recovery.  This does not mean that you will be discharged on insulin.  If applicable, your oral antidiabetics will be resumed when you are tolerating a solid diet.  -Your final pathology results from surgery should be available around one week after surgery and the results will be relayed to you when available.  -Dr. Precious Haws is  the Surgeon that assists your GYN Oncologist with surgery.  The next day after your surgery you will either see your GYN Oncologist, Dr. Precious Haws, or Dr. Lahoma Crocker.  -FMLA forms can be faxed to [phone] and please allow 5-7 business days for completion.  Pain Management After Surgery -You have been prescribed your pain medication and bowel regimen medications before surgery so that you can have these available when you are discharged from the hospital. The pain medication is for use ONLY AFTER surgery and a new prescription will not be given.   -Make sure that you have Tylenol and Ibuprofen at home to use on a regular basis after surgery for pain control. We recommend alternating the medications every hour to six hours since they work differently and are processed in the body differently for pain relief.  -Review the attached handout on narcotic use and their risks and side effects.    Bowel Regimen -You have been prescribed Sennakot-S to take nightly to prevent constipation especially if you are taking the narcotic pain medication intermittently.  It is important to prevent constipation and drink adequate amounts of liquids.

## 2018-08-10 NOTE — Progress Notes (Signed)
lov cards Dr. Rayann Heman, EKG 05-22-18 epic   Echo 2017 epic

## 2018-08-14 ENCOUNTER — Encounter (HOSPITAL_COMMUNITY)
Admission: RE | Admit: 2018-08-14 | Discharge: 2018-08-14 | Disposition: A | Discharge status: Home / Self Care | Payer: 59 | Source: Ambulatory Visit | Attending: Gynecologic Oncology | Admitting: Gynecologic Oncology

## 2018-08-14 ENCOUNTER — Other Ambulatory Visit: Payer: Self-pay

## 2018-08-14 ENCOUNTER — Encounter (HOSPITAL_COMMUNITY): Payer: Self-pay

## 2018-08-14 DIAGNOSIS — Z01818 Encounter for other preprocedural examination: Secondary | ICD-10-CM | POA: Diagnosis not present

## 2018-08-14 DIAGNOSIS — R0981 Nasal congestion: Secondary | ICD-10-CM

## 2018-08-14 HISTORY — DX: Nasal congestion: R09.81

## 2018-08-14 LAB — URINALYSIS, ROUTINE W REFLEX MICROSCOPIC
Bilirubin Urine: NEGATIVE
Glucose, UA: NEGATIVE mg/dL
Hgb urine dipstick: NEGATIVE
Ketones, ur: NEGATIVE mg/dL
Nitrite: NEGATIVE
Protein, ur: NEGATIVE mg/dL
Specific Gravity, Urine: 1.019 (ref 1.005–1.030)
pH: 5 (ref 5.0–8.0)

## 2018-08-14 LAB — COMPREHENSIVE METABOLIC PANEL
ALT: 14 U/L (ref 0–44)
AST: 20 U/L (ref 15–41)
Albumin: 4.3 g/dL (ref 3.5–5.0)
Alkaline Phosphatase: 80 U/L (ref 38–126)
Anion gap: 10 (ref 5–15)
BUN: 15 mg/dL (ref 8–23)
CO2: 24 mmol/L (ref 22–32)
Calcium: 9.2 mg/dL (ref 8.9–10.3)
Chloride: 105 mmol/L (ref 98–111)
Creatinine, Ser: 0.84 mg/dL (ref 0.44–1.00)
GFR calc Af Amer: >60 mL/min (ref >60)
GFR calc non Af Amer: >60 mL/min (ref >60)
Glucose, Bld: 100 mg/dL — ABNORMAL HIGH (ref 70–99)
Potassium: 4.4 mmol/L (ref 3.5–5.1)
Sodium: 139 mmol/L (ref 135–145)
Total Bilirubin: 0.5 mg/dL (ref 0.3–1.2)
Total Protein: 8.1 g/dL (ref 6.5–8.1)

## 2018-08-14 LAB — CBC
HCT: 42.5 % (ref 36.0–46.0)
Hemoglobin: 13.2 g/dL (ref 12.0–15.0)
MCH: 26.3 pg (ref 26.0–34.0)
MCHC: 31.1 g/dL (ref 30.0–36.0)
MCV: 84.8 fL (ref 80.0–100.0)
Platelets: 283 10*3/uL (ref 150–400)
RBC: 5.01 MIL/uL (ref 3.87–5.11)
RDW: 14 % (ref 11.5–15.5)
WBC: 8.5 10*3/uL (ref 4.0–10.5)
nRBC: 0 % (ref 0.0–0.2)

## 2018-08-14 MED FILL — AMOX-CLAV 875-125 MG TABLET: 875-125 | 7 days supply | Qty: 14 | Fill #0

## 2018-08-15 LAB — URINE CULTURE

## 2018-08-15 LAB — ABO/RH: ABO/RH(D): AB NEG

## 2018-08-21 ENCOUNTER — Encounter (HOSPITAL_COMMUNITY): Payer: Self-pay

## 2018-08-21 ENCOUNTER — Other Ambulatory Visit: Payer: Self-pay

## 2018-08-21 ENCOUNTER — Ambulatory Visit (HOSPITAL_BASED_OUTPATIENT_CLINIC_OR_DEPARTMENT_OTHER): Payer: 59 | Admitting: Anesthesiology

## 2018-08-21 ENCOUNTER — Ambulatory Visit (HOSPITAL_COMMUNITY)
Admission: RE | Admit: 2018-08-21 | Discharge: 2018-08-22 | Disposition: A | Discharge status: Home / Self Care | Payer: 59 | Attending: Gynecologic Oncology | Admitting: Gynecologic Oncology

## 2018-08-21 ENCOUNTER — Encounter (HOSPITAL_COMMUNITY)
Admission: RE | Disposition: A | Discharge status: Home / Self Care | Payer: Self-pay | Source: Home / Self Care | Attending: Gynecologic Oncology

## 2018-08-21 DIAGNOSIS — Z7901 Long term (current) use of anticoagulants: Secondary | ICD-10-CM | POA: Insufficient documentation

## 2018-08-21 DIAGNOSIS — Z833 Family history of diabetes mellitus: Secondary | ICD-10-CM | POA: Diagnosis not present

## 2018-08-21 DIAGNOSIS — Z87891 Personal history of nicotine dependence: Secondary | ICD-10-CM | POA: Insufficient documentation

## 2018-08-21 DIAGNOSIS — R011 Cardiac murmur, unspecified: Secondary | ICD-10-CM | POA: Diagnosis not present

## 2018-08-21 DIAGNOSIS — Z888 Allergy status to other drugs, medicaments and biological substances status: Secondary | ICD-10-CM | POA: Diagnosis not present

## 2018-08-21 DIAGNOSIS — Z79899 Other long term (current) drug therapy: Secondary | ICD-10-CM | POA: Insufficient documentation

## 2018-08-21 DIAGNOSIS — I2782 Chronic pulmonary embolism: Secondary | ICD-10-CM | POA: Diagnosis not present

## 2018-08-21 DIAGNOSIS — Z882 Allergy status to sulfonamides status: Secondary | ICD-10-CM | POA: Diagnosis not present

## 2018-08-21 DIAGNOSIS — D252 Subserosal leiomyoma of uterus: Secondary | ICD-10-CM | POA: Diagnosis not present

## 2018-08-21 DIAGNOSIS — N83201 Unspecified ovarian cyst, right side: Secondary | ICD-10-CM | POA: Insufficient documentation

## 2018-08-21 DIAGNOSIS — I4891 Unspecified atrial fibrillation: Secondary | ICD-10-CM | POA: Diagnosis not present

## 2018-08-21 DIAGNOSIS — E871 Hypo-osmolality and hyponatremia: Secondary | ICD-10-CM | POA: Insufficient documentation

## 2018-08-21 DIAGNOSIS — Z800 Family history of malignant neoplasm of digestive organs: Secondary | ICD-10-CM | POA: Insufficient documentation

## 2018-08-21 DIAGNOSIS — D251 Intramural leiomyoma of uterus: Secondary | ICD-10-CM | POA: Diagnosis not present

## 2018-08-21 DIAGNOSIS — D219 Benign neoplasm of connective and other soft tissue, unspecified: Secondary | ICD-10-CM | POA: Diagnosis present

## 2018-08-21 DIAGNOSIS — D259 Leiomyoma of uterus, unspecified: Secondary | ICD-10-CM

## 2018-08-21 DIAGNOSIS — N83202 Unspecified ovarian cyst, left side: Secondary | ICD-10-CM | POA: Diagnosis not present

## 2018-08-21 DIAGNOSIS — Z8049 Family history of malignant neoplasm of other genital organs: Secondary | ICD-10-CM | POA: Diagnosis not present

## 2018-08-21 DIAGNOSIS — Z86718 Personal history of other venous thrombosis and embolism: Secondary | ICD-10-CM | POA: Diagnosis not present

## 2018-08-21 DIAGNOSIS — I10 Essential (primary) hypertension: Secondary | ICD-10-CM | POA: Insufficient documentation

## 2018-08-21 DIAGNOSIS — D250 Submucous leiomyoma of uterus: Secondary | ICD-10-CM

## 2018-08-21 DIAGNOSIS — D573 Sickle-cell trait: Secondary | ICD-10-CM | POA: Diagnosis not present

## 2018-08-21 DIAGNOSIS — Z8042 Family history of malignant neoplasm of prostate: Secondary | ICD-10-CM | POA: Insufficient documentation

## 2018-08-21 DIAGNOSIS — N950 Postmenopausal bleeding: Secondary | ICD-10-CM | POA: Diagnosis not present

## 2018-08-21 DIAGNOSIS — I480 Paroxysmal atrial fibrillation: Secondary | ICD-10-CM | POA: Insufficient documentation

## 2018-08-21 DIAGNOSIS — N840 Polyp of corpus uteri: Secondary | ICD-10-CM | POA: Diagnosis present

## 2018-08-21 HISTORY — PX: ROBOTIC ASSISTED TOTAL HYSTERECTOMY WITH BILATERAL SALPINGO OOPHERECTOMY: SHX6086

## 2018-08-21 LAB — TYPE AND SCREEN
ABO/RH(D): AB NEG
Antibody Screen: NEGATIVE

## 2018-08-21 SURGERY — HYSTERECTOMY, TOTAL, ROBOT-ASSISTED, LAPAROSCOPIC, WITH BILATERAL SALPINGO-OOPHORECTOMY
Anesthesia: General | Laterality: Bilateral

## 2018-08-21 MED ORDER — HYDROMORPHONE HCL 1 MG/ML IJ SOLN
0.2500 mg | INTRAMUSCULAR | Status: DC | PRN
  Administered 2018-08-21 (×4): 0.5 mg via INTRAVENOUS

## 2018-08-21 MED ORDER — SUGAMMADEX SODIUM 500 MG/5ML IV SOLN
INTRAVENOUS | Status: AC
  Filled 2018-08-21: qty 5

## 2018-08-21 MED ORDER — DEXAMETHASONE SODIUM PHOSPHATE 10 MG/ML IJ SOLN
INTRAMUSCULAR | Status: DC | PRN
  Administered 2018-08-21: 10 mg via INTRAVENOUS

## 2018-08-21 MED ORDER — GLYCOPYRROLATE PF 0.2 MG/ML IJ SOSY
PREFILLED_SYRINGE | INTRAMUSCULAR | Status: AC
  Filled 2018-08-21: qty 2

## 2018-08-21 MED ORDER — MIDAZOLAM HCL 2 MG/2ML IJ SOLN
INTRAMUSCULAR | Status: DC | PRN
  Administered 2018-08-21: 2 mg via INTRAVENOUS

## 2018-08-21 MED ORDER — HYDROMORPHONE HCL 1 MG/ML IJ SOLN: 0.5000 mg | INTRAMUSCULAR | Status: DC | PRN

## 2018-08-21 MED ORDER — SUGAMMADEX SODIUM 200 MG/2ML IV SOLN
INTRAVENOUS | Status: DC | PRN
  Administered 2018-08-21: 240 mg via INTRAVENOUS

## 2018-08-21 MED ORDER — AMOXICILLIN-POT CLAVULANATE 875-125 MG PO TABS
1.0000 | ORAL_TABLET | Freq: Two times a day (BID) | ORAL | Status: AC
  Administered 2018-08-21: 1 via ORAL
  Filled 2018-08-21: qty 1

## 2018-08-21 MED ORDER — ACETAMINOPHEN 500 MG PO TABS
1000.0000 mg | ORAL_TABLET | ORAL | Status: AC
  Administered 2018-08-21: 1000 mg via ORAL
  Filled 2018-08-21: qty 2

## 2018-08-21 MED ORDER — LIDOCAINE 2% (20 MG/ML) 5 ML SYRINGE
INTRAMUSCULAR | Status: AC
  Filled 2018-08-21: qty 5

## 2018-08-21 MED ORDER — KETOROLAC TROMETHAMINE 30 MG/ML IJ SOLN
INTRAMUSCULAR | Status: DC | PRN
  Administered 2018-08-21: 30 mg via INTRAVENOUS

## 2018-08-21 MED ORDER — ENSURE SURGERY PO LIQD
237.0000 mL | Freq: Two times a day (BID) | ORAL | Status: DC
  Administered 2018-08-22: 237 mL via ORAL
  Filled 2018-08-21 (×2): qty 237

## 2018-08-21 MED ORDER — DEXAMETHASONE SODIUM PHOSPHATE 10 MG/ML IJ SOLN
INTRAMUSCULAR | Status: AC
  Filled 2018-08-21: qty 1

## 2018-08-21 MED ORDER — KETOROLAC TROMETHAMINE 30 MG/ML IJ SOLN
30.0000 mg | Freq: Once | INTRAMUSCULAR | Status: AC | PRN
  Administered 2018-08-21: 30 mg via INTRAVENOUS

## 2018-08-21 MED ORDER — NADOLOL 20 MG PO TABS
20.0000 mg | ORAL_TABLET | Freq: Every day | ORAL | Status: DC
  Administered 2018-08-21: 20 mg via ORAL
  Filled 2018-08-21 (×2): qty 1

## 2018-08-21 MED ORDER — STERILE WATER FOR INJECTION IJ SOLN
INTRAMUSCULAR | Status: AC
  Filled 2018-08-21: qty 10

## 2018-08-21 MED ORDER — HYDROMORPHONE HCL 1 MG/ML IJ SOLN
INTRAMUSCULAR | Status: AC
  Filled 2018-08-21: qty 1

## 2018-08-21 MED ORDER — HYDROMORPHONE HCL 1 MG/ML IJ SOLN
0.5000 mg | INTRAMUSCULAR | Status: AC | PRN
  Administered 2018-08-21 (×2): 0.5 mg via INTRAVENOUS

## 2018-08-21 MED ORDER — KETOROLAC TROMETHAMINE 30 MG/ML IJ SOLN
30.0000 mg | Freq: Four times a day (QID) | INTRAMUSCULAR | Status: DC | PRN
  Administered 2018-08-21: 30 mg via INTRAVENOUS
  Filled 2018-08-21: qty 1

## 2018-08-21 MED ORDER — SCOPOLAMINE 1 MG/3DAYS TD PT72
1.0000 | MEDICATED_PATCH | TRANSDERMAL | Status: DC
  Administered 2018-08-21: 1.5 mg via TRANSDERMAL
  Filled 2018-08-21: qty 1

## 2018-08-21 MED ORDER — ONDANSETRON HCL 4 MG/2ML IJ SOLN: 4.0000 mg | Freq: Four times a day (QID) | INTRAMUSCULAR | Status: DC | PRN

## 2018-08-21 MED ORDER — ENOXAPARIN SODIUM 120 MG/0.8ML ~~LOC~~ SOLN
1.0000 mg/kg | Freq: Two times a day (BID) | SUBCUTANEOUS | Status: DC
  Administered 2018-08-22: 120 mg via SUBCUTANEOUS
  Filled 2018-08-21: qty 0.79

## 2018-08-21 MED ORDER — SUCCINYLCHOLINE CHLORIDE 200 MG/10ML IV SOSY: PREFILLED_SYRINGE | INTRAVENOUS | Status: DC | PRN

## 2018-08-21 MED ORDER — OXYCODONE HCL 5 MG PO TABS: 5.0000 mg | ORAL_TABLET | Freq: Once | ORAL | Status: DC

## 2018-08-21 MED ORDER — ONDANSETRON HCL 4 MG PO TABS: 4.0000 mg | ORAL_TABLET | Freq: Four times a day (QID) | ORAL | Status: DC | PRN

## 2018-08-21 MED ORDER — LACTATED RINGERS IV SOLN
INTRAVENOUS | Status: DC
  Administered 2018-08-21 – 2018-08-22 (×2): via INTRAVENOUS

## 2018-08-21 MED ORDER — KETOROLAC TROMETHAMINE 30 MG/ML IJ SOLN
INTRAMUSCULAR | Status: AC
  Filled 2018-08-21: qty 1

## 2018-08-21 MED ORDER — OXYCODONE-ACETAMINOPHEN 5-325 MG PO TABS
1.0000 | ORAL_TABLET | ORAL | Status: DC | PRN
  Administered 2018-08-21: 1 via ORAL
  Administered 2018-08-21: 2 via ORAL
  Filled 2018-08-21: qty 2
  Filled 2018-08-21: qty 1

## 2018-08-21 MED ORDER — PROPOFOL 10 MG/ML IV BOLUS
INTRAVENOUS | Status: DC | PRN
  Administered 2018-08-21: 200 mg via INTRAVENOUS

## 2018-08-21 MED ORDER — FENTANYL CITRATE (PF) 100 MCG/2ML IJ SOLN
INTRAMUSCULAR | Status: DC | PRN
  Administered 2018-08-21 (×3): 25 ug via INTRAVENOUS
  Administered 2018-08-21: 100 ug via INTRAVENOUS
  Administered 2018-08-21: 50 ug via INTRAVENOUS
  Administered 2018-08-21: 25 ug via INTRAVENOUS

## 2018-08-21 MED ORDER — ONDANSETRON HCL 4 MG/2ML IJ SOLN
INTRAMUSCULAR | Status: AC
  Filled 2018-08-21: qty 2

## 2018-08-21 MED ORDER — ACETAMINOPHEN 500 MG PO TABS
1000.0000 mg | ORAL_TABLET | Freq: Four times a day (QID) | ORAL | Status: DC
  Administered 2018-08-21 – 2018-08-22 (×2): 1000 mg via ORAL
  Filled 2018-08-21 (×2): qty 2

## 2018-08-21 MED ORDER — BUPIVACAINE HCL (PF) 0.25 % IJ SOLN
INTRAMUSCULAR | Status: DC | PRN
  Administered 2018-08-21: 10 mL

## 2018-08-21 MED ORDER — MIDAZOLAM HCL 2 MG/2ML IJ SOLN
INTRAMUSCULAR | Status: AC
  Filled 2018-08-21: qty 2

## 2018-08-21 MED ORDER — ROCURONIUM BROMIDE 10 MG/ML (PF) SYRINGE
PREFILLED_SYRINGE | INTRAVENOUS | Status: AC
  Filled 2018-08-21: qty 10

## 2018-08-21 MED ORDER — PROMETHAZINE HCL 25 MG/ML IJ SOLN: 6.2500 mg | INTRAMUSCULAR | Status: DC | PRN

## 2018-08-21 MED ORDER — ROCURONIUM BROMIDE 10 MG/ML (PF) SYRINGE
PREFILLED_SYRINGE | INTRAVENOUS | Status: DC | PRN
  Administered 2018-08-21: 70 mg via INTRAVENOUS

## 2018-08-21 MED ORDER — LIDOCAINE 2% (20 MG/ML) 5 ML SYRINGE
INTRAMUSCULAR | Status: DC | PRN
  Administered 2018-08-21: 1.5 mg/kg/h via INTRAVENOUS

## 2018-08-21 MED ORDER — CEFAZOLIN SODIUM-DEXTROSE 2-4 GM/100ML-% IV SOLN
2.0000 g | INTRAVENOUS | Status: AC
  Administered 2018-08-21: 2 g via INTRAVENOUS
  Filled 2018-08-21: qty 100

## 2018-08-21 MED ORDER — ONDANSETRON HCL 4 MG/2ML IJ SOLN
INTRAMUSCULAR | Status: DC | PRN
  Administered 2018-08-21: 4 mg via INTRAVENOUS

## 2018-08-21 MED ORDER — LACTATED RINGERS IV SOLN
INTRAVENOUS | Status: DC
  Administered 2018-08-21: 09:00:00 via INTRAVENOUS

## 2018-08-21 MED ORDER — GABAPENTIN 300 MG PO CAPS
300.0000 mg | ORAL_CAPSULE | Freq: Two times a day (BID) | ORAL | Status: DC
  Filled 2018-08-21 (×2): qty 1

## 2018-08-21 MED ORDER — LIDOCAINE 2% (20 MG/ML) 5 ML SYRINGE
INTRAMUSCULAR | Status: DC | PRN
  Administered 2018-08-21: 80 mg via INTRAVENOUS

## 2018-08-21 MED ORDER — SODIUM CHLORIDE 0.9 % IR SOLN
Status: DC | PRN
  Administered 2018-08-21: 1000 mL

## 2018-08-21 MED ORDER — FENTANYL CITRATE (PF) 250 MCG/5ML IJ SOLN
INTRAMUSCULAR | Status: AC
  Filled 2018-08-21: qty 5

## 2018-08-21 MED ORDER — DEXAMETHASONE SODIUM PHOSPHATE 4 MG/ML IJ SOLN: 4.0000 mg | INTRAMUSCULAR | Status: DC

## 2018-08-21 MED ORDER — NEOSTIGMINE METHYLSULFATE 3 MG/3ML IV SOSY
PREFILLED_SYRINGE | INTRAVENOUS | Status: AC
  Filled 2018-08-21: qty 3

## 2018-08-21 MED ORDER — BUPIVACAINE HCL (PF) 0.25 % IJ SOLN
INTRAMUSCULAR | Status: AC
  Filled 2018-08-21: qty 30

## 2018-08-21 SURGICAL SUPPLY — 56 items
ADH SKN CLS APL DERMABOND .7 (GAUZE/BANDAGES/DRESSINGS) ×1
AGENT HMST KT MTR STRL THRMB (HEMOSTASIS)
APL ESCP 34 STRL LF DISP (HEMOSTASIS)
APPLICATOR SURGIFLO ENDO (HEMOSTASIS) IMPLANT
BAG LAPAROSCOPIC 12 15 PORT 16 (BASKET) IMPLANT
BAG RETRIEVAL 12/15 (BASKET)
BAG SPEC RTRVL LRG 6X4 10 (ENDOMECHANICALS)
COVER BACK TABLE 60X90IN (DRAPES) ×2 IMPLANT
COVER TIP SHEARS 8 DVNC (MISCELLANEOUS) ×1 IMPLANT
COVER TIP SHEARS 8MM DA VINCI (MISCELLANEOUS) ×1
COVER WAND RF STERILE (DRAPES) IMPLANT
DERMABOND ADVANCED (GAUZE/BANDAGES/DRESSINGS) ×1
DERMABOND ADVANCED .7 DNX12 (GAUZE/BANDAGES/DRESSINGS) ×1 IMPLANT
DRAPE ARM DVNC X/XI (DISPOSABLE) ×4 IMPLANT
DRAPE COLUMN DVNC XI (DISPOSABLE) ×1 IMPLANT
DRAPE DA VINCI XI ARM (DISPOSABLE) ×4
DRAPE DA VINCI XI COLUMN (DISPOSABLE) ×1
DRAPE SHEET LG 3/4 BI-LAMINATE (DRAPES) ×2 IMPLANT
DRAPE SURG IRRIG POUCH 19X23 (DRAPES) ×2 IMPLANT
ELECT REM PT RETURN 9FT ADLT (ELECTROSURGICAL) ×2
ELECTRODE REM PT RTRN 9FT ADLT (ELECTROSURGICAL) ×1 IMPLANT
GLOVE BIO SURGEON STRL SZ 6 (GLOVE) ×8 IMPLANT
GLOVE BIO SURGEON STRL SZ 6.5 (GLOVE) IMPLANT
GOWN STRL REUS W/ TWL LRG LVL3 (GOWN DISPOSABLE) ×5 IMPLANT
GOWN STRL REUS W/TWL LRG LVL3 (GOWN DISPOSABLE) ×10
HOLDER FOLEY CATH W/STRAP (MISCELLANEOUS) ×2 IMPLANT
IRRIG SUCT STRYKERFLOW 2 WTIP (MISCELLANEOUS) ×2
IRRIGATION SUCT STRKRFLW 2 WTP (MISCELLANEOUS) ×1 IMPLANT
KIT PROCEDURE DA VINCI SI (MISCELLANEOUS)
KIT PROCEDURE DVNC SI (MISCELLANEOUS) IMPLANT
MANIPULATOR UTERINE 4.5 ZUMI (MISCELLANEOUS) ×2 IMPLANT
NDL SPNL 18GX3.5 QUINCKE PK (NEEDLE) IMPLANT
NEEDLE SPNL 18GX3.5 QUINCKE PK (NEEDLE) IMPLANT
OBTURATOR OPTICAL STANDARD 8MM (TROCAR) ×1
OBTURATOR OPTICAL STND 8 DVNC (TROCAR) ×1
OBTURATOR OPTICALSTD 8 DVNC (TROCAR) ×1 IMPLANT
PACK ROBOT GYN CUSTOM WL (TRAY / TRAY PROCEDURE) ×2 IMPLANT
PAD POSITIONING PINK XL (MISCELLANEOUS) ×2 IMPLANT
PORT ACCESS TROCAR AIRSEAL 12 (TROCAR) ×1 IMPLANT
PORT ACCESS TROCAR AIRSEAL 5M (TROCAR) ×1
POUCH SPECIMEN RETRIEVAL 10MM (ENDOMECHANICALS) IMPLANT
SEAL CANN UNIV 5-8 DVNC XI (MISCELLANEOUS) ×4 IMPLANT
SEAL XI 5MM-8MM UNIVERSAL (MISCELLANEOUS) ×4
SET TRI-LUMEN FLTR TB AIRSEAL (TUBING) ×2 IMPLANT
SURGIFLO W/THROMBIN 8M KIT (HEMOSTASIS) IMPLANT
SUT MNCRL AB 4-0 PS2 18 (SUTURE) ×4 IMPLANT
SUT VIC AB 0 CT1 27 (SUTURE) ×2
SUT VIC AB 0 CT1 27XBRD ANTBC (SUTURE) ×1 IMPLANT
SUT VIC AB 3-0 SH 27 (SUTURE) ×2
SUT VIC AB 3-0 SH 27X BRD (SUTURE) IMPLANT
SYR 10ML LL (SYRINGE) IMPLANT
TOWEL OR NON WOVEN STRL DISP B (DISPOSABLE) ×2 IMPLANT
TRAP SPECIMEN MUCOUS 40CC (MISCELLANEOUS) IMPLANT
TRAY FOLEY MTR SLVR 16FR STAT (SET/KITS/TRAYS/PACK) ×2 IMPLANT
UNDERPAD 30X30 (UNDERPADS AND DIAPERS) ×2 IMPLANT
WATER STERILE IRR 1000ML POUR (IV SOLUTION) ×2 IMPLANT

## 2018-08-21 NOTE — Op Note (Signed)
OPERATIVE NOTE 08/21/18  Surgeon: Donaciano Eva   Assistants: Dr Precious Haws (an MD assistant was necessary for tissue manipulation, management of robotic instrumentation, retraction and positioning due to the complexity of the case and hospital policies).   Anesthesia: General endotracheal anesthesia  ASA Class: 3   Pre-operative Diagnosis: postmenopausal bleeding, fibroid uterus  Post-operative Diagnosis: same  Operation: Robotic-assisted laparoscopic total hysterectomy for a uterus >250gm with bilateral salpingoophorectomy   Surgeon: Donaciano Eva  Assistant Surgeon: Precious Haws MD  Anesthesia: GET  Urine Output: 100cc  Operative Findings:  : 12cm fibroid bulky uterus, normal tubes and ovaries bilaterally. Frozen section revealed benign endometrium and benign fibroids.   Estimated Blood Loss:  less than 50 mL      Total IV Fluids: 700 ml         Specimens: uterus with cervix and bilateral tubes and ovaries.         Complications:  None; patient tolerated the procedure well.         Disposition: PACU - hemodynamically stable.  Procedure Details  The patient was seen in the Holding Room. The risks, benefits, complications, treatment options, and expected outcomes were discussed with the patient.  The patient concurred with the proposed plan, giving informed consent.  The site of surgery properly noted/marked. The patient was identified as Pamela Davenport and the procedure verified as a Robotic-assisted hysterectomy with bilateral salpingo oophorectomy. A Time Out was held and the above information confirmed.  After induction of anesthesia, the patient was draped and prepped in the usual sterile manner. Pt was placed in supine position after anesthesia and draped and prepped in the usual sterile manner. The abdominal drape was placed after the CholoraPrep had been allowed to dry for 3 minutes.  Her arms were tucked to her side with all appropriate  precautions.  The shoulders were stabilized with padded shoulder blocks applied to the acromium processes.  The patient was placed in the semi-lithotomy position in [place].  The perineum was prepped with Betadine. The patient was then prepped. Foley catheter was placed.  A sterile speculum was placed in the vagina.  The cervix was grasped with a single-tooth tenaculum and dilated with Kennon Rounds dilators.  The ZUMI uterine manipulator with a medium colpotomizer ring was placed without difficulty.  A pneum occluder balloon was placed over the manipulator.  OG tube placement was confirmed and to suction.   Next, a 5 mm skin incision was made 1 cm below the subcostal margin in the midclavicular line.  The 5 mm Optiview port and scope was used for direct entry.  Opening pressure was under 10 mm CO2.  The abdomen was insufflated and the findings were noted as above.   At this point and all points during the procedure, the patient's intra-abdominal pressure did not exceed 15 mmHg. Next, a 10 mm skin incision was made in the umbilicus and a right and left port was placed about 10 cm lateral to the robot port on the right and left side.  The patient was placed in steep Trendelenburg.  Bowel was folded away into the upper abdomen.  The robot was docked in the normal manner.  The hysterectomy was started after the round ligament on the right side was incised and the retroperitoneum was entered and the pararectal space was developed.  The ureter was noted to be on the medial leaf of the broad ligament.  The peritoneum above the ureter was incised and stretched and the infundibulopelvic  ligament was skeletonized, cauterized and cut.  The posterior peritoneum was taken down to the level of the KOH ring.  The anterior peritoneum was also taken down.  The bladder flap was created to the level of the KOH ring.  The uterine artery on the right side was skeletonized, cauterized and cut in the normal manner.  A similar  procedure was performed on the left.  The colpotomy was made and the uterus, cervix, bilateral ovaries and tubes were amputated and delivered through the vagina with some difficulty as the uterus was very large. With gentle traction and sweeping maneuvers the uterus was delivered in tact. Frozen section was sent and was benign.     At this part of the procedure we had to undock for 5 minutes for the anesthesia team to reposition the ETT as there was a leak causing desaturation. Pedicles were inspected and excellent hemostasis was achieved.    The colpotomy at the vaginal cuff was closed with Vicryl on a CT1 needle in a running manner.  Irrigation was used and excellent hemostasis was achieved.  At this point in the procedure was completed.  Robotic instruments were removed under direct visulaization.  The robot was undocked. The 10 mm ports were closed with Vicryl on a UR-5 needle and the fascia was closed with 0 Vicryl on a UR-5 needle.  The skin was closed with 4-0 Vicryl in a subcuticular manner.  Dermabond was applied.  Sponge, lap and needle counts correct x 2.  The patient was taken to the recovery room in stable condition.  The vagina was swabbed with bleeding noted from the introitus which was made hemostatic with 3-0 vicryl.   All instrument and needle counts were correct x  3.   The patient was transferred to the recovery room in a stable condition.  Donaciano Eva, MD

## 2018-08-21 NOTE — Transfer of Care (Signed)
Immediate Anesthesia Transfer of Care Note  Patient: Pamela Davenport  Procedure(s) Performed: Procedure(s) (LRB): XI ROBOTIC ASSISTED TOTAL HYSTERECTOMY WITH BILATERAL SALPINGO OOPHORECTOMY (Bilateral)  Patient Location: PACU  Anesthesia Type: General  Level of Consciousness: awake, oriented, sedated and patient cooperative  Airway & Oxygen Therapy: Patient Spontanous Breathing and Patient connected to face mask oxygen  Post-op Assessment: Report given to PACU RN and Post -op Vital signs reviewed and stable  Post vital signs: Reviewed and stable  Complications: No apparent anesthesia complications Last Vitals:  Vitals Value Taken Time  BP    Temp 36.4 C 08/21/2018 11:45 AM  Pulse 74 08/21/2018 11:51 AM  Resp 18 08/21/2018 11:51 AM  SpO2 100 % 08/21/2018 11:51 AM  Vitals shown include unvalidated device data.  Last Pain:  Vitals:   08/21/18 0845  TempSrc:   PainSc: 0-No pain

## 2018-08-21 NOTE — Anesthesia Preprocedure Evaluation (Signed)
Anesthesia Evaluation  Patient identified by MRN, date of birth, ID band Patient awake    Reviewed: Allergy & Precautions, NPO status , Patient's Chart, lab work & pertinent test results  History of Anesthesia Complications (+) PONV  Airway Mallampati: II  TM Distance: >3 FB Neck ROM: Full    Dental no notable dental hx.    Pulmonary neg pulmonary ROS, former smoker,    Pulmonary exam normal breath sounds clear to auscultation       Cardiovascular hypertension, Normal cardiovascular exam+ dysrhythmias Atrial Fibrillation  Rhythm:Regular Rate:Normal     Neuro/Psych negative neurological ROS  negative psych ROS   GI/Hepatic negative GI ROS, Neg liver ROS,   Endo/Other  negative endocrine ROS  Renal/GU negative Renal ROS  negative genitourinary   Musculoskeletal negative musculoskeletal ROS (+)   Abdominal   Peds negative pediatric ROS (+)  Hematology  (+) Sickle cell trait , On xarelto   Anesthesia Other Findings   Reproductive/Obstetrics negative OB ROS                             Anesthesia Physical Anesthesia Plan  ASA: III  Anesthesia Plan: General   Post-op Pain Management:    Induction: Intravenous  PONV Risk Score and Plan: 4 or greater and Ondansetron, Dexamethasone, Treatment may vary due to age or medical condition, Midazolam and Scopolamine patch - Pre-op  Airway Management Planned: Oral ETT  Additional Equipment:   Intra-op Plan:   Post-operative Plan: Extubation in OR  Informed Consent: I have reviewed the patients History and Physical, chart, labs and discussed the procedure including the risks, benefits and alternatives for the proposed anesthesia with the patient or authorized representative who has indicated his/her understanding and acceptance.   Dental advisory given  Plan Discussed with: CRNA and Surgeon  Anesthesia Plan Comments:          Anesthesia Quick Evaluation

## 2018-08-21 NOTE — Anesthesia Postprocedure Evaluation (Signed)
Anesthesia Post Note  Patient: Pamela Davenport  Procedure(s) Performed: XI ROBOTIC ASSISTED TOTAL HYSTERECTOMY WITH BILATERAL SALPINGO OOPHORECTOMY (Bilateral )     Patient location during evaluation: PACU Anesthesia Type: General Level of consciousness: awake and alert Pain management: pain level controlled Vital Signs Assessment: post-procedure vital signs reviewed and stable Respiratory status: spontaneous breathing, nonlabored ventilation, respiratory function stable and patient connected to nasal cannula oxygen Cardiovascular status: blood pressure returned to baseline and stable Postop Assessment: no apparent nausea or vomiting Anesthetic complications: no    Last Vitals:  Vitals:   08/21/18 1315 08/21/18 1330  BP: (!) 155/82 (!) 162/84  Pulse: 65 73  Resp: 14 (!) 21  Temp:  (!) 36.2 C  SpO2: 100% 100%    Last Pain:  Vitals:   08/21/18 1315  TempSrc:   PainSc: 4                  Reggie Welge S

## 2018-08-21 NOTE — Anesthesia Procedure Notes (Signed)
Procedure Name: Intubation Date/Time: 08/21/2018 9:56 AM Performed by: Suan Halter, CRNA Pre-anesthesia Checklist: Patient identified, Emergency Drugs available, Suction available and Patient being monitored Patient Re-evaluated:Patient Re-evaluated prior to induction Oxygen Delivery Method: Circle system utilized Preoxygenation: Pre-oxygenation with 100% oxygen Induction Type: IV induction Ventilation: Mask ventilation without difficulty Laryngoscope Size: Mac and 3 Grade View: Grade I Tube type: Oral Tube size: 7.5 mm Number of attempts: 1 Airway Equipment and Method: Stylet and Oral airway Placement Confirmation: ETT inserted through vocal cords under direct vision,  positive ETCO2 and breath sounds checked- equal and bilateral Secured at: 6 cm Tube secured with: Tape Dental Injury: Teeth and Oropharynx as per pre-operative assessment

## 2018-08-21 NOTE — Discharge Instructions (Signed)
08/21/2018  Return to work: 4 weeks  Activity: 1. Be up and out of the bed during the day.  Take a nap if needed.  You may walk up steps but be careful and use the hand rail.  Stair climbing will tire you more than you think, you may need to stop part way and rest.   2. No lifting or straining for 6 weeks.  3. No driving for 1 weeks.  Do Not drive if you are taking narcotic pain medicine.  4. Shower daily.  Use soap and water on your incision and pat dry; don't rub.   5. No sexual activity and nothing in the vagina for 8 weeks.  Medications:  - Take ibuprofen and tylenol first line for pain control. Take these regularly (every 6 hours) to decrease the build up of pain.  - If necessary, for severe pain not relieved by ibuprofen, take percocet.  - While taking percocet you should take sennakot every night to reduce the likelihood of constipation. If this causes diarrhea, stop its use.  Diet: 1. Low sodium Heart Healthy Diet is recommended.  2. It is safe to use a laxative if you have difficulty moving your bowels.   Wound Care: 1. Keep clean and dry.  Shower daily.  Reasons to call the Doctor:   Fever - Oral temperature greater than 100.4 degrees Fahrenheit  Foul-smelling vaginal discharge  Difficulty urinating  Nausea and vomiting  Increased pain at the site of the incision that is unrelieved with pain medicine.  Difficulty breathing with or without chest pain  New calf pain especially if only on one side  Sudden, continuing increased vaginal bleeding with or without clots.   Follow-up: 1. See Everitt Amber in 3 weeks.  Contacts: For questions or concerns you should contact:  Dr. Everitt Amber at [phone] After hours and on week-ends call [phone] and ask to speak to the physician on call for Gynecologic Oncology

## 2018-08-21 NOTE — H&P (Signed)
H&P Note: Gyn-Onc  Consult was requested by Dr. Garwin Brothers for the evaluation of Pamela Davenport 62 y.o. female  CC:  Postmenopausal bleeding, fibroid uterus.  Assessment/Plan:  Pamela Davenport  is a 62 y.o.  year old with a strong family of malignancy, postmenopausal bleeding, a complex endometrial collection, uterine fibroids and cervical stenosis (likely secondary to a history of ablation). Pamela Davenport also has a history of anticoagulant use for A fibrillation and recurrent DVT's.  Given the symptoms of the bleeding and cramping pain, it is reasonable to consider hysterectomy for this patient. Ideally endometrial sampling would take place preoperatively, however, this is not possibly due to cervical stenosis. While it would be an option to take the patient to the operating room for an anesthetized D&C with ultrasound guidance, given that Dr. cousins with her considerable skill was unable to successfully sample the uterus even after Cytotec cervical ripening and ultrasound guidance raises the probability that this would be unsuccessful even in the operating room.  No myometrial invasion was seen on preop imaging with MRI.   The planned procedure is a robotic assisted total hysterectomy for uterus greater than 250 g, BSO.  A mini laparotomy may be necessary due to the large fibroid uterus.  However there was good mobility on our office exam.  We would send the uterus for frozen section and if malignancy was identified with perform staging procedures.  I would likely inject the cervix with ICG at the beginning of the case to identify sentinel lymph nodes if necessary to remove due to the finding of cancer on frozen section.  Pamela Davenport has been bridged on Lovenox (Pamela Davenport admits to last taking a dose Saturday rather than the planned Sunday).  I explained that this does not eliminate either the risk for VTE or bleeding complications, but this is the most conservative approach for either.   I discussed operative  risks including bleeding, infection, damage to internal organs (such as bladder,ureters, bowels), blood clot, reoperation and rehospitalization.  HPI: Pamela Davenport is a 62 year old P1 who is seen in consultation at the request of Dr Garwin Brothers for postmenopausal bleeding, endometrial fluid collection, fibroids and cervical stenosis.   I know [place] well because Pamela Davenport is the sister of my former patient who died from carcinosarcoma in early 2019.  Pamela. Cahn has a long-standing history of known uterine fibroids and abnormal uterine bleeding.  Pamela Davenport was treated remotely for this with an endometrial ablation in her 67s, however continued to have bleeding and therefore was treated with uterine artery embolization.  Pamela Davenport has had intermittent occasional vaginal spotting ever since these procedures but this was not initially bothersome to her.  In May 2019 Pamela Davenport developed an episode of very light pink-tinged vaginal spotting after intercourse.  It went away within the course of a day.  Pamela Davenport also has intermittent crampy abdominal pain from her fibroids.  Pamela Davenport discussed this episode of bleeding with Dr. cousins who performed a transvaginal ultrasound scan on February 10, 2018 which revealed an enlarged uterus measuring 12.1 x 8.5 x 7.2 cm with a thickened endometrium measuring 3.5 mm anterior and 4.6 mm posteriorly but with complex fluid visualized within the cavity.  The largest fibroid within the myometrium measured 6.5 x 4 5.6 x 4.9 cm.  The left ovary was grossly normal the right ovary was obscured.  No free fluid was seen.  An unsuccessful attempt at endometrial sampling took place.  The patient then returned for separate procedure performed on  February 20, 2018 with Cytotec ripening of the cervix and ultrasound guidance of the [place].  Despite this preparation Dr. cousins was unable to pass the [place] through the cervical loss into the endometrium.  The patient has a personal medical history of atrial fibrillation since college  age.  Pamela Davenport is undergoing cardiac ablation for bigeminy.  Pamela Davenport is no longer in atrial fibrillation Pamela Davenport had cardioversion for this.  Pamela Davenport does however have a history of a spontaneous DVT in her 21s.  Pamela Davenport was treated for 6 months with anticoagulant therapy however within a month of coming off anticoagulant therapy developed a second DVT.  Her first DVT was complicated by concurrent pulmonary embolism.  Since these DVTs Pamela Davenport has been on oral anticoagulant therapy with Xarelto.  Pamela Davenport has a history of cervical stenosis and back pain and is undergone 2 back surgeries in the past via posterior approach.  Pamela Davenport had a diagnostic laparoscopy in her 68s.  Pamela Davenport has had one prior vaginal delivery.  Pamela Davenport is never had an abnormal Pap smear and her most recent Pap smear was in 2019 and Pamela Davenport is HPV negative.  Pamela Davenport has a strong family history for malignancy.  Her sister died from stage IV uterine carcinosarcoma.   Pamela Davenport has 3 brothers with the prostate cancer diagnosis.  Her paternal grandfather had colon cancer in her paternal aunt has a history of non-Hodgkin's lymphoma and another paternal aunt has a history of pancreatic cancer.  MRI on 03/30/18 showed Uterus: Measures 12.1 x 8.1 x 9.3 cm (volume = 480 cm^3). Numerous fibroids, most of which are calcified, show no significant change. Largest fibroid in the left fundal region measures 5.7 cm and shows red degeneration, stable since prior CT. This fibroid shows a submucosal component displacing the endometrium. No abnormal endometrial thickening seen. Normal appearance of cervix and vagina..   Current Meds:  Outpatient Encounter Medications as of 03/21/2018  Medication Sig  . flecainide (TAMBOCOR) 150 MG tablet Take 2 tablets by mouth at the onset of AFIB  . hydrOXYzine (ATARAX/VISTARIL) 25 MG tablet Take 1 tablet (25 mg total) by mouth 3 (three) times daily as needed for itching.  . losartan (COZAAR) 25 MG tablet Take 1 tablet (25 mg total) by mouth daily. Please keep upcoming appt in  September for future refills. Thank you  . nadolol (CORGARD) 20 MG tablet Take 1 tablet (20 mg total) by mouth daily. Please keep upcoming appt in September for future refills. Thank you  . rivaroxaban (XARELTO) 20 MG TABS tablet TAKE 1 TABLET BY MOUTH DAILY WITH SUPPER.  . [DISCONTINUED] amoxicillin-clavulanate (AUGMENTIN) 875-125 MG tablet Take 1 tablet by mouth 2 (two) times daily. (Patient not taking: Reported on 03/21/2018)  . [DISCONTINUED] nitrofurantoin, macrocrystal-monohydrate, (MACROBID) 100 MG capsule Take 1 capsule (100 mg total) by mouth 2 (two) times daily. (Patient not taking: Reported on 03/21/2018)  . [DISCONTINUED] phenazopyridine (PYRIDIUM) 200 MG tablet Take 1 tablet (200 mg total) by mouth 3 (three) times daily as needed for pain. (Patient not taking: Reported on 03/21/2018)   No facility-administered encounter medications on file as of 03/21/2018.     Allergy:  Allergies  Allergen Reactions  . Methylprednisolone     REACTION: Atrial fibrillation at end of medrol dose pack  . Sulfa Antibiotics Hives  . Sulfonamide Derivatives Hives    Social Hx:   Social History   Socioeconomic History  . Marital status: Single    Spouse name: Not on file  . Number of children: Not on  file  . Years of education: Not on file  . Highest education level: Not on file  Occupational History  . Occupation: Nurse  Social Needs  . Financial resource strain: Not on file  . Food insecurity:    Worry: Not on file    Inability: Not on file  . Transportation needs:    Medical: Not on file    Non-medical: Not on file  Tobacco Use  . Smoking status: Former Research scientist (life sciences)  . Smokeless tobacco: Never Used  . Tobacco comment: Patient hasn't smoked in 35years  Substance and Sexual Activity  . Alcohol use: Yes    Comment: rarely  . Drug use: No  . Sexual activity: Not on file  Lifestyle  . Physical activity:    Days per week: Not on file    Minutes per session: Not on file  . Stress: Not on  file  Relationships  . Social connections:    Talks on phone: Not on file    Gets together: Not on file    Attends religious service: Not on file    Active member of club or organization: Not on file    Attends meetings of clubs or organizations: Not on file    Relationship status: Not on file  . Intimate partner violence:    Fear of current or ex partner: Not on file    Emotionally abused: Not on file    Physically abused: Not on file    Forced sexual activity: Not on file  Other Topics Concern  . Not on file  Social History Narrative   Works as a Marine scientist at [employer]    Past Surgical Hx:  Past Surgical History:  Procedure Laterality Date  . COLONOSCOPY W/ POLYPECTOMY    . Diagnostic D&C hysteroscopy and Novasure ablation  03/17/2004  . DILATION AND CURETTAGE OF UTERUS  12/17/2011   Procedure: DILATATION AND CURETTAGE;  Surgeon: Frederico Hamman, MD;  Location: [facility];  Service: Gynecology;  Laterality: N/A;  With Attempted Hydrothermal Ablation  . LUMBAR LAMINECTOMY/DECOMPRESSION MICRODISCECTOMY Left 02/21/2013   Procedure: LUMBAR LAMINECTOMY/DECOMPRESSION MICRODISCECTOMY 1 LEVEL;  Surgeon: Winfield Cunas, MD;  Location: [facility];  Service: Neurosurgery;  Laterality: Left;  LEFT Lumbar Three-Four Laminotomy foraminotomy microdiskectomy  . MANDIBLE SURGERY     under bite  . PVC Ablation  2010  . SPINE SURGERY    . TONSILLECTOMY    . UTERINE ARTERY EMBOLIZATION  2008   for uterine bleeding  . VASCULAR SURGERY     laser of left leg    Past Medical Hx:  Past Medical History:  Diagnosis Date  . Atypical chest pain    normal myoview 11/11/10  . Blood transfusion without reported diagnosis   . DVT (deep venous thrombosis) (HCC)    recurrent; chronically anticoagulated with coumadin  . Family history of anesthesia complication    Sister had decrease in respirations after surgery  . Heart murmur   . Herniated lumbar intervertebral disc   . Hypertension   .  Paroxysmal atrial fibrillation (HCC)    SINCE AGE 84   . PE (pulmonary embolism)    chronically anticoagulated with coumadin  . Pneumonia 07/20/12  . PONV (postoperative nausea and vomiting)   . Sickle cell trait ([place])   . Sinus congestion 08/14/2018   C/O NASAL/CHEST  CONGESTION, COUGHING WITH PHLEGM, PER PATIENT , Pamela Davenport IS TO BEGIN TAKING AUGMENTIN DOSE PACK TODAY;    . Strep throat at age 21  The patient reports a severe strep throat infection at age 51 and   is not clear as to whether or not Pamela Davenport may have had rheumatic fever.  . Symptomatic premature ventricular contractions    improved s/p ablation  . Urgency of urination   . Uterine bleeding 2008   Uterine artery embolization in 2008 for uterine bleeding.    Past Gynecological History:  SVD x 1, see HPI for remaining history. No abnormal paps. No LMP recorded. Patient has had an ablation.  Family Hx:  Family History  Problem Relation Age of Onset  . Hypertension Mother   . Diabetes Mother   . Uterine cancer Sister   . Prostate cancer Brother   . Prostate cancer Paternal Grandmother   . Colon cancer Paternal Grandfather   . Prostate cancer Brother   . Prostate cancer Brother     Review of Systems:  Constitutional  Feels well,    ENT Normal appearing ears and nares bilaterally Skin/Breast  No rash, sores, jaundice, itching, dryness Cardiovascular  No chest pain, shortness of breath, or edema  Pulmonary  No cough or wheeze.  Gastro Intestinal  No nausea, vomitting, or diarrhoea. No bright red blood per rectum, no abdominal pain, change in bowel movement, or constipation.  Genito Urinary  No frequency, urgency, dysuria, + postmenopausal bleeidng, + cramping pain Musculo Skeletal  No myalgia, arthralgia, joint swelling or pain  Neurologic  No weakness, numbness, change in gait,  Psychology  No depression, anxiety, insomnia.   Vitals:  Blood pressure (!) 151/80, pulse 88, temperature 98.5 F (36.9 C),  temperature source Oral, resp. rate 18, height 5\' 9"  (1.753 m), weight 258 lb (117 kg), SpO2 100 %.  Physical Exam: WD in NAD Neck  Supple NROM, without any enlargements.  Lymph Node Survey No cervical supraclavicular or inguinal adenopathy Cardiovascular  Pulse normal rate, regularity and rhythm. S1 and S2 normal.  Lungs  Clear to auscultation bilateraly, without wheezes/crackles/rhonchi. Good air movement.  Skin  No rash/lesions/breakdown  Psychiatry  Alert and oriented to person, place, and time  Abdomen  Normoactive bowel sounds, abdomen soft, non-tender and obese without evidence of hernia. Back No CVA tenderness Genito Urinary  Vulva/vagina: Normal external female genitalia.  No lesions. No discharge or bleeding.  Bladder/urethra:  No lesions or masses, well supported bladder  Vagina: grossly normal, some relaxation of upper vagina  Cervix: Normal appearing, no lesions.  Uterus: 12cm, bulky, mobile, no parametrial involvement or nodularity.  Adnexa: no discrete masses. Rectal  deferred  Extremities  No bilateral cyanosis, clubbing or edema.   Thereasa Solo, MD  08/21/2018, 9:13 AM

## 2018-08-22 DIAGNOSIS — N950 Postmenopausal bleeding: Secondary | ICD-10-CM | POA: Diagnosis not present

## 2018-08-22 DIAGNOSIS — Z888 Allergy status to other drugs, medicaments and biological substances status: Secondary | ICD-10-CM | POA: Diagnosis not present

## 2018-08-22 DIAGNOSIS — Z86718 Personal history of other venous thrombosis and embolism: Secondary | ICD-10-CM | POA: Diagnosis not present

## 2018-08-22 DIAGNOSIS — Z79899 Other long term (current) drug therapy: Secondary | ICD-10-CM | POA: Diagnosis not present

## 2018-08-22 DIAGNOSIS — Z882 Allergy status to sulfonamides status: Secondary | ICD-10-CM | POA: Diagnosis not present

## 2018-08-22 DIAGNOSIS — N83202 Unspecified ovarian cyst, left side: Secondary | ICD-10-CM | POA: Diagnosis not present

## 2018-08-22 DIAGNOSIS — N83201 Unspecified ovarian cyst, right side: Secondary | ICD-10-CM | POA: Diagnosis not present

## 2018-08-22 DIAGNOSIS — Z7901 Long term (current) use of anticoagulants: Secondary | ICD-10-CM | POA: Diagnosis not present

## 2018-08-22 DIAGNOSIS — D259 Leiomyoma of uterus, unspecified: Secondary | ICD-10-CM | POA: Diagnosis not present

## 2018-08-22 LAB — CBC
HCT: 35.1 % — ABNORMAL LOW (ref 36.0–46.0)
Hemoglobin: 11.1 g/dL — ABNORMAL LOW (ref 12.0–15.0)
MCH: 26.5 pg (ref 26.0–34.0)
MCHC: 31.6 g/dL (ref 30.0–36.0)
MCV: 83.8 fL (ref 80.0–100.0)
Platelets: 262 10*3/uL (ref 150–400)
RBC: 4.19 MIL/uL (ref 3.87–5.11)
RDW: 13.3 % (ref 11.5–15.5)
WBC: 15.3 10*3/uL — ABNORMAL HIGH (ref 4.0–10.5)
nRBC: 0 % (ref 0.0–0.2)

## 2018-08-22 LAB — BASIC METABOLIC PANEL
Anion gap: 9 (ref 5–15)
BUN: 11 mg/dL (ref 8–23)
CO2: 22 mmol/L (ref 22–32)
Calcium: 8.5 mg/dL — ABNORMAL LOW (ref 8.9–10.3)
Chloride: 103 mmol/L (ref 98–111)
Creatinine, Ser: 0.74 mg/dL (ref 0.44–1.00)
GFR calc Af Amer: >60 mL/min (ref >60)
GFR calc non Af Amer: >60 mL/min (ref >60)
Glucose, Bld: 121 mg/dL — ABNORMAL HIGH (ref 70–99)
Potassium: 4.2 mmol/L (ref 3.5–5.1)
Sodium: 134 mmol/L — ABNORMAL LOW (ref 135–145)

## 2018-08-22 NOTE — Progress Notes (Signed)
1 Day Post-Op Procedure(s) (LRB): XI ROBOTIC ASSISTED TOTAL HYSTERECTOMY WITH BILATERAL SALPINGO OOPHORECTOMY (Bilateral)  1532/1532-01  Past Medical History:  Diagnosis Date  . Atypical chest pain    normal myoview 11/11/10  . Blood transfusion without reported diagnosis   . DVT (deep venous thrombosis) (HCC)    recurrent; chronically anticoagulated with coumadin  . Family history of anesthesia complication    Sister had decrease in respirations after surgery  . Heart murmur   . Herniated lumbar intervertebral disc   . Hypertension   . Paroxysmal atrial fibrillation (HCC)    SINCE AGE 64   . PE (pulmonary embolism)    chronically anticoagulated with coumadin  . Pneumonia 07/20/12  . PONV (postoperative nausea and vomiting)   . Sickle cell trait ([place])   . Sinus congestion 08/14/2018   C/O NASAL/CHEST  CONGESTION, COUGHING WITH PHLEGM, PER PATIENT , SHE IS TO BEGIN TAKING AUGMENTIN DOSE PACK TODAY;    . Strep throat at age 72   The patient reports a severe strep throat infection at age 89 and   is not clear as to whether or not she may have had rheumatic fever.  . Symptomatic premature ventricular contractions    improved s/p ablation  . Urgency of urination   . Uterine bleeding 2008   Uterine artery embolization in 2008 for uterine bleeding.     Subjective: Patient reports feeling well. Pain controlled. Has been up to bathroom voiding. Tolerating po..    Objective: Vital signs in last 24 hours: Temp:  [97.2 F (36.2 C)-99 F (37.2 C)] 98.7 F (37.1 C) (12/24 0547) Pulse Rate:  [62-88] 81 (12/24 0547) Resp:  [12-21] 19 (12/24 0547) BP: (138-170)/(63-104) 138/63 (12/24 0547) SpO2:  [94 %-100 %] 94 % (12/24 0547) Weight:  [258 lb (117 kg)-259 lb 14.8 oz (117.9 kg)] 259 lb 14.8 oz (117.9 kg) (12/24 0547) Last BM Date: 08/21/18  Intake/Output from previous day: 12/23 0701 - 12/24 0700 In: 1744.1 [P.O.:350; I.V.:1294.1; IV Piggyback:100] Out: 1875 [Urine:1850;  Blood:25] No data found.   Physical Examination: General: alert, cooperative and no distress GI: soft, NT, wounds CDI Extremities: extremities normal, atraumatic, no cyanosis or edema  Labs: WBC/Hgb/Hct/Plts:  15.3/11.1/35.1/262 (12/24 0443) BUN/Cr/glu/ALT/AST/amyl/lip:  11/0.74/--/--/--/--/-- (12/24 0443)  CBC Latest Ref Rng & Units 08/22/2018 08/14/2018 03/30/2017  WBC 4.0 - 10.5 K/uL 15.3(H) 8.5 6.2  Hemoglobin 12.0 - 15.0 g/dL 11.1(L) 13.2 12.4  Hematocrit 36.0 - 46.0 % 35.1(L) 42.5 36.6  Platelets 150 - 400 K/uL 262 283 258   BMP Latest Ref Rng & Units 08/22/2018 08/14/2018 03/30/2018  Glucose 70 - 99 mg/dL 121(H) 100(H) -  BUN 8 - 23 mg/dL 11 15 -  Creatinine 0.44 - 1.00 mg/dL 0.74 0.84 0.60  BUN/Creat Ratio 12 - 28 - - -  Sodium 135 - 145 mmol/L 134(L) 139 -  Potassium 3.5 - 5.1 mmol/L 4.2 4.4 -  Chloride 98 - 111 mmol/L 103 105 -  CO2 22 - 32 mmol/L 22 24 -  Calcium 8.9 - 10.3 mg/dL 8.5(L) 9.2 -      Assessment/Plan:  62 y.o. s/p Procedure(s): XI ROBOTIC ASSISTED TOTAL HYSTERECTOMY WITH BILATERAL SALPINGO OOPHORECTOMY: stable  Pain:  Pain is controlled on IV and/or oral medications.  Neuro: no issues  Pulm: no issues. IS at bedside  Heme: H/H acceptable level for postop  CV:  Some elevated BP. Home meds to restart.  Prophylaxis: therapeutic lovenox .  GI:    . Tolerating po: Yes  GU: . adequate urine output  FEN:  . IVF dc this am . Elec slight hyponatremia . Diet tolerating   Endo: no issues  ID: afebrile. Slight elevated wbc likely acute/stress reaction  Gyn: post hysterectomy. No bleeding per vagina. We discussed the small laceration and repair in that area  planning dispo today with therapeutic Lovenox x 1 week then change to oral regimen  Consults:   The patient is to be discharged to home.   LOS: 0 days    [name] 08/22/2018, 7:22 AM

## 2018-08-22 NOTE — Discharge Summary (Signed)
Physician Discharge Summary  Patient ID: Pamela Davenport MRN: [id number] DOB/AGE: 1956/07/24 62 y.o.  Admit date: 08/21/2018 Discharge date: 08/22/2018  Admission Diagnoses: Fibroid uterus  Discharge Diagnoses:  Principal Problem:   Fibroid uterus Active Problems:   Chronic pulmonary embolism (HCC)   Postmenopausal bleeding   Fibroids   Discharged Condition: good  Hospital Course:  1/ patient was admitted on Robotic Hyst/BSO for Fibroids and bleeding 2/ surgery was uncomplicated  3/ on postoperative day 1 the patient was meeting discharge criteria: tolerating PO, voiding urine, ambulating, pain well controlled on oral medications 4/ new medications on discharge include none (patient given prescriptions preop).    Consults: None   Discharge Exam: Blood pressure (!) 150/67, pulse 87, temperature 98.7 F (37.1 C), temperature source Oral, resp. rate 18, height 5\' 9"  (1.753 m), weight 259 lb 14.8 oz (117.9 kg), SpO2 96 %. General appearance: alert and no distress Resp: no respiratory distress GI: soft NTND. Wounds CDI Extremities: extremities normal, atraumatic, no cyanosis or edema  Disposition: Discharge disposition: 01-Home or Self Care       Discharge Instructions    Diet - low sodium heart healthy   Complete by:  As directed    Discharge instructions   Complete by:  As directed    Use your Lovenox as discussed with Dr. Serita Grit office for one week then start Eliquis as previously prescribed on the 8th day and stop the Lovenox at that time.   Increase activity slowly   Complete by:  As directed      Allergies as of 08/22/2018      Reactions   Methylprednisolone    REACTION: Atrial fibrillation at end of medrol dose pack   Sulfa Antibiotics Hives   Sulfonamide Derivatives Hives      Medication List    STOP taking these medications   amoxicillin-clavulanate 875-125 MG tablet Commonly known as:  AUGMENTIN   predniSONE 20 MG tablet Commonly known as:   DELTASONE     TAKE these medications   enoxaparin 120 MG/0.8ML injection Commonly known as:  LOVENOX Inject 0.79 mLs (120 mg total) into the skin every 12 (twelve) hours. Do not take evening before surgery or morning of surgery   flecainide 150 MG tablet Commonly known as:  TAMBOCOR Take 2 tablets by mouth at the onset of AFIB What changed:    how much to take  how to take this  when to take this  reasons to take this  additional instructions   HYDROcodone-acetaminophen 5-325 MG tablet Commonly known as:  NORCO/VICODIN Take 1-2 tablets by mouth every 6 (six) hours as needed for severe pain. To be taken after surgery   hydrOXYzine 25 MG tablet Commonly known as:  ATARAX/VISTARIL Take 1 tablet (25 mg total) by mouth 3 (three) times daily as needed for itching.   ibuprofen 600 MG tablet Commonly known as:  ADVIL,MOTRIN Take 1 tablet (600 mg total) by mouth every 6 (six) hours as needed for moderate pain.   losartan 25 MG tablet Commonly known as:  COZAAR Take 1 tablet (25 mg total) by mouth daily.   nadolol 20 MG tablet Commonly known as:  CORGARD Take 1 tablet (20 mg total) by mouth daily.   rivaroxaban 20 MG Tabs tablet Commonly known as:  XARELTO TAKE 1 TABLET BY MOUTH DAILY WITH SUPPER. What changed:    how much to take  how to take this  when to take this   senna-docusate 8.6-50 MG tablet Commonly known  as:  Senokot-S Take 2 tablets by mouth at bedtime. To begin after surgery. Hold for loose stools      Follow-up Information    Everitt Amber, MD In 3 weeks.   Specialty:  Gynecologic Oncology Contact information: [place] [address] [phone]           Signed: [name] 08/22/2018, 11:21 AM

## 2018-08-25 ENCOUNTER — Encounter (HOSPITAL_BASED_OUTPATIENT_CLINIC_OR_DEPARTMENT_OTHER): Payer: Self-pay | Admitting: Gynecologic Oncology

## 2018-08-28 ENCOUNTER — Telehealth: Payer: Self-pay | Admitting: *Deleted

## 2018-08-28 NOTE — Telephone Encounter (Signed)
I called the patient to inform her of her surgical pathology results from 08/21/18.  Per Joylene John, NP no cancer on the final path.  Patient states that she is doing good post-operatively, patient states "still just sore from the surgery.  Patient states that she is able to urinate,and have a bowel movement and she is able to move around.  Patient ask about the time of her follow-up appointment.  I told the patient that her follow-up appointment is September 13, 2017 at 2:15p.m.  Patient verbalized understanding.

## 2018-09-08 MED FILL — NADOLOL 20 MG TAB: 20 | 90 days supply | Qty: 90 | Fill #1

## 2018-09-08 MED FILL — LOSARTAN POTASSIUM 25 MG TA: 25 | 30 days supply | Qty: 30 | Fill #3

## 2018-09-08 MED FILL — XARELTO 20 MG TABLET: 20 | 90 days supply | Qty: 90 | Fill #1

## 2018-09-13 ENCOUNTER — Inpatient Hospital Stay: Payer: 59 | Attending: Gynecologic Oncology | Admitting: Gynecologic Oncology

## 2018-09-13 ENCOUNTER — Encounter: Payer: Self-pay | Admitting: Gynecologic Oncology

## 2018-09-13 VITALS — BP 145/78 | HR 78 | Temp 97.8°F | Resp 18 | Ht 69.0 in | Wt 257.0 lb

## 2018-09-13 DIAGNOSIS — Z90710 Acquired absence of both cervix and uterus: Secondary | ICD-10-CM | POA: Insufficient documentation

## 2018-09-13 DIAGNOSIS — N950 Postmenopausal bleeding: Secondary | ICD-10-CM | POA: Diagnosis not present

## 2018-09-13 DIAGNOSIS — Z90722 Acquired absence of ovaries, bilateral: Secondary | ICD-10-CM | POA: Diagnosis not present

## 2018-09-13 DIAGNOSIS — Z87891 Personal history of nicotine dependence: Secondary | ICD-10-CM | POA: Insufficient documentation

## 2018-09-13 DIAGNOSIS — D219 Benign neoplasm of connective and other soft tissue, unspecified: Secondary | ICD-10-CM

## 2018-09-13 DIAGNOSIS — D251 Intramural leiomyoma of uterus: Secondary | ICD-10-CM | POA: Insufficient documentation

## 2018-09-13 NOTE — Patient Instructions (Addendum)
Dr Denman George thanks you for your incredible generosity and for trusting her to care for you.  You are cleared to return to work 6 weeks from your surgery.  Please reach out to her at [phone] with any questions or concerns that you might have.   It is normal to have some light pink spotting.   Pap smears are no longer necessary if you have a history of normal pap smears in the past 20 years.  Annual wellness visits can take place with your primary care doctor or OBGYN.

## 2018-09-13 NOTE — Progress Notes (Signed)
Follow-up Note: Gyn-Onc  Consult was requested by Dr. Garwin Brothers for the evaluation of Pamela Davenport 63 y.o. female  CC:  Chief Complaint  Patient presents with  . Postmenopausal bleeding    Assessment/Plan:  3 weeks s/p uncomplicated robotic [id number], BSO.  Doing well postop. Will resume wellness visits with Dr Garwin Brothers and her PCP as needed.   HPI: Pamela Davenport is a 63 year old P1 who is seen in consultation at the request of Dr Garwin Brothers for postmenopausal bleeding, endometrial fluid collection, fibroids and cervical stenosis.   I know Pamela Davenport well because she is the sister of my former patient who died from carcinosarcoma in early 2019.  Pamela. Davenport has a long-standing history of known uterine fibroids and abnormal uterine bleeding.  She was treated remotely for this with an endometrial ablation in her 63s, however continued to have bleeding and therefore was treated with uterine artery embolization.  She has had intermittent occasional vaginal spotting ever since these procedures but this was not initially bothersome to her.  In May 2019 she developed an episode of very light pink-tinged vaginal spotting after intercourse.  It went away within the course of a day.  She also has intermittent crampy abdominal pain from her fibroids.  She discussed this episode of bleeding with Dr. cousins who performed a transvaginal ultrasound scan on February 10, 2018 which revealed an enlarged uterus measuring 12.1 x 8.5 x 7.2 cm with a thickened endometrium measuring 3.5 mm anterior and 4.6 mm posteriorly but with complex fluid visualized within the cavity.  The largest fibroid within the myometrium measured 6.5 x 4 5.6 x 4.9 cm.  The left ovary was grossly normal the right ovary was obscured.  No free fluid was seen.  An unsuccessful attempt at endometrial sampling took place.  The patient then returned for separate procedure performed on February 20, 2018 with Cytotec ripening of the cervix and ultrasound  guidance of the [place].  Despite this preparation Dr. cousins was unable to pass the [place] through the cervical loss into the endometrium.  The patient has a personal medical history of atrial fibrillation since college age.  She is undergoing cardiac ablation for bigeminy.  She is no longer in atrial fibrillation she had cardioversion for this.  She does however have a history of a spontaneous DVT in her 63s.  She was treated for 6 months with anticoagulant therapy however within a month of coming off anticoagulant therapy developed a second DVT.  Her first DVT was complicated by concurrent pulmonary embolism.  Since these DVTs she has been on oral anticoagulant therapy with Xarelto.  She has a history of cervical stenosis and back pain and is undergone 2 back surgeries in the past via posterior approach.  She had a diagnostic laparoscopy in her 41s.  She has had one prior vaginal delivery.  She is never had an abnormal Pap smear and her most recent Pap smear was in 2019 and she is HPV negative.  She has a strong family history for malignancy.  Her sister died from stage IV uterine carcinosarcoma.   she has 3 brothers with the prostate cancer diagnosis.  Her paternal grandfather had colon cancer in her paternal aunt has a history of non-Hodgkin's lymphoma and another paternal aunt has a history of pancreatic cancer.  Interval hx: August 21, 2018 she underwent robotic assisted total hysterectomy for uterus greater than 250 g with bilateral salpingo-oophorectomy.  Frozen section confirmed benign histology and final pathology also confirmed  benign endometrial polyps, intramural fibroids, and benign bilateral ovaries and fallopian tubes and cervix.  She did well postoperatively and resumed her anticoagulation therapy immediately postoperatively.  Current Meds:  Outpatient Encounter Medications as of 09/13/2018  Medication Sig  . flecainide (TAMBOCOR) 150 MG tablet Take 2 tablets by mouth at the onset  of AFIB (Patient taking differently: Take 300 mg by mouth daily as needed (AFIB). )  . hydrOXYzine (ATARAX/VISTARIL) 25 MG tablet Take 1 tablet (25 mg total) by mouth 3 (three) times daily as needed for itching.  Marland Kitchen ibuprofen (ADVIL,MOTRIN) 600 MG tablet Take 1 tablet (600 mg total) by mouth every 6 (six) hours as needed for moderate pain.  Marland Kitchen losartan (COZAAR) 25 MG tablet Take 1 tablet (25 mg total) by mouth daily.  . nadolol (CORGARD) 20 MG tablet Take 1 tablet (20 mg total) by mouth daily.  . rivaroxaban (XARELTO) 20 MG TABS tablet TAKE 1 TABLET BY MOUTH DAILY WITH SUPPER. (Patient taking differently: Take 20 mg by mouth daily with supper. TAKE 1 TABLET BY MOUTH DAILY WITH SUPPER.)  . senna-docusate (SENOKOT-S) 8.6-50 MG tablet Take 2 tablets by mouth at bedtime. To begin after surgery. Hold for loose stools (Patient not taking: Reported on 09/13/2018)  . [DISCONTINUED] enoxaparin (LOVENOX) 120 MG/0.8ML injection Inject 0.79 mLs (120 mg total) into the skin every 12 (twelve) hours. Do not take evening before surgery or morning of surgery (Patient not taking: Reported on 09/13/2018)  . [DISCONTINUED] HYDROcodone-acetaminophen (NORCO/VICODIN) 5-325 MG tablet Take 1-2 tablets by mouth every 6 (six) hours as needed for severe pain. To be taken after surgery (Patient not taking: Reported on 09/13/2018)   No facility-administered encounter medications on file as of 09/13/2018.     Allergy:  Allergies  Allergen Reactions  . Methylprednisolone     REACTION: Atrial fibrillation at end of medrol dose pack  . Sulfa Antibiotics Hives  . Sulfonamide Derivatives Hives    Social Hx:   Social History   Socioeconomic History  . Marital status: Single    Spouse name: Not on file  . Number of children: Not on file  . Years of education: Not on file  . Highest education level: Not on file  Occupational History  . Occupation: Nurse  Social Needs  . Financial resource strain: Not on file  . Food  insecurity:    Worry: Not on file    Inability: Not on file  . Transportation needs:    Medical: Not on file    Non-medical: Not on file  Tobacco Use  . Smoking status: Former Research scientist (life sciences)  . Smokeless tobacco: Never Used  . Tobacco comment: Patient hasn't smoked in 35years  Substance and Sexual Activity  . Alcohol use: Yes    Comment: rarely  . Drug use: No  . Sexual activity: Not on file  Lifestyle  . Physical activity:    Days per week: Not on file    Minutes per session: Not on file  . Stress: Not on file  Relationships  . Social connections:    Talks on phone: Not on file    Gets together: Not on file    Attends religious service: Not on file    Active member of club or organization: Not on file    Attends meetings of clubs or organizations: Not on file    Relationship status: Not on file  . Intimate partner violence:    Fear of current or ex partner: Not on file    Emotionally  abused: Not on file    Physically abused: Not on file    Forced sexual activity: Not on file  Other Topics Concern  . Not on file  Social History Narrative   Works as a Marine scientist at [employer]    Past Surgical Hx:  Past Surgical History:  Procedure Laterality Date  . COLONOSCOPY W/ POLYPECTOMY    . Diagnostic D&C hysteroscopy and Novasure ablation  03/17/2004  . DILATION AND CURETTAGE OF UTERUS  12/17/2011   Procedure: DILATATION AND CURETTAGE;  Surgeon: Frederico Hamman, MD;  Location: [facility];  Service: Gynecology;  Laterality: N/A;  With Attempted Hydrothermal Ablation  . LUMBAR LAMINECTOMY/DECOMPRESSION MICRODISCECTOMY Left 02/21/2013   Procedure: LUMBAR LAMINECTOMY/DECOMPRESSION MICRODISCECTOMY 1 LEVEL;  Surgeon: Winfield Cunas, MD;  Location: [facility];  Service: Neurosurgery;  Laterality: Left;  LEFT Lumbar Three-Four Laminotomy foraminotomy microdiskectomy  . MANDIBLE SURGERY     under bite  . PVC Ablation  2010  . ROBOTIC ASSISTED TOTAL HYSTERECTOMY WITH BILATERAL SALPINGO  OOPHERECTOMY Bilateral 08/21/2018   Procedure: XI ROBOTIC ASSISTED TOTAL HYSTERECTOMY WITH BILATERAL SALPINGO OOPHORECTOMY;  Surgeon: Everitt Amber, MD;  Location: [facility];  Service: Gynecology;  Laterality: Bilateral;  . SPINE SURGERY    . TONSILLECTOMY    . UTERINE ARTERY EMBOLIZATION  2008   for uterine bleeding  . VASCULAR SURGERY     laser of left leg    Past Medical Hx:  Past Medical History:  Diagnosis Date  . Atypical chest pain    normal myoview 11/11/10  . Blood transfusion without reported diagnosis   . DVT (deep venous thrombosis) (HCC)    recurrent; chronically anticoagulated with coumadin  . Family history of anesthesia complication    Sister had decrease in respirations after surgery  . Heart murmur   . Herniated lumbar intervertebral disc   . Hypertension   . Paroxysmal atrial fibrillation (HCC)    SINCE AGE 59   . PE (pulmonary embolism)    chronically anticoagulated with coumadin  . Pneumonia 07/20/12  . PONV (postoperative nausea and vomiting)   . Sickle cell trait ([place])   . Sinus congestion 08/14/2018   C/O NASAL/CHEST  CONGESTION, COUGHING WITH PHLEGM, PER PATIENT , SHE IS TO BEGIN TAKING AUGMENTIN DOSE PACK TODAY;    . Strep throat at age 65   The patient reports a severe strep throat infection at age 37 and   is not clear as to whether or not she may have had rheumatic fever.  . Symptomatic premature ventricular contractions    improved s/p ablation  . Urgency of urination   . Uterine bleeding 2008   Uterine artery embolization in 2008 for uterine bleeding.    Past Gynecological History:  SVD x 1, see HPI for remaining history. No abnormal paps. No LMP recorded. Patient has had an ablation.  Family Hx:  Family History  Problem Relation Age of Onset  . Hypertension Mother   . Diabetes Mother   . Uterine cancer Sister   . Prostate cancer Brother   . Prostate cancer Paternal Grandmother   . Colon cancer Paternal Grandfather   .  Prostate cancer Brother   . Prostate cancer Brother     Review of Systems:  Constitutional  Feels well,    ENT Normal appearing ears and nares bilaterally Skin/Breast  No rash, sores, jaundice, itching, dryness Cardiovascular  No chest pain, shortness of breath, or edema  Pulmonary  No cough or wheeze.  Gastro Intestinal  No nausea, vomitting, or diarrhoea. No bright red blood per rectum, no abdominal pain, change in bowel movement, or constipation.  Genito Urinary  No frequency, urgency, dysuria, pink discharge Musculo Skeletal  No myalgia, arthralgia, joint swelling or pain  Neurologic  No weakness, numbness, change in gait,  Psychology  No depression, anxiety, insomnia.   Vitals:  Blood pressure (!) 145/78, pulse 78, temperature 97.8 F (36.6 C), temperature source Oral, resp. rate 18, height 5\' 9"  (1.753 m), weight 257 lb (116.6 kg), SpO2 100 %.  Physical Exam: WD in NAD Neck  Supple NROM, without any enlargements.  Lymph Node Survey No cervical supraclavicular or inguinal adenopathy Cardiovascular  Pulse normal rate, regularity and rhythm. S1 and S2 normal.  Lungs  Clear to auscultation bilateraly, without wheezes/crackles/rhonchi. Good air movement.  Skin  No rash/lesions/breakdown  Psychiatry  Alert and oriented to person, place, and time  Abdomen  Normoactive bowel sounds, abdomen soft, non-tender and obese without evidence of hernia.Well healed incisions.  Back No CVA tenderness Genito Urinary  Vaginal cuff in tact, no bleeding Rectal  deferred  Extremities  No bilateral cyanosis, clubbing or edema.   Thereasa Solo, MD  09/13/2018, 3:10 PM

## 2018-09-25 ENCOUNTER — Telehealth: Payer: Self-pay

## 2018-09-25 NOTE — Telephone Encounter (Signed)
Outgoing call to patient regarding she wanted to know if her FMLA paper work had been completed.  Per Lenna Sciara NP - FMLA papers were faxed on Friday.  Pt voiced understanding.  No other needs per pt at this time. She reports she is doing well.

## 2018-10-11 MED FILL — LOSARTAN POTASSIUM 25 MG TA: 25 | 30 days supply | Qty: 30 | Fill #4

## 2018-11-10 MED FILL — LOSARTAN POTASSIUM 25 MG TA: 25 | 30 days supply | Qty: 30 | Fill #5

## 2018-12-06 ENCOUNTER — Encounter: Payer: Self-pay | Admitting: Internal Medicine

## 2018-12-06 ENCOUNTER — Ambulatory Visit (INDEPENDENT_AMBULATORY_CARE_PROVIDER_SITE_OTHER): Payer: 59 | Admitting: Internal Medicine

## 2018-12-06 DIAGNOSIS — Z0000 Encounter for general adult medical examination without abnormal findings: Secondary | ICD-10-CM

## 2018-12-06 DIAGNOSIS — M2550 Pain in unspecified joint: Secondary | ICD-10-CM | POA: Diagnosis not present

## 2018-12-06 DIAGNOSIS — I10 Essential (primary) hypertension: Secondary | ICD-10-CM | POA: Diagnosis not present

## 2018-12-06 DIAGNOSIS — M659 Synovitis and tenosynovitis, unspecified: Secondary | ICD-10-CM | POA: Diagnosis not present

## 2018-12-06 DIAGNOSIS — M65949 Unspecified synovitis and tenosynovitis, unspecified hand: Secondary | ICD-10-CM | POA: Insufficient documentation

## 2018-12-06 NOTE — Progress Notes (Signed)
Virtual Visit via Video Note  I connected with Pamela Davenport on 12/06/18 at  4:00 PM EDT by a video enabled telemedicine application and verified that I am speaking with the correct person using two identifiers.   I discussed the limitations of evaluation and management by telemedicine and the availability of in person appointments. The patient expressed understanding and agreed to proceed.  History of Present Illness: She checked in for virtual visit.  She was not willing to come in because of the coronavirus pandemic.  She complained of a 1 year history of pain and swelling in her hands, worse on the right than the left.  She tells me she has been seeing a Copy who has been injecting her knuckles with steroids which helps but only temporarily.  She says she was also seen in the last year at an urgent care center and took a course of oral steroids which helped some.  For the last few months the symptoms have been recurring and worsening.  She also complains of arthralgias in her left knee, her left foot, and shoulders.  She takes Xarelto so she sparingly takes Motrin which helps.  She has tried Tylenol which does not help with the pain or swelling.  She denies rash, chest pain, weight loss, fatigue, shortness of breath, weight changes, or diarrhea.    Observations/Objective: On the virtual visit she showed me her hands.  There appears to be swelling in the PIP joints and limited range of motion in the PIP and DIP joints and mild dactylitis.  She was alert, oriented, and in no acute distress.   Assessment and Plan: I have ordered a panel of labs to see if there is connective tissue disorder, gout, inflammation, infection.  I advised her to be cautious with ibuprofen since she is anticoagulated.  I considered prescribing Celebrex but she is allergic to sulfa and sulfonamides so Celebrex is contraindicated.  She will apply warm compresses and take Tylenol as needed.   Follow Up Instructions:  She will let me know if she develops any new or worsening symptoms.  I will relay the results of her lab work to her.    I discussed the assessment and treatment plan with the patient. The patient was provided an opportunity to ask questions and all were answered. The patient agreed with the plan and demonstrated an understanding of the instructions.   The patient was advised to call back or seek an in-person evaluation if the symptoms worsen or if the condition fails to improve as anticipated.  I provided 25 minutes of non-face-to-face time during this encounter.   Scarlette Calico, MD

## 2018-12-07 ENCOUNTER — Encounter: Payer: Self-pay | Admitting: Internal Medicine

## 2018-12-07 ENCOUNTER — Other Ambulatory Visit (INDEPENDENT_AMBULATORY_CARE_PROVIDER_SITE_OTHER): Payer: 59

## 2018-12-07 ENCOUNTER — Other Ambulatory Visit: Payer: Self-pay | Admitting: Internal Medicine

## 2018-12-07 DIAGNOSIS — D539 Nutritional anemia, unspecified: Secondary | ICD-10-CM | POA: Diagnosis not present

## 2018-12-07 DIAGNOSIS — M659 Synovitis and tenosynovitis, unspecified: Secondary | ICD-10-CM | POA: Diagnosis not present

## 2018-12-07 DIAGNOSIS — Z0000 Encounter for general adult medical examination without abnormal findings: Secondary | ICD-10-CM | POA: Diagnosis not present

## 2018-12-07 DIAGNOSIS — D509 Iron deficiency anemia, unspecified: Secondary | ICD-10-CM | POA: Insufficient documentation

## 2018-12-07 DIAGNOSIS — D513 Other dietary vitamin B12 deficiency anemia: Secondary | ICD-10-CM | POA: Insufficient documentation

## 2018-12-07 DIAGNOSIS — I10 Essential (primary) hypertension: Secondary | ICD-10-CM | POA: Diagnosis not present

## 2018-12-07 DIAGNOSIS — M2550 Pain in unspecified joint: Secondary | ICD-10-CM

## 2018-12-07 LAB — C-REACTIVE PROTEIN: CRP: 2.3 mg/dL (ref 0.5–20.0)

## 2018-12-07 LAB — BASIC METABOLIC PANEL
BUN: 11 mg/dL (ref 6–23)
CO2: 23 mEq/L (ref 19–32)
Calcium: 8.7 mg/dL (ref 8.4–10.5)
Chloride: 106 mEq/L (ref 96–112)
Creatinine, Ser: 0.79 mg/dL (ref 0.40–1.20)
GFR: 89.04 mL/min (ref >60.00)
Glucose, Bld: 97 mg/dL (ref 70–99)
Potassium: 3.7 mEq/L (ref 3.5–5.1)
Sodium: 138 mEq/L (ref 135–145)

## 2018-12-07 LAB — CBC WITH DIFFERENTIAL/PLATELET
Basophils Absolute: 0.1 10*3/uL (ref 0.0–0.1)
Basophils Relative: 0.7 % (ref 0.0–3.0)
Eosinophils Absolute: 0.2 10*3/uL (ref 0.0–0.7)
Eosinophils Relative: 2.7 % (ref 0.0–5.0)
HCT: 34.9 % — ABNORMAL LOW (ref 36.0–46.0)
Hemoglobin: 11.5 g/dL — ABNORMAL LOW (ref 12.0–15.0)
Lymphocytes Relative: 21.7 % (ref 12.0–46.0)
Lymphs Abs: 1.7 10*3/uL (ref 0.7–4.0)
MCHC: 33.1 g/dL (ref 30.0–36.0)
MCV: 80.8 fl (ref 78.0–100.0)
Monocytes Absolute: 0.7 10*3/uL (ref 0.1–1.0)
Monocytes Relative: 8.7 % (ref 3.0–12.0)
Neutro Abs: 5.3 10*3/uL (ref 1.4–7.7)
Neutrophils Relative %: 66.2 % (ref 43.0–77.0)
Platelets: 243 10*3/uL (ref 150.0–400.0)
RBC: 4.32 Mil/uL (ref 3.87–5.11)
RDW: 13.9 % (ref 11.5–15.5)
WBC: 8.1 10*3/uL (ref 4.0–10.5)

## 2018-12-07 LAB — FOLATE: Folate: 14.7 ng/mL (ref >5.9)

## 2018-12-07 LAB — SEDIMENTATION RATE: Sed Rate: 45 mm/hr — ABNORMAL HIGH (ref 0–30)

## 2018-12-07 LAB — URIC ACID: Uric Acid, Serum: 5.4 mg/dL (ref 2.4–7.0)

## 2018-12-07 LAB — IBC PANEL
Iron: 31 ug/dL — ABNORMAL LOW (ref 42–145)
Saturation Ratios: 11.5 % — ABNORMAL LOW (ref 20.0–50.0)
Transferrin: 192 mg/dL — ABNORMAL LOW (ref 212.0–360.0)

## 2018-12-07 LAB — FERRITIN: Ferritin: 44.9 ng/mL (ref 10.0–291.0)

## 2018-12-07 LAB — VITAMIN B12: Vitamin B-12: 205 pg/mL — ABNORMAL LOW (ref 211–911)

## 2018-12-07 MED ORDER — CYANOCOBALAMIN 2000 MCG PO TABS: 2000.0000 ug | ORAL_TABLET | Freq: Every day | ORAL | 1 refills | Status: DC

## 2018-12-07 MED ORDER — FERROUS SULFATE 325 (65 FE) MG PO TABS: 325.0000 mg | ORAL_TABLET | Freq: Two times a day (BID) | ORAL | 1 refills | Status: DC

## 2018-12-08 LAB — ANTI-NUCLEAR AB-TITER (ANA TITER): ANA Titer 1: 1:320 {titer} — ABNORMAL HIGH

## 2018-12-08 LAB — HEPATITIS C ANTIBODY
Hepatitis C Ab: NONREACTIVE
SIGNAL TO CUT-OFF: 0.02 (ref <1.00)

## 2018-12-08 LAB — CYCLIC CITRUL PEPTIDE ANTIBODY, IGG: Cyclic Citrullin Peptide Ab: >250 UNITS — ABNORMAL HIGH

## 2018-12-08 LAB — RHEUMATOID FACTOR: Rheumatoid fact SerPl-aCnc: 74 IU/mL — ABNORMAL HIGH (ref <14)

## 2018-12-08 LAB — HIV ANTIBODY (ROUTINE TESTING W REFLEX): HIV 1&2 Ab, 4th Generation: NONREACTIVE

## 2018-12-08 LAB — ANA: Anti Nuclear Antibody (ANA): POSITIVE — AB

## 2018-12-10 ENCOUNTER — Encounter: Payer: Self-pay | Admitting: Internal Medicine

## 2018-12-10 ENCOUNTER — Other Ambulatory Visit: Payer: Self-pay | Admitting: Internal Medicine

## 2018-12-10 DIAGNOSIS — M2550 Pain in unspecified joint: Secondary | ICD-10-CM

## 2018-12-10 DIAGNOSIS — M659 Synovitis and tenosynovitis, unspecified: Secondary | ICD-10-CM

## 2018-12-11 LAB — RETICULOCYTES
ABS Retic: 59220 cells/uL (ref 20000–8000)
Retic Ct Pct: 1.4 %

## 2018-12-11 LAB — VITAMIN B1: Vitamin B1 (Thiamine): 12 nmol/L (ref 8–30)

## 2018-12-13 MED FILL — NADOLOL 20 MG TAB: 20 | 90 days supply | Qty: 90 | Fill #2

## 2018-12-13 MED FILL — XARELTO 20 MG TABLET: 20 | 90 days supply | Qty: 90 | Fill #2

## 2018-12-13 MED FILL — LOSARTAN POTASSIUM 25 MG TA: 25 | 30 days supply | Qty: 30 | Fill #6

## 2018-12-13 NOTE — Telephone Encounter (Signed)
Referral was sent to [facility] yesterday. They will review notes and contact her if accepted. If not, they will let me know and I will send elsewhere.

## 2018-12-18 IMAGING — MR MR PELVIS WO/W CM
9 of 14 series · 19 of 48 positions shown · IV contrast (multihance)
Comparison: CT on 08/28/2015

CLINICAL DATA: Postmenopausal bleeding. Fibroids, with prior
uterine artery embolization.

EXAM:
MRI PELVIS WITHOUT AND WITH CONTRAST
TECHNIQUE: Multiplanar multisequence MR imaging of the pelvis was performed
both before and after administration of intravenous contrast.
CONTRAST:  20mL MULTIHANCE GADOBENATE DIMEGLUMINE 529 MG/ML IV SOLN

[Series 4: T2 · coronal · 6.0mm · 0.78mm/px · 3 of 34 slices shown (1 of 4)]
[im 1/34]
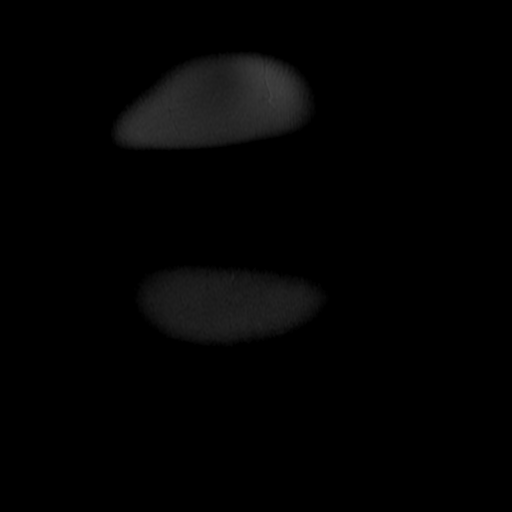
[im 17/34]
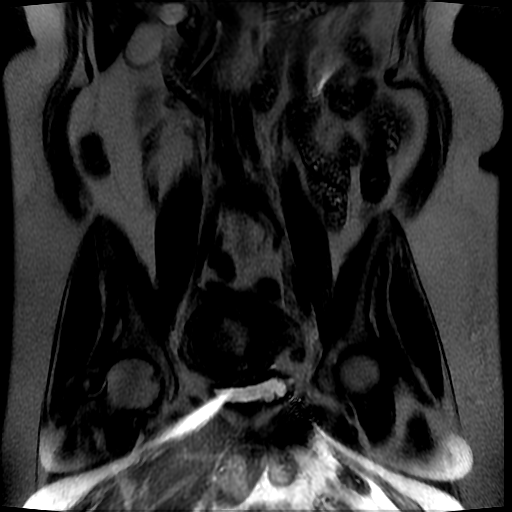
[im 34/34]
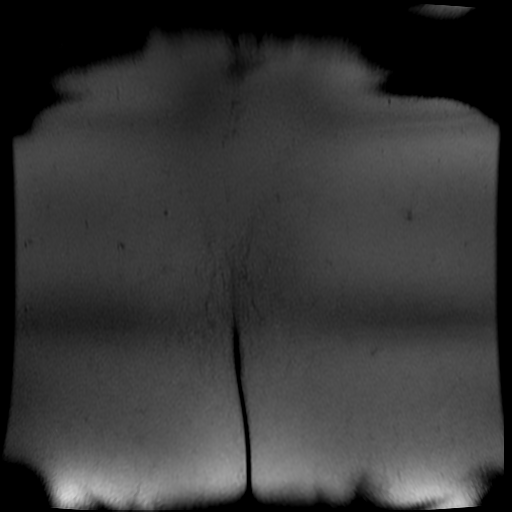

[Series 5: T2 · coronal · 5.0mm · 0.47mm/px · 3 of 34 slices shown (2 of 4)]
[im 1/34]
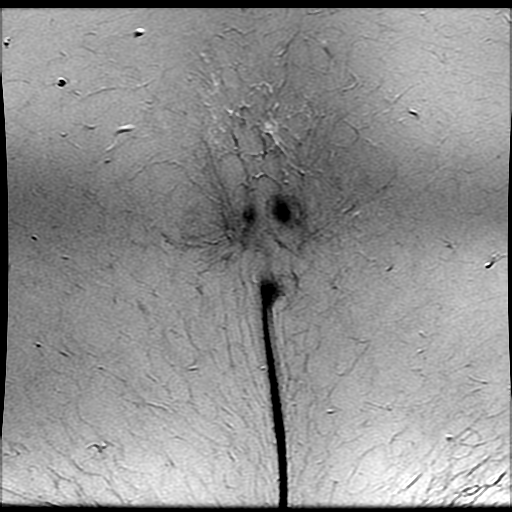
[im 17/34]
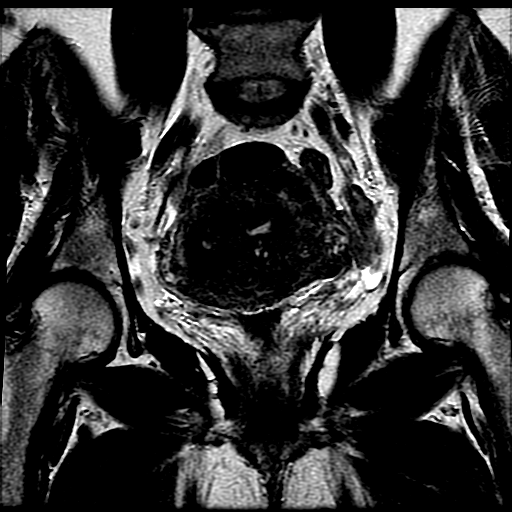
[im 34/34]
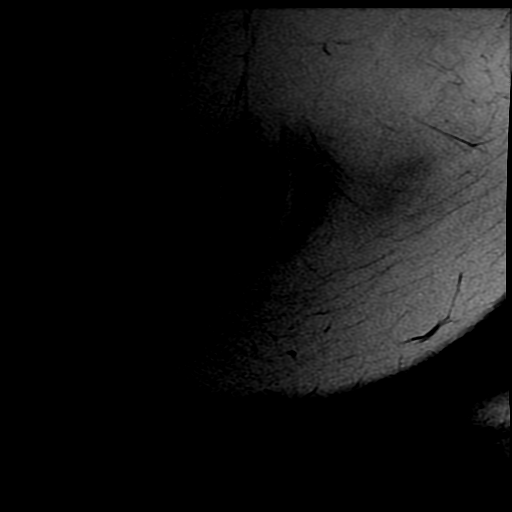

[Series 6: T2 · axial · 5.0mm · 0.47mm/px · z∈[-193,+23]mm · 2 of 37 slices shown (3 of 4)]
[im 1/37]
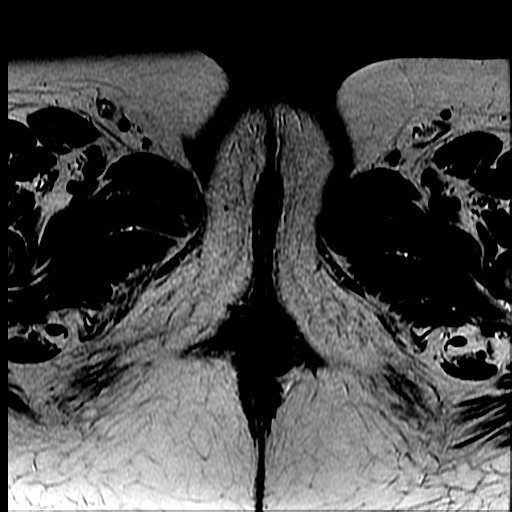
[im 37/37]
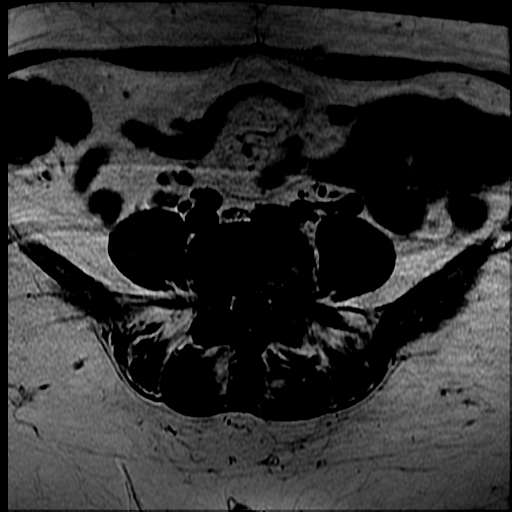

[Series 7: T2 fat-sat · axial · 5.0mm · 0.47mm/px · z∈[-193,+23]mm · 2 of 37 slices shown]
[im 1/37]
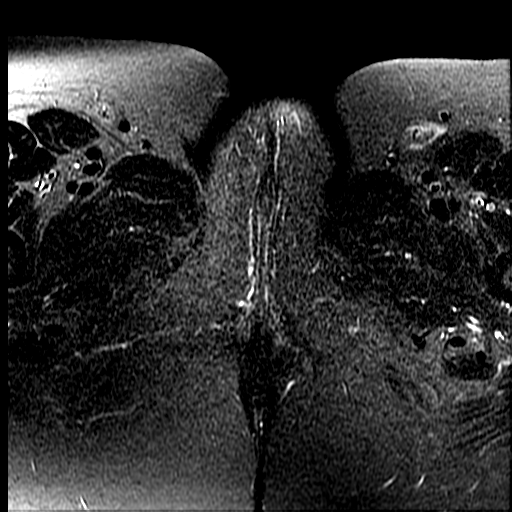
[im 37/37]
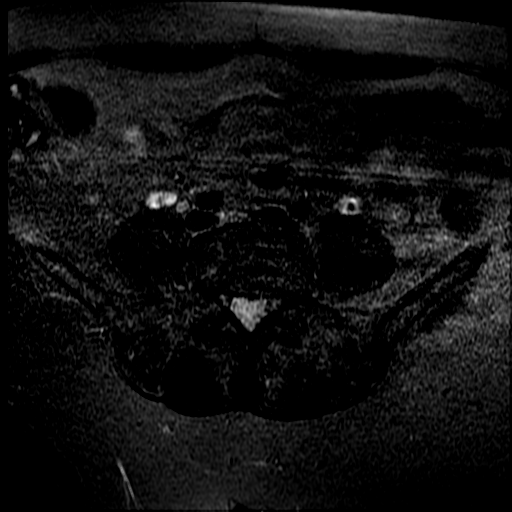

[Series 8: T2 · sagittal · 5.0mm · 0.47mm/px · 2 of 30 slices shown (4 of 4)]
[im 1/30]
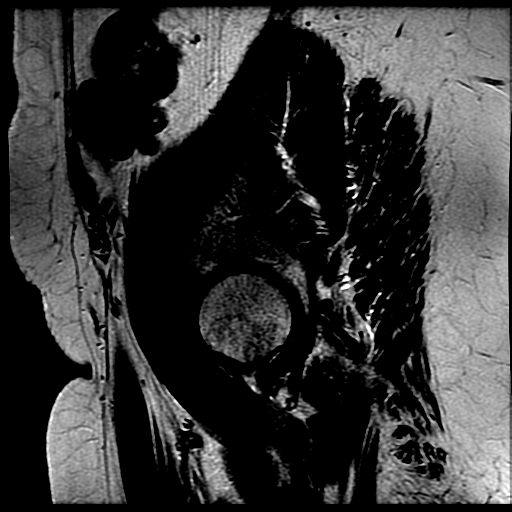
[im 30/30]
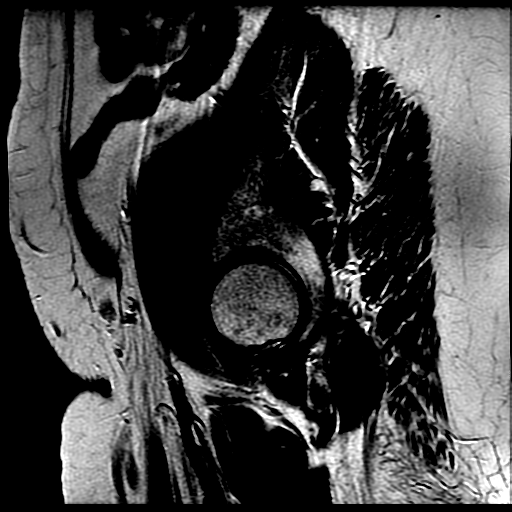

[Series 9: T1 · axial · 5.0mm · 0.47mm/px · z∈[-193,+23]mm · 2 of 37 slices shown]
[im 1/37]
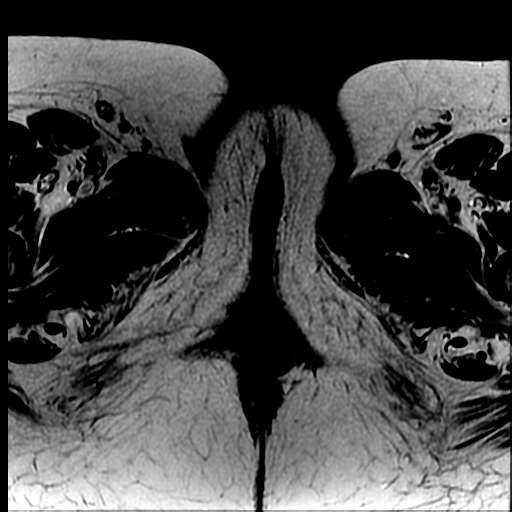
[im 37/37]
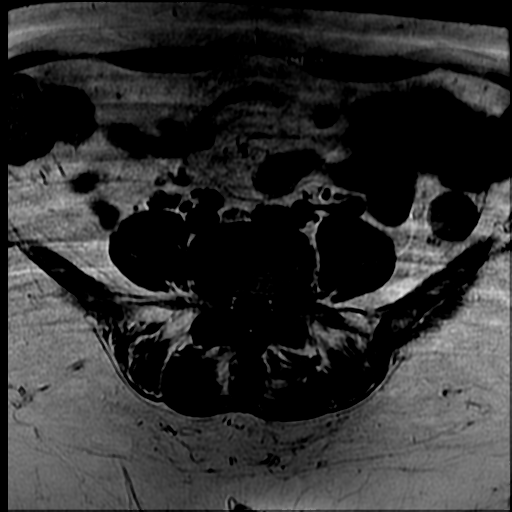

[Series 11: T2 post-contrast · sagittal · 5.0mm · 0.47mm/px · 2 of 30 slices shown]
[im 1/30]
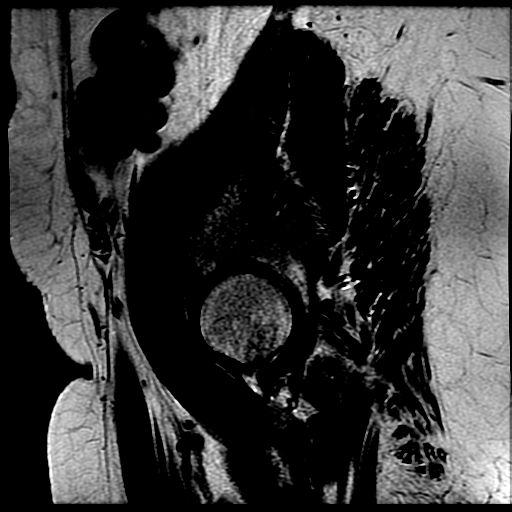
[im 30/30]
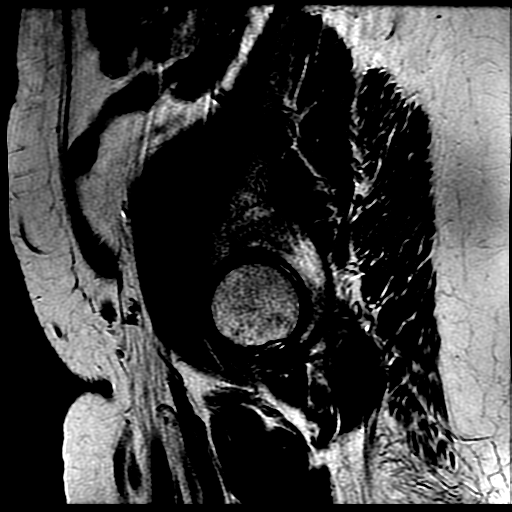

[Series 12: T1 fat-sat post-contrast · axial · 5.0mm · 0.47mm/px · z∈[-193,+23]mm · 2 of 37 slices shown (1 of 2)]
[im 1/37]
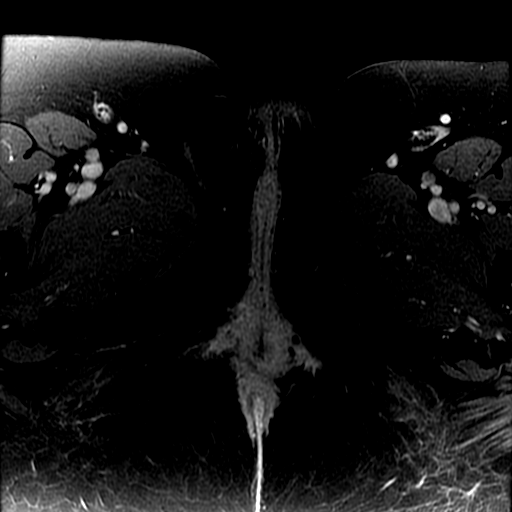
[im 37/37]
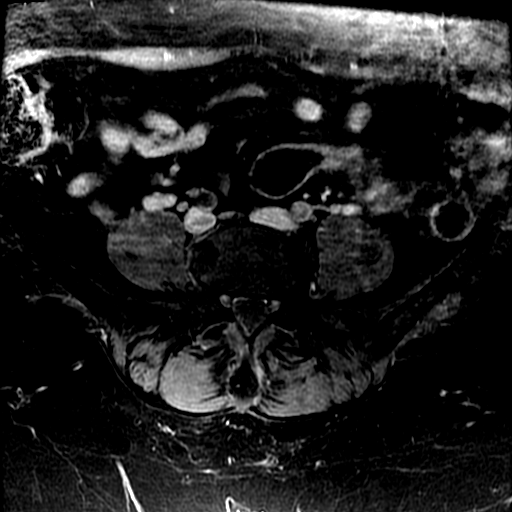

[Series 13: T1 fat-sat post-contrast · coronal · 5.0mm · 0.47mm/px · 1 of 34 slices shown (2 of 2)]
[im 1/34]
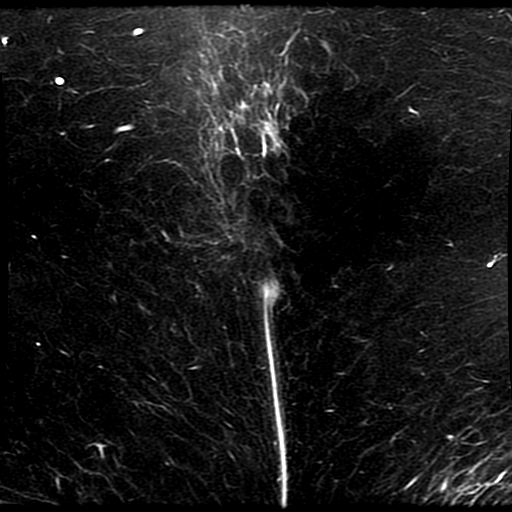

[19 of 48 positions shown; findings below may reference images not displayed]

FINDINGS: Urinary Tract: No urinary bladder or urethral abnormality.

Bowel: Unremarkable pelvic bowel loops.

Vascular/Lymphatic: Unremarkable. No pathologically enlarged pelvic
lymph nodes identified.

Reproductive:

-- Uterus: Measures 12.1 x 8.1 x 9.3 cm (volume = 480 cm^3).
Numerous fibroids, most of which are calcified, show no significant
change. Largest fibroid in the left fundal region measures 5.7 cm
and shows red degeneration, stable since prior CT. This fibroid
shows a submucosal component displacing the endometrium. No abnormal
endometrial thickening seen. Normal appearance of cervix and vagina.

-- Right ovary: Normal postmenopausal appearance. No adnexal mass
identified.

-- Left ovary: Normal postmenopausal appearance. No adnexal mass
identified.

Other: No peritoneal thickening or abnormal free fluid.

Musculoskeletal:  Unremarkable.
IMPRESSION: Multiple uterine fibroids, as described above, without significant
change compared to prior study.

No adnexal mass or other significant abnormality identified.

## 2018-12-26 ENCOUNTER — Encounter: Payer: Self-pay | Admitting: Internal Medicine

## 2018-12-28 MED FILL — predniSONE 20 MG TABS: 20 | 5 days supply | Qty: 5 | Fill #1

## 2019-01-08 ENCOUNTER — Telehealth: Payer: Self-pay | Admitting: Internal Medicine

## 2019-01-08 NOTE — Telephone Encounter (Signed)
Copied from [place] [id number]. Topic: General - Other >> Jan 08, 2019 12:08 PM Pauline Good wrote: Reason for CRM: pt wanting to know why her prednisone hasn't been called in per her MyChart message she spoke to Dr Ronnald Ramp. Pt is in tremendous pain and need some release. Please call pt. Pt use [employer]

## 2019-01-09 ENCOUNTER — Other Ambulatory Visit: Payer: Self-pay | Admitting: Internal Medicine

## 2019-01-09 DIAGNOSIS — M659 Synovitis and tenosynovitis, unspecified: Secondary | ICD-10-CM

## 2019-01-09 MED ORDER — METHYLPREDNISOLONE 4 MG PO TBPK: ORAL_TABLET | ORAL | 0 refills | Status: DC

## 2019-01-09 MED FILL — METHYLPREDNISOLONE 4 MG TBP: 4 | 6 days supply | Qty: 21 | Fill #0

## 2019-01-09 NOTE — Telephone Encounter (Signed)
Pt stated that a dose pack of prednisone was offered via my chart the end of April and that it was not sent in.  Can it be sent in?

## 2019-01-09 NOTE — Progress Notes (Signed)
Office Visit Note  Patient: Pamela Davenport             Date of Birth: [birth year]           MRN: [id number]             PCP: Janith Lima, MD Referring: Janith Lima, MD Visit Date: 01/15/2019 Occupation: RN at [employer]  Subjective:  Pain in multiple joints.   History of Present Illness: Pamela Davenport is a 63 y.o. female seen in consultation per request of Dr. Ronnald Ramp.  According to patient in January 2019 she started having pain and swelling in bilateral hands.  She was seen by Dr. Fredna Dow who injected her left fourth trigger finger which improved.  She states few months later she had couple of trigger fingers injected in her right hand.  She continued to have off-and-on swelling in her bilateral hands.  She states the swelling will moved on to different joints.  She has had pain and swelling in her knee joints, shoulder joints, feet and her hands.  She states she dealt with these episodes over the last year.  This year the symptoms got worse especially in her hands.  She states she was seen by Dr. Joseph Art at the urgent care who gave her a prednisone taper in February for 5 days and that resolved offers her symptoms.  She was seen by Dr. Ronnald Ramp in April 2020 when the lab work revealed positive rheumatoid factor positive CCP and positive ANA.  She also had elevated sedimentation rate.  We placed her on prednisone taper which she is finishing and is currently on the medication.  She states since she started taking prednisone taper her symptoms have resolved.  She denies any family history of autoimmune disease.  Activities of Daily Living:  Patient reports morning stiffness for 1-2 hours.   Patient Reports nocturnal pain.  Difficulty dressing/grooming: Reports Difficulty climbing stairs: Reports Difficulty getting out of chair: Denies Difficulty using hands for taps, buttons, cutlery, and/or writing: Reports  Review of Systems  Constitutional: Positive for fatigue. Negative for night  sweats, weight gain and weight loss.  HENT: Negative for mouth sores, trouble swallowing, trouble swallowing, mouth dryness and nose dryness.   Eyes: Positive for dryness. Negative for pain, redness and visual disturbance.  Respiratory: Negative for cough, shortness of breath and difficulty breathing.   Cardiovascular: Positive for palpitations. Negative for chest pain, hypertension, irregular heartbeat and swelling in legs/feet.  Gastrointestinal: Negative for blood in stool, constipation and diarrhea.  Endocrine: Negative for increased urination.  Genitourinary: Negative for vaginal dryness.  Musculoskeletal: Positive for arthralgias, joint pain, joint swelling and morning stiffness. Negative for myalgias, muscle weakness, muscle tenderness and myalgias.  Skin: Negative for color change, rash, hair loss, skin tightness, ulcers and sensitivity to sunlight.  Allergic/Immunologic: Negative for susceptible to infections.  Neurological: Negative for dizziness, memory loss, night sweats and weakness.  Hematological: Negative for swollen glands.  Psychiatric/Behavioral: Positive for sleep disturbance. Negative for depressed mood. The patient is not nervous/anxious.     PMFS History:  Patient Active Problem List   Diagnosis Date Noted  . Deficiency anemia 12/07/2018  . Iron deficiency anemia 12/07/2018  . Other dietary vitamin B12 deficiency anemia 12/07/2018  . Arthralgia 12/06/2018  . Synovitis of finger 12/06/2018  . Fibroid uterus 08/21/2018  . Fibroids 08/21/2018  . Visit for screening mammogram 04/18/2017  . Cervical cancer screening 04/18/2017  . Hyperlipidemia with target LDL less than 130 10/19/2016  .  Routine general medical examination at a health care facility 02/03/2015  . Essential hypertension 01/13/2015  . Chronic pulmonary embolism ([place]) 12/05/2008  . ATRIAL FIBRILLATION 12/05/2008  . PVC's (premature ventricular contractions) 12/05/2008  . DVT 12/05/2008    Past  Medical History:  Diagnosis Date  . Atypical chest pain    normal myoview 11/11/10  . Blood transfusion without reported diagnosis   . DVT (deep venous thrombosis) (HCC)    recurrent; chronically anticoagulated with coumadin  . Family history of anesthesia complication    Sister had decrease in respirations after surgery  . Heart murmur   . Herniated lumbar intervertebral disc   . Hypertension   . Paroxysmal atrial fibrillation (HCC)    SINCE AGE 89   . PE (pulmonary embolism)    chronically anticoagulated with coumadin  . Pneumonia 07/20/12  . PONV (postoperative nausea and vomiting)   . Sickle cell trait ([place])   . Sinus congestion 08/14/2018   C/O NASAL/CHEST  CONGESTION, COUGHING WITH PHLEGM, PER PATIENT , SHE IS TO BEGIN TAKING AUGMENTIN DOSE PACK TODAY;    . Strep throat at age 32   The patient reports a severe strep throat infection at age 40 and   is not clear as to whether or not she may have had rheumatic fever.  . Symptomatic premature ventricular contractions    improved s/p ablation  . Urgency of urination   . Uterine bleeding 2008   Uterine artery embolization in 2008 for uterine bleeding.    Family History  Problem Relation Age of Onset  . Hypertension Mother   . Diabetes Mother   . Uterine cancer Sister   . Prostate cancer Brother   . Prostate cancer Paternal Grandmother   . Colon cancer Paternal Grandfather   . Prostate cancer Brother   . Prostate cancer Brother   . Healthy Son    Past Surgical History:  Procedure Laterality Date  . COLONOSCOPY W/ POLYPECTOMY    . Diagnostic D&C hysteroscopy and Novasure ablation  03/17/2004  . DILATION AND CURETTAGE OF UTERUS  12/17/2011   Procedure: DILATATION AND CURETTAGE;  Surgeon: Frederico Hamman, MD;  Location: [facility];  Service: Gynecology;  Laterality: N/A;  With Attempted Hydrothermal Ablation  . LUMBAR LAMINECTOMY/DECOMPRESSION MICRODISCECTOMY Left 02/21/2013   Procedure: LUMBAR LAMINECTOMY/DECOMPRESSION  MICRODISCECTOMY 1 LEVEL;  Surgeon: Winfield Cunas, MD;  Location: [facility];  Service: Neurosurgery;  Laterality: Left;  LEFT Lumbar Three-Four Laminotomy foraminotomy microdiskectomy  . MANDIBLE SURGERY     under bite  . PVC Ablation  2010  . ROBOTIC ASSISTED TOTAL HYSTERECTOMY WITH BILATERAL SALPINGO OOPHERECTOMY Bilateral 08/21/2018   Procedure: XI ROBOTIC ASSISTED TOTAL HYSTERECTOMY WITH BILATERAL SALPINGO OOPHORECTOMY;  Surgeon: Everitt Amber, MD;  Location: [facility];  Service: Gynecology;  Laterality: Bilateral;  . SPINE SURGERY    . TONSILLECTOMY    . UTERINE ARTERY EMBOLIZATION  2008   for uterine bleeding  . 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 Tdap 02/03/2015     Objective: Vital Signs: BP 134/74 (BP Location: Right Arm, Patient Position: Sitting, Cuff Size: Large)   Pulse 81   Resp 16   Ht 5\' 9"  (1.753 m)   Wt 262 lb (118.8 kg)   BMI 38.69 kg/m    Physical Exam Vitals signs and nursing  note reviewed.  Constitutional:      Appearance: She is well-developed.  HENT:     Head: Normocephalic and atraumatic.  Eyes:     Conjunctiva/sclera: Conjunctivae normal.  Neck:     Musculoskeletal: Normal range of motion.  Cardiovascular:     Rate and Rhythm: Normal rate and regular rhythm.     Heart sounds: Normal heart sounds.  Pulmonary:     Effort: Pulmonary effort is normal.     Breath sounds: Normal breath sounds.  Abdominal:     General: Bowel sounds are normal.     Palpations: Abdomen is soft.  Lymphadenopathy:     Cervical: No cervical adenopathy.  Skin:    General: Skin is warm and dry.     Capillary Refill: Capillary refill takes less than 2 seconds.  Neurological:     Mental Status: She is alert and oriented to person, place, and time.  Psychiatric:        Behavior: Behavior normal.       Musculoskeletal Exam: C-spine thoracic and lumbar spine good range of motion.  Shoulder joints elbow joints wrist joint MCPs PIPs DIPs been good range of motion with no synovitis.  Hip joints knee joints ankles MTPs PIPs DIPs with good range of motion with no synovitis.   CDAI Exam: CDAI Score: 0  Patient Global Assessment: 0 (mm); Provider Global Assessment: 0 (mm) Swollen: 0 ; Tender: 0  Joint Exam   Not documented   There is currently no information documented on the homunculus. Go to the Rheumatology activity and complete the homunculus joint exam.  Investigation: Findings:  12/07/18: ANA 1:320 Cytoplasmic, CRP 2.3, Sed rate 45, HIV-, Hep C-, uric acid 5.4, RF 74, CCP >250  Component     Latest Ref Rng & Units 12/07/2018  Hepatitis C Ab     NON-REACTI NON-REACTIVE  SIGNAL TO CUT-OFF     <1.00 0.02  ANA Titer 1     titer 1:320 (H)  ANA Pattern 1      Cytoplasmic (A)  Anti Nuclear Antibody (ANA)     NEGATIVE POSITIVE (A)  CRP     0.5 - 20.0 mg/dL 2.3  Sed Rate     0 - 30 mm/hr 45 (H)  HIV     NON-REACTI NON-REACTIVE   Imaging: Xr Foot 2 Views Left  Result Date: 01/15/2019 First MTP, PIP and DIP narrowing was noted.  No intertarsal joint space narrowing was noted.  No erosive changes were noted.  Inferior and posterior calcaneal spurs were noted. Impression: These findings are consistent with osteoarthritis of the foot.  Xr Foot 2 Views Right  Result Date: 01/15/2019 First MTP, PIP and DIP narrowing was noted.  No intertarsal joint space narrowing was noted.  No erosive changes were noted.  Inferior and posterior calcaneal spurs were noted. Impression: These findings are consistent with osteoarthritis of the foot.  Xr Hand 2 View Left  Result Date: 01/15/2019 No MCP, intercarpal or radiocarpal joint space narrowing was noted.  Minimal PIP narrowing was noted.  No erosive changes were noted. Impression: These findings are consistent with mild osteoarthritis of the hand.   Xr Hand 2 View Right  Result Date: 01/15/2019 No MCP, intercarpal or radiocarpal joint space narrowing was noted.  Minimal PIP narrowing was noted.  No erosive changes were noted. Impression: These findings are consistent with mild osteoarthritis of the hand.  Xr Knee 3 View Left  Result Date: 01/15/2019 Mild medial compartment narrowing was noted.  Mild patellofemoral narrowing was noted.  No chondrocalcinosis was noted. Impression: These findings are consistent with mild osteoarthritis and mild chondromalacia patella.  Xr Knee 3 View Right  Result Date: 01/15/2019 Mild medial compartment narrowing was noted.  Mild patellofemoral narrowing was noted.  No chondrocalcinosis was noted. Impression: These findings are consistent with mild osteoarthritis and mild chondromalacia patella.  Xr Shoulder Left  Result Date: 01/15/2019 No glenohumeral joint space narrowing was noted.  No acromioclavicular joint space narrowing was noted.  No chondrocalcinosis was noted. Impression: Unremarkable x-ray of the shoulder joint.  Xr Shoulder Right  Result Date: 01/15/2019 No glenohumeral joint space narrowing was noted.  No acromioclavicular joint space narrowing was noted.  No chondrocalcinosis was noted. Impression: Unremarkable x-ray of the shoulder joint.   Recent Labs: Lab Results  Component Value Date   WBC 8.1 12/07/2018   HGB 11.5 (L) 12/07/2018   PLT 243.0 12/07/2018   NA 138 12/07/2018   K 3.7 12/07/2018   CL 106 12/07/2018   CO2 23 12/07/2018   GLUCOSE 97 12/07/2018   BUN 11 12/07/2018   CREATININE 0.79 12/07/2018   BILITOT 0.5 08/14/2018   ALKPHOS 80 08/14/2018   AST 20 08/14/2018   ALT 14 08/14/2018   PROT 8.1 08/14/2018   ALBUMIN 4.3 08/14/2018   CALCIUM 8.7 12/07/2018   GFRAA >60 08/22/2018    Speciality Comments: No specialty comments available.  Procedures:  No procedures performed Allergies: Methylprednisolone; Sulfa antibiotics; and Sulfonamide derivatives    Assessment / Plan:     Visit Diagnoses: Rheumatoid arthritis with rheumatoid factor of multiple sites without organ or system involvement-patient gives history of recurrent swelling in multiple joints over the last 1-1/2-year now.  She states she has been taking over-the-counter medications and had done really well after 2 rounds of steroid taper.  She currently does not have any swelling and she is currently taking Medrol dose pack.  12/07/18: ANA 1:320 Cytoplasmic, CRP 2.3, Sed rate 45, HIV-, Hep C-, uric acid 5.4, RF 74, CCP >250 -based on her history and positive serology we establish the diagnosis of rheumatoid arthritis.  Detailed counseling was provided.  Different treatment options were discussed.  Patient was in agreement to start on Plaquenil.  If she has an adequate response to Plaquenil then we may consider methotrexate in future.  Indication side effects contraindications of Plaquenil were discussed.  My plan is to start her on Plaquenil after lab work is available.  Her dose will be 200 mg p.o. twice daily.  We will check labs in a month and then every 3 months to monitor for drug toxicity.  Plan:CK  Patient was counseled on the purpose, proper use, and adverse effects of hydroxychloroquine including nausea/diarrhea, skin rash, headaches, and sun sensitivity.  Discussed importance of annual eye exams while on hydroxychloroquine to monitor to ocular toxicity and discussed importance of frequent laboratory monitoring.  Provided patient with eye exam form for baseline ophthalmologic exam.  Provided patient with educational materials on hydroxychloroquine and answered all questions.  Patient consented to hydroxychloroquine.  Will upload consent in the media tab.    Dose will be Plaquenil 200 mg p.o. twice daily.  Prescription pending lab results.   Chronic pain of both shoulders -she has chronic pain in her shoulders to the point she has difficulty lifting her arms.  Plan: XR Shoulder Left, XR  Shoulder Right.  The x-ray was unremarkable.  Pain in both hands -she has episodic pain and swelling in her hands and  has difficulty writing and using her hands.  Plan: XR Hand 2 View Right, XR Hand 2 View Left.  The x-ray of bilateral hands showed mild osteoarthritic changes.  Chronic pain of both knees -she gives history of episodes of bilateral knee joint swelling which is migratory to the point she has difficulty climbing stairs and walking.  Plan: XR KNEE 3 VIEW RIGHT, XR KNEE 3 VIEW LEFT.  The x-ray of bilateral knee joint showed mild osteoarthritis and mild chondromalacia patella.  Pain in both feet -she has episodic pain in her feet and swelling.  Plan: XR Foot 2 Views Right, XR Foot 2 Views Left x-ray of bilateral feet were consistent with osteoarthritis.  High risk medication use - Plan: COMPLETE METABOLIC PANEL WITH GFR, Hepatitis B core antibody, IgM, Hepatitis B surface antigen, QuantiFERON-TB Gold Plus, Serum protein electrophoresis with reflex, IgG, IgA, IgM, Glucose 6 phosphate dehydrogenase.  I had detailed discussion with the patient regarding different treatment options.  Positive ANA (antinuclear antibody) - Plan: Urinalysis, Routine w reflex microscopic, Anti-scleroderma antibody, RNP Antibody, Anti-Smith antibody, Sjogrens syndrome-A extractable nuclear antibody, Sjogrens syndrome-B extractable nuclear antibody, Anti-DNA antibody, double-stranded, C3 and C4, Beta-2 glycoprotein antibodies, Cardiolipin antibodies, IgG, IgM, IgA, Lupus Anticoagulant Eval w/Reflex  History of pulmonary embolism-patient gives history of pulmonary embolism several years ago.  She is a still on anticoagulant therapy.  She has positive ANA.  I will obtain additional labs today.  History of DVT (deep vein thrombosis)  History of atrial fibrillation  PVC's (premature ventricular contractions)  Essential hypertension-her blood pressure is controlled.  Hyperlipidemia with target LDL less than 130   History of anemia   Orders: Orders Placed This Encounter  Procedures  . XR Hand 2 View Right  . XR Hand 2 View Left  . XR KNEE 3 VIEW RIGHT  . XR KNEE 3 VIEW LEFT  . XR Foot 2 Views Right  . XR Foot 2 Views Left  . XR Shoulder Left  . XR Shoulder Right  . CK  . COMPLETE METABOLIC PANEL WITH GFR  . Urinalysis, Routine w reflex microscopic  . Anti-scleroderma antibody  . RNP Antibody  . Anti-Smith antibody  . Sjogrens syndrome-A extractable nuclear antibody  . Sjogrens syndrome-B extractable nuclear antibody  . Anti-DNA antibody, double-stranded  . C3 and C4  . Beta-2 glycoprotein antibodies  . Cardiolipin antibodies, IgG, IgM, IgA  . Lupus Anticoagulant Eval w/Reflex  . Hepatitis B core antibody, IgM  . Hepatitis B surface antigen  . QuantiFERON-TB Gold Plus  . Serum protein electrophoresis with reflex  . IgG, IgA, IgM  . Glucose 6 phosphate dehydrogenase   No orders of the defined types were placed in this encounter.   Face-to-face time spent with patient was 60 minutes. Greater than 50% of time was spent in counseling and coordination of care.  Follow-Up Instructions: Return for Rheumatoid arthritis.   Bo Merino, MD  Note - This record has been created using Editor, commissioning.  Chart creation errors have been sought, but may not always  have been located. Such creation errors do not reflect on  the standard of medical care.

## 2019-01-09 NOTE — Telephone Encounter (Signed)
Pt stated that prednisone went to [facility] and needs to go to [facility]. Asking if this can be resent today as soon as possible? Please advise  [place], [place] - [place] [phone] (Phone) [phone] (Fax)

## 2019-01-09 NOTE — Telephone Encounter (Signed)
This has been resent to [facility]

## 2019-01-15 ENCOUNTER — Ambulatory Visit (INDEPENDENT_AMBULATORY_CARE_PROVIDER_SITE_OTHER): Payer: 59

## 2019-01-15 ENCOUNTER — Ambulatory Visit: Payer: 59 | Admitting: Rheumatology

## 2019-01-15 ENCOUNTER — Ambulatory Visit: Payer: Self-pay

## 2019-01-15 ENCOUNTER — Encounter: Payer: Self-pay | Admitting: Rheumatology

## 2019-01-15 ENCOUNTER — Telehealth: Payer: Self-pay

## 2019-01-15 ENCOUNTER — Other Ambulatory Visit: Payer: Self-pay

## 2019-01-15 VITALS — BP 134/74 | HR 81 | Resp 16 | Ht 69.0 in | Wt 262.0 lb

## 2019-01-15 DIAGNOSIS — M25561 Pain in right knee: Secondary | ICD-10-CM

## 2019-01-15 DIAGNOSIS — R768 Other specified abnormal immunological findings in serum: Secondary | ICD-10-CM | POA: Diagnosis not present

## 2019-01-15 DIAGNOSIS — M25512 Pain in left shoulder: Secondary | ICD-10-CM | POA: Diagnosis not present

## 2019-01-15 DIAGNOSIS — M0579 Rheumatoid arthritis with rheumatoid factor of multiple sites without organ or systems involvement: Secondary | ICD-10-CM | POA: Diagnosis not present

## 2019-01-15 DIAGNOSIS — M79671 Pain in right foot: Secondary | ICD-10-CM | POA: Diagnosis not present

## 2019-01-15 DIAGNOSIS — M25511 Pain in right shoulder: Secondary | ICD-10-CM

## 2019-01-15 DIAGNOSIS — I10 Essential (primary) hypertension: Secondary | ICD-10-CM

## 2019-01-15 DIAGNOSIS — M25562 Pain in left knee: Secondary | ICD-10-CM

## 2019-01-15 DIAGNOSIS — Z79899 Other long term (current) drug therapy: Secondary | ICD-10-CM

## 2019-01-15 DIAGNOSIS — G8929 Other chronic pain: Secondary | ICD-10-CM

## 2019-01-15 DIAGNOSIS — Z86718 Personal history of other venous thrombosis and embolism: Secondary | ICD-10-CM | POA: Diagnosis not present

## 2019-01-15 DIAGNOSIS — M79672 Pain in left foot: Secondary | ICD-10-CM | POA: Diagnosis not present

## 2019-01-15 DIAGNOSIS — M79641 Pain in right hand: Secondary | ICD-10-CM

## 2019-01-15 DIAGNOSIS — Z86711 Personal history of pulmonary embolism: Secondary | ICD-10-CM

## 2019-01-15 DIAGNOSIS — M79642 Pain in left hand: Secondary | ICD-10-CM | POA: Diagnosis not present

## 2019-01-15 DIAGNOSIS — Z862 Personal history of diseases of the blood and blood-forming organs and certain disorders involving the immune mechanism: Secondary | ICD-10-CM

## 2019-01-15 DIAGNOSIS — Z8679 Personal history of other diseases of the circulatory system: Secondary | ICD-10-CM

## 2019-01-15 DIAGNOSIS — M3390 Dermatopolymyositis, unspecified, organ involvement unspecified: Secondary | ICD-10-CM

## 2019-01-15 DIAGNOSIS — M2550 Pain in unspecified joint: Secondary | ICD-10-CM | POA: Diagnosis not present

## 2019-01-15 DIAGNOSIS — E785 Hyperlipidemia, unspecified: Secondary | ICD-10-CM

## 2019-01-15 DIAGNOSIS — I493 Ventricular premature depolarization: Secondary | ICD-10-CM

## 2019-01-15 NOTE — Patient Instructions (Addendum)
Hydroxychloroquine tablets What is this medicine? HYDROXYCHLOROQUINE (hye drox ee KLOR oh kwin) is used to treat rheumatoid arthritis and systemic lupus erythematosus. It is also used to treat malaria. This medicine may be used for other purposes; ask your health care provider or pharmacist if you have questions. COMMON BRAND NAME(S): Plaquenil, Quineprox What should I tell my health care provider before I take this medicine? They need to know if you have any of these conditions: -diabetes -eye disease, vision problems -G6PD deficiency -history of blood diseases -history of irregular heartbeat -if you often drink alcohol -kidney disease -liver disease -porphyria -psoriasis -seizures -an unusual or allergic reaction to chloroquine, hydroxychloroquine, other medicines, foods, dyes, or preservatives -pregnant or trying to get pregnant -breast-feeding How should I use this medicine? Take this medicine by mouth with a glass of water. Follow the directions on the prescription label. Avoid taking antacids within 4 hours of taking this medicine. It is best to separate these medicines by at least 4 hours. Do not cut, crush or chew this medicine. You can take it with or without food. If it upsets your stomach, take it with food. Take your medicine at regular intervals. Do not take your medicine more often than directed. Take all of your medicine as directed even if you think you are better. Do not skip doses or stop your medicine early. Talk to your pediatrician regarding the use of this medicine in children. While this drug may be prescribed for selected conditions, precautions do apply. Overdosage: If you think you have taken too much of this medicine contact a poison control center or emergency room at once. NOTE: This medicine is only for you. Do not share this medicine with others. What if I miss a dose? If you miss a dose, take it as soon as you can. If it is almost time for your next dose,  take only that dose. Do not take double or extra doses. What may interact with this medicine? Do not take this medicine with any of the following medications: -cisapride -dofetilide -dronedarone -live virus vaccines -penicillamine -pimozide -thioridazine -ziprasidone This medicine may also interact with the following medications: -ampicillin -antacids -cimetidine -cyclosporine -digoxin -medicines for diabetes, like insulin, glipizide, glyburide -medicines for seizures like carbamazepine, phenobarbital, phenytoin -mefloquine -methotrexate -other medicines that prolong the QT interval (cause an abnormal heart rhythm) -praziquantel This list may not describe all possible interactions. Give your health care provider a list of all the medicines, herbs, non-prescription drugs, or dietary supplements you use. Also tell them if you smoke, drink alcohol, or use illegal drugs. Some items may interact with your medicine. What should I watch for while using this medicine? Tell your doctor or healthcare professional if your symptoms do not start to get better or if they get worse. Avoid taking antacids within 4 hours of taking this medicine. It is best to separate these medicines by at least 4 hours. Tell your doctor or health care professional right away if you have any change in your eyesight. Your vision and blood may be tested before and during use of this medicine. This medicine can make you more sensitive to the sun. Keep out of the sun. If you cannot avoid being in the sun, wear protective clothing and use sunscreen. Do not use sun lamps or tanning beds/booths. What side effects may I notice from receiving this medicine? Side effects that you should report to your doctor or health care professional as soon as possible: -allergic reactions like skin rash,   itching or hives, swelling of the face, lips, or tongue -changes in vision -decreased hearing or ringing of the ears -redness,  blistering, peeling or loosening of the skin, including inside the mouth -seizures -sensitivity to light -signs and symptoms of a dangerous change in heartbeat or heart rhythm like chest pain; dizziness; fast or irregular heartbeat; palpitations; feeling faint or lightheaded, falls; breathing problems -signs and symptoms of liver injury like dark yellow or brown urine; general ill feeling or flu-like symptoms; light-colored stools; loss of appetite; nausea; right upper belly pain; unusually weak or tired; yellowing of the eyes or skin -signs and symptoms of low blood sugar such as feeling anxious; confusion; dizziness; increased hunger; unusually weak or tired; sweating; shakiness; cold; irritable; headache; blurred vision; fast heartbeat; loss of consciousness -uncontrollable head, mouth, neck, arm, or leg movements Side effects that usually do not require medical attention (report to your doctor or health care professional if they continue or are bothersome): -anxious -diarrhea -dizziness -hair loss -headache -irritable -loss of appetite -nausea, vomiting -stomach pain This list may not describe all possible side effects. Call your doctor for medical advice about side effects. You may report side effects to FDA at 1-800-FDA-1088. Where should I keep my medicine? Keep out of the reach of children. In children, this medicine can cause overdose with small doses. Store at room temperature between 15 and 30 degrees C (59 and 86 degrees F). Protect from moisture and light. Throw away any unused medicine after the expiration date. NOTE: This sheet is a summary. It may not cover all possible information. If you have questions about this medicine, talk to your doctor, pharmacist, or health care provider.  2019 Elsevier/Gold Standard (2016-03-31 14:16:15)  Standing Labs We placed an order today for your standing lab work.    Please come back and get your standing labs in 1 month after starting  Plaquenil and then every 3 months  We have open lab Monday through Friday from 8:30-11:30 AM and 1:30-4:00 PM  at the office of Dr. Bo Merino.   You may experience shorter wait times on Monday and Friday afternoons. The office is located at [address] [place], [place], [place] [address] No appointment is necessary.   Labs are drawn by [facility].  You may receive a bill from [facility] for your lab work.  If you wish to have your labs drawn at another location, please call the office 24 hours in advance to send orders.  If you have any questions regarding directions or hours of operation,  please call [phone].   Just as a reminder please drink plenty of water prior to coming for your lab work. Thanks!

## 2019-01-15 NOTE — Telephone Encounter (Signed)
Patient will be starting on PLQ pending lab results, per Dr. Estanislado Pandy. Refer to note from 01/15/2019 for dose.

## 2019-01-17 MED FILL — LOSARTAN POTASSIUM 25 MG TA: 25 | 30 days supply | Qty: 30 | Fill #7

## 2019-01-18 LAB — COMPLETE METABOLIC PANEL WITH GFR
AG Ratio: 1.3 (calc) (ref 1.0–2.5)
ALT: 9 U/L (ref 6–29)
AST: 13 U/L (ref 10–35)
Albumin: 4.4 g/dL (ref 3.6–5.1)
Alkaline phosphatase (APISO): 88 U/L (ref 37–153)
BUN: 24 mg/dL (ref 7–25)
CO2: 28 mmol/L (ref 20–32)
Calcium: 9.5 mg/dL (ref 8.6–10.4)
Chloride: 103 mmol/L (ref 98–110)
Creat: 0.9 mg/dL (ref 0.50–0.99)
GFR, Est African American: 79 mL/min/{1.73_m2} (ref >=60)
GFR, Est Non African American: 69 mL/min/{1.73_m2} (ref >=60)
Globulin: 3.3 g/dL (calc) (ref 1.9–3.7)
Glucose, Bld: 86 mg/dL (ref 65–99)
Potassium: 4.2 mmol/L (ref 3.5–5.3)
Sodium: 139 mmol/L (ref 135–146)
Total Bilirubin: 0.3 mg/dL (ref 0.2–1.2)
Total Protein: 7.7 g/dL (ref 6.1–8.1)

## 2019-01-18 LAB — URINALYSIS, ROUTINE W REFLEX MICROSCOPIC
Bilirubin Urine: NEGATIVE
Glucose, UA: NEGATIVE
Hgb urine dipstick: NEGATIVE
Ketones, ur: NEGATIVE
Leukocytes,Ua: NEGATIVE
Nitrite: NEGATIVE
Protein, ur: NEGATIVE
Specific Gravity, Urine: 1.02 (ref 1.001–1.03)
pH: <=5 (ref 5.0–8.0)

## 2019-01-18 LAB — PROTEIN ELECTROPHORESIS, SERUM, WITH REFLEX
Albumin ELP: 4 g/dL (ref 3.8–4.8)
Alpha 1: 0.3 g/dL (ref 0.2–0.3)
Alpha 2: 0.8 g/dL (ref 0.5–0.9)
Beta 2: 0.6 g/dL — ABNORMAL HIGH (ref 0.2–0.5)
Beta Globulin: 0.5 g/dL (ref 0.4–0.6)
Gamma Globulin: 1.3 g/dL (ref 0.8–1.7)
Total Protein: 7.6 g/dL (ref 6.1–8.1)

## 2019-01-18 LAB — IGG, IGA, IGM
IgG (Immunoglobin G), Serum: 1493 mg/dL (ref 600–1540)
IgM, Serum: 76 mg/dL (ref 50–300)
Immunoglobulin A: 419 mg/dL — ABNORMAL HIGH (ref 70–320)

## 2019-01-18 LAB — QUANTIFERON-TB GOLD PLUS
Mitogen-NIL: >10 IU/mL
NIL: 0.02 IU/mL
QuantiFERON-TB Gold Plus: NEGATIVE
TB1-NIL: 0.16 IU/mL
TB2-NIL: 0.07 IU/mL

## 2019-01-18 LAB — BETA-2 GLYCOPROTEIN ANTIBODIES
Beta-2 Glyco 1 IgA: <9 SAU (ref <=20)
Beta-2 Glyco 1 IgM: <9 SMU (ref <=20)
Beta-2 Glyco I IgG: <9 SGU (ref <=20)

## 2019-01-18 LAB — LUPUS ANTICOAGULANT EVAL W/ REFLEX
PTT-LA Screen: 39 s (ref <=40)
dRVVT: 63 s — ABNORMAL HIGH (ref <=45)

## 2019-01-18 LAB — ANTI-SCLERODERMA ANTIBODY: Scleroderma (Scl-70) (ENA) Antibody, IgG: <1 AI

## 2019-01-18 LAB — CK: Total CK: 64 U/L (ref 29–143)

## 2019-01-18 LAB — HEPATITIS B SURFACE ANTIGEN: Hepatitis B Surface Ag: NONREACTIVE

## 2019-01-18 LAB — ANTI-SMITH ANTIBODY: ENA SM Ab Ser-aCnc: <1 AI

## 2019-01-18 LAB — HEPATITIS B CORE ANTIBODY, IGM: Hep B C IgM: NONREACTIVE

## 2019-01-18 LAB — IFE INTERPRETATION: Immunofix Electr Int: NOT DETECTED

## 2019-01-18 LAB — C3 AND C4
C3 Complement: 189 mg/dL (ref 83–193)
C4 Complement: 35 mg/dL (ref 15–57)

## 2019-01-18 LAB — GLUCOSE 6 PHOSPHATE DEHYDROGENASE: G-6PDH: 16.6 U/g Hgb (ref 7.0–20.5)

## 2019-01-18 LAB — RNP ANTIBODY: Ribonucleic Protein(ENA) Antibody, IgG: <1 AI

## 2019-01-18 LAB — ANTI-DNA ANTIBODY, DOUBLE-STRANDED: ds DNA Ab: <1 IU/mL

## 2019-01-18 LAB — CARDIOLIPIN ANTIBODIES, IGG, IGM, IGA
Anticardiolipin IgA: <11 [APL'U]
Anticardiolipin IgG: <14 [GPL'U]
Anticardiolipin IgM: <12 [MPL'U]

## 2019-01-18 LAB — SJOGRENS SYNDROME-A EXTRACTABLE NUCLEAR ANTIBODY: SSA (Ro) (ENA) Antibody, IgG: <1 AI

## 2019-01-18 LAB — SJOGRENS SYNDROME-B EXTRACTABLE NUCLEAR ANTIBODY: SSB (La) (ENA) Antibody, IgG: <1 AI

## 2019-01-18 LAB — RFLX DRVVT CONFRIM: DRVVT CONFIRM: NEGATIVE

## 2019-01-18 MED FILL — METHYLPREDNISOLONE 4 MG TBP: 4 | 6 days supply | Qty: 21 | Fill #0

## 2019-01-18 NOTE — Telephone Encounter (Signed)
Attempted to contact the patient and left message for patient to call the office.  

## 2019-01-18 NOTE — Progress Notes (Signed)
Will discuss at the fu visit

## 2019-01-23 MED ORDER — HYDROXYCHLOROQUINE SULFATE 200 MG PO TABS: 200.0000 mg | ORAL_TABLET | Freq: Two times a day (BID) | ORAL | 0 refills | Status: DC

## 2019-01-23 MED FILL — HYDROXYCHLOROQUINE 200 MG: 200 | 30 days supply | Qty: 60 | Fill #0

## 2019-01-23 NOTE — Addendum Note (Signed)
Addended by: Carole Binning on: 01/23/2019 08:51 AM   Modules accepted: Orders

## 2019-01-23 NOTE — Telephone Encounter (Signed)
Spoke with patient and verified pharmacy. Sent prescription to the pharmacy and advised patient.

## 2019-01-25 DIAGNOSIS — H10413 Chronic giant papillary conjunctivitis, bilateral: Secondary | ICD-10-CM | POA: Diagnosis not present

## 2019-01-25 DIAGNOSIS — H2513 Age-related nuclear cataract, bilateral: Secondary | ICD-10-CM | POA: Diagnosis not present

## 2019-01-25 DIAGNOSIS — H35412 Lattice degeneration of retina, left eye: Secondary | ICD-10-CM | POA: Diagnosis not present

## 2019-01-25 DIAGNOSIS — Z79899 Other long term (current) drug therapy: Secondary | ICD-10-CM | POA: Diagnosis not present

## 2019-01-25 DIAGNOSIS — H04123 Dry eye syndrome of bilateral lacrimal glands: Secondary | ICD-10-CM | POA: Diagnosis not present

## 2019-01-31 NOTE — Progress Notes (Signed)
Office Visit Note  Patient: Pamela Davenport             Date of Birth: May 09, 1956           MRN: [id number]             PCP: Janith Lima, MD Referring: Janith Lima, MD Visit Date: 02/13/2019 Occupation: @GUAROCC @  Subjective:  Pain in both hands.    History of Present Illness: Pamela Davenport is a 63 y.o. female with history of seropositive rheumatoid arthritis and osteoarthritis.  She was a started on Plaquenil at the last visit after evaluation.  She has noticed remarkable improvement on Plaquenil.  She denies any joint swelling although she still have some pain and stiffness with some minor activities.  She has off-and-on discomfort in her right knee joint.  She relates it to weather change.  Activities of Daily Living:  Patient reports morning stiffness for 30 minutes.   Patient Reports nocturnal pain.  Difficulty dressing/grooming: Denies Difficulty climbing stairs: Denies Difficulty getting out of chair: Denies Difficulty using hands for taps, buttons, cutlery, and/or writing: Denies  Review of Systems  Constitutional: Positive for fatigue. Negative for night sweats, weight gain and weight loss.  HENT: Negative for mouth sores, trouble swallowing, trouble swallowing, mouth dryness and nose dryness.   Eyes: Negative for pain, redness, visual disturbance and dryness.  Respiratory: Negative for cough, shortness of breath and difficulty breathing.   Cardiovascular: Negative for chest pain, palpitations, hypertension, irregular heartbeat and swelling in legs/feet.  Gastrointestinal: Negative for blood in stool, constipation and diarrhea.  Endocrine: Positive for increased urination.  Genitourinary: Negative for difficulty urinating and vaginal dryness.  Musculoskeletal: Positive for arthralgias, joint pain and morning stiffness. Negative for joint swelling, myalgias, muscle weakness, muscle tenderness and myalgias.  Skin: Negative for color change, rash, hair loss, skin  tightness, ulcers and sensitivity to sunlight.  Allergic/Immunologic: Negative for susceptible to infections.  Neurological: Negative for dizziness, numbness, memory loss, night sweats and weakness.  Hematological: Negative for bruising/bleeding tendency and swollen glands.  Psychiatric/Behavioral: Positive for sleep disturbance. Negative for depressed mood. The patient is not nervous/anxious.     PMFS History:  Patient Active Problem List   Diagnosis Date Noted  . Deficiency anemia 12/07/2018  . Iron deficiency anemia 12/07/2018  . Other dietary vitamin B12 deficiency anemia 12/07/2018  . Arthralgia 12/06/2018  . Synovitis of finger 12/06/2018  . Fibroid uterus 08/21/2018  . Fibroids 08/21/2018  . Visit for screening mammogram 04/18/2017  . Cervical cancer screening 04/18/2017  . Hyperlipidemia with target LDL less than 130 10/19/2016  . Routine general medical examination at a health care facility 02/03/2015  . Essential hypertension 01/13/2015  . Chronic pulmonary embolism ([place]) 12/05/2008  . ATRIAL FIBRILLATION 12/05/2008  . PVC's (premature ventricular contractions) 12/05/2008  . DVT 12/05/2008    Past Medical History:  Diagnosis Date  . Atypical chest pain    normal myoview 11/11/10  . Blood transfusion without reported diagnosis   . DVT (deep venous thrombosis) (HCC)    recurrent; chronically anticoagulated with coumadin  . Family history of anesthesia complication    Sister had decrease in respirations after surgery  . Heart murmur   . Herniated lumbar intervertebral disc   . Hypertension   . Paroxysmal atrial fibrillation (HCC)    SINCE AGE 72   . PE (pulmonary embolism)    chronically anticoagulated with coumadin  . Pneumonia 07/20/12  . PONV (postoperative nausea and vomiting)   .  Sickle cell trait ([place])   . Sinus congestion 08/14/2018   C/O NASAL/CHEST  CONGESTION, COUGHING WITH PHLEGM, PER PATIENT , SHE IS TO BEGIN TAKING AUGMENTIN DOSE PACK TODAY;    .  Strep throat at age 60   The patient reports a severe strep throat infection at age 12 and   is not clear as to whether or not she may have had rheumatic fever.  . Symptomatic premature ventricular contractions    improved s/p ablation  . Urgency of urination   . Uterine bleeding 2008   Uterine artery embolization in 2008 for uterine bleeding.    Family History  Problem Relation Age of Onset  . Hypertension Mother   . Diabetes Mother   . Uterine cancer Sister   . Prostate cancer Brother   . Prostate cancer Paternal Grandmother   . Colon cancer Paternal Grandfather   . Prostate cancer Brother   . Prostate cancer Brother   . Healthy Son    Past Surgical History:  Procedure Laterality Date  . COLONOSCOPY W/ POLYPECTOMY    . Diagnostic D&C hysteroscopy and Novasure ablation  03/17/2004  . DILATION AND CURETTAGE OF UTERUS  12/17/2011   Procedure: DILATATION AND CURETTAGE;  Surgeon: Frederico Hamman, MD;  Location: [facility];  Service: Gynecology;  Laterality: N/A;  With Attempted Hydrothermal Ablation  . LUMBAR LAMINECTOMY/DECOMPRESSION MICRODISCECTOMY Left 02/21/2013   Procedure: LUMBAR LAMINECTOMY/DECOMPRESSION MICRODISCECTOMY 1 LEVEL;  Surgeon: Winfield Cunas, MD;  Location: [facility];  Service: Neurosurgery;  Laterality: Left;  LEFT Lumbar Three-Four Laminotomy foraminotomy microdiskectomy  . MANDIBLE SURGERY     under bite  . PVC Ablation  2010  . ROBOTIC ASSISTED TOTAL HYSTERECTOMY WITH BILATERAL SALPINGO OOPHERECTOMY Bilateral 08/21/2018   Procedure: XI ROBOTIC ASSISTED TOTAL HYSTERECTOMY WITH BILATERAL SALPINGO OOPHORECTOMY;  Surgeon: Everitt Amber, MD;  Location: [facility];  Service: Gynecology;  Laterality: Bilateral;  . SPINE SURGERY    . TONSILLECTOMY    . UTERINE ARTERY EMBOLIZATION  2008   for uterine bleeding  . VASCULAR SURGERY     laser of left leg   Social History   Social History Narrative   Works as a Marine scientist at [employer]  History  Administered Date(s) Administered  . Tdap 02/03/2015     Objective: Vital Signs: BP (!) 155/85 (BP Location: Left Arm, Patient Position: Sitting, Cuff Size: Normal)   Pulse 74   Resp 14   Ht 5\' 9"  (1.753 m)   Wt 262 lb 6.4 oz (119 kg)   BMI 38.75 kg/m    Physical Exam Vitals signs and nursing note reviewed.  Constitutional:      Appearance: She is well-developed.  HENT:     Head: Normocephalic and atraumatic.  Eyes:     Conjunctiva/sclera: Conjunctivae normal.  Neck:     Musculoskeletal: Normal range of motion.  Cardiovascular:     Rate and Rhythm: Normal rate and regular rhythm.     Heart sounds: Normal heart sounds.  Pulmonary:     Effort: Pulmonary effort is normal.     Breath sounds: Normal breath sounds.  Abdominal:     General: Bowel sounds are normal.     Palpations: Abdomen is soft.  Lymphadenopathy:     Cervical: No cervical adenopathy.  Skin:    General: Skin is warm and dry.     Capillary Refill: Capillary refill takes less than 2 seconds.  Neurological:     Mental Status: She is alert  and oriented to person, place, and time.  Psychiatric:        Behavior: Behavior normal.      Musculoskeletal Exam: C-spine thoracic and lumbar spine good range of motion.  Shoulder joints elbow joints wrist joints with good range of motion.  She had no synovitis over MCPs PIPs or DIP joints.  Hip joints knee joints ankles MTPs PIPs with good range of motion.  She has some discomfort range of motion of her right knee joint.  No warmth swelling or effusion was noted.  CDAI Exam: CDAI Score: 0.6  Patient Global: 3 mm; Provider Global: 3 mm Swollen: 0 ; Tender: 0  Joint Exam   No joint exam has been documented for this visit   There is currently no information documented on the homunculus. Go to the Rheumatology activity and complete the homunculus joint exam.  Investigation: No additional findings.  Imaging: Xr Foot 2 Views Left  Result Date: 01/15/2019  First MTP, PIP and DIP narrowing was noted.  No intertarsal joint space narrowing was noted.  No erosive changes were noted.  Inferior and posterior calcaneal spurs were noted. Impression: These findings are consistent with osteoarthritis of the foot.  Xr Foot 2 Views Right  Result Date: 01/15/2019 First MTP, PIP and DIP narrowing was noted.  No intertarsal joint space narrowing was noted.  No erosive changes were noted.  Inferior and posterior calcaneal spurs were noted. Impression: These findings are consistent with osteoarthritis of the foot.  Xr Hand 2 View Left  Result Date: 01/15/2019 No MCP, intercarpal or radiocarpal joint space narrowing was noted.  Minimal PIP narrowing was noted.  No erosive changes were noted. Impression: These findings are consistent with mild osteoarthritis of the hand.  Xr Hand 2 View Right  Result Date: 01/15/2019 No MCP, intercarpal or radiocarpal joint space narrowing was noted.  Minimal PIP narrowing was noted.  No erosive changes were noted. Impression: These findings are consistent with mild osteoarthritis of the hand.  Xr Knee 3 View Left  Result Date: 01/15/2019 Mild medial compartment narrowing was noted.  Mild patellofemoral narrowing was noted.  No chondrocalcinosis was noted. Impression: These findings are consistent with mild osteoarthritis and mild chondromalacia patella.  Xr Knee 3 View Right  Result Date: 01/15/2019 Mild medial compartment narrowing was noted.  Mild patellofemoral narrowing was noted.  No chondrocalcinosis was noted. Impression: These findings are consistent with mild osteoarthritis and mild chondromalacia patella.  Xr Shoulder Left  Result Date: 01/15/2019 No glenohumeral joint space narrowing was noted.  No acromioclavicular joint space narrowing was noted.  No chondrocalcinosis was noted. Impression: Unremarkable x-ray of the shoulder joint.  Xr Shoulder Right  Result Date: 01/15/2019 No glenohumeral joint space  narrowing was noted.  No acromioclavicular joint space narrowing was noted.  No chondrocalcinosis was noted. Impression: Unremarkable x-ray of the shoulder joint.   Recent Labs: Lab Results  Component Value Date   WBC 8.1 12/07/2018   HGB 11.5 (L) 12/07/2018   PLT 243.0 12/07/2018   NA 139 01/15/2019   K 4.2 01/15/2019   CL 103 01/15/2019   CO2 28 01/15/2019   GLUCOSE 86 01/15/2019   BUN 24 01/15/2019   CREATININE 0.90 01/15/2019   BILITOT 0.3 01/15/2019   ALKPHOS 80 08/14/2018   AST 13 01/15/2019   ALT 9 01/15/2019   PROT 7.7 01/15/2019   PROT 7.6 01/15/2019   ALBUMIN 4.3 08/14/2018   CALCIUM 9.5 01/15/2019   GFRAA 79 01/15/2019   QFTBGOLDPLUS NEGATIVE  01/15/2019  UA -WBC, few Bacteria, CK 64, hepatitis B-, IgA mildly elevated, IFE negative, G6PD normal, TB Gold negative, lupus anticoagulant negative, beta-2 negative, anticardiolipin negative, C3-C4 normal, ENA negative  12/07/18: ANA 1:320 Cytoplasmic, CRP 2.3, Sed rate 45, HIV-, Hep C-, uric acid 5.4, RF 74, CCP >250  Speciality Comments: PLQ Eye Exam 01/25/19 WNL @ [facility]  Follow up in 6 months   Procedures:  No procedures performed Allergies: Methylprednisolone, Sulfa antibiotics, and Sulfonamide derivatives   Assessment / Plan:     Visit Diagnoses: Rheumatoid arthritis with rheumatoid factor of multiple sites without organ or systems involvement (HCC) - RF 74, CCP >250 ,Sed rate 45, ANA 1: 320 cytoplasmic, positive synovitis -canal.  I did not see any synovitis today.  She states she has some stiffness in her joints.  I will check her sed rate and CRP with her next labs.  Plan: Sedimentation rate, C-reactive protein,   High risk medication use - Patient was a started on Plaquenil 200 mg twice daily May 26, 2020Plaquenil 200 mg twice daily.  Last Plaquenil eye exam normal on 01/25/2019.  Most recent CMP within normal limits on 01/15/2019.  Last CBC within normal limits except for borderline low  hemoglobin/hematocrit on 12/07/2018.  Will monitor every 3 months.  If is stable then we will change to every 5 months.  Primary osteoarthritis of both hands - Plan: Joint protection and muscle strengthening was discussed.  Primary osteoarthritis of both knees - mild osteoarthritis and mild chondromalacia patella - Plan: Weight loss diet and exercise was discussed.  Primary osteoarthritis of both feet - Plan: Use of proper fitting shoes was discussed.  Positive ANA (antinuclear antibody) - ENA, C3-C4, anticardiolipin, lupus anticoagulant, beta-2 GP 1-were all negative.  History of DVT (deep vein thrombosis)   History of pulmonary embolism   History of atrial fibrillation  PVC's (premature ventricular contractions)   Hyperlipidemia with target LDL less than 130   Essential hypertension   History of anemia   Orders: Orders Placed This Encounter  Procedures  . Sedimentation rate  . C-reactive protein   No orders of the defined types were placed in this encounter.     Follow-Up Instructions: Return in about 5 months (around 07/16/2019) for Rheumatoid arthritis, Osteoarthritis.   Bo Merino, MD  Note - This record has been created using Editor, commissioning.  Chart creation errors have been sought, but may not always  have been located. Such creation errors do not reflect on  the standard of medical care.

## 2019-02-01 ENCOUNTER — Telehealth: Payer: Self-pay | Admitting: *Deleted

## 2019-02-01 NOTE — Telephone Encounter (Signed)
Patient called and stated "they are staying that a page was missing from my disability paperwork and can't pay me. Can Melissa look it to this for me." Explained that I would call her back after talking with Melissa. Found patient's paperwork and refax to the company. Called and explained that to the company. Explained for her to call us back if that was not what they needed.

## 2019-02-08 DIAGNOSIS — L820 Inflamed seborrheic keratosis: Secondary | ICD-10-CM | POA: Diagnosis not present

## 2019-02-08 DIAGNOSIS — L304 Erythema intertrigo: Secondary | ICD-10-CM | POA: Diagnosis not present

## 2019-02-08 MED FILL — KETOCONAZOLE 2% CREAM: 2 | 30 days supply | Qty: 60 | Fill #0

## 2019-02-13 ENCOUNTER — Encounter: Payer: Self-pay | Admitting: Rheumatology

## 2019-02-13 ENCOUNTER — Other Ambulatory Visit: Payer: Self-pay

## 2019-02-13 ENCOUNTER — Ambulatory Visit: Payer: 59 | Admitting: Rheumatology

## 2019-02-13 VITALS — BP 155/85 | HR 74 | Resp 14 | Ht 69.0 in | Wt 262.4 lb

## 2019-02-13 DIAGNOSIS — Z86711 Personal history of pulmonary embolism: Secondary | ICD-10-CM

## 2019-02-13 DIAGNOSIS — M19041 Primary osteoarthritis, right hand: Secondary | ICD-10-CM

## 2019-02-13 DIAGNOSIS — M170 Bilateral primary osteoarthritis of knee: Secondary | ICD-10-CM | POA: Diagnosis not present

## 2019-02-13 DIAGNOSIS — M19071 Primary osteoarthritis, right ankle and foot: Secondary | ICD-10-CM | POA: Diagnosis not present

## 2019-02-13 DIAGNOSIS — Z862 Personal history of diseases of the blood and blood-forming organs and certain disorders involving the immune mechanism: Secondary | ICD-10-CM

## 2019-02-13 DIAGNOSIS — R768 Other specified abnormal immunological findings in serum: Secondary | ICD-10-CM

## 2019-02-13 DIAGNOSIS — I493 Ventricular premature depolarization: Secondary | ICD-10-CM

## 2019-02-13 DIAGNOSIS — M19072 Primary osteoarthritis, left ankle and foot: Secondary | ICD-10-CM

## 2019-02-13 DIAGNOSIS — Z86718 Personal history of other venous thrombosis and embolism: Secondary | ICD-10-CM

## 2019-02-13 DIAGNOSIS — M19042 Primary osteoarthritis, left hand: Secondary | ICD-10-CM

## 2019-02-13 DIAGNOSIS — I10 Essential (primary) hypertension: Secondary | ICD-10-CM

## 2019-02-13 DIAGNOSIS — Z8679 Personal history of other diseases of the circulatory system: Secondary | ICD-10-CM

## 2019-02-13 DIAGNOSIS — Z79899 Other long term (current) drug therapy: Secondary | ICD-10-CM | POA: Diagnosis not present

## 2019-02-13 DIAGNOSIS — M0579 Rheumatoid arthritis with rheumatoid factor of multiple sites without organ or systems involvement: Secondary | ICD-10-CM | POA: Diagnosis not present

## 2019-02-13 DIAGNOSIS — E785 Hyperlipidemia, unspecified: Secondary | ICD-10-CM

## 2019-02-13 MED FILL — LOSARTAN POTASSIUM 25 MG TA: 25 | 30 days supply | Qty: 30 | Fill #8

## 2019-02-13 NOTE — Patient Instructions (Signed)
Standing Labs We placed an order today for your standing lab work.    Please come back and get your standing labs in last week of June and then every 3 months  We have open lab daily Monday through Thursday from 8:30-12:30 PM and 1:30-4:30 PM and Friday from 8:30-12:30 PM and 1:30 -4:00 PM at the office of Dr. Bo Merino.   You may experience shorter wait times on Monday and Friday afternoons. The office is located at [address], [place], [place],  [address] No appointment is necessary.   Labs are drawn by [facility].  You may receive a bill from [facility] for your lab work.  If you wish to have your labs drawn at another location, please call the office 24 hours in advance to send orders.  If you have any questions regarding directions or hours of operation,  please call [phone].   Just as a reminder please drink plenty of water prior to coming for your lab work. Thanks!

## 2019-03-01 MED FILL — HYDROXYCHLOROQUINE SULFATE: 200 | 30 days supply | Qty: 60 | Fill #1

## 2019-03-20 MED FILL — XARELTO 20 MG TABLET: 20 | 90 days supply | Qty: 90 | Fill #3

## 2019-03-20 MED FILL — LOSARTAN POTASSIUM 25 MG TA: 25 | 30 days supply | Qty: 30 | Fill #9

## 2019-03-20 MED FILL — NADOLOL 20 MG TAB: 20 | 90 days supply | Qty: 90 | Fill #3

## 2019-04-05 MED FILL — HYDROXYCHLOROQUINE 200 MG T: 200 | 30 days supply | Qty: 60 | Fill #2

## 2019-04-17 ENCOUNTER — Other Ambulatory Visit: Payer: Self-pay | Admitting: Obstetrics

## 2019-04-17 DIAGNOSIS — L0232 Furuncle of buttock: Secondary | ICD-10-CM

## 2019-04-17 DIAGNOSIS — F418 Other specified anxiety disorders: Secondary | ICD-10-CM

## 2019-04-17 MED ORDER — AMOXICILLIN-POT CLAVULANATE 875-125 MG PO TABS: 1.0000 | ORAL_TABLET | Freq: Two times a day (BID) | ORAL | 1 refills | Status: DC

## 2019-04-17 MED ORDER — DIAZEPAM 5 MG PO TABS: 5.0000 mg | ORAL_TABLET | Freq: Every evening | ORAL | 2 refills | Status: DC | PRN

## 2019-04-17 MED FILL — diazePAM 5 MG TABS: 5 | 30 days supply | Qty: 30 | Fill #0

## 2019-04-17 MED FILL — AMOX-CLAV 875-125 MG TABLET: 875-125 | 7 days supply | Qty: 14 | Fill #0

## 2019-04-18 MED FILL — LOSARTAN POTASSIUM 25 MG TA: 25 | 30 days supply | Qty: 30 | Fill #10

## 2019-05-08 ENCOUNTER — Telehealth: Payer: Self-pay | Admitting: Rheumatology

## 2019-05-08 DIAGNOSIS — M3390 Dermatopolymyositis, unspecified, organ involvement unspecified: Secondary | ICD-10-CM

## 2019-05-08 DIAGNOSIS — M0579 Rheumatoid arthritis with rheumatoid factor of multiple sites without organ or systems involvement: Secondary | ICD-10-CM

## 2019-05-08 MED ORDER — HYDROXYCHLOROQUINE SULFATE 200 MG PO TABS: 200.0000 mg | ORAL_TABLET | Freq: Two times a day (BID) | ORAL | 0 refills | Status: DC

## 2019-05-08 MED FILL — HYDROXYCHLOROQUINE 200 MG T: 200 | 30 days supply | Qty: 60 | Fill #0

## 2019-05-08 NOTE — Telephone Encounter (Signed)
Patient needs refill on generic Plaquenil, 90 day supply. Sent to [facility]. Patient is down to 8 day supply.

## 2019-05-08 NOTE — Telephone Encounter (Signed)
Last Visit: 02/13/19 Next Visit: 08/14/19 Labs: cbc borderline low hemoglobin/hematocrit on 12/07/2018, CMP 01/15/19 WNL Eye exam: 01/25/19 WNL  Left message to advise patient she is due to update labs.   Okay to refill 30 day supply per Dr. Estanislado Pandy

## 2019-05-11 ENCOUNTER — Other Ambulatory Visit: Payer: Self-pay | Admitting: *Deleted

## 2019-05-11 DIAGNOSIS — M0579 Rheumatoid arthritis with rheumatoid factor of multiple sites without organ or systems involvement: Secondary | ICD-10-CM | POA: Diagnosis not present

## 2019-05-12 LAB — COMPLETE METABOLIC PANEL WITH GFR
AG Ratio: 1.5 (calc) (ref 1.0–2.5)
ALT: 10 U/L (ref 6–29)
AST: 15 U/L (ref 10–35)
Albumin: 4 g/dL (ref 3.6–5.1)
Alkaline phosphatase (APISO): 81 U/L (ref 37–153)
BUN: 13 mg/dL (ref 7–25)
CO2: 26 mmol/L (ref 20–32)
Calcium: 9.4 mg/dL (ref 8.6–10.4)
Chloride: 105 mmol/L (ref 98–110)
Creat: 0.91 mg/dL (ref 0.50–0.99)
GFR, Est African American: 78 mL/min/{1.73_m2} (ref >=60)
GFR, Est Non African American: 67 mL/min/{1.73_m2} (ref >=60)
Globulin: 2.7 g/dL (calc) (ref 1.9–3.7)
Glucose, Bld: 99 mg/dL (ref 65–99)
Potassium: 4.6 mmol/L (ref 3.5–5.3)
Sodium: 139 mmol/L (ref 135–146)
Total Bilirubin: 0.3 mg/dL (ref 0.2–1.2)
Total Protein: 6.7 g/dL (ref 6.1–8.1)

## 2019-05-12 LAB — CBC WITH DIFFERENTIAL/PLATELET
Absolute Monocytes: 424 cells/uL (ref 200–950)
Basophils Absolute: 39 cells/uL (ref 0–200)
Basophils Relative: 0.7 %
Eosinophils Absolute: 132 cells/uL (ref 15–500)
Eosinophils Relative: 2.4 %
HCT: 38.3 % (ref 35.0–45.0)
Hemoglobin: 12.2 g/dL (ref 11.7–15.5)
Lymphs Abs: 1331 cells/uL (ref 850–3900)
MCH: 25.9 pg — ABNORMAL LOW (ref 27.0–33.0)
MCHC: 31.9 g/dL — ABNORMAL LOW (ref 32.0–36.0)
MCV: 81.3 fL (ref 80.0–100.0)
MPV: 9.8 fL (ref 7.5–12.5)
Monocytes Relative: 7.7 %
Neutro Abs: 3575 cells/uL (ref 1500–7800)
Neutrophils Relative %: 65 %
Platelets: 247 10*3/uL (ref 140–400)
RBC: 4.71 10*6/uL (ref 3.80–5.10)
RDW: 13.2 % (ref 11.0–15.0)
Total Lymphocyte: 24.2 %
WBC: 5.5 10*3/uL (ref 3.8–10.8)

## 2019-05-18 MED FILL — HYDROXYCHLOROQUINE 200 MG T: 200 | 30 days supply | Qty: 60 | Fill #0

## 2019-05-18 MED FILL — LOSARTAN POTASSIUM 25 MG TA: 25 | 30 days supply | Qty: 30 | Fill #11

## 2019-06-11 ENCOUNTER — Ambulatory Visit: Payer: 59 | Admitting: Student

## 2019-06-15 NOTE — Progress Notes (Signed)
PCP:  Janith Lima, MD Primary Cardiologist: No primary care provider on file. Electrophysiologist: None   Pamela Davenport is a 63 y.o. female with past medical history of PVCs, paroxysmal atrial fibrillation, and HTN who presents today for routine electrophysiology followup. They are seen for Dr. Rayann Heman.   Since last being seen in our clinic, the patient reports doing very well. She still has occasional palpitations several times a week, but nothing prolonged. She has mild peripheral edema that is worse after she works three 12 hr shifts in a row at [employer], but resolves without intervention. The patient is tolerating medications without difficulties.  She has occasional dizziness with rapid standing, but this is not significant or limiting to her activities. She occasionally gets the same feeling when rapidly turning her head or rolling over in bed.   She denies symptoms of chest pain, shortness of breath, orthopnea, PND, claudication, presyncope, syncope, bleeding, or neurologic sequela. The patient feels that she is tolerating medications without difficulties and is otherwise without complaint today.   Past Medical History:  Diagnosis Date  . Atypical chest pain    normal myoview 11/11/10  . Blood transfusion without reported diagnosis   . DVT (deep venous thrombosis) (HCC)    recurrent; chronically anticoagulated with coumadin  . Family history of anesthesia complication    Sister had decrease in respirations after surgery  . Heart murmur   . Herniated lumbar intervertebral disc   . Hypertension   . Paroxysmal atrial fibrillation (HCC)    SINCE AGE 61   . PE (pulmonary embolism)    chronically anticoagulated with coumadin  . Pneumonia 07/20/12  . PONV (postoperative nausea and vomiting)   . Sickle cell trait ([place])   . Sinus congestion 08/14/2018   C/O NASAL/CHEST  CONGESTION, COUGHING WITH PHLEGM, PER PATIENT , SHE IS TO BEGIN TAKING AUGMENTIN DOSE PACK TODAY;    . Strep throat  at age 68   The patient reports a severe strep throat infection at age 63 and   is not clear as to whether or not she may have had rheumatic fever.  . Symptomatic premature ventricular contractions    improved s/p ablation  . Urgency of urination   . Uterine bleeding 2008   Uterine artery embolization in 2008 for uterine bleeding.   Past Surgical History:  Procedure Laterality Date  . COLONOSCOPY W/ POLYPECTOMY    . Diagnostic D&C hysteroscopy and Novasure ablation  03/17/2004  . DILATION AND CURETTAGE OF UTERUS  12/17/2011   Procedure: DILATATION AND CURETTAGE;  Surgeon: Frederico Hamman, MD;  Location: [facility];  Service: Gynecology;  Laterality: N/A;  With Attempted Hydrothermal Ablation  . LUMBAR LAMINECTOMY/DECOMPRESSION MICRODISCECTOMY Left 02/21/2013   Procedure: LUMBAR LAMINECTOMY/DECOMPRESSION MICRODISCECTOMY 1 LEVEL;  Surgeon: Winfield Cunas, MD;  Location: [facility];  Service: Neurosurgery;  Laterality: Left;  LEFT Lumbar Three-Four Laminotomy foraminotomy microdiskectomy  . MANDIBLE SURGERY     under bite  . PVC Ablation  2010  . ROBOTIC ASSISTED TOTAL HYSTERECTOMY WITH BILATERAL SALPINGO OOPHERECTOMY Bilateral 08/21/2018   Procedure: XI ROBOTIC ASSISTED TOTAL HYSTERECTOMY WITH BILATERAL SALPINGO OOPHORECTOMY;  Surgeon: Everitt Amber, MD;  Location: [facility];  Service: Gynecology;  Laterality: Bilateral;  . SPINE SURGERY    . TONSILLECTOMY    . UTERINE ARTERY EMBOLIZATION  2008   for uterine bleeding  . VASCULAR SURGERY     laser of left leg    Current Outpatient Medications  Medication Sig Dispense  Refill  . diazepam (VALIUM) 5 MG tablet Take 1 tablet (5 mg total) by mouth at bedtime as needed for anxiety. 30 tablet 2  . flecainide (TAMBOCOR) 150 MG tablet Take 2 tablets by mouth at the onset of AFIB 30 tablet 3  . hydroxychloroquine (PLAQUENIL) 200 MG tablet Take 1 tablet (200 mg total) by mouth 2 (two) times daily. 60 tablet 0  . ketoconazole  (NIZORAL) 2 % cream as needed.    Marland Kitchen losartan (COZAAR) 25 MG tablet Take 1 tablet (25 mg total) by mouth daily. 90 tablet 3  . nadolol (CORGARD) 20 MG tablet Take 1 tablet (20 mg total) by mouth daily. 90 tablet 3  . rivaroxaban (XARELTO) 20 MG TABS tablet TAKE 1 TABLET BY MOUTH DAILY WITH SUPPER. 90 tablet 3   No current facility-administered medications for this visit.     Allergies  Allergen Reactions  . Methylprednisolone     REACTION: Atrial fibrillation at end of medrol dose pack  . Sulfa Antibiotics Hives  . Sulfonamide Derivatives Hives    Social History   Socioeconomic History  . Marital status: Single    Spouse name: Not on file  . Number of children: Not on file  . Years of education: Not on file  . Highest education level: Not on file  Occupational History  . Occupation: Nurse  Social Needs  . Financial resource strain: Not on file  . Food insecurity    Worry: Not on file    Inability: Not on file  . Transportation needs    Medical: Not on file    Non-medical: Not on file  Tobacco Use  . Smoking status: Former Smoker    Packs/day: 1.00    Years: 2.00    Pack years: 2.00    Types: Cigarettes    Quit date: 02/13/1979    Years since quitting: 40.3  . Smokeless tobacco: Never Used  Substance and Sexual Activity  . Alcohol use: Yes    Comment: rarely  . Drug use: No  . Sexual activity: Not on file  Lifestyle  . Physical activity    Days per week: Not on file    Minutes per session: Not on file  . Stress: Not on file  Relationships  . Social Herbalist on phone: Not on file    Gets together: Not on file    Attends religious service: Not on file    Active member of club or organization: Not on file    Attends meetings of clubs or organizations: Not on file    Relationship status: Not on file  . Intimate partner violence    Fear of current or ex partner: Not on file    Emotionally abused: Not on file    Physically abused: Not on file     Forced sexual activity: Not on file  Other Topics Concern  . 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  Physical Exam: Vitals:   06/18/19 0950  BP: 132/86  Pulse: 71  SpO2: 99%  Weight: 270 lb 6.4 oz (122.7 kg)  Height: 5\' 9"  (1.753 m)    GEN- The patient is well appearing, alert and oriented x 3 today.   HEENT: normocephalic, atraumatic; sclera clear, conjunctiva pink; hearing intact; oropharynx clear; neck supple, no JVP Lymph- no cervical lymphadenopathy Lungs- Clear to ausculation bilaterally, normal work of breathing.  No wheezes, rales, rhonchi Heart- Regular rate and rhythm, no murmurs, rubs or gallops, PMI not laterally displaced GI- soft, non-tender, non-distended, bowel sounds present, no hepatosplenomegaly Extremities- no clubbing, cyanosis, or edema; DP/PT/radial pulses 2+ bilaterally MS- no significant deformity or atrophy Skin- warm and dry, no rash or lesion Psych- euthymic mood, full affect Neuro- strength and sensation are intact  EKG is ordered. Personal review of EKG from today shows NSR 71 bpm, PR 182 ms  Assessment and Plan:  1. PVCs Stable. Continue flecainide and Nadolol. EKG stable. Recent labs stable.  No change required today.  Will update Echo for completeness.   2. HTN Continue current regimen. She takes all of her medications in the evening.   3. Obesity Body mass index is  39.93 kg/m.   4. Atrial fibrillation Well controlled.  On Xarelto given prior recurrent DVT/PTE and CHA2DS2VASC of 2.  Doing well overall. Recent labwork reviewed and stable. RTC 1 year, sooner with symptoms.   Shirley Friar, PA-C  06/18/19 9:56 AM

## 2019-06-18 ENCOUNTER — Other Ambulatory Visit: Payer: Self-pay

## 2019-06-18 ENCOUNTER — Encounter: Payer: Self-pay | Admitting: Student

## 2019-06-18 ENCOUNTER — Ambulatory Visit: Payer: 59 | Admitting: Student

## 2019-06-18 VITALS — BP 132/86 | HR 71 | Ht 69.0 in | Wt 270.4 lb

## 2019-06-18 DIAGNOSIS — I10 Essential (primary) hypertension: Secondary | ICD-10-CM | POA: Diagnosis not present

## 2019-06-18 DIAGNOSIS — I480 Paroxysmal atrial fibrillation: Secondary | ICD-10-CM | POA: Diagnosis not present

## 2019-06-18 DIAGNOSIS — I493 Ventricular premature depolarization: Secondary | ICD-10-CM | POA: Diagnosis not present

## 2019-06-18 MED ORDER — NADOLOL 20 MG PO TABS: 20.0000 mg | ORAL_TABLET | Freq: Every day | ORAL | 3 refills | Status: DC

## 2019-06-18 MED ORDER — RIVAROXABAN 20 MG PO TABS: ORAL_TABLET | ORAL | 3 refills | Status: DC

## 2019-06-18 MED ORDER — LOSARTAN POTASSIUM 25 MG PO TABS: 25.0000 mg | ORAL_TABLET | Freq: Every day | ORAL | 3 refills | Status: DC

## 2019-06-18 MED FILL — NADOLOL 20 MG TABS: 20 | 90 days supply | Qty: 90 | Fill #0

## 2019-06-18 MED FILL — XARELTO 20 MG TABLET: 20 | 90 days supply | Qty: 90 | Fill #0

## 2019-06-18 MED FILL — LOSARTAN POTASSIUM 25 MG TA: 25 | 30 days supply | Qty: 30 | Fill #0

## 2019-06-18 NOTE — Patient Instructions (Signed)
Medication Instructions:   Your physician recommends that you continue on your current medications as directed. Please refer to the Current Medication list given to you today.  If you need a refill on your cardiac medications before your next appointment, please call your pharmacy*  Lab Work:  None ordered today  Testing/Procedures:  Your physician has requested that you have an echocardiogram. Echocardiography is a painless test that uses sound waves to create images of your heart. It provides your doctor with information about the size and shape of your heart and how well your heart's chambers and valves are working. This procedure takes approximately one hour. There are no restrictions for this procedure.  Follow-Up: At [facility], you and your health needs are our priority.  As part of our continuing mission to provide you with exceptional heart care, we have created designated Provider Care Teams.  These Care Teams include your primary Cardiologist (physician) and Advanced Practice Providers (APPs -  Physician Assistants and Nurse Practitioners) who all work together to provide you with the care you need, when you need it.  Your next appointment:   12 months  The format for your next appointment:   Either In Person or Virtual  Provider:   You may see Thompson Grayer, MD or one of the following Advanced Practice Providers on your designated Care Team:    Chanetta Marshall, NP  Tommye Standard, PA-C  Legrand Como "[place]" [place], [place]

## 2019-06-18 NOTE — Telephone Encounter (Signed)
Xarelto 20mg  refill request received; pt is 63 years old, weight-122.7kg, Crea-0.91 on 05/11/2019, last seen by Jonni Sanger on today-06/18/2019, Diagnosis-Afib, CrCl-122.58ml/min. Dose is appropriate based on dosing criteria. Will send in refill to requested pharmacy.

## 2019-06-29 MED FILL — AMOX-CLAV 875-125 MG TABLET: 875-125 | 7 days supply | Qty: 14 | Fill #1

## 2019-07-02 ENCOUNTER — Ambulatory Visit (HOSPITAL_COMMUNITY): Payer: 59 | Attending: Internal Medicine

## 2019-07-02 ENCOUNTER — Other Ambulatory Visit: Payer: Self-pay

## 2019-07-02 DIAGNOSIS — I493 Ventricular premature depolarization: Secondary | ICD-10-CM | POA: Diagnosis not present

## 2019-07-11 DIAGNOSIS — N952 Postmenopausal atrophic vaginitis: Secondary | ICD-10-CM | POA: Diagnosis not present

## 2019-07-11 DIAGNOSIS — B373 Candidiasis of vulva and vagina: Secondary | ICD-10-CM | POA: Diagnosis not present

## 2019-07-11 DIAGNOSIS — N898 Other specified noninflammatory disorders of vagina: Secondary | ICD-10-CM | POA: Diagnosis not present

## 2019-07-11 DIAGNOSIS — N760 Acute vaginitis: Secondary | ICD-10-CM | POA: Diagnosis not present

## 2019-07-11 MED FILL — NYSTATIN-TRIAMCINOLONE OINT: 100000-0.1 | 10 days supply | Qty: 60 | Fill #0

## 2019-07-11 MED FILL — PREMARIN VAGINAL CREAM-APPL: 0.625 | 90 days supply | Qty: 30 | Fill #0

## 2019-07-18 ENCOUNTER — Telehealth: Payer: Self-pay | Admitting: Rheumatology

## 2019-07-18 ENCOUNTER — Other Ambulatory Visit: Payer: Self-pay | Admitting: Rheumatology

## 2019-07-18 DIAGNOSIS — M0579 Rheumatoid arthritis with rheumatoid factor of multiple sites without organ or systems involvement: Secondary | ICD-10-CM

## 2019-07-18 MED FILL — HYDROXYCHLOROQUINE 200 MG T: 200 | 90 days supply | Qty: 180 | Fill #0

## 2019-07-18 MED FILL — LOSARTAN POTASSIUM 25 MG TA: 25 | 90 days supply | Qty: 90 | Fill #1

## 2019-07-18 MED FILL — CHLORHEXIDINE 0.12% RINSE: 0.12 | 16 days supply | Qty: 473 | Fill #0

## 2019-07-18 NOTE — Telephone Encounter (Signed)
Prescription sent to the pharmacy. Patient advised.  

## 2019-07-18 NOTE — Telephone Encounter (Signed)
Patient left a voicemail requesting prescription refill of Plaquenil to be sent to [facility].  Patient states she only has 2 pills remaining and requesting the prescription be sent ASAP.

## 2019-07-18 NOTE — Telephone Encounter (Signed)
Last Visit: 02/13/19 Next Visit: 08/14/19  Labs: 05/11/19 CMP WNL. MCH and MCHC low. Rest of CBC WNL. Eye exam:  01/25/19 WNL   Okay to refill per Dr. Estanislado Pandy

## 2019-08-01 DIAGNOSIS — L821 Other seborrheic keratosis: Secondary | ICD-10-CM | POA: Diagnosis not present

## 2019-08-01 DIAGNOSIS — N9089 Other specified noninflammatory disorders of vulva and perineum: Secondary | ICD-10-CM | POA: Diagnosis not present

## 2019-08-13 NOTE — Progress Notes (Signed)
Virtual Visit via Telephone Note  I connected with Pamela Davenport on 08/14/19 at  2:45 PM EST by telephone and verified that I am speaking with the correct person using two identifiers.  Location: Patient: Home  Provider: Clinic  This service was conducted via virtual visit.   The patient was located at home. I was located in my office.  Consent was obtained prior to the virtual visit and is aware of possible charges through their insurance for this visit.  The patient is an established patient.  Dr. Estanislado Pandy, MD conducted the virtual visit and Hazel Sams, PA-C acted as scribe during the service.  Office staff helped with scheduling follow up visits after the service was conducted.     I discussed the limitations, risks, security and privacy concerns of performing an evaluation and management service by telephone and the availability of in person appointments. I also discussed with the patient that there may be a patient responsible charge related to this service. The patient expressed understanding and agreed to proceed.  CC: Joint stiffness  History of Present Illness: Patient is a 63 year old female with a past medical history of seropositive rheumatoid arthritis and osteoarthritis.  She is taking plaquenil 200 mg 1 tablet by mouth twice daily.  She denies any recent rheumatoid arthritis flares.  She has occasional arthralgias and stiffness but no joint inflammation.  She states her knee joint pain has improved. She continues to have discomfort climbing steps. She continues to have chronic fatigue related to insomnia.   Review of Systems  Constitutional: Positive for malaise/fatigue. Negative for fever.  Eyes: Negative for photophobia, pain, discharge and redness.  Respiratory: Negative for cough, shortness of breath and wheezing.   Cardiovascular: Negative for chest pain and palpitations.  Gastrointestinal: Negative for blood in stool, constipation and diarrhea.  Genitourinary: Negative  for dysuria.  Musculoskeletal: Positive for joint pain. Negative for back pain, myalgias and neck pain.       +Morning stiffness Denies joint swelling   Skin: Negative for rash.  Neurological: Negative for dizziness and headaches.  Psychiatric/Behavioral: Negative for depression. The patient has insomnia. The patient is not nervous/anxious.       Observations/Objective: Physical Exam  Constitutional: She is oriented to person, place, and time.  Neurological: She is alert and oriented to person, place, and time.  Psychiatric: Mood, memory, affect and judgment normal.   Patient reports morning stiffness for 5 minutes.   Patient denies nocturnal pain.  Difficulty dressing/grooming: Denies Difficulty climbing stairs: Reports Difficulty getting out of chair: Denies Difficulty using hands for taps, buttons, cutlery, and/or writing: Denies   She denies any changes in medications/    Assessment and Plan: Visit Diagnoses: Rheumatoid arthritis with rheumatoid factor of multiple sites without organ or systems involvement (HCC) - RF 74, CCP >250 ,Sed rate 45, ANA 1: 320 cytoplasmic, positive synovitis: She has not had any recent rheumatoid arthritis flares.  She is clinically doing well on Plaquenil 200 mg 1 tablet by mouth twice daily.  She is tolerating PLQ without any side effects.  She has occasional arthralgias and joint stiffness but no inflammation at this time.  She has some discomfort in both knee joints when climbing steps but does not have any other concerns at this time.  She will continue to take plaquenil as prescribed.  She will follow up in 4-5 months.    High risk medication use - PLQ 200 mg BID-Started in May 2020. Last Plaquenil eye exam normal  on 01/25/2019.  CBC and CMP were drawn on 05/11/19.  She will be due to update lab work in February and every 5 months.   Primary osteoarthritis of both hands -She is not experiencing any discomfort in her hands at this time.  No joint  swelling.  Joint protection and muscle strengthening were discussed.   Primary osteoarthritis of both knees - mild osteoarthritis and mild chondromalacia patella: She is not experiencing any discomfort currently.  No joint swelling. She has discomfort when climbing steps but she is able to do it without difficulty.   Primary osteoarthritis of both feet: She has no discomfort in her feet at this time.   Positive ANA (antinuclear antibody) - ENA, C3-C4, anticardiolipin, lupus anticoagulant, beta-2 GP 1-were all negative. She has no other clinical features of autoimmune disease at this time.  Other medical conditions are listed as follows:   History of DVT (deep vein thrombosis)   History of pulmonary embolism   History of atrial fibrillation  PVC's (premature ventricular contractions)   Hyperlipidemia with target LDL less than 130   Essential hypertension   History of anemia   Follow Up Instructions: She will follow up in 4-5 months    I discussed the assessment and treatment plan with the patient. The patient was provided an opportunity to ask questions and all were answered. The patient agreed with the plan and demonstrated an understanding of the instructions.   The patient was advised to call back or seek an in-person evaluation if the symptoms worsen or if the condition fails to improve as anticipated.  I provided 15 minutes of non-face-to-face time during this encounter.   Bo Merino, MD   Scribed by-  Hazel Sams, PA-C

## 2019-08-14 ENCOUNTER — Encounter: Payer: Self-pay | Admitting: Rheumatology

## 2019-08-14 ENCOUNTER — Other Ambulatory Visit: Payer: Self-pay

## 2019-08-14 ENCOUNTER — Telehealth (INDEPENDENT_AMBULATORY_CARE_PROVIDER_SITE_OTHER): Payer: 59 | Admitting: Rheumatology

## 2019-08-14 DIAGNOSIS — Z86711 Personal history of pulmonary embolism: Secondary | ICD-10-CM

## 2019-08-14 DIAGNOSIS — M0579 Rheumatoid arthritis with rheumatoid factor of multiple sites without organ or systems involvement: Secondary | ICD-10-CM | POA: Diagnosis not present

## 2019-08-14 DIAGNOSIS — M19071 Primary osteoarthritis, right ankle and foot: Secondary | ICD-10-CM

## 2019-08-14 DIAGNOSIS — R768 Other specified abnormal immunological findings in serum: Secondary | ICD-10-CM

## 2019-08-14 DIAGNOSIS — Z79899 Other long term (current) drug therapy: Secondary | ICD-10-CM | POA: Diagnosis not present

## 2019-08-14 DIAGNOSIS — M170 Bilateral primary osteoarthritis of knee: Secondary | ICD-10-CM

## 2019-08-14 DIAGNOSIS — M19072 Primary osteoarthritis, left ankle and foot: Secondary | ICD-10-CM

## 2019-08-14 DIAGNOSIS — Z862 Personal history of diseases of the blood and blood-forming organs and certain disorders involving the immune mechanism: Secondary | ICD-10-CM

## 2019-08-14 DIAGNOSIS — Z86718 Personal history of other venous thrombosis and embolism: Secondary | ICD-10-CM

## 2019-08-14 DIAGNOSIS — M19041 Primary osteoarthritis, right hand: Secondary | ICD-10-CM | POA: Diagnosis not present

## 2019-08-14 DIAGNOSIS — E785 Hyperlipidemia, unspecified: Secondary | ICD-10-CM

## 2019-08-14 DIAGNOSIS — I10 Essential (primary) hypertension: Secondary | ICD-10-CM

## 2019-08-14 DIAGNOSIS — Z8679 Personal history of other diseases of the circulatory system: Secondary | ICD-10-CM

## 2019-08-14 DIAGNOSIS — I493 Ventricular premature depolarization: Secondary | ICD-10-CM

## 2019-08-14 DIAGNOSIS — M19042 Primary osteoarthritis, left hand: Secondary | ICD-10-CM

## 2019-09-14 MED FILL — NADOLOL 20 MG TABS: 20 | 90 days supply | Qty: 90 | Fill #1

## 2019-09-14 MED FILL — diazePAM 5 MG TABS: 5 | 30 days supply | Qty: 30 | Fill #1

## 2019-09-24 ENCOUNTER — Encounter: Payer: Self-pay | Admitting: Internal Medicine

## 2019-09-24 ENCOUNTER — Ambulatory Visit: Payer: 59 | Admitting: Internal Medicine

## 2019-09-24 ENCOUNTER — Other Ambulatory Visit: Payer: Self-pay

## 2019-09-24 ENCOUNTER — Ambulatory Visit (INDEPENDENT_AMBULATORY_CARE_PROVIDER_SITE_OTHER): Payer: 59

## 2019-09-24 ENCOUNTER — Other Ambulatory Visit: Payer: 59

## 2019-09-24 ENCOUNTER — Ambulatory Visit: Payer: 59 | Admitting: Family

## 2019-09-24 VITALS — BP 160/90 | HR 60 | Temp 97.9°F | Ht 69.0 in | Wt 270.0 lb

## 2019-09-24 DIAGNOSIS — Z0000 Encounter for general adult medical examination without abnormal findings: Secondary | ICD-10-CM

## 2019-09-24 DIAGNOSIS — I10 Essential (primary) hypertension: Secondary | ICD-10-CM | POA: Diagnosis not present

## 2019-09-24 DIAGNOSIS — R0781 Pleurodynia: Secondary | ICD-10-CM

## 2019-09-24 DIAGNOSIS — D6859 Other primary thrombophilia: Secondary | ICD-10-CM

## 2019-09-24 DIAGNOSIS — D509 Iron deficiency anemia, unspecified: Secondary | ICD-10-CM

## 2019-09-24 DIAGNOSIS — E559 Vitamin D deficiency, unspecified: Secondary | ICD-10-CM | POA: Diagnosis not present

## 2019-09-24 DIAGNOSIS — I480 Paroxysmal atrial fibrillation: Secondary | ICD-10-CM

## 2019-09-24 DIAGNOSIS — D513 Other dietary vitamin B12 deficiency anemia: Secondary | ICD-10-CM | POA: Diagnosis not present

## 2019-09-24 DIAGNOSIS — L739 Follicular disorder, unspecified: Secondary | ICD-10-CM

## 2019-09-24 DIAGNOSIS — Z1231 Encounter for screening mammogram for malignant neoplasm of breast: Secondary | ICD-10-CM

## 2019-09-24 DIAGNOSIS — R079 Chest pain, unspecified: Secondary | ICD-10-CM | POA: Diagnosis not present

## 2019-09-24 LAB — URINALYSIS, ROUTINE W REFLEX MICROSCOPIC
Bilirubin Urine: NEGATIVE
Hgb urine dipstick: NEGATIVE
Ketones, ur: NEGATIVE
Leukocytes,Ua: NEGATIVE
Nitrite: NEGATIVE
RBC / HPF: NONE SEEN
Specific Gravity, Urine: 1.025 (ref 1.000–1.030)
Total Protein, Urine: NEGATIVE
Urine Glucose: NEGATIVE
Urobilinogen, UA: 0.2 (ref 0.0–1.0)
pH: 6 (ref 5.0–8.0)

## 2019-09-24 LAB — CBC WITH DIFFERENTIAL/PLATELET
Basophils Absolute: 0 10*3/uL (ref 0.0–0.1)
Basophils Relative: 0.8 % (ref 0.0–3.0)
Eosinophils Absolute: 0.2 10*3/uL (ref 0.0–0.7)
Eosinophils Relative: 2.9 % (ref 0.0–5.0)
HCT: 38.4 % (ref 36.0–46.0)
Hemoglobin: 12.5 g/dL (ref 12.0–15.0)
Lymphocytes Relative: 22.8 % (ref 12.0–46.0)
Lymphs Abs: 1.4 10*3/uL (ref 0.7–4.0)
MCHC: 32.6 g/dL (ref 30.0–36.0)
MCV: 81 fl (ref 78.0–100.0)
Monocytes Absolute: 0.4 10*3/uL (ref 0.1–1.0)
Monocytes Relative: 6 % (ref 3.0–12.0)
Neutro Abs: 4 10*3/uL (ref 1.4–7.7)
Neutrophils Relative %: 67.5 % (ref 43.0–77.0)
Platelets: 255 10*3/uL (ref 150.0–400.0)
RBC: 4.74 Mil/uL (ref 3.87–5.11)
RDW: 14.8 % (ref 11.5–15.5)
WBC: 5.9 10*3/uL (ref 4.0–10.5)

## 2019-09-24 LAB — BASIC METABOLIC PANEL
BUN: 14 mg/dL (ref 6–23)
CO2: 28 mEq/L (ref 19–32)
Calcium: 9.3 mg/dL (ref 8.4–10.5)
Chloride: 106 mEq/L (ref 96–112)
Creatinine, Ser: 0.84 mg/dL (ref 0.40–1.20)
GFR: 82.74 mL/min (ref >60.00)
Glucose, Bld: 86 mg/dL (ref 70–99)
Potassium: 4.1 mEq/L (ref 3.5–5.1)
Sodium: 139 mEq/L (ref 135–145)

## 2019-09-24 LAB — LIPID PANEL
Cholesterol: 162 mg/dL (ref 0–200)
HDL: 44.4 mg/dL (ref >39.00)
LDL Cholesterol: 102 mg/dL — ABNORMAL HIGH (ref 0–99)
NonHDL: 117.92
Total CHOL/HDL Ratio: 4
Triglycerides: 78 mg/dL (ref 0.0–149.0)
VLDL: 15.6 mg/dL (ref 0.0–40.0)

## 2019-09-24 LAB — IBC PANEL
Iron: 64 ug/dL (ref 42–145)
Saturation Ratios: 20.9 % (ref 20.0–50.0)
Transferrin: 219 mg/dL (ref 212.0–360.0)

## 2019-09-24 LAB — TSH: TSH: 1.87 u[IU]/mL (ref 0.35–4.50)

## 2019-09-24 MED ORDER — INDAPAMIDE 1.25 MG PO TABS: 1.2500 mg | ORAL_TABLET | Freq: Every day | ORAL | 0 refills | Status: DC

## 2019-09-24 MED ORDER — DOXYCYCLINE HYCLATE 100 MG PO TABS: 100.0000 mg | ORAL_TABLET | Freq: Two times a day (BID) | ORAL | 0 refills | Status: DC

## 2019-09-24 MED FILL — INDAPAMIDE 1.25 MG TABLET: 1.25 | 90 days supply | Qty: 90 | Fill #0

## 2019-09-24 MED FILL — XARELTO 20 MG TABLET: 20 | 90 days supply | Qty: 90 | Fill #1

## 2019-09-24 MED FILL — DOXYCYCLINE HYCLATE 100 MG: 100 | 10 days supply | Qty: 20 | Fill #0

## 2019-09-24 NOTE — Progress Notes (Signed)
Pamela Davenport is a 64 y.o. female with the following history as recorded in EpicCare:  Patient Active Problem List   Diagnosis Date Noted  . Chest pain, pleuritic 09/24/2019  . Hypercoagulable state ([place]) 09/24/2019  . Iron deficiency anemia 12/07/2018  . Other dietary vitamin B12 deficiency anemia 12/07/2018  . Fibroid uterus 08/21/2018  . De Quervain's tenosynovitis 03/01/2018  . Visit for screening mammogram 04/18/2017  . Cervical cancer screening 04/18/2017  . Hyperlipidemia with target LDL less than 130 10/19/2016  . Routine general medical examination at a health care facility 02/03/2015  . Essential hypertension 01/13/2015  . Chronic pulmonary embolism ([place]) 12/05/2008  . ATRIAL FIBRILLATION 12/05/2008  . PVC's (premature ventricular contractions) 12/05/2008  . DVT 12/05/2008    Current Outpatient Medications  Medication Sig Dispense Refill  . chlorhexidine (PERIDEX) 0.12 % solution     . diazepam (VALIUM) 5 MG tablet Take 1 tablet (5 mg total) by mouth at bedtime as needed for anxiety. (Patient not taking: Reported on 09/24/2019) 30 tablet 2  . flecainide (TAMBOCOR) 150 MG tablet Take 2 tablets by mouth at the onset of AFIB 30 tablet 3  . hydroxychloroquine (PLAQUENIL) 200 MG tablet TAKE 1 TABLET (200 MG TOTAL) BY MOUTH 2 (TWO) TIMES DAILY. 180 tablet 0  . ketoconazole (NIZORAL) 2 % cream as needed.    Marland Kitchen losartan (COZAAR) 25 MG tablet Take 1 tablet (25 mg total) by mouth daily. 90 tablet 3  . nadolol (CORGARD) 20 MG tablet Take 1 tablet (20 mg total) by mouth daily. 90 tablet 3  . nystatin-triamcinolone ointment (MYCOLOG)     . rivaroxaban (XARELTO) 20 MG TABS tablet TAKE 1 TABLET BY MOUTH DAILY WITH SUPPER. 90 tablet 3   No current facility-administered medications for this visit.    Allergies: Methylprednisolone, Sulfa antibiotics, and Sulfonamide derivatives  Past Medical History:  Diagnosis Date  . Atypical chest pain    normal myoview 11/11/10  . Blood transfusion  without reported diagnosis   . DVT (deep venous thrombosis) (HCC)    recurrent; chronically anticoagulated with coumadin  . Family history of anesthesia complication    Sister had decrease in respirations after surgery  . Heart murmur   . Herniated lumbar intervertebral disc   . Hypertension   . Paroxysmal atrial fibrillation (HCC)    SINCE AGE 50   . PE (pulmonary embolism)    chronically anticoagulated with coumadin  . Pneumonia 07/20/12  . PONV (postoperative nausea and vomiting)   . Sickle cell trait ([place])   . Sinus congestion 08/14/2018   C/O NASAL/CHEST  CONGESTION, COUGHING WITH PHLEGM, PER PATIENT , SHE IS TO BEGIN TAKING AUGMENTIN DOSE PACK TODAY;    . Strep throat at age 37   The patient reports a severe strep throat infection at age 14 and   is not clear as to whether or not she may have had rheumatic fever.  . Symptomatic premature ventricular contractions    improved s/p ablation  . Urgency of urination   . Uterine bleeding 2008   Uterine artery embolization in 2008 for uterine bleeding.    Past Surgical History:  Procedure Laterality Date  . COLONOSCOPY W/ POLYPECTOMY    . Diagnostic D&C hysteroscopy and Novasure ablation  03/17/2004  . DILATION AND CURETTAGE OF UTERUS  12/17/2011   Procedure: DILATATION AND CURETTAGE;  Surgeon: Frederico Hamman, MD;  Location: [facility];  Service: Gynecology;  Laterality: N/A;  With Attempted Hydrothermal Ablation  . LUMBAR LAMINECTOMY/DECOMPRESSION MICRODISCECTOMY  Left 02/21/2013   Procedure: LUMBAR LAMINECTOMY/DECOMPRESSION MICRODISCECTOMY 1 LEVEL;  Surgeon: Winfield Cunas, MD;  Location: [facility];  Service: Neurosurgery;  Laterality: Left;  LEFT Lumbar Three-Four Laminotomy foraminotomy microdiskectomy  . MANDIBLE SURGERY     under bite  . PVC Ablation  2010  . ROBOTIC ASSISTED TOTAL HYSTERECTOMY WITH BILATERAL SALPINGO OOPHERECTOMY Bilateral 08/21/2018   Procedure: XI ROBOTIC ASSISTED TOTAL HYSTERECTOMY WITH BILATERAL  SALPINGO OOPHORECTOMY;  Surgeon: Everitt Amber, MD;  Location: [facility];  Service: Gynecology;  Laterality: Bilateral;  . SPINE SURGERY    . TONSILLECTOMY    . UTERINE ARTERY EMBOLIZATION  2008   for uterine bleeding  . VASCULAR SURGERY     laser of left leg    Family History  Problem Relation Age of Onset  . Hypertension Mother   . Diabetes Mother   . Uterine cancer Sister   . Prostate cancer Brother   . Prostate cancer Paternal Grandmother   . Colon cancer Paternal Grandfather   . Prostate cancer Brother   . Prostate cancer Brother   . Healthy Son     Social History   Tobacco Use  . Smoking status: Former Smoker    Packs/day: 1.00    Years: 2.00    Pack years: 2.00    Types: Cigarettes    Quit date: 02/13/1979    Years since quitting: 40.6  . Smokeless tobacco: Never Used  Substance Use Topics  . Alcohol use: Yes    Comment: rarely    Subjective:   I connected with Pamela Davenport on 09/24/19 at  9:40 AM EST by a video enabled telemedicine application and verified that I am speaking with the correct person using two identifiers.   I discussed the limitations of evaluation and management by telemedicine and the availability of in person appointments. The patient expressed understanding and agreed to proceed. Provider in office/ patient is at home; provider and patient are only 2 people on video call.     Objective:  There were no vitals filed for this visit.  General: Well developed, well nourished, in no acute distress  Neurologic: Alert and oriented; speech intact; face symmetrical; moves all extremities well; CNII-XII intact without focal deficit   Assessment:  1. Rib pain on left side     Plan:  This appointment was cancelled today and re-scheduled for in office appointment with her PCP today;   No follow-ups on file.  No orders of the defined types were placed in this encounter.   Requested Prescriptions    No prescriptions requested or  ordered in this encounter

## 2019-09-24 NOTE — Patient Instructions (Signed)

## 2019-09-24 NOTE — Progress Notes (Signed)
Subjective:  Patient ID: Pamela Davenport, female    DOB: [birth year]  Age: 64 y.o. MRN: [id number]  CC: Annual Exam, Hypertension, and Chest Pain   This visit occurred during the SARS-CoV-2 public health emergency.  Safety protocols were in place, including screening questions prior to the visit, additional usage of staff PPE, and extensive cleaning of exam room while observing appropriate contact time as indicated for disinfecting solutions.    HPI Pamela Davenport presents for a CPX.  She complains of a 4-day history of left lower anterior lateral chest pain.  She describes it as an achy sensation that becomes sharp with movement and deep breath.  She is also had some DOE but she denies diaphoresis, lightheadedness, dizziness, cough, or hemoptysis.  She complains of her blood pressure is not well controlled and she has had a few headaches recently.  She denies blurred vision.  She complains of a several day history of infected hair follicles in the left pubic region.  They have been intermittently draining a yellowish exudate.    Outpatient Medications Prior to Visit  Medication Sig Dispense Refill  . chlorhexidine (PERIDEX) 0.12 % solution     . flecainide (TAMBOCOR) 150 MG tablet Take 2 tablets by mouth at the onset of AFIB 30 tablet 3  . hydroxychloroquine (PLAQUENIL) 200 MG tablet TAKE 1 TABLET (200 MG TOTAL) BY MOUTH 2 (TWO) TIMES DAILY. 180 tablet 0  . ketoconazole (NIZORAL) 2 % cream as needed.    Marland Kitchen losartan (COZAAR) 25 MG tablet Take 1 tablet (25 mg total) by mouth daily. 90 tablet 3  . nadolol (CORGARD) 20 MG tablet Take 1 tablet (20 mg total) by mouth daily. 90 tablet 3  . nystatin-triamcinolone ointment (MYCOLOG)     . rivaroxaban (XARELTO) 20 MG TABS tablet TAKE 1 TABLET BY MOUTH DAILY WITH SUPPER. 90 tablet 3  . diazepam (VALIUM) 5 MG tablet Take 1 tablet (5 mg total) by mouth at bedtime as needed for anxiety. (Patient not taking: Reported on 09/24/2019) 30 tablet 2   No  facility-administered medications prior to visit.    ROS Review of Systems  Constitutional: Negative.  Negative for chills, diaphoresis, fatigue, fever and unexpected weight change.  HENT: Negative.  Negative for sore throat and trouble swallowing.   Eyes: Negative.   Respiratory: Positive for shortness of breath. Negative for cough, chest tightness, wheezing and stridor.   Cardiovascular: Positive for chest pain. Negative for palpitations and leg swelling.  Gastrointestinal: Negative for abdominal pain, constipation, diarrhea, nausea and vomiting.  Endocrine: Negative.   Genitourinary: Negative.  Negative for dysuria and hematuria.  Musculoskeletal: Positive for arthralgias. Negative for back pain, myalgias and neck pain.  Skin: Negative.  Negative for color change and rash.  Neurological: Positive for headaches. Negative for dizziness, facial asymmetry, weakness, light-headedness and numbness.  Hematological: Negative for adenopathy. Does not bruise/bleed easily.  Psychiatric/Behavioral: Negative.     Objective:  BP (!) 160/90 (BP Location: Left Arm, Patient Position: Sitting, Cuff Size: Large)   Pulse 60   Temp 97.9 F (36.6 C) (Oral)   Ht 5\' 9"  (1.753 m)   Wt 270 lb (122.5 kg)   SpO2 98%   BMI 39.87 kg/m   BP Readings from Last 3 Encounters:  09/24/19 (!) 160/90  06/18/19 132/86  02/13/19 (!) 155/85    Wt Readings from Last 3 Encounters:  09/24/19 270 lb (122.5 kg)  06/18/19 270 lb 6.4 oz (122.7 kg)  02/13/19 262 lb 6.4 oz (  119 kg)    Physical Exam Vitals reviewed.  Constitutional:      General: She is not in acute distress.    Appearance: She is obese. She is not ill-appearing, toxic-appearing or diaphoretic.  HENT:     Nose: Nose normal.  Eyes:     General: No scleral icterus.    Conjunctiva/sclera: Conjunctivae normal.  Cardiovascular:     Rate and Rhythm: Normal rate and regular rhythm.     Pulses: Normal pulses.     Heart sounds: No murmur. No friction  rub. No gallop.      Comments: EKG ---  NSR No LVH No Q waves No ST/T wave changes Nl EKG Pulmonary:     Effort: Pulmonary effort is normal.     Breath sounds: No stridor. No wheezing, rhonchi or rales.  Chest:     Chest wall: Tenderness present. No mass, swelling, crepitus or edema.    Abdominal:     General: Abdomen is flat. Bowel sounds are normal. There is no distension.     Palpations: Abdomen is soft. There is no hepatomegaly, splenomegaly or mass.     Tenderness: There is no abdominal tenderness.  Genitourinary:    Pubic Area: Rash present.     Comments: In the left suprapubic region there are 2 distinct areas of perifollicular swelling with a scant amount of yellow exudate.  There is no formed abscess as and there is no areas of induration, fluctuance, or streaking. Musculoskeletal:        General: Normal range of motion.     Cervical back: Normal range of motion and neck supple.     Right lower leg: No edema.     Left lower leg: No edema.  Lymphadenopathy:     Upper Body:     Right upper body: No supraclavicular, axillary or pectoral adenopathy.     Left upper body: No supraclavicular, axillary or pectoral adenopathy.  Skin:    General: Skin is warm and dry.     Findings: No rash.  Neurological:     General: No focal deficit present.     Mental Status: She is alert and oriented to person, place, and time. Mental status is at baseline.     Lab Results  Component Value Date   WBC 5.9 09/24/2019   HGB 12.5 09/24/2019   HCT 38.4 09/24/2019   PLT 255.0 09/24/2019   GLUCOSE 86 09/24/2019   CHOL 162 09/24/2019   TRIG 78.0 09/24/2019   HDL 44.40 09/24/2019   LDLCALC 102 (H) 09/24/2019   ALT 10 05/11/2019   AST 15 05/11/2019   NA 139 09/24/2019   K 4.1 09/24/2019   CL 106 09/24/2019   CREATININE 0.84 09/24/2019   BUN 14 09/24/2019   CO2 28 09/24/2019   TSH 1.87 09/24/2019   INR 3.1 04/26/2013    No results found.  Assessment & Plan:   Pamela Davenport was seen  today for annual exam, hypertension and chest pain.  Diagnoses and all orders for this visit:  Iron deficiency anemia, unspecified iron deficiency anemia type- Her H&H and iron levels are normal now. -     CBC with Differential/Platelet -     IBC panel -     Ferritin  Other dietary vitamin B12 deficiency anemia- Her B12 level is borderline low.  I recommended that she take high-dose oral B12 supplementation. -     CBC with Differential/Platelet -     Vitamin B12 -  Folate -     cyanocobalamin 2000 MCG tablet; Take 1 tablet (2,000 mcg total) by mouth daily.  Visit for screening mammogram -     MM DIGITAL SCREENING BILATERAL; Future  Routine general medical examination at a health care facility- Exam completed, labs reviewed, vaccines reviewed and updated, colon and cervical cancer screenings are up-to-date, she is referred for screening mammogram, patient education material was given. -     Lipid panel  Essential hypertension- Her blood pressure is not adequately well controlled and she is symptomatic.  I have asked her to add indapamide to her current regimen. -     Basic metabolic panel -     Urinalysis, Routine w reflex microscopic -     TSH -     VITAMIN D 25 Hydroxy (Vit-D Deficiency, Fractures) -     indapamide (LOZOL) 1.25 MG tablet; Take 1 tablet (1.25 mg total) by mouth daily.  Paroxysmal atrial fibrillation ([place])- She has good rate and rhythm control.  Will continue anticoagulation with the DOAC.  Chest pain, pleuritic- She has pleuritic pain chest pain, a normal D-dimer, normal EKG, and normal chest x-ray.  I think her pain is consistent with viral pleurisy.  She tells me she is getting adequate symptom relief with Tylenol. -     DG Chest 2 View; Future -     D-dimer, quantitative (not at [facility]) -     EKG 12-Lead  Hypercoagulable state (HCC)-I have ordered a hypercoagulable panel to try to identify the cause for her frequent thromboembolic events. -     Cancel:  Hypercoagulable panel, comprehensive -     Hypercoagulable panel, comprehensive  Acute folliculitis -     doxycycline (VIBRA-TABS) 100 MG tablet; Take 1 tablet (100 mg total) by mouth 2 (two) times daily for 10 days.  Vitamin D deficiency disease -     Cholecalciferol 1.25 MG (50000 UT) capsule; Take 1 capsule (50,000 Units total) by mouth once a week.   I have discontinued David Stall. [place]'s diazepam. I am also having her start on indapamide, doxycycline, Cholecalciferol, and cyanocobalamin. Additionally, I am having her maintain her flecainide, ketoconazole, losartan, nadolol, rivaroxaban, hydroxychloroquine, chlorhexidine, and nystatin-triamcinolone ointment.  Meds ordered this encounter  Medications  . indapamide (LOZOL) 1.25 MG tablet    Sig: Take 1 tablet (1.25 mg total) by mouth daily.    Dispense:  90 tablet    Refill:  0  . doxycycline (VIBRA-TABS) 100 MG tablet    Sig: Take 1 tablet (100 mg total) by mouth 2 (two) times daily for 10 days.    Dispense:  20 tablet    Refill:  0  . Cholecalciferol 1.25 MG (50000 UT) capsule    Sig: Take 1 capsule (50,000 Units total) by mouth once a week.    Dispense:  12 capsule    Refill:  0  . cyanocobalamin 2000 MCG tablet    Sig: Take 1 tablet (2,000 mcg total) by mouth daily.    Dispense:  90 tablet    Refill:  1     Follow-up: Return in about 3 weeks (around 10/15/2019).  Scarlette Calico, MD

## 2019-09-25 ENCOUNTER — Encounter: Payer: Self-pay | Admitting: Internal Medicine

## 2019-09-25 DIAGNOSIS — E559 Vitamin D deficiency, unspecified: Secondary | ICD-10-CM | POA: Insufficient documentation

## 2019-09-25 LAB — FERRITIN: Ferritin: 23.6 ng/mL (ref 10.0–291.0)

## 2019-09-25 LAB — VITAMIN D 25 HYDROXY (VIT D DEFICIENCY, FRACTURES): VITD: 8.49 ng/mL — ABNORMAL LOW (ref 30.00–100.00)

## 2019-09-25 LAB — FOLATE: Folate: 8.1 ng/mL (ref >5.9)

## 2019-09-25 LAB — D-DIMER, QUANTITATIVE: D-Dimer, Quant: 0.5 mcg/mL FEU — ABNORMAL HIGH (ref <0.50)

## 2019-09-25 LAB — VITAMIN B12: Vitamin B-12: 226 pg/mL (ref 211–911)

## 2019-09-25 MED ORDER — CYANOCOBALAMIN 2000 MCG PO TABS: 2000.0000 ug | ORAL_TABLET | Freq: Every day | ORAL | 1 refills | Status: DC

## 2019-09-25 MED ORDER — CHOLECALCIFEROL 1.25 MG (50000 UT) PO CAPS: 50000.0000 [IU] | ORAL_CAPSULE | ORAL | 0 refills | Status: DC

## 2019-09-25 MED FILL — VITAMIN D3 50,000 UNITS CAP: 1.25 MG | 84 days supply | Qty: 12 | Fill #0

## 2019-10-02 ENCOUNTER — Encounter: Payer: Self-pay | Admitting: Internal Medicine

## 2019-10-03 ENCOUNTER — Encounter: Payer: Self-pay | Admitting: Internal Medicine

## 2019-10-03 LAB — HYPERCOAGULABLE PANEL, COMPREHENSIVE
APTT: 29 s
AT III Act/Nor PPP Chro: 116 %
Act. Prt C Resist w/FV Defic.: 2.6 ratio
Anticardiolipin Ab, IgG: <10 [GPL'U]
Anticardiolipin Ab, IgM: <10 [MPL'U]
Beta-2 Glycoprotein I, IgA: <10 SAU
Beta-2 Glycoprotein I, IgG: <10 SGU
Beta-2 Glycoprotein I, IgM: <10 SMU
DRVVT Confirm Seconds: 51.4 s
DRVVT Ratio: 1.4 ratio — ABNORMAL HIGH
DRVVT Screen Seconds: 81.2 s — ABNORMAL HIGH
Factor VII Antigen**: 142 %
Factor VIII Activity: 181 % — ABNORMAL HIGH
Hexagonal Phospholipid Neutral: 4 s
Homocysteine: 12.7 umol/L
Prot C Ag Act/Nor PPP Imm: 92 %
Prot S Ag Act/Nor PPP Imm: 127 %
Protein C Ag/FVII Ag Ratio**: 0.6 ratio
Protein S Ag/FVII Ag Ratio**: 0.9 ratio

## 2019-10-04 ENCOUNTER — Encounter: Payer: Self-pay | Admitting: Internal Medicine

## 2019-10-04 ENCOUNTER — Other Ambulatory Visit: Payer: Self-pay

## 2019-10-04 ENCOUNTER — Ambulatory Visit (INDEPENDENT_AMBULATORY_CARE_PROVIDER_SITE_OTHER): Payer: 59 | Admitting: Internal Medicine

## 2019-10-04 VITALS — BP 136/74 | HR 71 | Temp 98.3°F | Resp 16 | Ht 69.0 in | Wt 268.5 lb

## 2019-10-04 DIAGNOSIS — I10 Essential (primary) hypertension: Secondary | ICD-10-CM

## 2019-10-04 DIAGNOSIS — K219 Gastro-esophageal reflux disease without esophagitis: Secondary | ICD-10-CM | POA: Diagnosis not present

## 2019-10-04 MED ORDER — ESOMEPRAZOLE MAGNESIUM 40 MG PO CPDR: 40.0000 mg | DELAYED_RELEASE_CAPSULE | Freq: Every day | ORAL | 1 refills | Status: DC

## 2019-10-04 MED FILL — ESOMEPRAZOLE MAG DR 40 MG C: 40 | 90 days supply | Qty: 90 | Fill #0

## 2019-10-04 NOTE — Progress Notes (Signed)
Subjective:  Patient ID: Diamantina Providence, female    DOB: 11-03-1955  Age: 64 y.o. MRN: [id number]  CC: Hypertension and Gastroesophageal Reflux  This visit occurred during the SARS-CoV-2 public health emergency.  Safety protocols were in place, including screening questions prior to the visit, additional usage of staff PPE, and extensive cleaning of exam room while observing appropriate contact time as indicated for disinfecting solutions.    HPI Pamela Davenport presents for f/up - She comes in for a BP check - she felt dizzy a few days ago and someone check her BP and told her that her SBP was down to 110. She tells me that the dizzy spells have resolved. She complains of heart burn.  Outpatient Medications Prior to Visit  Medication Sig Dispense Refill  . chlorhexidine (PERIDEX) 0.12 % solution     . Cholecalciferol 1.25 MG (50000 UT) capsule Take 1 capsule (50,000 Units total) by mouth once a week. 12 capsule 0  . cyanocobalamin 2000 MCG tablet Take 1 tablet (2,000 mcg total) by mouth daily. 90 tablet 1  . flecainide (TAMBOCOR) 150 MG tablet Take 2 tablets by mouth at the onset of AFIB 30 tablet 3  . hydroxychloroquine (PLAQUENIL) 200 MG tablet TAKE 1 TABLET (200 MG TOTAL) BY MOUTH 2 (TWO) TIMES DAILY. 180 tablet 0  . indapamide (LOZOL) 1.25 MG tablet Take 1 tablet (1.25 mg total) by mouth daily. 90 tablet 0  . ketoconazole (NIZORAL) 2 % cream as needed.    Marland Kitchen losartan (COZAAR) 25 MG tablet Take 1 tablet (25 mg total) by mouth daily. 90 tablet 3  . nadolol (CORGARD) 20 MG tablet Take 1 tablet (20 mg total) by mouth daily. 90 tablet 3  . nystatin-triamcinolone ointment (MYCOLOG)     . rivaroxaban (XARELTO) 20 MG TABS tablet TAKE 1 TABLET BY MOUTH DAILY WITH SUPPER. 90 tablet 3  . doxycycline (VIBRA-TABS) 100 MG tablet Take 1 tablet (100 mg total) by mouth 2 (two) times daily for 10 days. 20 tablet 0   No facility-administered medications prior to visit.    ROS Review of Systems    Constitutional: Negative for diaphoresis, fatigue and unexpected weight change.  HENT: Negative.  Negative for sore throat, trouble swallowing and voice change.   Eyes: Negative.   Respiratory: Negative for cough, chest tightness, shortness of breath and wheezing.   Cardiovascular: Negative for chest pain, palpitations and leg swelling.  Gastrointestinal: Negative for abdominal pain, constipation, diarrhea, nausea and vomiting.  Endocrine: Negative.   Genitourinary: Negative.  Negative for difficulty urinating.  Musculoskeletal: Negative.  Negative for arthralgias.  Skin: Negative.  Negative for color change.  Neurological: Negative.  Negative for dizziness, weakness, light-headedness and headaches.  Hematological: Negative for adenopathy. Does not bruise/bleed easily.  Psychiatric/Behavioral: Negative.     Objective:  BP 136/74 (BP Location: Left Arm, Patient Position: Sitting, Cuff Size: Large)   Pulse 71   Temp 98.3 F (36.8 C) (Oral)   Resp 16   Ht 5\' 9"  (1.753 m)   Wt 268 lb 8 oz (121.8 kg)   SpO2 99%   BMI 39.65 kg/m   BP Readings from Last 3 Encounters:  10/04/19 136/74  09/24/19 (!) 160/90  06/18/19 132/86    Wt Readings from Last 3 Encounters:  10/04/19 268 lb 8 oz (121.8 kg)  09/24/19 270 lb (122.5 kg)  06/18/19 270 lb 6.4 oz (122.7 kg)    Physical Exam Vitals reviewed.  Constitutional:      Appearance:  Normal appearance.  HENT:     Nose: Nose normal.  Eyes:     General: No scleral icterus.    Conjunctiva/sclera: Conjunctivae normal.  Cardiovascular:     Rate and Rhythm: Normal rate and regular rhythm.     Heart sounds: No murmur.  Pulmonary:     Effort: Pulmonary effort is normal.     Breath sounds: No stridor. No wheezing, rhonchi or rales.  Abdominal:     General: Abdomen is flat. Bowel sounds are normal. There is no distension.     Palpations: Abdomen is soft. There is no hepatomegaly, splenomegaly or mass.     Tenderness: There is no abdominal  tenderness.  Musculoskeletal:        General: Normal range of motion.     Cervical back: Neck supple.     Right lower leg: No edema.     Left lower leg: No edema.  Lymphadenopathy:     Cervical: No cervical adenopathy.  Skin:    General: Skin is warm and dry.     Coloration: Skin is not pale.  Neurological:     General: No focal deficit present.     Mental Status: She is alert.     Lab Results  Component Value Date   WBC 5.9 09/24/2019   HGB 12.5 09/24/2019   HCT 38.4 09/24/2019   PLT 255.0 09/24/2019   GLUCOSE 86 09/24/2019   CHOL 162 09/24/2019   TRIG 78.0 09/24/2019   HDL 44.40 09/24/2019   LDLCALC 102 (H) 09/24/2019   ALT 10 05/11/2019   AST 15 05/11/2019   NA 139 09/24/2019   K 4.1 09/24/2019   CL 106 09/24/2019   CREATININE 0.84 09/24/2019   BUN 14 09/24/2019   CO2 28 09/24/2019   TSH 1.87 09/24/2019   INR 3.1 04/26/2013    No results found.  Assessment & Plan:   Ali was seen today for hypertension and gastroesophageal reflux.  Diagnoses and all orders for this visit:  GERD without esophagitis- I recommended that she treat this with a PPI. -     esomeprazole (NEXIUM) 40 MG capsule; Take 1 capsule (40 mg total) by mouth daily at 12 noon.   Essential hypertension- Her BP is well controlled but not over-controlled. I think the recent SBP of 110 was an error. Will continue the current antihypertensives.   I have discontinued David Stall. Carra's doxycycline. I am also having her start on esomeprazole. Additionally, I am having her maintain her flecainide, ketoconazole, losartan, nadolol, rivaroxaban, hydroxychloroquine, chlorhexidine, nystatin-triamcinolone ointment, indapamide, Cholecalciferol, and cyanocobalamin.  Meds ordered this encounter  Medications  . esomeprazole (NEXIUM) 40 MG capsule    Sig: Take 1 capsule (40 mg total) by mouth daily at 12 noon.    Dispense:  90 capsule    Refill:  1     Follow-up: Return in about 6 months (around  04/02/2020).  Scarlette Calico, MD

## 2019-10-04 NOTE — Patient Instructions (Signed)
Gastroesophageal Reflux Disease, Adult Gastroesophageal reflux (GER) happens when acid from the stomach flows up into the tube that connects the mouth and the stomach (esophagus). Normally, food travels down the esophagus and stays in the stomach to be digested. However, when a person has GER, food and stomach acid sometimes move back up into the esophagus. If this becomes a more serious problem, the person may be diagnosed with a disease called gastroesophageal reflux disease (GERD). GERD occurs when the reflux:  Happens often.  Causes frequent or severe symptoms.  Causes problems such as damage to the esophagus. When stomach acid comes in contact with the esophagus, the acid may cause soreness (inflammation) in the esophagus. Over time, GERD may create small holes (ulcers) in the lining of the esophagus. What are the causes? This condition is caused by a problem with the muscle between the esophagus and the stomach (lower esophageal sphincter, or LES). Normally, the LES muscle closes after food passes through the esophagus to the stomach. When the LES is weakened or abnormal, it does not close properly, and that allows food and stomach acid to go back up into the esophagus. The LES can be weakened by certain dietary substances, medicines, and medical conditions, including:  Tobacco use.  Pregnancy.  Having a hiatal hernia.  Alcohol use.  Certain foods and beverages, such as coffee, chocolate, onions, and peppermint. What increases the risk? You are more likely to develop this condition if you:  Have an increased body weight.  Have a connective tissue disorder.  Use NSAID medicines. What are the signs or symptoms? Symptoms of this condition include:  Heartburn.  Difficult or painful swallowing.  The feeling of having a lump in the throat.  Abitter taste in the mouth.  Bad breath.  Having a large amount of saliva.  Having an upset or bloated  stomach.  Belching.  Chest pain. Different conditions can cause chest pain. Make sure you see your health care provider if you experience chest pain.  Shortness of breath or wheezing.  Ongoing (chronic) cough or a night-time cough.  Wearing away of tooth enamel.  Weight loss. How is this diagnosed? Your health care provider will take a medical history and perform a physical exam. To determine if you have mild or severe GERD, your health care provider may also monitor how you respond to treatment. You may also have tests, including:  A test to examine your stomach and esophagus with a small camera (endoscopy).  A test thatmeasures the acidity level in your esophagus.  A test thatmeasures how much pressure is on your esophagus.  A barium swallow or modified barium swallow test to show the shape, size, and functioning of your esophagus. How is this treated? The goal of treatment is to help relieve your symptoms and to prevent complications. Treatment for this condition may vary depending on how severe your symptoms are. Your health care provider may recommend:  Changes to your diet.  Medicine.  Surgery. Follow these instructions at home: Eating and drinking   Follow a diet as recommended by your health care provider. This may involve avoiding foods and drinks such as: ? Coffee and tea (with or without caffeine). ? Drinks that containalcohol. ? Energy drinks and sports drinks. ? Carbonated drinks or sodas. ? Chocolate and cocoa. ? Peppermint and mint flavorings. ? Garlic and onions. ? Horseradish. ? Spicy and acidic foods, including peppers, chili powder, curry powder, vinegar, hot sauces, and barbecue sauce. ? Citrus fruit juices and citrus   fruits, such as oranges, lemons, and limes. ? Tomato-based foods, such as red sauce, chili, salsa, and pizza with red sauce. ? Fried and fatty foods, such as donuts, french fries, potato chips, and high-fat dressings. ? High-fat  meats, such as hot dogs and fatty cuts of red and white meats, such as rib eye steak, sausage, ham, and bacon. ? High-fat dairy items, such as whole milk, butter, and cream cheese.  Eat small, frequent meals instead of large meals.  Avoid drinking large amounts of liquid with your meals.  Avoid eating meals during the 2-3 hours before bedtime.  Avoid lying down right after you eat.  Do not exercise right after you eat. Lifestyle   Do not use any products that contain nicotine or tobacco, such as cigarettes, e-cigarettes, and chewing tobacco. If you need help quitting, ask your health care provider.  Try to reduce your stress by using methods such as yoga or meditation. If you need help reducing stress, ask your health care provider.  If you are overweight, reduce your weight to an amount that is healthy for you. Ask your health care provider for guidance about a safe weight loss goal. General instructions  Pay attention to any changes in your symptoms.  Take over-the-counter and prescription medicines only as told by your health care provider. Do not take aspirin, ibuprofen, or other NSAIDs unless your health care provider told you to do so.  Wear loose-fitting clothing. Do not wear anything tight around your waist that causes pressure on your abdomen.  Raise (elevate) the head of your bed about 6 inches (15 cm).  Avoid bending over if this makes your symptoms worse.  Keep all follow-up visits as told by your health care provider. This is important. Contact a health care provider if:  You have: ? New symptoms. ? Unexplained weight loss. ? Difficulty swallowing or it hurts to swallow. ? Wheezing or a persistent cough. ? A hoarse voice.  Your symptoms do not improve with treatment. Get help right away if you:  Have pain in your arms, neck, jaw, teeth, or back.  Feel sweaty, dizzy, or light-headed.  Have chest pain or shortness of breath.  Vomit and your vomit looks  like blood or coffee grounds.  Faint.  Have stool that is bloody or black.  Cannot swallow, drink, or eat. Summary  Gastroesophageal reflux happens when acid from the stomach flows up into the esophagus. GERD is a disease in which the reflux happens often, causes frequent or severe symptoms, or causes problems such as damage to the esophagus.  Treatment for this condition may vary depending on how severe your symptoms are. Your health care provider may recommend diet and lifestyle changes, medicine, or surgery.  Contact a health care provider if you have new or worsening symptoms.  Take over-the-counter and prescription medicines only as told by your health care provider. Do not take aspirin, ibuprofen, or other NSAIDs unless your health care provider told you to do so.  Keep all follow-up visits as told by your health care provider. This is important. This information is not intended to replace advice given to you by your health care provider. Make sure you discuss any questions you have with your health care provider. Document Revised: 02/22/2018 Document Reviewed: 02/22/2018 [employer] Patient Education  2020 [employer]  

## 2019-10-09 ENCOUNTER — Ambulatory Visit: Payer: 59 | Admitting: Internal Medicine

## 2019-10-12 MED FILL — diazePAM 5 MG TABS: 5 | 30 days supply | Qty: 30 | Fill #2

## 2019-10-15 ENCOUNTER — Ambulatory Visit: Payer: 59 | Admitting: Internal Medicine

## 2019-10-30 MED FILL — LOSARTAN POTASSIUM 25 MG TA: 25 | 90 days supply | Qty: 90 | Fill #2

## 2019-12-19 ENCOUNTER — Other Ambulatory Visit: Payer: Self-pay | Admitting: Rheumatology

## 2019-12-19 DIAGNOSIS — M0579 Rheumatoid arthritis with rheumatoid factor of multiple sites without organ or systems involvement: Secondary | ICD-10-CM

## 2019-12-19 MED FILL — XARELTO 20 MG TABLET: 20 | 90 days supply | Qty: 90 | Fill #2

## 2019-12-19 MED FILL — HYDROXYCHLOROQUINE 200 MG T: 200 | 90 days supply | Qty: 180 | Fill #0

## 2019-12-19 MED FILL — NADOLOL 20 MG TABS: 20 | 90 days supply | Qty: 90 | Fill #2

## 2019-12-19 NOTE — Telephone Encounter (Signed)
Last Visit: 08/14/2019 telemedicine  Next Visit: 01/21/2020 Labs: 09/24/2019 CBC, BMP WNL  Eye exam: 01/25/2019   Okay to refill per Dr. Estanislado Pandy.

## 2020-01-04 ENCOUNTER — Other Ambulatory Visit: Payer: Self-pay | Admitting: Internal Medicine

## 2020-01-04 DIAGNOSIS — I10 Essential (primary) hypertension: Secondary | ICD-10-CM

## 2020-01-04 MED FILL — INDAPAMIDE 1.25 MG TABLET: 1.25 | 90 days supply | Qty: 90 | Fill #0

## 2020-01-09 NOTE — Progress Notes (Signed)
Office Visit Note  Patient: Pamela Davenport             Date of Birth: [birth year]           MRN: [id number]             PCP: Janith Lima, MD Referring: Janith Lima, MD Visit Date: 01/21/2020 Occupation: @GUAROCC @  Subjective:  Arthralgias   History of Present Illness: Pamela Davenport is a 64 y.o. female with history of seropositive rheumatoid arthritis and osteoarthritis.  She is taking plaquenil 200 mg 1 tablet by mouth twice daily, which he started in May 2020.  She states for the past 2 days she has been having left shoulder joint pain, which is worse with certain activities.  She continues to have migratory arthralgias and morning stiffness for 1 hour.  She has intermittent pain in both CMC joints.  She denies any joint swelling at this time.   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   Activities of Daily Living:  Patient reports morning stiffness for 1 hour.   Patient Reports nocturnal pain.  Difficulty dressing/grooming: Denies Difficulty climbing stairs: Reports Difficulty getting out of chair: Reports Difficulty using hands for taps, buttons, cutlery, and/or writing: Reports  Review of Systems  Constitutional: Positive for fatigue.  HENT: Negative for mouth sores, mouth dryness and nose dryness.   Eyes: Negative for pain, itching, visual disturbance and dryness.  Respiratory: Negative for cough, hemoptysis, shortness of breath and difficulty breathing.   Cardiovascular: Negative for chest pain, palpitations, hypertension and swelling in legs/feet.  Gastrointestinal: Negative for blood in stool, constipation and diarrhea.  Endocrine: Negative for increased urination.  Genitourinary: Negative for difficulty urinating and painful urination.  Musculoskeletal: Positive for arthralgias, joint pain and morning stiffness. Negative for joint swelling, myalgias, muscle weakness, muscle tenderness and myalgias.  Skin: Negative for color change, pallor, rash, hair loss,  nodules/bumps, redness, skin tightness, ulcers and sensitivity to sunlight.  Allergic/Immunologic: Negative for susceptible to infections.  Neurological: Negative for numbness, memory loss and weakness.  Hematological: Negative for bruising/bleeding tendency and swollen glands.  Psychiatric/Behavioral: Negative for depressed mood, confusion and sleep disturbance. The patient is not nervous/anxious.     PMFS History:  Patient Active Problem List   Diagnosis Date Noted  . GERD without esophagitis 10/04/2019  . Vitamin D deficiency disease 09/25/2019  . Hypercoagulable state ([place]) 09/24/2019  . Iron deficiency anemia 12/07/2018  . Other dietary vitamin B12 deficiency anemia 12/07/2018  . Fibroid uterus 08/21/2018  . De Quervain's tenosynovitis 03/01/2018  . Visit for screening mammogram 04/18/2017  . Cervical cancer screening 04/18/2017  . Hyperlipidemia with target LDL less than 130 10/19/2016  . Routine general medical examination at a health care facility 02/03/2015  . Essential hypertension 01/13/2015  . Chronic pulmonary embolism ([place]) 12/05/2008  . ATRIAL FIBRILLATION 12/05/2008  . PVC's (premature ventricular contractions) 12/05/2008  . DVT 12/05/2008    Past Medical History:  Diagnosis Date  . Atypical chest pain    normal myoview 11/11/10  . Blood transfusion without reported diagnosis   . DVT (deep venous thrombosis) (HCC)    recurrent; chronically anticoagulated with coumadin  . Family history of anesthesia complication    Sister had decrease in respirations after surgery  . Heart murmur   . Herniated lumbar intervertebral disc   . Hypertension   . Paroxysmal atrial fibrillation (HCC)    SINCE AGE 37   . PE (pulmonary embolism)  chronically anticoagulated with coumadin  . Pneumonia 07/20/12  . PONV (postoperative nausea and vomiting)   . Sickle cell trait ([place])   . Sinus congestion 08/14/2018   C/O NASAL/CHEST  CONGESTION, COUGHING WITH PHLEGM, PER PATIENT ,  SHE IS TO BEGIN TAKING AUGMENTIN DOSE PACK TODAY;    . Strep throat at age 48   The patient reports a severe strep throat infection at age 89 and   is not clear as to whether or not she may have had rheumatic fever.  . Symptomatic premature ventricular contractions    improved s/p ablation  . Urgency of urination   . Uterine bleeding 2008   Uterine artery embolization in 2008 for uterine bleeding.    Family History  Problem Relation Age of Onset  . Hypertension Mother   . Diabetes Mother   . Uterine cancer Sister   . Prostate cancer Brother   . Prostate cancer Paternal Grandmother   . Colon cancer Paternal Grandfather   . Prostate cancer Brother   . Prostate cancer Brother   . Healthy Son    Past Surgical History:  Procedure Laterality Date  . COLONOSCOPY W/ POLYPECTOMY    . Diagnostic D&C hysteroscopy and Novasure ablation  03/17/2004  . DILATION AND CURETTAGE OF UTERUS  12/17/2011   Procedure: DILATATION AND CURETTAGE;  Surgeon: Frederico Hamman, MD;  Location: [facility];  Service: Gynecology;  Laterality: N/A;  With Attempted Hydrothermal Ablation  . LUMBAR LAMINECTOMY/DECOMPRESSION MICRODISCECTOMY Left 02/21/2013   Procedure: LUMBAR LAMINECTOMY/DECOMPRESSION MICRODISCECTOMY 1 LEVEL;  Surgeon: Winfield Cunas, MD;  Location: [facility];  Service: Neurosurgery;  Laterality: Left;  LEFT Lumbar Three-Four Laminotomy foraminotomy microdiskectomy  . MANDIBLE SURGERY     under bite  . PVC Ablation  2010  . ROBOTIC ASSISTED TOTAL HYSTERECTOMY WITH BILATERAL SALPINGO OOPHERECTOMY Bilateral 08/21/2018   Procedure: XI ROBOTIC ASSISTED TOTAL HYSTERECTOMY WITH BILATERAL SALPINGO OOPHORECTOMY;  Surgeon: Everitt Amber, MD;  Location: [facility];  Service: Gynecology;  Laterality: Bilateral;  . SPINE SURGERY    . TONSILLECTOMY    . UTERINE ARTERY EMBOLIZATION  2008   for uterine bleeding  . 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 Influenza,inj,Quad PF,6+ Mos 06/14/2019  . Tdap 02/03/2015     Objective: Vital Signs: BP (!) 144/78 (BP Location: Left Arm, Patient Position: Sitting, Cuff Size: Normal)   Pulse 72   Resp 15   Ht 5\' 9"  (1.753 m)   Wt 280 lb (127 kg)   BMI 41.35 kg/m    Physical Exam Vitals and nursing note reviewed.  Constitutional:      Appearance: She is well-developed.  HENT:     Head: Normocephalic and atraumatic.  Eyes:     Conjunctiva/sclera: Conjunctivae normal.  Pulmonary:     Effort: Pulmonary effort is normal.  Abdominal:     General: Bowel sounds are normal.     Palpations: Abdomen is soft.  Musculoskeletal:     Cervical back: Normal range of motion.  Lymphadenopathy:     Cervical: No cervical adenopathy.  Skin:    General: Skin is warm and dry.     Capillary Refill: Capillary refill takes less than 2 seconds.  Neurological:     Mental Status: She is alert and oriented to person, place, and time.  Psychiatric:  Behavior: Behavior normal.      Musculoskeletal Exam: C-spine, thoracic spine, and lumbar spine good ROM.  Shoulder joints, elbow joints, wrist joints, MCPs, PIPs, and DIPs good ROM with no synovitis.  Complete fist formation bilaterally.  Hip joints, knee joints, ankle joints, MTPs, PIPs, and DIPs good ROM with no synovitis.  No warmth or effusion of knee joints.  No tenderness or swelling of ankle joints.   CDAI Exam: CDAI Score: 0.6  Patient Global: 3 mm; Provider Global: 3 mm Swollen: 0 ; Tender: 0  Joint Exam 01/21/2020   No joint exam has been documented for this visit   There is currently no information documented on the homunculus. Go to the Rheumatology activity and complete the homunculus joint exam.  Investigation: No additional findings.  Imaging: No results found.  Recent Labs: Lab Results  Component Value Date   WBC 5.9 09/24/2019    HGB 12.5 09/24/2019   PLT 255.0 09/24/2019   NA 139 09/24/2019   K 4.1 09/24/2019   CL 106 09/24/2019   CO2 28 09/24/2019   GLUCOSE 86 09/24/2019   BUN 14 09/24/2019   CREATININE 0.84 09/24/2019   BILITOT 0.3 05/11/2019   ALKPHOS 80 08/14/2018   AST 15 05/11/2019   ALT 10 05/11/2019   PROT 6.7 05/11/2019   ALBUMIN 4.3 08/14/2018   CALCIUM 9.3 09/24/2019   GFRAA 78 05/11/2019   QFTBGOLDPLUS NEGATIVE 01/15/2019    Speciality Comments: PLQ Eye Exam 01/25/19 WNL @ [facility]  Follow up in 6 months   Procedures:  No procedures performed Allergies: Methylprednisolone, Sulfa antibiotics, and Sulfonamide derivatives   Assessment / Plan:     Visit Diagnoses: Rheumatoid arthritis with rheumatoid factor of multiple sites without organ or systems involvement (HCC) - RF 74, CCP >250 ,Sed rate 45, ANA 1: 320 cytoplasmic, positive synovitis - She has no tenderness or synovitis on examination today.  She has not had any recent rheumatoid arthritis flares. She has been experiencing left shoulder discomfort for the past 2 days but has good ROM and no obvious inflammation at this time. She experiences migratory arthralgias likely due to underlying osteoarthritis and overuse activities. She has not had any joint swelling recently. She is clinically doing well on Plaquenil 200 mg 1 tablet by mouth twice daily.  She will continue taking plaquenil at this time.  She was advised to notify us if she develops increased joint pain or joint swelling. She will follow up in 5 months. Plan: Sedimentation rate  High risk medication use - PLQ 200 mg BID-Started in May 2020. Last Plaquenil eye exam normal on 01/25/2019.  She was given a PLQ eye exam to take with her to her upcoming appointment.  CBC and CMP are due today.  Orders were released. - Plan: CBC with Differential/Platelet, COMPLETE METABOLIC PANEL WITH GFR  Primary osteoarthritis of both hands:  She has no tenderness or inflammation.  She has  complete fist formation bilaterally.  She has intermittent CMC joint pain bilaterally.  Joint protection and muscle strengthening were discussed.   Primary osteoarthritis of both knees: She has good ROM of both knee joints. No warmth or effusion noted.  Primary osteoarthritis of both feet: She is not having any discomfort in her feet at this time.  She wears proper fitting shoes.  Positive ANA (antinuclear antibody) - ENA, C3-C4, anticardiolipin, lupus anticoagulant, beta-2 GP 1-were all negative: She has no clinical features of autoimmune disease at this time.   Other medical conditions  are listed as follows:   History of pulmonary embolism  History of DVT (deep vein thrombosis)  PVC's (premature ventricular contractions)  History of atrial fibrillation  Hyperlipidemia with target LDL less than 130  History of anemia  Essential hypertension  Orders: Orders Placed This Encounter  Procedures  . CBC with Differential/Platelet  . COMPLETE METABOLIC PANEL WITH GFR  . Sedimentation rate   No orders of the defined types were placed in this encounter.    Follow-Up Instructions: Return in about 3 months (around 04/22/2020) for Rheumatoid arthritis, Osteoarthritis.   Hazel Sams, PA-C  I examined and evaluated the patient with Hazel Sams PA.  Patient had no synovitis on examination.  She will continue Plaquenil as prescribed.  The plan of care was discussed as noted above.  Bo Merino, MD  Note - This record has been created using Editor, commissioning.  Chart creation errors have been sought, but may not always  have been located. Such creation errors do not reflect on  the standard of medical care.

## 2020-01-21 ENCOUNTER — Encounter: Payer: Self-pay | Admitting: Physician Assistant

## 2020-01-21 ENCOUNTER — Ambulatory Visit: Payer: 59 | Admitting: Rheumatology

## 2020-01-21 ENCOUNTER — Other Ambulatory Visit: Payer: Self-pay

## 2020-01-21 VITALS — BP 144/78 | HR 72 | Resp 15 | Ht 69.0 in | Wt 280.0 lb

## 2020-01-21 DIAGNOSIS — Z86718 Personal history of other venous thrombosis and embolism: Secondary | ICD-10-CM | POA: Diagnosis not present

## 2020-01-21 DIAGNOSIS — I493 Ventricular premature depolarization: Secondary | ICD-10-CM

## 2020-01-21 DIAGNOSIS — M19072 Primary osteoarthritis, left ankle and foot: Secondary | ICD-10-CM

## 2020-01-21 DIAGNOSIS — Z79899 Other long term (current) drug therapy: Secondary | ICD-10-CM

## 2020-01-21 DIAGNOSIS — M170 Bilateral primary osteoarthritis of knee: Secondary | ICD-10-CM

## 2020-01-21 DIAGNOSIS — M19041 Primary osteoarthritis, right hand: Secondary | ICD-10-CM | POA: Diagnosis not present

## 2020-01-21 DIAGNOSIS — M0579 Rheumatoid arthritis with rheumatoid factor of multiple sites without organ or systems involvement: Secondary | ICD-10-CM

## 2020-01-21 DIAGNOSIS — I10 Essential (primary) hypertension: Secondary | ICD-10-CM

## 2020-01-21 DIAGNOSIS — Z8679 Personal history of other diseases of the circulatory system: Secondary | ICD-10-CM

## 2020-01-21 DIAGNOSIS — M19071 Primary osteoarthritis, right ankle and foot: Secondary | ICD-10-CM | POA: Diagnosis not present

## 2020-01-21 DIAGNOSIS — Z86711 Personal history of pulmonary embolism: Secondary | ICD-10-CM

## 2020-01-21 DIAGNOSIS — M19042 Primary osteoarthritis, left hand: Secondary | ICD-10-CM

## 2020-01-21 DIAGNOSIS — Z862 Personal history of diseases of the blood and blood-forming organs and certain disorders involving the immune mechanism: Secondary | ICD-10-CM

## 2020-01-21 DIAGNOSIS — R768 Other specified abnormal immunological findings in serum: Secondary | ICD-10-CM | POA: Diagnosis not present

## 2020-01-21 DIAGNOSIS — E785 Hyperlipidemia, unspecified: Secondary | ICD-10-CM

## 2020-01-21 LAB — CBC WITH DIFFERENTIAL/PLATELET
Absolute Monocytes: 518 cells/uL (ref 200–950)
Basophils Absolute: 28 cells/uL (ref 0–200)
Basophils Relative: 0.4 %
Eosinophils Absolute: 138 cells/uL (ref 15–500)
Eosinophils Relative: 2 %
HCT: 37 % (ref 35.0–45.0)
Hemoglobin: 11.9 g/dL (ref 11.7–15.5)
Lymphs Abs: 1401 cells/uL (ref 850–3900)
MCH: 26.9 pg — ABNORMAL LOW (ref 27.0–33.0)
MCHC: 32.2 g/dL (ref 32.0–36.0)
MCV: 83.5 fL (ref 80.0–100.0)
MPV: 10.1 fL (ref 7.5–12.5)
Monocytes Relative: 7.5 %
Neutro Abs: 4816 cells/uL (ref 1500–7800)
Neutrophils Relative %: 69.8 %
Platelets: 252 10*3/uL (ref 140–400)
RBC: 4.43 10*6/uL (ref 3.80–5.10)
RDW: 13.1 % (ref 11.0–15.0)
Total Lymphocyte: 20.3 %
WBC: 6.9 10*3/uL (ref 3.8–10.8)

## 2020-01-21 LAB — COMPLETE METABOLIC PANEL WITH GFR
AG Ratio: 1.4 (calc) (ref 1.0–2.5)
ALT: 12 U/L (ref 6–29)
AST: 16 U/L (ref 10–35)
Albumin: 4.3 g/dL (ref 3.6–5.1)
Alkaline phosphatase (APISO): 89 U/L (ref 37–153)
BUN/Creatinine Ratio: 21 (calc) (ref 6–22)
BUN: 24 mg/dL (ref 7–25)
CO2: 27 mmol/L (ref 20–32)
Calcium: 9.8 mg/dL (ref 8.6–10.4)
Chloride: 98 mmol/L (ref 98–110)
Creat: 1.13 mg/dL — ABNORMAL HIGH (ref 0.50–0.99)
GFR, Est African American: 60 mL/min/{1.73_m2} (ref >=60)
GFR, Est Non African American: 52 mL/min/{1.73_m2} — ABNORMAL LOW (ref >=60)
Globulin: 3 g/dL (calc) (ref 1.9–3.7)
Glucose, Bld: 97 mg/dL (ref 65–99)
Potassium: 4.2 mmol/L (ref 3.5–5.3)
Sodium: 136 mmol/L (ref 135–146)
Total Bilirubin: 0.3 mg/dL (ref 0.2–1.2)
Total Protein: 7.3 g/dL (ref 6.1–8.1)

## 2020-01-21 LAB — SEDIMENTATION RATE: Sed Rate: 25 mm/h (ref 0–30)

## 2020-01-22 NOTE — Progress Notes (Signed)
CBC WNL.  Creatinine is elevated and GFR WNL.  She is prescribed diuretics.  Please advise the patient to avoid taking NSAIDs. Rest of CMP WNL.

## 2020-01-24 MED FILL — LOSARTAN POTASSIUM 25 MG TA: 25 | 90 days supply | Qty: 90 | Fill #3

## 2020-03-18 MED FILL — XARELTO 20 MG TABLET: 20 | 90 days supply | Qty: 90 | Fill #3

## 2020-03-18 MED FILL — NADOLOL 20 MG TABS: 20 | 90 days supply | Qty: 90 | Fill #3

## 2020-03-24 NOTE — Progress Notes (Signed)
Office Visit Note  Patient: Pamela Davenport             Date of Birth: 07-Dec-1955           MRN: [id number]             PCP: Janith Lima, MD Referring: Janith Lima, MD Visit Date: 04/07/2020 Occupation: @GUAROCC @  Subjective:  Medication monitoring   History of Present Illness: Pamela Davenport is a 64 y.o. female with history of seropositive rheumatoid arthritis and osteoarthritis.  She is taking plaquenil 200 mg 1 tablet by mouth twice daily.  She is tolerating Plaquenil without any side effects.  She has received both COVID-19 vaccinations.  She denies any recent rheumatoid arthritis flares.  She denies any joint pain or inflammation at this time.  She does experience intermittent joint stiffness especially in her knee joints.  She continues to have chronic lower back pain but denies any symptoms of radiculopathy. She reports that she has had several bouts of folliculitis since starting on Plaquenil.  She states that she was evaluated by Dr. Ronnald Ramp in February 2021 and was prescribed doxycycline which resolved the episode.     Activities of Daily Living:  Patient reports morning stiffness for a couple of hours.   Patient Reports nocturnal pain.  Difficulty dressing/grooming: Denies Difficulty climbing stairs: Denies Difficulty getting out of chair: Denies Difficulty using hands for taps, buttons, cutlery, and/or writing: Denies  Review of Systems  Constitutional: Positive for fatigue.  HENT: Positive for mouth dryness. Negative for mouth sores and nose dryness.   Eyes: Positive for pain. Negative for itching and dryness.  Respiratory: Negative for shortness of breath and difficulty breathing.   Cardiovascular: Negative for chest pain and palpitations.  Gastrointestinal: Negative for blood in stool, constipation and diarrhea.  Endocrine: Negative for increased urination.  Genitourinary: Negative for difficulty urinating.  Musculoskeletal: Positive for morning stiffness.  Negative for arthralgias, joint pain, joint swelling, myalgias, muscle tenderness and myalgias.  Skin: Negative for color change, rash and redness.  Allergic/Immunologic: Negative for susceptible to infections.  Neurological: Negative for dizziness, numbness, headaches, memory loss and weakness.  Hematological: Negative for bruising/bleeding tendency.  Psychiatric/Behavioral: Positive for sleep disturbance. Negative for confusion.    PMFS History:  Patient Active Problem List   Diagnosis Date Noted  . GERD without esophagitis 10/04/2019  . Vitamin D deficiency disease 09/25/2019  . Hypercoagulable state ([place]) 09/24/2019  . Iron deficiency anemia 12/07/2018  . Other dietary vitamin B12 deficiency anemia 12/07/2018  . Fibroid uterus 08/21/2018  . De Quervain's tenosynovitis 03/01/2018  . Visit for screening mammogram 04/18/2017  . Cervical cancer screening 04/18/2017  . Hyperlipidemia with target LDL less than 130 10/19/2016  . Routine general medical examination at a health care facility 02/03/2015  . Essential hypertension 01/13/2015  . Chronic pulmonary embolism ([place]) 12/05/2008  . ATRIAL FIBRILLATION 12/05/2008  . PVC's (premature ventricular contractions) 12/05/2008  . DVT 12/05/2008    Past Medical History:  Diagnosis Date  . Atypical chest pain    normal myoview 11/11/10  . Blood transfusion without reported diagnosis   . DVT (deep venous thrombosis) (HCC)    recurrent; chronically anticoagulated with coumadin  . Family history of anesthesia complication    Sister had decrease in respirations after surgery  . Heart murmur   . Herniated lumbar intervertebral disc   . Hypertension   . Paroxysmal atrial fibrillation (HCC)    SINCE AGE 37   . PE (pulmonary  embolism)    chronically anticoagulated with coumadin  . Pneumonia 07/20/12  . PONV (postoperative nausea and vomiting)   . Sickle cell trait ([place])   . Sinus congestion 08/14/2018   C/O NASAL/CHEST  CONGESTION,  COUGHING WITH PHLEGM, PER PATIENT , SHE IS TO BEGIN TAKING AUGMENTIN DOSE PACK TODAY;    . Strep throat at age 51   The patient reports a severe strep throat infection at age 68 and   is not clear as to whether or not she may have had rheumatic fever.  . Symptomatic premature ventricular contractions    improved s/p ablation  . Urgency of urination   . Uterine bleeding 2008   Uterine artery embolization in 2008 for uterine bleeding.    Family History  Problem Relation Age of Onset  . Hypertension Mother   . Diabetes Mother   . Uterine cancer Sister   . Prostate cancer Brother   . Prostate cancer Paternal Grandmother   . Colon cancer Paternal Grandfather   . Prostate cancer Brother   . Prostate cancer Brother   . Healthy Son    Past Surgical History:  Procedure Laterality Date  . COLONOSCOPY W/ POLYPECTOMY    . Diagnostic D&C hysteroscopy and Novasure ablation  03/17/2004  . DILATION AND CURETTAGE OF UTERUS  12/17/2011   Procedure: DILATATION AND CURETTAGE;  Surgeon: Frederico Hamman, MD;  Location: [facility];  Service: Gynecology;  Laterality: N/A;  With Attempted Hydrothermal Ablation  . LUMBAR LAMINECTOMY/DECOMPRESSION MICRODISCECTOMY Left 02/21/2013   Procedure: LUMBAR LAMINECTOMY/DECOMPRESSION MICRODISCECTOMY 1 LEVEL;  Surgeon: Winfield Cunas, MD;  Location: [facility];  Service: Neurosurgery;  Laterality: Left;  LEFT Lumbar Three-Four Laminotomy foraminotomy microdiskectomy  . MANDIBLE SURGERY     under bite  . PVC Ablation  2010  . ROBOTIC ASSISTED TOTAL HYSTERECTOMY WITH BILATERAL SALPINGO OOPHERECTOMY Bilateral 08/21/2018   Procedure: XI ROBOTIC ASSISTED TOTAL HYSTERECTOMY WITH BILATERAL SALPINGO OOPHORECTOMY;  Surgeon: Everitt Amber, MD;  Location: [facility];  Service: Gynecology;  Laterality: Bilateral;  . SPINE SURGERY    . TONSILLECTOMY    . UTERINE ARTERY EMBOLIZATION  2008   for uterine bleeding  . 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 Influenza,inj,Quad PF,6+ Mos 06/14/2019  . Tdap 02/03/2015     Objective: Vital Signs: BP (!) 150/73 (BP Location: Left Arm, Patient Position: Sitting, Cuff Size: Large)   Pulse 73   Resp 17   Ht 5\' 9"  (1.753 m)   Wt 280 lb 12.8 oz (127.4 kg)   BMI 41.47 kg/m    Physical Exam Vitals and nursing note reviewed.  Constitutional:      Appearance: She is well-developed.  HENT:     Head: Normocephalic and atraumatic.  Eyes:     Conjunctiva/sclera: Conjunctivae normal.  Pulmonary:     Effort: Pulmonary effort is normal.  Abdominal:     General: Bowel sounds are normal.     Palpations: Abdomen is soft.  Musculoskeletal:     Cervical back: Normal range of motion.  Lymphadenopathy:     Cervical: No cervical adenopathy.  Skin:    General: Skin is warm and dry.     Capillary Refill: Capillary refill takes less than 2 seconds.  Neurological:     Mental Status: She is alert and oriented to person,  place, and time.  Psychiatric:        Behavior: Behavior normal.      Musculoskeletal Exam: C-spine, thoracic spine, and lumbar spine good ROM.  Shoulder joints, elbow joints, wrist joints, MCPs, PIPS, and DIPs good ROM with no synovitis.  Complete fist formation bilaterally.  Hip joints, knee joints, ankle joints, MTPs, PIPs, and DIPs good ROM with no synovitis.  No warmth or effusion of knee joints.  No tenderness or swelling of ankle joints.    CDAI Exam: CDAI Score: -- Patient Global: --; Provider Global: -- Swollen: --; Tender: -- Joint Exam 04/07/2020   No joint exam has been documented for this visit   There is currently no information documented on the homunculus. Go to the Rheumatology activity and complete the homunculus joint exam.  Investigation: No additional findings.  Imaging: No results found.  Recent Labs: Lab Results   Component Value Date   WBC 6.9 01/21/2020   HGB 11.9 01/21/2020   PLT 252 01/21/2020   NA 136 01/21/2020   K 4.2 01/21/2020   CL 98 01/21/2020   CO2 27 01/21/2020   GLUCOSE 97 01/21/2020   BUN 24 01/21/2020   CREATININE 1.13 (H) 01/21/2020   BILITOT 0.3 01/21/2020   ALKPHOS 80 08/14/2018   AST 16 01/21/2020   ALT 12 01/21/2020   PROT 7.3 01/21/2020   ALBUMIN 4.3 08/14/2018   CALCIUM 9.8 01/21/2020   GFRAA 60 01/21/2020   QFTBGOLDPLUS NEGATIVE 01/15/2019    Speciality Comments: PLQ Eye Exam 01/25/19 WNL @ [facility]  Follow up in 6 months   Procedures:  No procedures performed Allergies: Methylprednisolone, Sulfa antibiotics, and Sulfonamide derivatives   Assessment / Plan:     Visit Diagnoses: Rheumatoid arthritis with rheumatoid factor of multiple sites without organ or systems involvement (HCC) - RF 74, CCP >250 ,Sed rate 45, ANA 1: 320 cytoplasmic, positive synovitis: She has no tenderness or synovitis on exam. She experiences intermittent joint stiffness but no inflammation. She has not had any recent rheumatoid arthritis flares.  She has clinically been doing well on Plaquenil 200 mg 1 tablet by mouth twice daily.  She has been tolerating Plaquenil without any side effects.  She continues to have recurrent bouts of folliculitis and was evaluated by Dr. Ronnald Ramp several months ago.  She was encouraged to use antimicrobial body wash on a daily basis.  She has not had any other infections recently.  She has received both COVID-19 vaccinations.  She will continue taking Plaquenil 200 mg 1 tablet by mouth twice daily.  Refill of Plaquenil sent to the pharmacy on 03/31/20.  Lab work will be updated in October 2021.  She was advised to notify us if she develops increased joint pain or joint swelling.  She will follow up in 5 months.   High risk medication use - PLQ 200 mg BID-Started in May 2020. CBC and CMP were drawn on 01/21/20.  She will be due to update lab work in October and  every 5 months. Standing orders for CBC and CMP are in place. Last Plaquenil eye exam normal on 01/25/2019. She has an upcoming PLQ eye exam scheduled in September at [facility].    She has received both covid-19 vaccinations.  She was encouraged to continue to wear a mask and social distance.  She was advised to notify us if she is diagnosed with covid-19 in order to receive the covid antibody infusion.    Primary osteoarthritis of both hands:  She is not having any discomfort in her hands at this time.  No tenderness or inflammation noted. She is able to make a complete fist bilaterally.   Primary osteoarthritis of both knees: She has good ROM of both knee joints.  No warmth or effusion of knee joints noted.  Bilateral knee crepitus.   Primary osteoarthritis of both feet: She is not having any discomfort in her feet at this time.  She is wearing proper fitting shoes.   Positive ANA (antinuclear antibody) - ENA, C3-C4, anticardiolipin, lupus anticoagulant, beta-2 GP 1-were all negative.  She has no other clinical features of autoimmune disease.   Other medical conditions are listed as follows:   History of pulmonary embolism  PVC's (premature ventricular contractions)  History of DVT (deep vein thrombosis)  Hyperlipidemia with target LDL less than 130  History of atrial fibrillation  History of anemia  Essential hypertension  Orders: No orders of the defined types were placed in this encounter.  No orders of the defined types were placed in this encounter.    Follow-Up Instructions: Return in about 5 months (around 09/07/2020) for Rheumatoid arthritis, Osteoarthritis.   Ofilia Neas, PA-C  Note - This record has been created using Dragon software.  Chart creation errors have been sought, but may not always  have been located. Such creation errors do not reflect on  the standard of medical care.

## 2020-03-31 ENCOUNTER — Other Ambulatory Visit: Payer: Self-pay | Admitting: Rheumatology

## 2020-03-31 DIAGNOSIS — M0579 Rheumatoid arthritis with rheumatoid factor of multiple sites without organ or systems involvement: Secondary | ICD-10-CM

## 2020-03-31 MED ORDER — HYDROXYCHLOROQUINE SULFATE 200 MG PO TABS: 200.0000 mg | ORAL_TABLET | Freq: Two times a day (BID) | ORAL | 0 refills | Status: DC

## 2020-03-31 MED FILL — HYDROXYCHLOROQUINE 200 MG T: 200 | 90 days supply | Qty: 180 | Fill #0

## 2020-03-31 NOTE — Telephone Encounter (Signed)
Last Visit: 01/21/2020 Next Visit: 04/07/2020 Labs: 01/21/2020 CBC WNL. Creatinine is elevated and GFR WNL. CMP WNL. Eye exam: 01/25/19 WNL   Current Dose per office note 01/21/2020: PLQ 200 mg BID DX:  Rheumatoid arthritis with rheumatoid factor of multiple sites without organ or systems involvement   Okay to refill Plaquenil?

## 2020-03-31 NOTE — Telephone Encounter (Signed)
Patient left a voicemail requesting prescription refill of Plaquenil.  Patient states she only has 4 tablets remaining.

## 2020-04-07 ENCOUNTER — Encounter: Payer: Self-pay | Admitting: Physician Assistant

## 2020-04-07 ENCOUNTER — Other Ambulatory Visit: Payer: Self-pay

## 2020-04-07 ENCOUNTER — Ambulatory Visit: Payer: 59 | Admitting: Physician Assistant

## 2020-04-07 VITALS — BP 150/73 | HR 73 | Resp 17 | Ht 69.0 in | Wt 280.8 lb

## 2020-04-07 DIAGNOSIS — Z79899 Other long term (current) drug therapy: Secondary | ICD-10-CM | POA: Diagnosis not present

## 2020-04-07 DIAGNOSIS — I493 Ventricular premature depolarization: Secondary | ICD-10-CM | POA: Diagnosis not present

## 2020-04-07 DIAGNOSIS — Z86718 Personal history of other venous thrombosis and embolism: Secondary | ICD-10-CM | POA: Diagnosis not present

## 2020-04-07 DIAGNOSIS — Z86711 Personal history of pulmonary embolism: Secondary | ICD-10-CM

## 2020-04-07 DIAGNOSIS — I10 Essential (primary) hypertension: Secondary | ICD-10-CM

## 2020-04-07 DIAGNOSIS — M19072 Primary osteoarthritis, left ankle and foot: Secondary | ICD-10-CM

## 2020-04-07 DIAGNOSIS — M19041 Primary osteoarthritis, right hand: Secondary | ICD-10-CM | POA: Diagnosis not present

## 2020-04-07 DIAGNOSIS — E785 Hyperlipidemia, unspecified: Secondary | ICD-10-CM

## 2020-04-07 DIAGNOSIS — M19071 Primary osteoarthritis, right ankle and foot: Secondary | ICD-10-CM | POA: Diagnosis not present

## 2020-04-07 DIAGNOSIS — M0579 Rheumatoid arthritis with rheumatoid factor of multiple sites without organ or systems involvement: Secondary | ICD-10-CM | POA: Diagnosis not present

## 2020-04-07 DIAGNOSIS — Z8679 Personal history of other diseases of the circulatory system: Secondary | ICD-10-CM

## 2020-04-07 DIAGNOSIS — M19042 Primary osteoarthritis, left hand: Secondary | ICD-10-CM

## 2020-04-07 DIAGNOSIS — Z862 Personal history of diseases of the blood and blood-forming organs and certain disorders involving the immune mechanism: Secondary | ICD-10-CM

## 2020-04-07 DIAGNOSIS — R768 Other specified abnormal immunological findings in serum: Secondary | ICD-10-CM | POA: Diagnosis not present

## 2020-04-07 DIAGNOSIS — M170 Bilateral primary osteoarthritis of knee: Secondary | ICD-10-CM

## 2020-04-07 NOTE — Patient Instructions (Signed)
Standing Labs We placed an order today for your standing lab work.   Please have your standing labs drawn in October and every 5 months   If possible, please have your labs drawn 2 weeks prior to your appointment so that the provider can discuss your results at your appointment.  We have open lab daily Monday through Thursday from 8:30-12:30 PM and 1:30-4:30 PM and Friday from 8:30-12:30 PM and 1:30-4:00 PM at the office of Dr. Bo Merino, [facility].   Please be advised, patients with office appointments requiring lab work will take precedents over walk-in lab work.  If possible, please come for your lab work on Monday and Friday afternoons, as you may experience shorter wait times. The office is located at [address], [place], [place], [place] [address] No appointment is necessary.   Labs are drawn by [facility]. Please bring your co-pay at the time of your lab draw.  You may receive a bill from [facility] for your lab work.  If you wish to have your labs drawn at another location, please call the office 24 hours in advance to send orders.  If you have any questions regarding directions or hours of operation,  please call [phone].   As a reminder, please drink plenty of water prior to coming for your lab work. Thanks!  COVID-19 vaccine recommendations:   COVID-19 vaccine is recommended for everyone (unless you are allergic to a vaccine component), even if you are on a medication that suppresses your immune system.   If you are on Methotrexate, Cellcept (mycophenolate), Rinvoq, Morrie Sheldon, and Olumiant- hold the medication for 1 week after each vaccine. Hold Methotrexate for 2 weeks after the single dose COVID-19 vaccine.   If you are on Orencia subcutaneous injection - hold medication one week prior to and one week after the first COVID-19 vaccine dose (only).   If you are on Orencia IV infusions- time vaccination administration so that the first COVID-19 vaccination  will occur four weeks after the infusion and postpone the subsequent infusion by one week.   If you are on Cyclophosphamide or Rituxan infusions please contact your doctor prior to receiving the COVID-19 vaccine.   Do not take Tylenol or ant anti-inflammatory medications (NSAIDs) 24 hours prior to the COVID-19 vaccination.   There is no direct evidence about the efficacy of the COVID-19 vaccine in individuals who are on medications that suppress the immune system.   Even if you are fully vaccinated, and you are on any medications that suppress your immune system, please continue to wear a mask, maintain at least six feet social distance and practice hand hygiene.   If you develop a COVID-19 infection, please contact your PCP or our office to determine if you need antibody infusion.  We anticipate that a booster vaccine will be available soon for immunosuppressed individuals. Please cal our office before receiving your booster dose to make adjustments to your medication regimen.

## 2020-04-28 ENCOUNTER — Other Ambulatory Visit: Payer: Self-pay

## 2020-04-28 ENCOUNTER — Other Ambulatory Visit: Payer: Self-pay | Admitting: Internal Medicine

## 2020-04-28 DIAGNOSIS — I10 Essential (primary) hypertension: Secondary | ICD-10-CM

## 2020-04-28 MED ORDER — LOSARTAN POTASSIUM 25 MG PO TABS: 25.0000 mg | ORAL_TABLET | Freq: Every day | ORAL | 0 refills | Status: DC

## 2020-04-28 MED FILL — LOSARTAN POTASSIUM 25 MG TA: 25 | 60 days supply | Qty: 60 | Fill #4

## 2020-04-28 MED FILL — INDAPAMIDE 1.25 MG TABS: 1.25 | 90 days supply | Qty: 90 | Fill #0

## 2020-06-12 ENCOUNTER — Telehealth: Payer: Self-pay | Admitting: Internal Medicine

## 2020-06-12 ENCOUNTER — Other Ambulatory Visit: Payer: Self-pay | Admitting: Internal Medicine

## 2020-06-12 MED ORDER — NADOLOL 20 MG PO TABS: 20.0000 mg | ORAL_TABLET | Freq: Every day | ORAL | 0 refills | Status: DC

## 2020-06-12 MED FILL — NADOLOL 20 MG TABS: 20 | 30 days supply | Qty: 30 | Fill #0

## 2020-06-12 NOTE — Telephone Encounter (Signed)
New message    *STAT* If patient is at the pharmacy, call can be transferred to refill team.   1. Which medications need to be refilled? (please list name of each medication and dose if known) nadolol 20 mg, losartan 25 mg  2. Which pharmacy/location (including street and city if local pharmacy) is medication to be sent to? [facility]   3. Do they need a 30 day or 90 day supply? 30 day

## 2020-06-12 NOTE — Telephone Encounter (Signed)
30 day supply of Nadolol has been sent to [facility], per pt request. Pt should have enough Losartan until 07/29/20, as last refill was 04/28/20 for 90 days supply. Pt is scheduled to see Dr. Rayann Heman 06/30/20.

## 2020-06-17 ENCOUNTER — Other Ambulatory Visit: Payer: Self-pay | Admitting: Student

## 2020-06-17 MED FILL — LOSARTAN POTASSIUM 25 MG TA: 25 | 90 days supply | Qty: 90 | Fill #0

## 2020-06-17 MED FILL — XARELTO 20 MG TABLET: 20 | 90 days supply | Qty: 90 | Fill #0

## 2020-06-17 NOTE — Telephone Encounter (Signed)
Prescription refill request for Xarelto received.   Last office visit: 06/18/2019, [place] Weight: 127.4 kg Age: 64 y.o. Scr: 1.13, 01/21/2020 CrCl: 100.84 ml/min    Appt to see Allred 06/30/2020, Prescription refill sent.

## 2020-06-19 ENCOUNTER — Other Ambulatory Visit: Payer: Self-pay | Admitting: Internal Medicine

## 2020-06-19 ENCOUNTER — Other Ambulatory Visit: Payer: Self-pay

## 2020-06-19 DIAGNOSIS — I480 Paroxysmal atrial fibrillation: Secondary | ICD-10-CM

## 2020-06-19 MED ORDER — RIVAROXABAN 20 MG PO TABS: ORAL_TABLET | ORAL | 0 refills | Status: DC

## 2020-06-19 MED ORDER — LOSARTAN POTASSIUM 25 MG PO TABS: 25.0000 mg | ORAL_TABLET | Freq: Every day | ORAL | 0 refills | Status: DC

## 2020-06-19 NOTE — Telephone Encounter (Signed)
Prescription refill request for Xarelto received.  Indication: a fib Last office visit: 06/13/19 Weight: 280 Age: 64 Scr: 1.13 CrCl: 1.1 mL/min  Last office visit over a year ago but patient has appointment with Dr Rayann Heman on 06/30/20.  Will send 30 day supply

## 2020-06-23 ENCOUNTER — Ambulatory Visit: Payer: 59 | Admitting: Internal Medicine

## 2020-06-30 ENCOUNTER — Other Ambulatory Visit: Payer: Self-pay | Admitting: Internal Medicine

## 2020-06-30 ENCOUNTER — Other Ambulatory Visit: Payer: Self-pay

## 2020-06-30 ENCOUNTER — Encounter: Payer: Self-pay | Admitting: Internal Medicine

## 2020-06-30 ENCOUNTER — Ambulatory Visit: Payer: 59 | Admitting: Internal Medicine

## 2020-06-30 VITALS — BP 142/80 | HR 82 | Ht 69.0 in | Wt 277.2 lb

## 2020-06-30 DIAGNOSIS — I10 Essential (primary) hypertension: Secondary | ICD-10-CM

## 2020-06-30 DIAGNOSIS — I480 Paroxysmal atrial fibrillation: Secondary | ICD-10-CM

## 2020-06-30 DIAGNOSIS — I493 Ventricular premature depolarization: Secondary | ICD-10-CM | POA: Diagnosis not present

## 2020-06-30 MED ORDER — LOSARTAN POTASSIUM 50 MG PO TABS: 50.0000 mg | ORAL_TABLET | Freq: Every day | ORAL | 3 refills | Status: DC

## 2020-06-30 MED FILL — LOSARTAN POTASSIUM 50 MG TA: 50 | 90 days supply | Qty: 90 | Fill #0

## 2020-06-30 NOTE — Progress Notes (Signed)
PCP: Janith Lima, MD   Primary EP: Dr Rayann Heman  Pamela Davenport is a 64 y.o. female who presents today for routine electrophysiology followup.  Since I saw her last, she has been diagnosed with rheumatoid arthritis.  She continues to work and is doing reasonably well.  Today, she denies symptoms of palpitations, chest pain, shortness of breath,  lower extremity edema, dizziness, presyncope, or syncope.  The patient is otherwise without complaint today.   Past Medical History:  Diagnosis Date  . Atypical chest pain    normal myoview 11/11/10  . Blood transfusion without reported diagnosis   . DVT (deep venous thrombosis) (HCC)    recurrent; chronically anticoagulated with coumadin  . Family history of anesthesia complication    Sister had decrease in respirations after surgery  . Heart murmur   . Herniated lumbar intervertebral disc   . Hypertension   . Paroxysmal atrial fibrillation (HCC)    SINCE AGE 24   . PE (pulmonary embolism)    chronically anticoagulated with coumadin  . Pneumonia 07/20/12  . PONV (postoperative nausea and vomiting)   . Sickle cell trait ([place])   . Sinus congestion 08/14/2018   C/O NASAL/CHEST  CONGESTION, COUGHING WITH PHLEGM, PER PATIENT , SHE IS TO BEGIN TAKING AUGMENTIN DOSE PACK TODAY;    . Strep throat at age 25   The patient reports a severe strep throat infection at age 37 and   is not clear as to whether or not she may have had rheumatic fever.  . Symptomatic premature ventricular contractions    improved s/p ablation  . Urgency of urination   . Uterine bleeding 2008   Uterine artery embolization in 2008 for uterine bleeding.   Past Surgical History:  Procedure Laterality Date  . COLONOSCOPY W/ POLYPECTOMY    . Diagnostic D&C hysteroscopy and Novasure ablation  03/17/2004  . DILATION AND CURETTAGE OF UTERUS  12/17/2011   Procedure: DILATATION AND CURETTAGE;  Surgeon: Frederico Hamman, MD;  Location: [facility];  Service: Gynecology;   Laterality: N/A;  With Attempted Hydrothermal Ablation  . LUMBAR LAMINECTOMY/DECOMPRESSION MICRODISCECTOMY Left 02/21/2013   Procedure: LUMBAR LAMINECTOMY/DECOMPRESSION MICRODISCECTOMY 1 LEVEL;  Surgeon: Winfield Cunas, MD;  Location: [facility];  Service: Neurosurgery;  Laterality: Left;  LEFT Lumbar Three-Four Laminotomy foraminotomy microdiskectomy  . MANDIBLE SURGERY     under bite  . PVC Ablation  2010  . ROBOTIC ASSISTED TOTAL HYSTERECTOMY WITH BILATERAL SALPINGO OOPHERECTOMY Bilateral 08/21/2018   Procedure: XI ROBOTIC ASSISTED TOTAL HYSTERECTOMY WITH BILATERAL SALPINGO OOPHORECTOMY;  Surgeon: Everitt Amber, MD;  Location: [facility];  Service: Gynecology;  Laterality: Bilateral;  . SPINE SURGERY    . TONSILLECTOMY    . UTERINE ARTERY EMBOLIZATION  2008   for uterine bleeding  . VASCULAR SURGERY     laser of left leg    ROS- all systems are reviewed and negatives except as per HPI above  Current Outpatient Medications  Medication Sig Dispense Refill  . flecainide (TAMBOCOR) 150 MG tablet Take 2 tablets by mouth at the onset of AFIB 30 tablet 3  . hydroxychloroquine (PLAQUENIL) 200 MG tablet Take 1 tablet (200 mg total) by mouth 2 (two) times daily. 180 tablet 0  . indapamide (LOZOL) 1.25 MG tablet TAKE 1 TABLET BY MOUTH ONCE DAILY 90 tablet 0  . losartan (COZAAR) 25 MG tablet Take 1 tablet (25 mg total) by mouth daily. Please keep upcoming appt in November with Dr. Rayann Heman before anymore  refills. Thank you 90 tablet 0  . nadolol (CORGARD) 20 MG tablet Take 1 tablet (20 mg total) by mouth daily. 30 tablet 0  . rivaroxaban (XARELTO) 20 MG TABS tablet TAKE 1 TABLET BY MOUTH ONCE DAILY WITH SUPPER 30 tablet 0  . TURMERIC PO Take by mouth.      No current facility-administered medications for this visit.    Physical Exam: Vitals:   06/30/20 0841  BP: (!) 142/80  Pulse: 82  SpO2: 94%  Weight: 277 lb 3.2 oz (125.7 kg)  Height: 5\' 9"  (1.753 m)    GEN- The patient  is well appearing, alert and oriented x 3 today.   Head- normocephalic, atraumatic Eyes-  Sclera clear, conjunctiva pink Ears- hearing intact Oropharynx- clear Lungs- Clear to ausculation bilaterally, normal work of breathing Heart- Regular rate and rhythm, no murmurs, rubs or gallops, PMI not laterally displaced GI- soft, NT, ND, + BS Extremities- no clubbing, cyanosis, or edema  Wt Readings from Last 3 Encounters:  06/30/20 277 lb 3.2 oz (125.7 kg)  04/07/20 280 lb 12.8 oz (127.4 kg)  01/21/20 280 lb (127 kg)    EKG tracing ordered today is personally reviewed and shows sinus, PR 182 msec, QRS 102 msec, QTc 457 msec  Assessment and Plan:  1. PVCs Well controlled with nadolol and prn flecainide  2. Paroxysmal atrial fibrillation Well controlled chads2vasc score is 2.  She also has a h/o recurrent DVT/PTE She is doing well with xarleto  3, HTN Elevated today Increase cozaar to 50mg  daily She wishes to have repeat bmet by PCP  4. Overweight Lifestyle modification advised Body mass index is 40.94 kg/m.   Risks, benefits and potential toxicities for medications prescribed and/or refilled reviewed with patient today.   Return to see EP PA in a year  Thompson Grayer MD, [facility] 06/30/2020 8:46 AM

## 2020-06-30 NOTE — Patient Instructions (Addendum)
Medication Instructions:  Increase you Losartan from 25 mg to 50 mg  Take one tablet by mouth daily.   *If you need a refill on your cardiac medications before your next appointment, please call your pharmacy*  Lab Work: None ordered.  If you have labs (blood work) drawn today and your tests are completely normal, you will receive your results only by: Marland Kitchen MyChart Message (if you have MyChart) OR . A paper copy in the mail If you have any lab test that is abnormal or we need to change your treatment, we will call you to review the results.  Testing/Procedures: None ordered.  Follow-Up: At [facility], you and your health needs are our priority.  As part of our continuing mission to provide you with exceptional heart care, we have created designated Provider Care Teams.  These Care Teams include your primary Cardiologist (physician) and Advanced Practice Providers (APPs -  Physician Assistants and Nurse Practitioners) who all work together to provide you with the care you need, when you need it.  We recommend signing up for the patient portal called "MyChart".  Sign up information is provided on this After Visit Summary.  MyChart is used to connect with patients for Virtual Visits (Telemedicine).  Patients are able to view lab/test results, encounter notes, upcoming appointments, etc.  Non-urgent messages can be sent to your provider as well.   To learn more about what you can do with MyChart, go to [email or web address].    Your next appointment:   Your physician wants you to follow-up in: 1 year with Pamela Davenport. You will receive a reminder letter in the mail two months in advance. If you don't receive a letter, please call our office to schedule the follow-up appointment.    Other Instructions:

## 2020-07-16 ENCOUNTER — Telehealth: Payer: Self-pay | Admitting: Rheumatology

## 2020-07-16 ENCOUNTER — Telehealth: Payer: Self-pay

## 2020-07-16 ENCOUNTER — Other Ambulatory Visit: Payer: Self-pay | Admitting: *Deleted

## 2020-07-16 DIAGNOSIS — M0579 Rheumatoid arthritis with rheumatoid factor of multiple sites without organ or systems involvement: Secondary | ICD-10-CM

## 2020-07-16 DIAGNOSIS — Z79899 Other long term (current) drug therapy: Secondary | ICD-10-CM | POA: Diagnosis not present

## 2020-07-16 MED ORDER — HYDROXYCHLOROQUINE SULFATE 200 MG PO TABS: 200.0000 mg | ORAL_TABLET | Freq: Two times a day (BID) | ORAL | 0 refills | Status: DC

## 2020-07-16 MED FILL — LOSARTAN POTASSIUM 50 MG TA: 50 | 90 days supply | Qty: 90 | Fill #0

## 2020-07-16 MED FILL — HYDROXYCHLOROQUINE 200 MG T: 200 | 30 days supply | Qty: 60 | Fill #0

## 2020-07-16 NOTE — Telephone Encounter (Signed)
Called patient to schedule her follow-up appointment.  Patient states she will be coming to the office today to have labwork and will schedule at that time.

## 2020-07-16 NOTE — Telephone Encounter (Signed)
Patient called requesting prescription refill of Plaquenil to be sent to [facility].  Patient states she only has 5 tablets remaining.

## 2020-07-16 NOTE — Telephone Encounter (Signed)
Last Visit: 04/07/2020 Next Visit: due January 2022. Message sent to the front to schedule.  Labs: 01/21/2020 CBC WNL. Creatinine is elevated and GFR WNL. CMP WNL. Eye exam: 01/25/19 WNL  Current Dose per office note 05/07/2020: PLQ 200 mg BID DX: Rheumatoid arthritis with rheumatoid factor of multiple sites without organ or systems involvement   Patient advised she due to update labs and her PLQ eye exam has been rescheduled. Patient is coming to update labs this morning. Patient will call back to let us know when her PLQ eye exam is scheduled.   Okay to refill Plaquenil?

## 2020-07-16 NOTE — Telephone Encounter (Signed)
Please schedule patient for a follow up visit. Patient due January 2022. Thanks!

## 2020-07-17 ENCOUNTER — Other Ambulatory Visit: Payer: Self-pay | Admitting: Internal Medicine

## 2020-07-17 LAB — COMPLETE METABOLIC PANEL WITH GFR
AG Ratio: 1.5 (calc) (ref 1.0–2.5)
ALT: 12 U/L (ref 6–29)
AST: 19 U/L (ref 10–35)
Albumin: 4 g/dL (ref 3.6–5.1)
Alkaline phosphatase (APISO): 67 U/L (ref 37–153)
BUN: 21 mg/dL (ref 7–25)
CO2: 25 mmol/L (ref 20–32)
Calcium: 9.5 mg/dL (ref 8.6–10.4)
Chloride: 105 mmol/L (ref 98–110)
Creat: 0.97 mg/dL (ref 0.50–0.99)
GFR, Est African American: 72 mL/min/{1.73_m2} (ref >=60)
GFR, Est Non African American: 62 mL/min/{1.73_m2} (ref >=60)
Globulin: 2.6 g/dL (calc) (ref 1.9–3.7)
Glucose, Bld: 98 mg/dL (ref 65–99)
Potassium: 4.5 mmol/L (ref 3.5–5.3)
Sodium: 139 mmol/L (ref 135–146)
Total Bilirubin: 0.4 mg/dL (ref 0.2–1.2)
Total Protein: 6.6 g/dL (ref 6.1–8.1)

## 2020-07-17 LAB — CBC WITH DIFFERENTIAL/PLATELET
Absolute Monocytes: 465 cells/uL (ref 200–950)
Basophils Absolute: 28 cells/uL (ref 0–200)
Basophils Relative: 0.5 %
Eosinophils Absolute: 123 cells/uL (ref 15–500)
Eosinophils Relative: 2.2 %
HCT: 34.4 % — ABNORMAL LOW (ref 35.0–45.0)
Hemoglobin: 11.3 g/dL — ABNORMAL LOW (ref 11.7–15.5)
Lymphs Abs: 1366 cells/uL (ref 850–3900)
MCH: 27.4 pg (ref 27.0–33.0)
MCHC: 32.8 g/dL (ref 32.0–36.0)
MCV: 83.5 fL (ref 80.0–100.0)
MPV: 10.3 fL (ref 7.5–12.5)
Monocytes Relative: 8.3 %
Neutro Abs: 3618 cells/uL (ref 1500–7800)
Neutrophils Relative %: 64.6 %
Platelets: 246 10*3/uL (ref 140–400)
RBC: 4.12 10*6/uL (ref 3.80–5.10)
RDW: 12.8 % (ref 11.0–15.0)
Total Lymphocyte: 24.4 %
WBC: 5.6 10*3/uL (ref 3.8–10.8)

## 2020-07-17 MED FILL — NADOLOL 20 MG TABS: 20 | 90 days supply | Qty: 90 | Fill #0

## 2020-07-17 NOTE — Telephone Encounter (Signed)
Opened in error

## 2020-07-17 NOTE — Progress Notes (Signed)
Hemoglobin and hematocrit are borderline low.  Please notify the patient and advise her to take a multivitamin with iron.   Rest of CBC WNL. CMP WNL

## 2020-07-17 NOTE — Telephone Encounter (Signed)
*  STAT* If patient is at the pharmacy, call can be transferred to refill team.   1. Which medications need to be refilled? (please list name of each medication and dose if known) Nadolol  2. Which pharmacy/location (including street and city if local pharmacy) is medication to be sent to? [facility]  3. Do they need a 30 day or 90 day supply? 90 days and refills

## 2020-07-28 MED FILL — NADOLOL 20 MG TABS: 20 | 90 days supply | Qty: 90 | Fill #0

## 2020-08-19 ENCOUNTER — Ambulatory Visit: Payer: 59 | Attending: Internal Medicine

## 2020-08-19 DIAGNOSIS — Z23 Encounter for immunization: Secondary | ICD-10-CM

## 2020-08-19 NOTE — Progress Notes (Signed)
   Covid-19 Vaccination Clinic  Name:  Pamela Davenport    MRN: [id number] DOB: 1956-06-09  08/19/2020  [name] was observed post Covid-19 immunization for 15 minutes without incident. She was provided with Vaccine Information Sheet and instruction to access the V-Safe system.   [name] was instructed to call 911 with any severe reactions post vaccine: Marland Kitchen Difficulty breathing  . Swelling of face and throat  . A fast heartbeat  . A bad rash all over body  . Dizziness and weakness   Immunizations Administered    Name Date Dose VIS Date Route   Pfizer COVID-19 Vaccine 08/19/2020  3:35 PM 0.3 mL 06/18/2020 Intramuscular   Manufacturer: [place]   Lot: [id number]   [place]: [id number]

## 2020-08-27 NOTE — Progress Notes (Deleted)
Office Visit Note  Patient: Pamela Davenport             Date of Birth: 02-06-1956           MRN: [id number]             PCP: [name], MD Referring: [name], MD Visit Date: 09/08/2020 Occupation: @GUAROCC @  Subjective:  No chief complaint on file.   History of Present Illness: Pamela Davenport is a 64 y.o. female ***   Activities of Daily Living:  Patient reports morning stiffness for *** {minute/hour:19697}.   Patient {ACTIONS;DENIES/REPORTS:21021675::"Denies"} nocturnal pain.  Difficulty dressing/grooming: {ACTIONS;DENIES/REPORTS:21021675::"Denies"} Difficulty climbing stairs: {ACTIONS;DENIES/REPORTS:21021675::"Denies"} Difficulty getting out of chair: {ACTIONS;DENIES/REPORTS:21021675::"Denies"} Difficulty using hands for taps, buttons, cutlery, and/or writing: {ACTIONS;DENIES/REPORTS:21021675::"Denies"}  No Rheumatology ROS completed.   PMFS History:  Patient Active Problem List   Diagnosis Date Noted  . GERD without esophagitis 10/04/2019  . Vitamin D deficiency disease 09/25/2019  . Hypercoagulable state (HCC) 09/24/2019  . Iron deficiency anemia 12/07/2018  . Other dietary vitamin B12 deficiency anemia 12/07/2018  . Fibroid uterus 08/21/2018  . De Quervain's tenosynovitis 03/01/2018  . Visit for screening mammogram 04/18/2017  . Cervical cancer screening 04/18/2017  . Hyperlipidemia with target LDL less than 130 10/19/2016  . Routine general medical examination at a health care facility 02/03/2015  . Essential hypertension 01/13/2015  . Chronic pulmonary embolism (HCC) 12/05/2008  . ATRIAL FIBRILLATION 12/05/2008  . PVC's (premature ventricular contractions) 12/05/2008  . DVT 12/05/2008    Past Medical History:  Diagnosis Date  . Atypical chest pain    normal myoview 11/11/10  . Blood transfusion without reported diagnosis   . DVT (deep venous thrombosis) (HCC)    recurrent; chronically anticoagulated with coumadin  . Family history of  anesthesia complication    Sister had decrease in respirations after surgery  . Heart murmur   . Herniated lumbar intervertebral disc   . Hypertension   . Paroxysmal atrial fibrillation (HCC)    SINCE AGE 55   . PE (pulmonary embolism)    chronically anticoagulated with coumadin  . Pneumonia 07/20/12  . PONV (postoperative nausea and vomiting)   . Sickle cell trait (HCC)   . Sinus congestion 08/14/2018   C/O NASAL/CHEST  CONGESTION, COUGHING WITH PHLEGM, PER PATIENT , SHE IS TO BEGIN TAKING AUGMENTIN DOSE PACK TODAY;    . Strep throat at age 66   The patient reports a severe strep throat infection at age 70 and   is not clear as to whether or not she may have had rheumatic fever.  . Symptomatic premature ventricular contractions    improved s/p ablation  . Urgency of urination   . Uterine bleeding 2008   Uterine artery embolization in 2008 for uterine bleeding.    Family History  Problem Relation Age of Onset  . Hypertension Mother   . Diabetes Mother   . Uterine cancer Sister   . Prostate cancer Brother   . Prostate cancer Paternal Grandmother   . Colon cancer Paternal Grandfather   . Prostate cancer Brother   . Prostate cancer Brother   . Healthy Son    Past Surgical History:  Procedure Laterality Date  . COLONOSCOPY W/ POLYPECTOMY    . Diagnostic D&C hysteroscopy and Novasure ablation  03/17/2004  . DILATION AND CURETTAGE OF UTERUS  12/17/2011   Procedure: DILATATION AND CURETTAGE;  Surgeon: 12/19/2011, MD;  Location: [facility];  Service: Gynecology;  Laterality: N/A;  With  Attempted Hydrothermal Ablation  . LUMBAR LAMINECTOMY/DECOMPRESSION MICRODISCECTOMY Left 02/21/2013   Procedure: LUMBAR LAMINECTOMY/DECOMPRESSION MICRODISCECTOMY 1 LEVEL;  Surgeon: Winfield Cunas, MD;  Location: [facility];  Service: Neurosurgery;  Laterality: Left;  LEFT Lumbar Three-Four Laminotomy foraminotomy microdiskectomy  . MANDIBLE SURGERY     under bite  . PVC Ablation  2010  .  ROBOTIC ASSISTED TOTAL HYSTERECTOMY WITH BILATERAL SALPINGO OOPHERECTOMY Bilateral 08/21/2018   Procedure: XI ROBOTIC ASSISTED TOTAL HYSTERECTOMY WITH BILATERAL SALPINGO OOPHORECTOMY;  Surgeon: Everitt Amber, MD;  Location: [facility];  Service: Gynecology;  Laterality: Bilateral;  . SPINE SURGERY    . TONSILLECTOMY    . UTERINE ARTERY EMBOLIZATION  2008   for uterine bleeding  . 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 Influenza,inj,Quad PF,6+ Mos 06/14/2019  . PFIZER SARS-COV-2 Vaccination 08/19/2020  . Tdap 02/03/2015     Objective: Vital Signs: There were no vitals taken for this visit.   Physical Exam   Musculoskeletal Exam: ***  CDAI Exam: CDAI Score: -- Patient Global: --; Provider Global: -- Swollen: --; Tender: -- Joint Exam 09/08/2020   No joint exam has been documented for this visit   There is currently no information documented on the homunculus. Go to the Rheumatology activity and complete the homunculus joint exam.  Investigation: No additional findings.  Imaging: No results found.  Recent Labs: Lab Results  Component Value Date   WBC 5.6 07/16/2020   HGB 11.3 (L) 07/16/2020   PLT 246 07/16/2020   NA 139 07/16/2020   K 4.5 07/16/2020   CL 105 07/16/2020   CO2 25 07/16/2020   GLUCOSE 98 07/16/2020   BUN 21 07/16/2020   CREATININE 0.97 07/16/2020   BILITOT 0.4 07/16/2020   ALKPHOS 80 08/14/2018   AST 19 07/16/2020   ALT 12 07/16/2020   PROT 6.6 07/16/2020   ALBUMIN 4.3 08/14/2018   CALCIUM 9.5 07/16/2020   GFRAA 72 07/16/2020   QFTBGOLDPLUS NEGATIVE 01/15/2019    Speciality Comments: PLQ Eye Exam 01/25/19 WNL @ [facility]  Follow up in 6 months   Procedures:  No procedures performed Allergies: Methylprednisolone, Sulfa antibiotics, and Sulfonamide derivatives    Assessment / Plan:     Visit Diagnoses: No diagnosis found.  Orders: No orders of the defined types were placed in this encounter.  No orders of the defined types were placed in this encounter.   Face-to-face time spent with patient was *** minutes. Greater than 50% of time was spent in counseling and coordination of care.  Follow-Up Instructions: No follow-ups on file.   Earnestine Mealing, CMA  Note - This record has been created using Editor, commissioning.  Chart creation errors have been sought, but may not always  have been located. Such creation errors do not reflect on  the standard of medical care.

## 2020-08-28 ENCOUNTER — Other Ambulatory Visit: Payer: Self-pay | Admitting: Physician Assistant

## 2020-08-28 ENCOUNTER — Other Ambulatory Visit: Payer: Self-pay | Admitting: *Deleted

## 2020-08-28 DIAGNOSIS — M0579 Rheumatoid arthritis with rheumatoid factor of multiple sites without organ or systems involvement: Secondary | ICD-10-CM

## 2020-08-28 MED ORDER — HYDROXYCHLOROQUINE SULFATE 200 MG PO TABS: 200.0000 mg | ORAL_TABLET | Freq: Two times a day (BID) | ORAL | 0 refills | Status: DC

## 2020-08-28 MED FILL — HYDROXYCHLOROQUINE 200 MG T: 200 | 90 days supply | Qty: 180 | Fill #0

## 2020-08-28 NOTE — Telephone Encounter (Signed)
Patient requesting 90 day supply of Pamela Davenport, [phone]

## 2020-08-28 NOTE — Telephone Encounter (Signed)
Last Visit: 04/07/2020 Next Visit: 09/08/2020 Labs: 07/16/2020 Hemoglobin and hematocrit are borderline low. Rest of CBC WNL. CMP WNL PLQ Eye Exam 01/25/19 WNL  Patient advised she is due to update PLQ eye exam. Patient has it scheduled for 09/04/2020.  Current Dose per office note 04/07/2020: PLQ 200 mg BID DX: Rheumatoid arthritis with rheumatoid factor of multiple sites without organ or systems involvement   Okay to refill 90 day supply PLQ. Per patient request?

## 2020-09-04 NOTE — Progress Notes (Signed)
Office Visit Note  Patient: Pamela Davenport             Date of Birth: May 03, 1956           MRN: [id number]             PCP: [name], MD Referring: [name], MD Visit Date: 09/08/2020 Occupation: @GUAROCC @  Subjective:  Right thumb pain   History of Present Illness: Pamela Davenport is a 65 y.o. female with history of seropositive rheumatoid arthritis and osteoarthritis.  Patient is taking Plaquenil 200 mg 1 tablet by mouth twice daily.  She is tolerating Plaquenil without any side effects.  Patient reports that over the past several weeks she has been experiencing increased discomfort in the right thumb, both knees, and both hips.  She denies any joint swelling at this time.  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  She has tried using CBD cream topically which has provided minimal relief.  She denies any groin pain at this time and states that most of her hip pain is on the sides of her hip.  Her discomfort is exacerbated by laying on her sides at night.  She has had interrupted sleep at night due to nocturnal pain.  She takes Tylenol as needed for pain relief and ibuprofen very sparingly for pain relief.   Activities of Daily Living:  Patient reports morning stiffness for 15 minutes.   Patient Reports nocturnal pain.  Difficulty dressing/grooming: Denies Difficulty climbing stairs: Denies Difficulty getting out of chair: Reports Difficulty using hands for taps, buttons, cutlery, and/or writing: Reports  Review of Systems  Constitutional: Positive for fatigue.  HENT: Positive for mouth dryness. Negative for mouth sores and nose dryness.   Eyes: Positive for dryness. Negative for pain and visual disturbance.  Respiratory: Negative for cough, hemoptysis and difficulty breathing.   Cardiovascular: Positive for swelling in legs/feet. Negative for chest pain,  palpitations and hypertension.  Gastrointestinal: Positive for constipation. Negative for blood in stool and diarrhea.  Endocrine: Positive for increased urination.  Genitourinary: Negative for difficulty urinating and painful urination.  Musculoskeletal: Positive for arthralgias, gait problem, joint pain and morning stiffness. Negative for joint swelling, myalgias, muscle weakness, muscle tenderness and myalgias.  Skin: Negative for color change, pallor, rash, hair loss, nodules/bumps, skin tightness, ulcers and sensitivity to sunlight.  Allergic/Immunologic: Negative for susceptible to infections.  Neurological: Positive for weakness. Negative for dizziness, numbness and headaches.  Hematological: Negative for bruising/bleeding tendency and swollen glands.  Psychiatric/Behavioral: Positive for sleep disturbance. Negative for depressed mood. The patient is not nervous/anxious.     PMFS History:  Patient Active Problem List   Diagnosis Date Noted  . GERD without esophagitis 10/04/2019  . Vitamin D deficiency disease 09/25/2019  . Hypercoagulable state (HCC) 09/24/2019  . Iron deficiency anemia 12/07/2018  . Other dietary vitamin B12 deficiency anemia 12/07/2018  . Fibroid uterus 08/21/2018  . De Quervain's tenosynovitis 03/01/2018  . Visit for screening mammogram 04/18/2017  . Cervical cancer screening 04/18/2017  . Hyperlipidemia with target LDL less than 130 10/19/2016  . Routine general medical examination at a health care facility 02/03/2015  . Essential hypertension 01/13/2015  . Chronic pulmonary embolism (HCC) 12/05/2008  . ATRIAL FIBRILLATION 12/05/2008  . PVC's (premature ventricular contractions) 12/05/2008  . DVT 12/05/2008    Past Medical History:  Diagnosis Date  . Atypical chest  pain    normal myoview 11/11/10  . Blood transfusion without reported diagnosis   . DVT (deep venous thrombosis) (HCC)    recurrent; chronically anticoagulated with coumadin  . Family  history of anesthesia complication    Sister had decrease in respirations after surgery  . Heart murmur   . Herniated lumbar intervertebral disc   . Hypertension   . Paroxysmal atrial fibrillation (HCC)    SINCE AGE 45   . PE (pulmonary embolism)    chronically anticoagulated with coumadin  . Pneumonia 07/20/12  . PONV (postoperative nausea and vomiting)   . Rheumatoid arthritis ([place])   . Sickle cell trait ([place])   . Sinus congestion 08/14/2018   C/O NASAL/CHEST  CONGESTION, COUGHING WITH PHLEGM, PER PATIENT , SHE IS TO BEGIN TAKING AUGMENTIN DOSE PACK TODAY;    . Strep throat at age 37   The patient reports a severe strep throat infection at age 36 and   is not clear as to whether or not she may have had rheumatic fever.  . Symptomatic premature ventricular contractions    improved s/p ablation  . Urgency of urination   . Uterine bleeding 2008   Uterine artery embolization in 2008 for uterine bleeding.    Family History  Problem Relation Age of Onset  . Hypertension Mother   . Diabetes Mother   . Uterine cancer Sister   . Prostate cancer Brother   . Prostate cancer Paternal Grandmother   . Colon cancer Paternal Grandfather   . Prostate cancer Brother   . Prostate cancer Brother   . Healthy Son    Past Surgical History:  Procedure Laterality Date  . COLONOSCOPY W/ POLYPECTOMY    . Diagnostic D&C hysteroscopy and Novasure ablation  03/17/2004  . DILATION AND CURETTAGE OF UTERUS  12/17/2011   Procedure: DILATATION AND CURETTAGE;  Surgeon: Frederico Hamman, MD;  Location: [facility];  Service: Gynecology;  Laterality: N/A;  With Attempted Hydrothermal Ablation  . LUMBAR LAMINECTOMY/DECOMPRESSION MICRODISCECTOMY Left 02/21/2013   Procedure: LUMBAR LAMINECTOMY/DECOMPRESSION MICRODISCECTOMY 1 LEVEL;  Surgeon: Winfield Cunas, MD;  Location: [facility];  Service: Neurosurgery;  Laterality: Left;  LEFT Lumbar Three-Four Laminotomy foraminotomy microdiskectomy  . MANDIBLE SURGERY      under bite  . PVC Ablation  2010  . ROBOTIC ASSISTED TOTAL HYSTERECTOMY WITH BILATERAL SALPINGO OOPHERECTOMY Bilateral 08/21/2018   Procedure: XI ROBOTIC ASSISTED TOTAL HYSTERECTOMY WITH BILATERAL SALPINGO OOPHORECTOMY;  Surgeon: Everitt Amber, MD;  Location: [facility];  Service: Gynecology;  Laterality: Bilateral;  . SPINE SURGERY    . TONSILLECTOMY    . UTERINE ARTERY EMBOLIZATION  2008   for uterine bleeding  . 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 Influenza,inj,Quad PF,6+ Mos 06/14/2019  . PFIZER SARS-COV-2 Vaccination 11/08/2019, 11/30/2019, 08/19/2020  . Tdap 02/03/2015     Objective: Vital Signs: BP (!) 144/89 (BP Location: Right Arm, Patient Position: Sitting, Cuff Size: Normal)   Pulse 66   Resp 16   Ht 5\' 9"  (1.753 m)   Wt 277 lb (125.6 kg)   BMI 40.91 kg/m    Physical Exam Vitals and nursing note reviewed.  Constitutional:      Appearance: She is well-developed and well-nourished.  HENT:     Head: Normocephalic and atraumatic.  Eyes:  Extraocular Movements: EOM normal.     Conjunctiva/sclera: Conjunctivae normal.  Cardiovascular:     Pulses: Intact distal pulses.  Pulmonary:     Effort: Pulmonary effort is normal.  Abdominal:     Palpations: Abdomen is soft.  Musculoskeletal:     Cervical back: Normal range of motion.  Skin:    General: Skin is warm and dry.     Capillary Refill: Capillary refill takes less than 2 seconds.  Neurological:     Mental Status: She is alert and oriented to person, place, and time.  Psychiatric:        Mood and Affect: Mood and affect normal.        Behavior: Behavior normal.      Musculoskeletal Exam: C-spine, thoracic spine, and lumbar spine have good range of motion.  Painful range of motion of lumbar spine.  Shoulder joints, elbow joints, wrist joints,  MCPs, PIPs, DIPs have good range of motion with no synovitis.  Thickening and tenderness of the right first PIP joint noted.  Complete fist formation bilaterally.  No tenderness or synovitis of MCP joints.  Hip joints have good range of motion with no discomfort.  Tenderness over bilateral trochanteric bursa.  Knee joints have good range of motion with no warmth or effusion.  Ankle joints have good range of motion without tenderness or discomfort.   CDAI Exam: CDAI Score: 1  Patient Global: 8 mm; Provider Global: 2 mm Swollen: 0 ; Tender: 0  Joint Exam 09/08/2020   No joint exam has been documented for this visit   There is currently no information documented on the homunculus. Go to the Rheumatology activity and complete the homunculus joint exam.  Investigation: No additional findings.  Imaging: No results found.  Recent Labs: Lab Results  Component Value Date   WBC 5.6 07/16/2020   HGB 11.3 (L) 07/16/2020   PLT 246 07/16/2020   NA 139 07/16/2020   K 4.5 07/16/2020   CL 105 07/16/2020   CO2 25 07/16/2020   GLUCOSE 98 07/16/2020   BUN 21 07/16/2020   CREATININE 0.97 07/16/2020   BILITOT 0.4 07/16/2020   ALKPHOS 80 08/14/2018   AST 19 07/16/2020   ALT 12 07/16/2020   PROT 6.6 07/16/2020   ALBUMIN 4.3 08/14/2018   CALCIUM 9.5 07/16/2020   GFRAA 72 07/16/2020   QFTBGOLDPLUS NEGATIVE 01/15/2019    Speciality Comments: PLQ Eye Exam 01/25/19 WNL @ [facility]  Follow up in 6 months   Procedures:  No procedures performed Allergies: Methylprednisolone, Sulfa antibiotics, and Sulfonamide derivatives   Assessment / Plan:     Visit Diagnoses: Rheumatoid arthritis with rheumatoid factor of multiple sites without organ or systems involvement ([place]) - RF 74, CCP >250 ,Sed rate 45, ANA 1: 320 cytoplasmic, positive synovitis: She has no synovitis on exam.  She has not had any recent rheumatoid arthritis flares.  She has been experiencing increased pain in her right hand and  both knee joints due to underlying osteoarthritis.  Thickening and tenderness of the right first PIP joint was noted but no synovitis was noted on examination today.  We discussed the use of arthritis compression gloves, importance of performing hand exercises, and use of Voltaren gel as needed.  She has been taking turmeric on a daily basis and was encouraged to add the other natural anti-inflammatories as discussed today.  Overall her rheumatoid arthritis is well controlled on Plaquenil 200 mg 1 tablet by mouth twice daily.  She will continue  on the current treatment regimen.  She was advised to notify us if she develops increased joint pain or joint swelling.  She will follow-up in the office in 5 months.  High risk medication use - PLQ 200 mg BID-Started in May 2020.  Plaquenil eye exam was updated on 09/08/2020.  A single point of depression on visual field OD was noted.  Her ophthalmologist recommends following up in 6 months with Dr. Eulas Post to repeat VF testing.   CBC and CMP were updated on 07/16/2020.  Her next lab work will be due in April and every 5 months to monitor.   No recent infections.  She has occasional bouts of folliculitis.  Discussed the importance of using antimicrobial body wash.  Primary osteoarthritis of both hands: She has PIP and DIP thickening consistent with osteoarthritis of both hands.  Tenderness over the right first PIP joint noted.  She is able to make a complete fist bilaterally.  We discussed the importance of joint protection and muscle strengthening.  She was given a handout of hand exercises to perform.  We also discussed the use of arthritis compression gloves.  We also discussed the list of natural anti-inflammatories in detail.  Trochanteric bursitis of both hips: She presents today with discomfort in both hips.  She has good range of motion of both hip joints with no discomfort.  No groin pain at this time.  Her discomfort has been exacerbated by lying on her sides  at night.  She has tenderness palpation over bilateral trochanteric bursa on examination today.  Different treatment options were discussed in detail.  She declined physical therapy at this time.  She would like to try home exercises.  Handout of exercises was provided to the patient today in the office.  She was advised to notify us if her symptoms persist or worsen.  Primary osteoarthritis of both knees: She has good range of motion of both knee joints on exam.  No warmth or effusion was noted.  She was advised to avoid taking ibuprofen.  Discussed the use of topical agents and lower extremity strengthening.  Primary osteoarthritis of both feet: She is not experiencing any pain in her feet at this time.  She has good range of motion of both ankle joints with no discomfort.   Positive ANA (antinuclear antibody) - ENA, C3-C4, anticardiolipin, lupus anticoagulant, beta-2 GP 1-were all negative.  She has no other clinical features of autoimmune disease.   Other medical conditions are listed as follows:  History of DVT (deep vein thrombosis): She is taking xarelto.   PVC's (premature ventricular contractions)  History of pulmonary embolism  History of atrial fibrillation  Hyperlipidemia with target LDL less than 130  History of anemia  Essential hypertension  Orders: No orders of the defined types were placed in this encounter.  No orders of the defined types were placed in this encounter.     Follow-Up Instructions: Return in about 3 months (around 12/07/2020) for Rheumatoid arthritis, Osteoarthritis.   Ofilia Neas, PA-C  Note - This record has been created using Dragon software.  Chart creation errors have been sought, but may not always  have been located. Such creation errors do not reflect on  the standard of medical care.

## 2020-09-08 ENCOUNTER — Encounter: Payer: Self-pay | Admitting: Physician Assistant

## 2020-09-08 ENCOUNTER — Ambulatory Visit: Payer: 59 | Admitting: Physician Assistant

## 2020-09-08 ENCOUNTER — Other Ambulatory Visit: Payer: Self-pay

## 2020-09-08 VITALS — BP 144/89 | HR 66 | Resp 16 | Ht 69.0 in | Wt 277.0 lb

## 2020-09-08 DIAGNOSIS — H2513 Age-related nuclear cataract, bilateral: Secondary | ICD-10-CM | POA: Diagnosis not present

## 2020-09-08 DIAGNOSIS — Z8679 Personal history of other diseases of the circulatory system: Secondary | ICD-10-CM

## 2020-09-08 DIAGNOSIS — R768 Other specified abnormal immunological findings in serum: Secondary | ICD-10-CM

## 2020-09-08 DIAGNOSIS — M19041 Primary osteoarthritis, right hand: Secondary | ICD-10-CM

## 2020-09-08 DIAGNOSIS — M19071 Primary osteoarthritis, right ankle and foot: Secondary | ICD-10-CM | POA: Diagnosis not present

## 2020-09-08 DIAGNOSIS — Z79899 Other long term (current) drug therapy: Secondary | ICD-10-CM | POA: Diagnosis not present

## 2020-09-08 DIAGNOSIS — M7061 Trochanteric bursitis, right hip: Secondary | ICD-10-CM | POA: Diagnosis not present

## 2020-09-08 DIAGNOSIS — H35412 Lattice degeneration of retina, left eye: Secondary | ICD-10-CM | POA: Diagnosis not present

## 2020-09-08 DIAGNOSIS — M7062 Trochanteric bursitis, left hip: Secondary | ICD-10-CM

## 2020-09-08 DIAGNOSIS — I493 Ventricular premature depolarization: Secondary | ICD-10-CM

## 2020-09-08 DIAGNOSIS — Z86711 Personal history of pulmonary embolism: Secondary | ICD-10-CM

## 2020-09-08 DIAGNOSIS — M170 Bilateral primary osteoarthritis of knee: Secondary | ICD-10-CM | POA: Diagnosis not present

## 2020-09-08 DIAGNOSIS — Z86718 Personal history of other venous thrombosis and embolism: Secondary | ICD-10-CM | POA: Diagnosis not present

## 2020-09-08 DIAGNOSIS — I10 Essential (primary) hypertension: Secondary | ICD-10-CM

## 2020-09-08 DIAGNOSIS — E785 Hyperlipidemia, unspecified: Secondary | ICD-10-CM

## 2020-09-08 DIAGNOSIS — H10413 Chronic giant papillary conjunctivitis, bilateral: Secondary | ICD-10-CM | POA: Diagnosis not present

## 2020-09-08 DIAGNOSIS — M0579 Rheumatoid arthritis with rheumatoid factor of multiple sites without organ or systems involvement: Secondary | ICD-10-CM

## 2020-09-08 DIAGNOSIS — Z862 Personal history of diseases of the blood and blood-forming organs and certain disorders involving the immune mechanism: Secondary | ICD-10-CM

## 2020-09-08 DIAGNOSIS — M19042 Primary osteoarthritis, left hand: Secondary | ICD-10-CM

## 2020-09-08 DIAGNOSIS — M19072 Primary osteoarthritis, left ankle and foot: Secondary | ICD-10-CM

## 2020-09-08 NOTE — Patient Instructions (Addendum)
Hand Exercises Hand exercises can be helpful for almost anyone. These exercises can strengthen the hands, improve flexibility and movement, and increase blood flow to the hands. These results can make work and daily tasks easier. Hand exercises can be especially helpful for people who have joint pain from arthritis or have nerve damage from overuse (carpal tunnel syndrome). These exercises can also help people who have injured a hand. Exercises Most of these hand exercises are gentle stretching and motion exercises. It is usually safe to do them often throughout the day. Warming up your hands before exercise may help to reduce stiffness. You can do this with gentle massage or by placing your hands in warm water for 10-15 minutes. It is normal to feel some stretching, pulling, tightness, or mild discomfort as you begin new exercises. This will gradually improve. Stop an exercise right away if you feel sudden, severe pain or your pain gets worse. Ask your health care provider which exercises are best for you. Knuckle bend or "claw" fist 1. Stand or sit with your arm, hand, and all five fingers pointed straight up. Make sure to keep your wrist straight during the exercise. 2. Gently bend your fingers down toward your palm until the tips of your fingers are touching the top of your palm. Keep your big knuckle straight and just bend the small knuckles in your fingers. 3. Hold this position for __________ seconds. 4. Straighten (extend) your fingers back to the starting position. Repeat this exercise 5-10 times with each hand. Full finger fist 1. Stand or sit with your arm, hand, and all five fingers pointed straight up. Make sure to keep your wrist straight during the exercise. 2. Gently bend your fingers into your palm until the tips of your fingers are touching the middle of your palm. 3. Hold this position for __________ seconds. 4. Extend your fingers back to the starting position, stretching every  joint fully. Repeat this exercise 5-10 times with each hand. Straight fist 1. Stand or sit with your arm, hand, and all five fingers pointed straight up. Make sure to keep your wrist straight during the exercise. 2. Gently bend your fingers at the big knuckle, where your fingers meet your hand, and the middle knuckle. Keep the knuckle at the tips of your fingers straight and try to touch the bottom of your palm. 3. Hold this position for __________ seconds. 4. Extend your fingers back to the starting position, stretching every joint fully. Repeat this exercise 5-10 times with each hand. Tabletop 1. Stand or sit with your arm, hand, and all five fingers pointed straight up. Make sure to keep your wrist straight during the exercise. 2. Gently bend your fingers at the big knuckle, where your fingers meet your hand, as far down as you can while keeping the small knuckles in your fingers straight. Think of forming a tabletop with your fingers. 3. Hold this position for __________ seconds. 4. Extend your fingers back to the starting position, stretching every joint fully. Repeat this exercise 5-10 times with each hand. Finger spread 1. Place your hand flat on a table with your palm facing down. Make sure your wrist stays straight as you do this exercise. 2. Spread your fingers and thumb apart from each other as far as you can until you feel a gentle stretch. Hold this position for __________ seconds. 3. Bring your fingers and thumb tight together again. Hold this position for __________ seconds. Repeat this exercise 5-10 times with each hand.   Making circles 1. Stand or sit with your arm, hand, and all five fingers pointed straight up. Make sure to keep your wrist straight during the exercise. 2. Make a circle by touching the tip of your thumb to the tip of your index finger. 3. Hold for __________ seconds. Then open your hand wide. 4. Repeat this motion with your thumb and each finger on your  hand. Repeat this exercise 5-10 times with each hand. Thumb motion 1. Sit with your forearm resting on a table and your wrist straight. Your thumb should be facing up toward the ceiling. Keep your fingers relaxed as you move your thumb. 2. Lift your thumb up as high as you can toward the ceiling. Hold for __________ seconds. 3. Bend your thumb across your palm as far as you can, reaching the tip of your thumb for the small finger (pinkie) side of your palm. Hold for __________ seconds. Repeat this exercise 5-10 times with each hand. Grip strengthening 1. Hold a stress ball or other soft ball in the middle of your hand. 2. Slowly increase the pressure, squeezing the ball as much as you can without causing pain. Think of bringing the tips of your fingers into the middle of your palm. All of your finger joints should bend when doing this exercise. 3. Hold your squeeze for __________ seconds, then relax. Repeat this exercise 5-10 times with each hand.   Contact a health care provider if:  Your hand pain or discomfort gets much worse when you do an exercise.  Your hand pain or discomfort does not improve within 2 hours after you exercise. If you have any of these problems, stop doing these exercises right away. Do not do them again unless your health care provider says that you can. Get help right away if:  You develop sudden, severe hand pain or swelling. If this happens, stop doing these exercises right away. Do not do them again unless your health care provider says that you can. This information is not intended to replace advice given to you by your health care provider. Make sure you discuss any questions you have with your health care provider. Document Revised: 12/07/2018 Document Reviewed: 08/17/2018 Elsevier Patient Education  2021 [employer].     Hip Bursitis Rehab Ask your health care provider which exercises are safe for you. Do exercises exactly as told by your health care  provider and adjust them as directed. It is normal to feel mild stretching, pulling, tightness, or discomfort as you do these exercises. Stop right away if you feel sudden pain or your pain gets worse. Do not begin these exercises until told by your health care provider. Stretching exercise This exercise warms up your muscles and joints and improves the movement and flexibility of your hip. This exercise also helps to relieve pain and stiffness. Iliotibial band stretch An iliotibial band is a strong band of muscle tissue that runs from the outer side of your hip to the outer side of your thigh and knee. 1. Lie on your side with your left / right leg in the top position. 2. Bend your left / right knee and grab your ankle. Stretch out your bottom arm to help you balance. 3. Slowly bring your knee back so your thigh is behind your body. 4. Slowly lower your knee toward the floor until you feel a gentle stretch on the outside of your left / right thigh. If you do not feel a stretch and your knee will  not fall farther, place the heel of your other foot on top of your knee and pull your knee down toward the floor with your foot. 5. Hold this position for __________ seconds. 6. Slowly return to the starting position. Repeat __________ times. Complete this exercise __________ times a day.   Strengthening exercises These exercises build strength and endurance in your hip and pelvis. Endurance is the ability to use your muscles for a long time, even after they get tired. Bridge This exercise strengthens the muscles that move your thigh backward (hip extensors). 1. Lie on your back on a firm surface with your knees bent and your feet flat on the floor. 2. Tighten your buttocks muscles and lift your buttocks off the floor until your trunk is level with your thighs. ? Do not arch your back. ? You should feel the muscles working in your buttocks and the back of your thighs. If you do not feel these muscles,  slide your feet 1-2 inches (2.5-5 cm) farther away from your buttocks. ? If this exercise is too easy, try doing it with your arms crossed over your chest. 3. Hold this position for __________ seconds. 4. Slowly lower your hips to the starting position. 5. Let your muscles relax completely after each repetition. Repeat __________ times. Complete this exercise __________ times a day.   Squats This exercise strengthens the muscles in front of your thigh and knee (quadriceps). 1. Stand in front of a table, with your feet and knees pointing straight ahead. You may rest your hands on the table for balance but not for support. 2. Slowly bend your knees and lower your hips like you are going to sit in a chair. ? Keep your weight over your heels, not over your toes. ? Keep your lower legs upright so they are parallel with the table legs. ? Do not let your hips go lower than your knees. ? Do not bend lower than told by your health care provider. ? If your hip pain increases, do not bend as low. 3. Hold the squat position for __________ seconds. 4. Slowly push with your legs to return to standing. Do not use your hands to pull yourself to standing. Repeat __________ times. Complete this exercise __________ times a day. Hip hike 1. Stand sideways on a bottom step. Stand on your left / right leg with your other foot unsupported next to the step. You can hold on to the railing or wall for balance if needed. 2. Keep your knees straight and your torso square. Then lift your left / right hip up toward the ceiling. 3. Hold this position for __________ seconds. 4. Slowly let your left / right hip lower toward the floor, past the starting position. Your foot should get closer to the floor. Do not lean or bend your knees. Repeat __________ times. Complete this exercise __________ times a day. Single leg stand 1. Without shoes, stand near a railing or in a doorway. You may hold on to the railing or door frame as  needed for balance. 2. Squeeze your left / right buttock muscles, then lift up your other foot. ? Do not let your left / right hip push out to the side. ? It is helpful to stand in front of a mirror for this exercise so you can watch your hip. 3. Hold this position for __________ seconds. Repeat __________ times. Complete this exercise __________ times a day. This information is not intended to replace advice given to you  by your health care provider. Make sure you discuss any questions you have with your health care provider. Document Revised: 12/11/2018 Document Reviewed: 12/11/2018 Elsevier Patient Education  [place] Band Syndrome Rehab Ask your health care provider which exercises are safe for you. Do exercises exactly as told by your health care provider and adjust them as directed. It is normal to feel mild stretching, pulling, tightness, or discomfort as you do these exercises. Stop right away if you feel sudden pain or your pain gets significantly worse. Do not begin these exercises until told by your health care provider. Stretching and range-of-motion exercises These exercises warm up your muscles and joints and improve the movement and flexibility of your hip and pelvis. Quadriceps stretch, prone 1. Lie on your abdomen (prone position) on a firm surface, such as a bed or padded floor. 2. Bend your left / right knee and reach back to hold your ankle or pant leg. If you cannot reach your ankle or pant leg, loop a belt around your foot and grab the belt instead. 3. Gently pull your heel toward your buttocks. Your knee should not slide out to the side. You should feel a stretch in the front of your thigh and knee (quadriceps). 4. Hold this position for __________ seconds. Repeat __________ times. Complete this exercise __________ times a day.   Iliotibial band stretch An iliotibial band is a strong band of muscle tissue that runs from the outer side of your hip  to the outer side of your thigh and knee. 1. Lie on your side with your left / right leg in the top position. 2. Bend both of your knees and grab your left / right ankle. Stretch out your bottom arm to help you balance. 3. Slowly bring your top knee back so your thigh goes behind your trunk. 4. Slowly lower your top leg toward the floor until you feel a gentle stretch on the outside of your left / right hip and thigh. If you do not feel a stretch and your knee will not fall farther, place the heel of your other foot on top of your knee and pull your knee down toward the floor with your foot. 5. Hold this position for __________ seconds. Repeat __________ times. Complete this exercise __________ times a day.   Strengthening exercises These exercises build strength and endurance in your hip and pelvis. Endurance is the ability to use your muscles for a long time, even after they get tired. Straight leg raises, side-lying This exercise strengthens the muscles that rotate the leg at the hip and move it away from your body (hip abductors). 1. Lie on your side with your left / right leg in the top position. Lie so your head, shoulder, hip, and knee line up. You may bend your bottom knee to help you balance. 2. Roll your hips slightly forward so your hips are stacked directly over each other and your left / right knee is facing forward. 3. Tense the muscles in your outer thigh and lift your top leg 4-6 inches (10-15 cm). 4. Hold this position for __________ seconds. 5. Slowly lower your leg to return to the starting position. Let your muscles relax completely before doing another repetition. Repeat __________ times. Complete this exercise __________ times a day.   Leg raises, prone This exercise strengthens the muscles that move the hips backward (hip extensors). 1. Lie on your abdomen (prone position) on your bed or a firm  surface. You can put a pillow under your hips if that is more comfortable for  your lower back. 2. Bend your left / right knee so your foot is straight up in the air. 3. Squeeze your buttocks muscles and lift your left / right thigh off the bed. Do not let your back arch. 4. Tense your thigh muscle as hard as you can without increasing any knee pain. 5. Hold this position for __________ seconds. 6. Slowly lower your leg to return to the starting position and allow it to relax completely. Repeat __________ times. Complete this exercise __________ times a day. Hip hike 1. Stand sideways on a bottom step. Stand on your left / right leg with your other foot unsupported next to the step. You can hold on to a railing or wall for balance if needed. 2. Keep your knees straight and your torso square. Then lift your left / right hip up toward the ceiling. 3. Slowly let your left / right hip lower toward the floor, past the starting position. Your foot should get closer to the floor. Do not lean or bend your knees. Repeat __________ times. Complete this exercise __________ times a day. This information is not intended to replace advice given to you by your health care provider. Make sure you discuss any questions you have with your health care provider. Document Revised: 10/24/2019 Document Reviewed: 10/24/2019 Elsevier Patient Education  2021 [place] We placed an order today for your standing lab work.   Please have your standing labs drawn in April and every 5 months   If possible, please have your labs drawn 2 weeks prior to your appointment so that the provider can discuss your results at your appointment.  We have open lab daily Monday through Thursday from 8:30-12:30 PM and 1:30-4:30 PM and Friday from 8:30-12:30 PM and 1:30-4:00 PM at the office of Dr. Bo Merino, [facility].   Please be advised, patients with office appointments requiring lab work will take precedents over walk-in lab work.  If possible, please come for your  lab work on Monday and Friday afternoons, as you may experience shorter wait times. The office is located at [address] [place], [place], [place] [address] No appointment is necessary.   Labs are drawn by [facility]. Please bring your co-pay at the time of your lab draw.  You may receive a bill from [facility] for your lab work.  If you wish to have your labs drawn at another location, please call the office 24 hours in advance to send orders.  If you have any questions regarding directions or hours of operation,  please call [phone].   As a reminder, please drink plenty of water prior to coming for your lab work. Thanks!

## 2020-09-24 ENCOUNTER — Other Ambulatory Visit: Payer: Self-pay | Admitting: Student

## 2020-09-24 ENCOUNTER — Telehealth: Payer: Self-pay | Admitting: Internal Medicine

## 2020-09-24 ENCOUNTER — Other Ambulatory Visit: Payer: Self-pay | Admitting: Internal Medicine

## 2020-09-24 DIAGNOSIS — I480 Paroxysmal atrial fibrillation: Secondary | ICD-10-CM

## 2020-09-24 MED ORDER — RIVAROXABAN 20 MG PO TABS: ORAL_TABLET | ORAL | 1 refills | Status: DC

## 2020-09-24 MED FILL — XARELTO 20 MG TABLET: 20 | 30 days supply | Qty: 30 | Fill #0

## 2020-09-24 NOTE — Telephone Encounter (Signed)
°*  STAT* If patient is at the pharmacy, call can be transferred to refill team.   1. Which medications need to be refilled? (please list name of each medication and dose if known) Xarellto  2. Which pharmacy/location (including street and city if local pharmacy) is medication to be sent to? [facility]  3. Dohey need a 30 day or 90 day supply? 90 days and refills

## 2020-09-24 NOTE — Telephone Encounter (Signed)
Pt's age 65, wt 125.6 kg, SCr 0.97, CrCl 116.18, last ov w/ JA 06/30/20. Xarelto refill sent in as requested.

## 2020-09-25 ENCOUNTER — Telehealth: Payer: Self-pay | Admitting: Internal Medicine

## 2020-09-25 DIAGNOSIS — I480 Paroxysmal atrial fibrillation: Secondary | ICD-10-CM

## 2020-09-25 NOTE — Telephone Encounter (Signed)
Pt c/o medication issue:  1. Name of Medication: rivaroxaban (XARELTO) 20 MG TABS tablet    2. How are you currently taking this medication (dosage and times per day)? As prescribed.  3. Are you having a reaction (difficulty breathing--STAT)? No.  4. What is your medication issue? Patient is calling stating that when she picked up this medication today she noticed it had no refills left and it also only had a 30 day supply instead of the 90 that she's use to getting. Please advise.

## 2020-09-26 ENCOUNTER — Other Ambulatory Visit: Payer: Self-pay | Admitting: Internal Medicine

## 2020-09-26 MED ORDER — RIVAROXABAN 20 MG PO TABS: ORAL_TABLET | ORAL | 1 refills | Status: DC

## 2020-09-26 NOTE — Telephone Encounter (Signed)
Refill was sent in for #90 w/ 1 refill on 08/2419. Will resubmit to pharmacy.

## 2020-10-28 MED FILL — NADOLOL 20 MG TABS: 20 | 90 days supply | Qty: 90 | Fill #1

## 2020-10-28 MED FILL — XARELTO 20 MG TABLET: 20 | 90 days supply | Qty: 90 | Fill #0

## 2020-11-13 ENCOUNTER — Other Ambulatory Visit: Payer: Self-pay

## 2020-11-13 ENCOUNTER — Other Ambulatory Visit: Payer: Self-pay | Admitting: Physician Assistant

## 2020-11-13 ENCOUNTER — Encounter: Payer: Self-pay | Admitting: Physician Assistant

## 2020-11-13 ENCOUNTER — Telehealth: Payer: Self-pay

## 2020-11-13 ENCOUNTER — Ambulatory Visit: Payer: 59 | Admitting: Physician Assistant

## 2020-11-13 VITALS — BP 152/80 | HR 62 | Resp 17 | Ht 69.0 in | Wt 285.2 lb

## 2020-11-13 DIAGNOSIS — M19041 Primary osteoarthritis, right hand: Secondary | ICD-10-CM | POA: Diagnosis not present

## 2020-11-13 DIAGNOSIS — R7689 Other specified abnormal immunological findings in serum: Secondary | ICD-10-CM

## 2020-11-13 DIAGNOSIS — M19042 Primary osteoarthritis, left hand: Secondary | ICD-10-CM

## 2020-11-13 DIAGNOSIS — M0579 Rheumatoid arthritis with rheumatoid factor of multiple sites without organ or systems involvement: Secondary | ICD-10-CM

## 2020-11-13 DIAGNOSIS — R768 Other specified abnormal immunological findings in serum: Secondary | ICD-10-CM | POA: Diagnosis not present

## 2020-11-13 DIAGNOSIS — E785 Hyperlipidemia, unspecified: Secondary | ICD-10-CM

## 2020-11-13 DIAGNOSIS — M170 Bilateral primary osteoarthritis of knee: Secondary | ICD-10-CM | POA: Diagnosis not present

## 2020-11-13 DIAGNOSIS — M19072 Primary osteoarthritis, left ankle and foot: Secondary | ICD-10-CM

## 2020-11-13 DIAGNOSIS — M19071 Primary osteoarthritis, right ankle and foot: Secondary | ICD-10-CM | POA: Diagnosis not present

## 2020-11-13 DIAGNOSIS — Z86718 Personal history of other venous thrombosis and embolism: Secondary | ICD-10-CM

## 2020-11-13 DIAGNOSIS — Z862 Personal history of diseases of the blood and blood-forming organs and certain disorders involving the immune mechanism: Secondary | ICD-10-CM

## 2020-11-13 DIAGNOSIS — Z86711 Personal history of pulmonary embolism: Secondary | ICD-10-CM | POA: Diagnosis not present

## 2020-11-13 DIAGNOSIS — M7061 Trochanteric bursitis, right hip: Secondary | ICD-10-CM | POA: Diagnosis not present

## 2020-11-13 DIAGNOSIS — I493 Ventricular premature depolarization: Secondary | ICD-10-CM

## 2020-11-13 DIAGNOSIS — Z79899 Other long term (current) drug therapy: Secondary | ICD-10-CM

## 2020-11-13 DIAGNOSIS — M7062 Trochanteric bursitis, left hip: Secondary | ICD-10-CM

## 2020-11-13 DIAGNOSIS — I10 Essential (primary) hypertension: Secondary | ICD-10-CM

## 2020-11-13 DIAGNOSIS — Z8679 Personal history of other diseases of the circulatory system: Secondary | ICD-10-CM

## 2020-11-13 LAB — CBC WITH DIFFERENTIAL/PLATELET
Absolute Monocytes: 504 cells/uL (ref 200–950)
Basophils Absolute: 28 cells/uL (ref 0–200)
Basophils Relative: 0.5 %
Eosinophils Absolute: 129 cells/uL (ref 15–500)
Eosinophils Relative: 2.3 %
HCT: 36.7 % (ref 35.0–45.0)
Hemoglobin: 11.8 g/dL (ref 11.7–15.5)
Lymphs Abs: 1282 cells/uL (ref 850–3900)
MCH: 26.7 pg — ABNORMAL LOW (ref 27.0–33.0)
MCHC: 32.2 g/dL (ref 32.0–36.0)
MCV: 83 fL (ref 80.0–100.0)
MPV: 10.1 fL (ref 7.5–12.5)
Monocytes Relative: 9 %
Neutro Abs: 3657 cells/uL (ref 1500–7800)
Neutrophils Relative %: 65.3 %
Platelets: 220 10*3/uL (ref 140–400)
RBC: 4.42 10*6/uL (ref 3.80–5.10)
RDW: 12.9 % (ref 11.0–15.0)
Total Lymphocyte: 22.9 %
WBC: 5.6 10*3/uL (ref 3.8–10.8)

## 2020-11-13 LAB — COMPLETE METABOLIC PANEL WITH GFR
AG Ratio: 1.6 (calc) (ref 1.0–2.5)
ALT: 10 U/L (ref 6–29)
AST: 13 U/L (ref 10–35)
Albumin: 4.1 g/dL (ref 3.6–5.1)
Alkaline phosphatase (APISO): 74 U/L (ref 37–153)
BUN: 16 mg/dL (ref 7–25)
CO2: 28 mmol/L (ref 20–32)
Calcium: 9 mg/dL (ref 8.6–10.4)
Chloride: 106 mmol/L (ref 98–110)
Creat: 0.97 mg/dL (ref 0.50–0.99)
GFR, Est African American: 72 mL/min/{1.73_m2} (ref >=60)
GFR, Est Non African American: 62 mL/min/{1.73_m2} (ref >=60)
Globulin: 2.6 g/dL (calc) (ref 1.9–3.7)
Glucose, Bld: 94 mg/dL (ref 65–99)
Potassium: 4.5 mmol/L (ref 3.5–5.3)
Sodium: 140 mmol/L (ref 135–146)
Total Bilirubin: 0.4 mg/dL (ref 0.2–1.2)
Total Protein: 6.7 g/dL (ref 6.1–8.1)

## 2020-11-13 MED ORDER — PREDNISONE 5 MG PO TABS: ORAL_TABLET | ORAL | 0 refills | Status: DC

## 2020-11-13 NOTE — Telephone Encounter (Signed)
I called patient, appt 11/13/2020 at 10:40

## 2020-11-13 NOTE — Telephone Encounter (Signed)
Patient left a voicemail stating her hands are swollen and is not able to bend one of her fingers.  Patient requested a return call with suggestions on what to do.

## 2020-11-13 NOTE — Progress Notes (Signed)
Office Visit Note  Patient: Pamela Davenport             Date of Birth: 03/24/1956           MRN: [id number]             PCP: Janith Lima, MD Referring: Janith Lima, MD Visit Date: 11/13/2020 Occupation: @GUAROCC @  Subjective:  Pain in both hands   History of Present Illness: Pamela Davenport is a 65 y.o. female with history of seropositive rheumatoid arthritis and osteoarthritis.  Patient is currently taking Plaquenil 200 mg 1 tablet by mouth twice daily.  She continues to take omega-3 and tart cherry on a daily basis.  She states she started to have a flare earlier this week in her right middle finger and left index finger. She denies any overuse activities prior to the onset of symptoms. She states the pain and swelling has progressively been worsening.  She has not missed any doses of plaquenil recently.  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  She continues to have stiffness in both knee joints in the morning as well as difficulty with stairs. She denies any other joint pain or joint swelling.     Activities of Daily Living:  Patient reports morning stiffness for all day. Patient Denies nocturnal pain.  Difficulty dressing/grooming: Denies Difficulty climbing stairs: Denies Difficulty getting out of chair: Denies Difficulty using hands for taps, buttons, cutlery, and/or writing: Reports  Review of Systems  Constitutional: Positive for fatigue.  HENT: Negative for mouth sores, mouth dryness and nose dryness.   Eyes: Negative for pain, itching and dryness.  Respiratory: Negative for shortness of breath and difficulty breathing.   Cardiovascular: Negative for chest pain and palpitations.  Gastrointestinal: Negative for blood in stool, constipation and diarrhea.  Endocrine: Positive for increased urination.  Genitourinary: Negative for difficulty urinating and painful urination.   Musculoskeletal: Positive for arthralgias, joint pain, joint swelling and morning stiffness. Negative for myalgias, muscle tenderness and myalgias.  Skin: Negative for color change, rash and redness.  Allergic/Immunologic: Negative for susceptible to infections.  Neurological: Positive for numbness. Negative for dizziness, headaches, memory loss and weakness.  Hematological: Negative for bruising/bleeding tendency.  Psychiatric/Behavioral: Negative for confusion.    PMFS History:  Patient Active Problem List   Diagnosis Date Noted  . GERD without esophagitis 10/04/2019  . Vitamin D deficiency disease 09/25/2019  . Hypercoagulable state ([place]) 09/24/2019  . Iron deficiency anemia 12/07/2018  . Other dietary vitamin B12 deficiency anemia 12/07/2018  . Fibroid uterus 08/21/2018  . De Quervain's tenosynovitis 03/01/2018  . Visit for screening mammogram 04/18/2017  . Cervical cancer screening 04/18/2017  . Hyperlipidemia with target LDL less than 130 10/19/2016  . Routine general medical examination at a health care facility 02/03/2015  . Essential hypertension 01/13/2015  . Chronic pulmonary embolism ([place]) 12/05/2008  . ATRIAL FIBRILLATION 12/05/2008  . PVC's (premature ventricular contractions) 12/05/2008  . DVT 12/05/2008    Past Medical History:  Diagnosis Date  . Atypical chest pain    normal myoview 11/11/10  . Blood transfusion without reported diagnosis   . DVT (deep venous thrombosis) (HCC)    recurrent; chronically anticoagulated with coumadin  . Family history of anesthesia complication    Sister had decrease in respirations after surgery  . Heart murmur   . Herniated lumbar intervertebral disc   . Hypertension   .  Paroxysmal atrial fibrillation (HCC)    SINCE AGE 54   . PE (pulmonary embolism)    chronically anticoagulated with coumadin  . Pneumonia 07/20/12  . PONV (postoperative nausea and vomiting)   . Rheumatoid arthritis ([place])   . Sickle cell trait ([place])    . Sinus congestion 08/14/2018   C/O NASAL/CHEST  CONGESTION, COUGHING WITH PHLEGM, PER PATIENT , SHE IS TO BEGIN TAKING AUGMENTIN DOSE PACK TODAY;    . Strep throat at age 30   The patient reports a severe strep throat infection at age 22 and   is not clear as to whether or not she may have had rheumatic fever.  . Symptomatic premature ventricular contractions    improved s/p ablation  . Urgency of urination   . Uterine bleeding 2008   Uterine artery embolization in 2008 for uterine bleeding.    Family History  Problem Relation Age of Onset  . Hypertension Mother   . Diabetes Mother   . Uterine cancer Sister   . Prostate cancer Brother   . Prostate cancer Paternal Grandmother   . Colon cancer Paternal Grandfather   . Prostate cancer Brother   . Prostate cancer Brother   . Healthy Son    Past Surgical History:  Procedure Laterality Date  . COLONOSCOPY W/ POLYPECTOMY    . Diagnostic D&C hysteroscopy and Novasure ablation  03/17/2004  . DILATION AND CURETTAGE OF UTERUS  12/17/2011   Procedure: DILATATION AND CURETTAGE;  Surgeon: Frederico Hamman, MD;  Location: [facility];  Service: Gynecology;  Laterality: N/A;  With Attempted Hydrothermal Ablation  . LUMBAR LAMINECTOMY/DECOMPRESSION MICRODISCECTOMY Left 02/21/2013   Procedure: LUMBAR LAMINECTOMY/DECOMPRESSION MICRODISCECTOMY 1 LEVEL;  Surgeon: Winfield Cunas, MD;  Location: [facility];  Service: Neurosurgery;  Laterality: Left;  LEFT Lumbar Three-Four Laminotomy foraminotomy microdiskectomy  . MANDIBLE SURGERY     under bite  . PVC Ablation  2010  . ROBOTIC ASSISTED TOTAL HYSTERECTOMY WITH BILATERAL SALPINGO OOPHERECTOMY Bilateral 08/21/2018   Procedure: XI ROBOTIC ASSISTED TOTAL HYSTERECTOMY WITH BILATERAL SALPINGO OOPHORECTOMY;  Surgeon: Everitt Amber, MD;  Location: [facility];  Service: Gynecology;  Laterality: Bilateral;  . SPINE SURGERY    . TONSILLECTOMY    . UTERINE ARTERY EMBOLIZATION  2008   for uterine  bleeding  . 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 Influenza,inj,Quad PF,6+ Mos 06/14/2019  . PFIZER(Purple Top)SARS-COV-2 Vaccination 11/08/2019, 11/30/2019, 08/19/2020  . Tdap 02/03/2015     Objective: Vital Signs: BP (!) 152/80 (BP Location: Left Arm, Patient Position: Sitting, Cuff Size: Normal)   Pulse 62   Resp 17   Ht 5\' 9"  (1.753 m)   Wt 285 lb 3.2 oz (129.4 kg)   BMI 42.12 kg/m    Physical Exam Vitals and nursing note reviewed.  Constitutional:      Appearance: She is well-developed.  HENT:     Head: Normocephalic and atraumatic.  Eyes:     Conjunctiva/sclera: Conjunctivae normal.  Pulmonary:     Effort: Pulmonary effort is normal.  Abdominal:     Palpations: Abdomen is soft.  Musculoskeletal:     Cervical back: Normal range of motion.  Skin:    General: Skin is warm and dry.     Capillary Refill: Capillary refill takes less than 2 seconds.  Neurological:  Mental Status: She is alert and oriented to person, place, and time.  Psychiatric:        Behavior: Behavior normal.      Musculoskeletal Exam: C-spine, thoracic spine, and lumbar spine good ROM.  No midline spinal tenderness or SI joint tenderness.  Shoulder joints, elbow joints, and wrist joints have good range of motion with no discomfort.  Tenderness and synovitis over the right third MCP.  Tenderness over the right third PIP joint.  Tenderness and synovitis over the left second MCP and PIP joint.  Incomplete left fist formation.  Hip joints have good range of motion with no discomfort.  Knee joints have good range of motion with no warmth or effusion.  Ankle joints have good range of motion with no tenderness or joint swelling.  CDAI Exam: CDAI Score: 8.4  Patient Global: 8 mm; Provider Global: 6 mm Swollen: 3 ; Tender: 4  Joint Exam 11/13/2020       Right  Left  MCP 2     Swollen Tender  MCP 3  Swollen Tender     PIP 2     Swollen Tender  PIP 3   Tender        Investigation: No additional findings.  Imaging: No results found.  Recent Labs: Lab Results  Component Value Date   WBC 5.6 07/16/2020   HGB 11.3 (L) 07/16/2020   PLT 246 07/16/2020   NA 139 07/16/2020   K 4.5 07/16/2020   CL 105 07/16/2020   CO2 25 07/16/2020   GLUCOSE 98 07/16/2020   BUN 21 07/16/2020   CREATININE 0.97 07/16/2020   BILITOT 0.4 07/16/2020   ALKPHOS 80 08/14/2018   AST 19 07/16/2020   ALT 12 07/16/2020   PROT 6.6 07/16/2020   ALBUMIN 4.3 08/14/2018   CALCIUM 9.5 07/16/2020   GFRAA 72 07/16/2020   QFTBGOLDPLUS NEGATIVE 01/15/2019    Speciality Comments: PLQ Eye Exam 09/08/2020 WNL @ [facility]  Follow up in 6 months   Procedures:  No procedures performed Allergies: Methylprednisolone, Sulfa antibiotics, and Sulfonamide derivatives   Assessment / Plan:     Visit Diagnoses: Rheumatoid arthritis with rheumatoid factor of multiple sites without organ or systems involvement (HCC) - RF 74, CCP >250 ,Sed rate 45, ANA 1: 320 cytoplasmic, positive synovitis: She has tenderness and synovitis over the right third MCP and left second MCP and PIP joint.  She has been experiencing increased pain, stiffness, and swelling in both hands for the past 4 days.  She did not perform any overuse activities prior to the onset of symptoms.  She is not experiencing any other joint pain or inflammation at this time.  She has not had any other rheumatoid arthritis flares recently.  She has been taking Plaquenil 200 mg 1 tablet by mouth twice daily.  She is tolerating Plaquenil without any side effects.  She has not missed any doses of Plaquenil recently.  She is unable to take NSAIDs due to long-term use of Xarelto.  She has been taking her cherry and omega-3 on a daily basis.  A prednisone taper starting at 20 mg tapering by 5 mg every 4 days was sent to the  pharmacy.  In the past she has developed A. fib while on high doses of steriods while taking a Medrol Dosepak, but she tolerates lower doses of prednisone.  Advised to monitor her symptoms closely and to notify us and her cardiologist if she develops Afib.  She will continue on  Plaquenil as prescribed.  She was advised to notify us if she develops recurrent flares at which time we would discuss combination therapy.  She will be following up with Dr. Estanislado Pandy in 1 month.   High risk medication use - Plaquenil 200 mg 1 tablet by mouth twice daily-Started in May 2020. PLQ Eye Exam 09/08/2020.  CBC and CMP were drawn on 07/16/2020.  Orders for CBC and CMP were released today.- Plan: COMPLETE METABOLIC PANEL WITH GFR, CBC with Differential/Platelet She has not had any recent infections.  Primary osteoarthritis of both hands: She has PIP and DIP thickening consistent with osteoarthritis of both hands.  Joint protection and muscle strengthening were discussed.  The use of arthritis compression gloves was discussed.  She has encouraged her to continue to take natural anti-inflammatories.   Trochanteric bursitis of both hips: She has good range of motion of both hip joints with no discomfort.  No tenderness palpation over trochanteric bursa on examination today.  Primary osteoarthritis of both knees: She has good range of motion of both knee joints with no discomfort.  No warmth or effusion was noted.  She experiences stiffness in both knees first thing in the morning.  She continues to have difficulty climbing steps.  Primary osteoarthritis of both feet: She is not experiencing any discomfort in her feet at this time.  She is wearing proper fitting shoes.  Good range of motion of both ankle joints with no joint tenderness.  Pedal edema noted bilaterally.  Positive ANA (antinuclear antibody) - ENA, C3-C4, anticardiolipin, lupus anticoagulant, beta-2 GP 1-were all negative.  She has no other clinical features of  autoimmune disease.   Other medical conditions are listed as follows:   History of pulmonary embolism  History of DVT (deep vein thrombosis)  History of anemia  Hyperlipidemia with target LDL less than 130  PVC's (premature ventricular contractions)  History of atrial fibrillation  Essential hypertension  Orders: Orders Placed This Encounter  Procedures  . COMPLETE METABOLIC PANEL WITH GFR  . CBC with Differential/Platelet   Meds ordered this encounter  Medications  . predniSONE (DELTASONE) 5 MG tablet    Sig: Take 4 tablets by mouth daily x4 days, 3 tablets by mouth daily x4 days, 2 tablets by mouth daily x4 days, 1 tablet by mouth daily x4 days.    Dispense:  40 tablet    Refill:  0    Follow-Up Instructions: Return for Rheumatoid arthritis, Osteoarthritis.   Ofilia Neas, PA-C  Note - This record has been created using Dragon software.  Chart creation errors have been sought, but may not always  have been located. Such creation errors do not reflect on  the standard of medical care.

## 2020-11-14 NOTE — Progress Notes (Signed)
CBC and CMP WNL

## 2020-12-02 ENCOUNTER — Other Ambulatory Visit (HOSPITAL_COMMUNITY): Payer: Self-pay

## 2020-12-08 NOTE — Progress Notes (Signed)
Office Visit Note  Patient: Pamela Davenport             Date of Birth: [birth year]           MRN: [id number]             PCP: Janith Lima, MD Referring: Janith Lima, MD Visit Date: 12/22/2020 Occupation: @GUAROCC @  Subjective:  Other (Patient reports fall approximately 1.5 weeks ago and is experiencing left knee pain )   History of Present Illness: Pamela Davenport is a 65 y.o. female with history of rheumatoid arthritis and osteoarthritis.  She was seen here on November 13, 2020 by Lovena Le.  Patient states at that time she was having increased pain and swelling in her hands.  She was given a prednisone taper and the swelling subsided.  She states that she does not remember to take the second dose of Plaquenil.  She has not had intermittent swelling in her hands.  She fell a week and a half ago and hurt her left knee which is still somewhat sore.  Trochanteric bursitis is better.  She has off-and-on discomfort at nighttime.  Activities of Daily Living:  Patient reports morning stiffness for 20-30 minutes.   Patient Denies nocturnal pain.  Difficulty dressing/grooming: Denies Difficulty climbing stairs: Denies Difficulty getting out of chair: Denies Difficulty using hands for taps, buttons, cutlery, and/or writing: Reports  Review of Systems  Constitutional: Positive for fatigue. Negative for night sweats, weight gain and weight loss.  HENT: Negative for mouth sores, trouble swallowing, trouble swallowing, mouth dryness and nose dryness.   Eyes: Negative for pain, redness, itching, visual disturbance and dryness.  Respiratory: Negative for cough, shortness of breath and difficulty breathing.   Cardiovascular: Negative for chest pain, palpitations, hypertension, irregular heartbeat and swelling in legs/feet.  Gastrointestinal: Negative for blood in stool, constipation and diarrhea.  Endocrine: Negative for increased urination.  Genitourinary: Negative for difficulty urinating and  vaginal dryness.  Musculoskeletal: Positive for arthralgias, joint pain and morning stiffness. Negative for joint swelling, myalgias, muscle weakness, muscle tenderness and myalgias.  Skin: Negative for color change, rash, hair loss, redness, skin tightness, ulcers and sensitivity to sunlight.  Allergic/Immunologic: Negative for susceptible to infections.  Neurological: Positive for weakness. Negative for dizziness, numbness, headaches, memory loss and night sweats.  Hematological: Negative for bruising/bleeding tendency and swollen glands.  Psychiatric/Behavioral: Negative for depressed mood, confusion and sleep disturbance. The patient is not nervous/anxious.     PMFS History:  Patient Active Problem List   Diagnosis Date Noted  . GERD without esophagitis 10/04/2019  . Vitamin D deficiency disease 09/25/2019  . Hypercoagulable state ([place]) 09/24/2019  . Iron deficiency anemia 12/07/2018  . Other dietary vitamin B12 deficiency anemia 12/07/2018  . Fibroid uterus 08/21/2018  . De Quervain's tenosynovitis 03/01/2018  . Visit for screening mammogram 04/18/2017  . Cervical cancer screening 04/18/2017  . Hyperlipidemia with target LDL less than 130 10/19/2016  . Routine general medical examination at a health care facility 02/03/2015  . Essential hypertension 01/13/2015  . Chronic pulmonary embolism ([place]) 12/05/2008  . ATRIAL FIBRILLATION 12/05/2008  . PVC's (premature ventricular contractions) 12/05/2008  . DVT 12/05/2008    Past Medical History:  Diagnosis Date  . Atypical chest pain    normal myoview 11/11/10  . Blood transfusion without reported diagnosis   . DVT (deep venous thrombosis) (HCC)    recurrent; chronically anticoagulated with coumadin  . Family history of anesthesia complication    Sister had  decrease in respirations after surgery  . Heart murmur   . Herniated lumbar intervertebral disc   . Hypertension   . Paroxysmal atrial fibrillation (HCC)    SINCE AGE 43   .  PE (pulmonary embolism)    chronically anticoagulated with coumadin  . Pneumonia 07/20/12  . PONV (postoperative nausea and vomiting)   . Rheumatoid arthritis ([place])   . Sickle cell trait ([place])   . Sinus congestion 08/14/2018   C/O NASAL/CHEST  CONGESTION, COUGHING WITH PHLEGM, PER PATIENT , SHE IS TO BEGIN TAKING AUGMENTIN DOSE PACK TODAY;    . Strep throat at age 49   The patient reports a severe strep throat infection at age 86 and   is not clear as to whether or not she may have had rheumatic fever.  . Symptomatic premature ventricular contractions    improved s/p ablation  . Urgency of urination   . Uterine bleeding 2008   Uterine artery embolization in 2008 for uterine bleeding.    Family History  Problem Relation Age of Onset  . Hypertension Mother   . Diabetes Mother   . Uterine cancer Sister   . Prostate cancer Brother   . Prostate cancer Paternal Grandmother   . Colon cancer Paternal Grandfather   . Prostate cancer Brother   . Prostate cancer Brother   . Healthy Son    Past Surgical History:  Procedure Laterality Date  . COLONOSCOPY W/ POLYPECTOMY    . Diagnostic D&C hysteroscopy and Novasure ablation  03/17/2004  . DILATION AND CURETTAGE OF UTERUS  12/17/2011   Procedure: DILATATION AND CURETTAGE;  Surgeon: Frederico Hamman, MD;  Location: [facility];  Service: Gynecology;  Laterality: N/A;  With Attempted Hydrothermal Ablation  . LUMBAR LAMINECTOMY/DECOMPRESSION MICRODISCECTOMY Left 02/21/2013   Procedure: LUMBAR LAMINECTOMY/DECOMPRESSION MICRODISCECTOMY 1 LEVEL;  Surgeon: Winfield Cunas, MD;  Location: [facility];  Service: Neurosurgery;  Laterality: Left;  LEFT Lumbar Three-Four Laminotomy foraminotomy microdiskectomy  . MANDIBLE SURGERY     under bite  . PVC Ablation  2010  . ROBOTIC ASSISTED TOTAL HYSTERECTOMY WITH BILATERAL SALPINGO OOPHERECTOMY Bilateral 08/21/2018   Procedure: XI ROBOTIC ASSISTED TOTAL HYSTERECTOMY WITH BILATERAL SALPINGO OOPHORECTOMY;   Surgeon: Everitt Amber, MD;  Location: [facility];  Service: Gynecology;  Laterality: Bilateral;  . SPINE SURGERY    . TONSILLECTOMY    . UTERINE ARTERY EMBOLIZATION  2008   for uterine bleeding  . 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 Influenza,inj,Quad PF,6+ Mos 06/14/2019  . PFIZER(Purple Top)SARS-COV-2 Vaccination 11/08/2019, 11/30/2019, 08/19/2020  . Tdap 02/03/2015     Objective: Vital Signs: BP (!) 153/81 (BP Location: Left Arm, Patient Position: Sitting, Cuff Size: Large)   Pulse 76   Resp 16   Ht 5\' 9"  (1.753 m)   Wt 284 lb 6.4 oz (129 kg)   BMI 42.00 kg/m    Physical Exam Vitals and nursing note reviewed.  Constitutional:      Appearance: She is well-developed.  HENT:     Head: Normocephalic and atraumatic.  Eyes:     Conjunctiva/sclera: Conjunctivae normal.  Cardiovascular:     Rate and Rhythm: Normal rate and regular rhythm.     Heart sounds: Normal heart sounds.  Pulmonary:     Effort: Pulmonary effort is normal.     Breath  sounds: Normal breath sounds.  Abdominal:     General: Bowel sounds are normal.     Palpations: Abdomen is soft.  Musculoskeletal:     Cervical back: Normal range of motion.  Lymphadenopathy:     Cervical: No cervical adenopathy.  Skin:    General: Skin is warm and dry.     Capillary Refill: Capillary refill takes less than 2 seconds.  Neurological:     Mental Status: She is alert and oriented to person, place, and time.  Psychiatric:        Behavior: Behavior normal.      Musculoskeletal Exam: C-spine thoracic and lumbar spine with good range of motion.  Shoulder joints, elbow joints, wrist joints, MCPs PIPs and DIPs with good range of motion with no synovitis.  Hip joints, knee joints, ankles, MTPs and PIPs with good range of motion with no synovitis.  She had mild  tenderness on palpation of her left knee where she had injury.  No warmth swelling or effusion was noted.  CDAI Exam: CDAI Score: 0.6  Patient Global: 3 mm; Provider Global: 3 mm Swollen: 0 ; Tender: 0  Joint Exam 12/22/2020   No joint exam has been documented for this visit   There is currently no information documented on the homunculus. Go to the Rheumatology activity and complete the homunculus joint exam.  Investigation: No additional findings.  Imaging: No results found.  Recent Labs: Lab Results  Component Value Date   WBC 5.6 11/13/2020   HGB 11.8 11/13/2020   PLT 220 11/13/2020   NA 140 11/13/2020   K 4.5 11/13/2020   CL 106 11/13/2020   CO2 28 11/13/2020   GLUCOSE 94 11/13/2020   BUN 16 11/13/2020   CREATININE 0.97 11/13/2020   BILITOT 0.4 11/13/2020   ALKPHOS 80 08/14/2018   AST 13 11/13/2020   ALT 10 11/13/2020   PROT 6.7 11/13/2020   ALBUMIN 4.3 08/14/2018   CALCIUM 9.0 11/13/2020   GFRAA 72 11/13/2020   QFTBGOLDPLUS NEGATIVE 01/15/2019    Speciality Comments: PLQ Eye Exam 09/08/2020 WNL @ [facility]  Follow up in 6 months   Procedures:  No procedures performed Allergies: Methylprednisolone, Sulfa antibiotics, and Sulfonamide derivatives   Assessment / Plan:     Visit Diagnoses: Rheumatoid arthritis with rheumatoid factor of multiple sites without organ or systems involvement ([place]) - RF 74, CCP >250 ,Sed rate 45, ANA 1: 320 cytoplasmic, positive synovitis: Patient had a recent flare with increased pain and swelling in her hands for which she was given prednisone in March.  She is generally well without any joint pain or joint swelling now.  She states that she remembers to take Plaquenil once a day but the second dose she misses sometimes.  We discussed taking both Plaquenil tablets at 1 time.  We will see how she does over the next 3 months.  I also discussed possible use of methotrexate but she is hesitant to go on methotrexate.  We discussed  possible use of leflunomide at the follow-up visit if she does not have adequate response to hydroxychloroquine.  She will think about it.  High risk medication use - Plaquenil 200 mg 1 tablet by mouth twice daily-Started in May 2020. PLQ Eye Exam 09/08/2020.  Primary osteoarthritis of both hands-joint protection muscle strengthening was discussed.  Trochanteric bursitis of both hips-IT band stretches were emphasized.  Primary osteoarthritis of both knees-she continues to have some discomfort in her knee joints due to underlying osteoarthritis.  Injury of left knee initial encounter-she recently had a fall and landed on her left knee.  She has been having some discomfort over the left knee which has been improving gradually.  She had no warmth or swelling on my examination.  The pain is gradually improving.  Advised to ice the knee.  If her symptoms persist she should notify us.  Primary osteoarthritis of both feet-proper fitting shoes were discussed.  Positive ANA (antinuclear antibody) - ENA, C3-C4, anticardiolipin, lupus anticoagulant, beta-2 GP 1-were all negative.  She has no other clinical features of autoimmune disease.   History of pulmonary embolism-she is on Xarelto.  History of DVT (deep vein thrombosis)  Hyperlipidemia with target LDL less than 130  History of anemia  History of atrial fibrillation  PVC's (premature ventricular contractions)  Essential hypertension  Orders: No orders of the defined types were placed in this encounter.  No orders of the defined types were placed in this encounter.    Follow-Up Instructions: Return in about 3 months (around 03/23/2021) for Rheumatoid arthritis, Osteoarthritis.   Bo Merino, MD  Note - This record has been created using Editor, commissioning.  Chart creation errors have been sought, but may not always  have been located. Such creation errors do not reflect on  the standard of medical care.

## 2020-12-19 ENCOUNTER — Other Ambulatory Visit (HOSPITAL_COMMUNITY): Payer: Self-pay

## 2020-12-19 MED FILL — Rivaroxaban Tab 20 MG: ORAL | 90 days supply | Qty: 90 | Fill #0 | Status: CN

## 2020-12-19 MED FILL — Losartan Potassium Tab 50 MG: ORAL | 90 days supply | Qty: 90 | Fill #0 | Status: AC

## 2020-12-22 ENCOUNTER — Other Ambulatory Visit: Payer: Self-pay

## 2020-12-22 ENCOUNTER — Ambulatory Visit: Payer: 59 | Admitting: Rheumatology

## 2020-12-22 ENCOUNTER — Encounter: Payer: Self-pay | Admitting: Rheumatology

## 2020-12-22 VITALS — BP 153/81 | HR 76 | Resp 16 | Ht 69.0 in | Wt 284.4 lb

## 2020-12-22 DIAGNOSIS — M19041 Primary osteoarthritis, right hand: Secondary | ICD-10-CM | POA: Diagnosis not present

## 2020-12-22 DIAGNOSIS — M0579 Rheumatoid arthritis with rheumatoid factor of multiple sites without organ or systems involvement: Secondary | ICD-10-CM | POA: Diagnosis not present

## 2020-12-22 DIAGNOSIS — Z8679 Personal history of other diseases of the circulatory system: Secondary | ICD-10-CM

## 2020-12-22 DIAGNOSIS — M170 Bilateral primary osteoarthritis of knee: Secondary | ICD-10-CM | POA: Diagnosis not present

## 2020-12-22 DIAGNOSIS — M19071 Primary osteoarthritis, right ankle and foot: Secondary | ICD-10-CM

## 2020-12-22 DIAGNOSIS — Z86718 Personal history of other venous thrombosis and embolism: Secondary | ICD-10-CM | POA: Diagnosis not present

## 2020-12-22 DIAGNOSIS — I10 Essential (primary) hypertension: Secondary | ICD-10-CM

## 2020-12-22 DIAGNOSIS — Z86711 Personal history of pulmonary embolism: Secondary | ICD-10-CM

## 2020-12-22 DIAGNOSIS — Z79899 Other long term (current) drug therapy: Secondary | ICD-10-CM | POA: Diagnosis not present

## 2020-12-22 DIAGNOSIS — M7061 Trochanteric bursitis, right hip: Secondary | ICD-10-CM | POA: Diagnosis not present

## 2020-12-22 DIAGNOSIS — M19042 Primary osteoarthritis, left hand: Secondary | ICD-10-CM

## 2020-12-22 DIAGNOSIS — E785 Hyperlipidemia, unspecified: Secondary | ICD-10-CM

## 2020-12-22 DIAGNOSIS — R768 Other specified abnormal immunological findings in serum: Secondary | ICD-10-CM

## 2020-12-22 DIAGNOSIS — S8992XA Unspecified injury of left lower leg, initial encounter: Secondary | ICD-10-CM

## 2020-12-22 DIAGNOSIS — Z862 Personal history of diseases of the blood and blood-forming organs and certain disorders involving the immune mechanism: Secondary | ICD-10-CM

## 2020-12-22 DIAGNOSIS — M19072 Primary osteoarthritis, left ankle and foot: Secondary | ICD-10-CM

## 2020-12-22 DIAGNOSIS — M7062 Trochanteric bursitis, left hip: Secondary | ICD-10-CM

## 2020-12-22 DIAGNOSIS — I493 Ventricular premature depolarization: Secondary | ICD-10-CM

## 2020-12-22 NOTE — Patient Instructions (Signed)
Iliotibial Band Syndrome Rehab Ask your health care provider which exercises are safe for you. Do exercises exactly as told by your health care provider and adjust them as directed. It is normal to feel mild stretching, pulling, tightness, or discomfort as you do these exercises. Stop right away if you feel sudden pain or your pain gets significantly worse. Do not begin these exercises until told by your health care provider. Stretching and range-of-motion exercises These exercises warm up your muscles and joints and improve the movement and flexibility of your hip and pelvis. Quadriceps stretch, prone 1. Lie on your abdomen (prone position) on a firm surface, such as a bed or padded floor. 2. Bend your left / right knee and reach back to hold your ankle or pant leg. If you cannot reach your ankle or pant leg, loop a belt around your foot and grab the belt instead. 3. Gently pull your heel toward your buttocks. Your knee should not slide out to the side. You should feel a stretch in the front of your thigh and knee (quadriceps). 4. Hold this position for __________ seconds. Repeat __________ times. Complete this exercise __________ times a day.   Iliotibial band stretch An iliotibial band is a strong band of muscle tissue that runs from the outer side of your hip to the outer side of your thigh and knee. 1. Lie on your side with your left / right leg in the top position. 2. Bend both of your knees and grab your left / right ankle. Stretch out your bottom arm to help you balance. 3. Slowly bring your top knee back so your thigh goes behind your trunk. 4. Slowly lower your top leg toward the floor until you feel a gentle stretch on the outside of your left / right hip and thigh. If you do not feel a stretch and your knee will not fall farther, place the heel of your other foot on top of your knee and pull your knee down toward the floor with your foot. 5. Hold this position for __________  seconds. Repeat __________ times. Complete this exercise __________ times a day.   Strengthening exercises These exercises build strength and endurance in your hip and pelvis. Endurance is the ability to use your muscles for a long time, even after they get tired. Straight leg raises, side-lying This exercise strengthens the muscles that rotate the leg at the hip and move it away from your body (hip abductors). 1. Lie on your side with your left / right leg in the top position. Lie so your head, shoulder, hip, and knee line up. You may bend your bottom knee to help you balance. 2. Roll your hips slightly forward so your hips are stacked directly over each other and your left / right knee is facing forward. 3. Tense the muscles in your outer thigh and lift your top leg 4-6 inches (10-15 cm). 4. Hold this position for __________ seconds. 5. Slowly lower your leg to return to the starting position. Let your muscles relax completely before doing another repetition. Repeat __________ times. Complete this exercise __________ times a day.   Leg raises, prone This exercise strengthens the muscles that move the hips backward (hip extensors). 1. Lie on your abdomen (prone position) on your bed or a firm surface. You can put a pillow under your hips if that is more comfortable for your lower back. 2. Bend your left / right knee so your foot is straight up in the air.   3. Squeeze your buttocks muscles and lift your left / right thigh off the bed. Do not let your back arch. 4. Tense your thigh muscle as hard as you can without increasing any knee pain. 5. Hold this position for __________ seconds. 6. Slowly lower your leg to return to the starting position and allow it to relax completely. Repeat __________ times. Complete this exercise __________ times a day. Hip hike 1. Stand sideways on a bottom step. Stand on your left / right leg with your other foot unsupported next to the step. You can hold on to a  railing or wall for balance if needed. 2. Keep your knees straight and your torso square. Then lift your left / right hip up toward the ceiling. 3. Slowly let your left / right hip lower toward the floor, past the starting position. Your foot should get closer to the floor. Do not lean or bend your knees. Repeat __________ times. Complete this exercise __________ times a day. This information is not intended to replace advice given to you by your health care provider. Make sure you discuss any questions you have with your health care provider. Document Revised: 10/24/2019 Document Reviewed: 10/24/2019 Elsevier Patient Education  2021 [employer]  

## 2021-01-02 ENCOUNTER — Other Ambulatory Visit: Payer: Self-pay | Admitting: Physician Assistant

## 2021-01-02 ENCOUNTER — Telehealth: Payer: Self-pay

## 2021-01-02 ENCOUNTER — Other Ambulatory Visit (HOSPITAL_COMMUNITY): Payer: Self-pay

## 2021-01-02 DIAGNOSIS — M0579 Rheumatoid arthritis with rheumatoid factor of multiple sites without organ or systems involvement: Secondary | ICD-10-CM

## 2021-01-02 MED ORDER — HYDROXYCHLOROQUINE SULFATE 200 MG PO TABS
ORAL_TABLET | Freq: Two times a day (BID) | ORAL | 0 refills | Status: DC
  Filled 2021-01-02: qty 180, 90d supply, fill #0

## 2021-01-02 MED FILL — Rivaroxaban Tab 20 MG: ORAL | 90 days supply | Qty: 90 | Fill #0 | Status: AC

## 2021-01-02 NOTE — Telephone Encounter (Signed)
Patient left a voicemail requesting prescription refill of Plaquenil.  Patient states she is going out of town on Monday, 01/05/21.

## 2021-01-02 NOTE — Telephone Encounter (Signed)
Last Visit: 12/22/2020  Next Visit: 03/23/2021 Labs: 3/17/2022CBC and CMP WNL,   Eye exam: 09/08/2020  Current Dose per office note 12/22/2020, Plaquenil 200 mg 1 tablet by mouth twice daily  DX: Rheumatoid arthritis with rheumatoid factor of multiple sites without organ or systems involvement   Last Fill: 08/28/2020  Okay to refill Plaquenil?

## 2021-02-05 ENCOUNTER — Other Ambulatory Visit (HOSPITAL_COMMUNITY): Payer: Self-pay

## 2021-02-05 MED FILL — Nadolol Tab 20 MG: ORAL | 90 days supply | Qty: 90 | Fill #0 | Status: AC

## 2021-03-09 ENCOUNTER — Encounter: Payer: Self-pay | Admitting: Rheumatology

## 2021-03-09 DIAGNOSIS — Z79899 Other long term (current) drug therapy: Secondary | ICD-10-CM | POA: Diagnosis not present

## 2021-03-09 DIAGNOSIS — H04123 Dry eye syndrome of bilateral lacrimal glands: Secondary | ICD-10-CM | POA: Diagnosis not present

## 2021-03-09 DIAGNOSIS — H35412 Lattice degeneration of retina, left eye: Secondary | ICD-10-CM | POA: Diagnosis not present

## 2021-03-09 DIAGNOSIS — H2513 Age-related nuclear cataract, bilateral: Secondary | ICD-10-CM | POA: Diagnosis not present

## 2021-03-09 NOTE — Progress Notes (Signed)
Office Visit Note  Patient: Pamela Davenport             Date of Birth: 11-22-1955           MRN: [id number]             PCP: Pamela Lima, MD Referring: Pamela Lima, MD Visit Date: 03/23/2021 Occupation: @GUAROCC @  Subjective:  Right hand pain   History of Present Illness: Pamela Davenport is a 65 y.o. female with history of seropositive rheumatoid arthritis and osteoarthritis.  She is taking plaquenil 200 mg 1 tablet by mouth twice daily.  She continues to tolerate Plaquenil without any side effects and denies missing any doses recently.  Overall she feels as though Plaquenil has been managing her rheumatoid arthritis.  She has occasional pain and swelling in her hands which is typically exacerbated by overuse activities.  Several days ago she was experiencing pain and swelling in the right middle finger which has since resolved.  She has been taking turmeric, tart cherry, and omega-3 on a daily basis.    Activities of Daily Living:  Patient reports morning stiffness for 1-2 hours.   Patient Reports nocturnal pain.  Difficulty dressing/grooming: Denies Difficulty climbing stairs: Reports Difficulty getting out of chair: Reports Difficulty using hands for taps, buttons, cutlery, and/or writing: Denies  Review of Systems  Constitutional:  Positive for fatigue.  HENT:  Negative for mouth sores, mouth dryness and nose dryness.   Eyes:  Positive for dryness. Negative for pain and itching.  Respiratory:  Positive for shortness of breath. Negative for difficulty breathing.   Cardiovascular:  Negative for chest pain and palpitations.  Gastrointestinal:  Negative for blood in stool, constipation and diarrhea.  Endocrine: Negative for increased urination.  Genitourinary:  Negative for difficulty urinating.  Musculoskeletal:  Positive for morning stiffness. Negative for joint pain, joint pain, joint swelling, myalgias, muscle tenderness and myalgias.  Skin:  Negative for color  change, rash and redness.  Allergic/Immunologic: Negative for susceptible to infections.  Neurological:  Positive for headaches. Negative for dizziness, numbness and memory loss.  Hematological:  Negative for bruising/bleeding tendency.  Psychiatric/Behavioral:  Negative for confusion.    PMFS History:  Patient Active Problem List   Diagnosis Date Noted   GERD without esophagitis 10/04/2019   Vitamin D deficiency disease 09/25/2019   Hypercoagulable state (Pamela Davenport) 09/24/2019   Iron deficiency anemia 12/07/2018   Other dietary vitamin B12 deficiency anemia 12/07/2018   Fibroid uterus 08/21/2018   De Quervain's tenosynovitis 03/01/2018   Visit for screening mammogram 04/18/2017   Cervical cancer screening 04/18/2017   Hyperlipidemia with target LDL less than 130 10/19/2016   Routine general medical examination at a health care facility 02/03/2015   Essential hypertension 01/13/2015   Chronic pulmonary embolism (Pamela Davenport) 12/05/2008   ATRIAL FIBRILLATION 12/05/2008   PVC's (premature ventricular contractions) 12/05/2008   DVT 12/05/2008    Past Medical History:  Diagnosis Date   Atypical chest pain    normal myoview 11/11/10   Blood transfusion without reported diagnosis    DVT (deep venous thrombosis) (HCC)    recurrent; chronically anticoagulated with coumadin   Family history of anesthesia complication    Sister had decrease in respirations after surgery   Heart murmur    Herniated lumbar intervertebral disc    Hypertension    Paroxysmal atrial fibrillation ([place])    SINCE AGE 75    PE (pulmonary embolism)    chronically anticoagulated with coumadin   Pneumonia  07/20/12   PONV (postoperative nausea and vomiting)    Rheumatoid arthritis (HCC)    Sickle cell trait (HCC)    Sinus congestion 08/14/2018   C/O NASAL/CHEST  CONGESTION, COUGHING WITH PHLEGM, PER PATIENT , SHE IS TO BEGIN TAKING AUGMENTIN DOSE PACK TODAY;     Strep throat at age 75   The patient reports a severe strep  throat infection at age 63 and   is not clear as to whether or not she may have had rheumatic fever.   Symptomatic premature ventricular contractions    improved s/p ablation   Urgency of urination    Uterine bleeding 2008   Uterine artery embolization in 2008 for uterine bleeding.     Family History  Problem Relation Age of Onset   Hypertension Mother    Diabetes Mother    Uterine cancer Sister    Prostate cancer Brother    Prostate cancer Paternal Grandmother    Colon cancer Paternal Grandfather    Prostate cancer Brother    Prostate cancer Brother    Healthy Son    Past Surgical History:  Procedure Laterality Date   COLONOSCOPY W/ POLYPECTOMY     Diagnostic D&C hysteroscopy and Novasure ablation  03/17/2004   DILATION AND CURETTAGE OF UTERUS  12/17/2011   Procedure: DILATATION AND CURETTAGE;  Surgeon: Frederico Hamman, MD;  Location: [facility];  Service: Gynecology;  Laterality: N/A;  With Attempted Hydrothermal Ablation   LUMBAR LAMINECTOMY/DECOMPRESSION MICRODISCECTOMY Left 02/21/2013   Procedure: LUMBAR LAMINECTOMY/DECOMPRESSION MICRODISCECTOMY 1 LEVEL;  Surgeon: Winfield Cunas, MD;  Location: [facility];  Service: Neurosurgery;  Laterality: Left;  LEFT Lumbar Three-Four Laminotomy foraminotomy microdiskectomy   MANDIBLE SURGERY     under bite   PVC Ablation  2010   ROBOTIC ASSISTED TOTAL HYSTERECTOMY WITH BILATERAL SALPINGO OOPHERECTOMY Bilateral 08/21/2018   Procedure: XI ROBOTIC ASSISTED TOTAL HYSTERECTOMY WITH BILATERAL SALPINGO OOPHORECTOMY;  Surgeon: Everitt Amber, MD;  Location: [facility];  Service: Gynecology;  Laterality: Bilateral;   SPINE SURGERY     TONSILLECTOMY     UTERINE ARTERY EMBOLIZATION  2008   for uterine bleeding   VASCULAR SURGERY     laser of left leg   Social History   Social History Narrative   Works as a Marine scientist at [employer] History  Administered Date(s) Administered   Influenza,inj,Quad PF,6+ Mos  06/14/2019   PFIZER(Purple Top)SARS-COV-2 Vaccination 11/08/2019, 11/30/2019, 08/19/2020   Tdap 02/03/2015     Objective: Vital Signs: BP (!) 144/80 (BP Location: Left Arm, Patient Position: Sitting, Cuff Size: Large)   Pulse 61   Ht 5\' 9"  (1.753 m)   Wt 282 lb (127.9 kg)   BMI 41.64 kg/m    Physical Exam Vitals and nursing note reviewed.  Constitutional:      Appearance: She is well-developed.  HENT:     Head: Normocephalic and atraumatic.  Eyes:     Conjunctiva/sclera: Conjunctivae normal.  Pulmonary:     Effort: Pulmonary effort is normal.  Abdominal:     Palpations: Abdomen is soft.  Musculoskeletal:     Cervical back: Normal range of motion.  Skin:    General: Skin is warm and dry.     Capillary Refill: Capillary refill takes less than 2 seconds.  Neurological:     Mental Status: She is alert and oriented to person, place, and time.  Psychiatric:        Behavior: Behavior normal.     Musculoskeletal Exam:  C-spine, thoracic spine, lumbar spine have good range of motion with no discomfort.  Shoulder joints, elbow joints, wrist joints, MCPs, PIPs, DIPs have good range of motion with no synovitis.  She was able to make a complete fist bilaterally.  Hip joints have good range of motion with no discomfort.  No tenderness over trochanteric bursa bilaterally.  Knee joints have good range of motion with no warmth or effusion.  Ankle joints have good range of motion with no joint tenderness.  Pedal edema noted bilaterally.  CDAI Exam: CDAI Score: 0.6  Patient Global: 3 mm; Provider Global: 3 mm Swollen: 0 ; Tender: 0  Joint Exam 03/23/2021   No joint exam has been documented for this visit   There is currently no information documented on the homunculus. Go to the Rheumatology activity and complete the homunculus joint exam.  Investigation: No additional findings.  Imaging: No results found.  Recent Labs: Lab Results  Component Value Date   WBC 5.6 11/13/2020    HGB 11.8 11/13/2020   PLT 220 11/13/2020   NA 140 11/13/2020   K 4.5 11/13/2020   CL 106 11/13/2020   CO2 28 11/13/2020   GLUCOSE 94 11/13/2020   BUN 16 11/13/2020   CREATININE 0.97 11/13/2020   BILITOT 0.4 11/13/2020   ALKPHOS 80 08/14/2018   AST 13 11/13/2020   ALT 10 11/13/2020   PROT 6.7 11/13/2020   ALBUMIN 4.3 08/14/2018   CALCIUM 9.0 11/13/2020   GFRAA 72 11/13/2020   QFTBGOLDPLUS NEGATIVE 01/15/2019    Speciality Comments: PLQ Eye Exam 03/09/2021 WNL @ [facility]  Follow up in 6 months   Procedures:  No procedures performed Allergies: Methylprednisolone, Sulfa antibiotics, and Sulfonamide derivatives   Assessment / Plan:     Visit Diagnoses: Rheumatoid arthritis with rheumatoid factor of multiple sites without organ or systems involvement (HCC) - RF 74, CCP >250 ,Sed rate 45, ANA 1: 320 cytoplasmic, positive synovitis: She has no synovitis on examination today.  She has been taking Plaquenil 200 mg 1 tablet by mouth twice daily.  Her compliance with taking Plaquenil twice daily has improved significantly.  She has not missed any doses recently and has been tolerating it without any side effects.  She continues to have occasional pain and stiffness in both hands especially in her right hand after overuse activities.  Several days ago she was experiencing pain and swelling in her right middle finger which has since resolved.  Her symptoms have been less frequent and less severe since increasing the dose of Plaquenil after her last office visit in April 2022.  She has hesitant to add on methotrexate or Arava at this time.  She would like to continue on Plaquenil as monotherapy.  She plans on continuing to take omega-3, tart cherry, and turmeric and will consider adding on ginger.  She was advised to notify us if she develops increased joint pain or joint swelling.  She will follow-up in the office in 5 months.  High risk medication use - Plaquenil 200 mg 1 tablet by mouth  twice daily-Started in May 2020.  CBC and CMP drawn on 11/13/20.  CBC and CMP will be drawn today to monitor for drug toxicity.  PLQ Eye Exam 03/09/2021 WNL @ [facility] Follow up in 6 months  Plan: CBC with Differential/Platelet, COMPLETE METABOLIC PANEL WITH GFR  Primary osteoarthritis of both hands: She experiences intermittent pain and stiffness in both hands especially in her right hand.  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  We discussed the importance of joint protection and muscle strengthening.  She was encouraged to continue to take natural anti-inflammatories as previously discussed.  Trochanteric bursitis of both hips: She has good range of motion of both hip joints on examination today with no discomfort.  No tenderness over trochanteric bursa at this time.  Primary osteoarthritis of both knees: She has good range of motion of both knee joints on examination.  No warmth or effusion was noted.  Primary osteoarthritis of both feet: She is not experiencing any discomfort in her feet at this time.  Ankle joints have good range of motion with no tenderness.  Pedal edema noted bilaterally.  Positive ANA (antinuclear antibody) - ENA, C3-C4, anticardiolipin, lupus anticoagulant, beta-2 GP 1-were all negative.  She has no other clinical features of autoimmune disease.   Other medical conditions are listed as follows:  History of pulmonary embolism - She remains on Xarelto at this time.  History of DVT (deep vein thrombosis): She is taking Xarelto as prescribed.  History of anemia: Red blood cell count, hemoglobin, and hematocrit with her normal limits on 11/13/2020.  CBC with differential ordered today.  Hyperlipidemia with target LDL less than 130  History of atrial fibrillation  PVC's (premature ventricular contractions)  Essential hypertension: Blood pressure is 144/80 today in the office.  Orders: Orders  Placed This Encounter  Procedures   CBC with Differential/Platelet   COMPLETE METABOLIC PANEL WITH GFR   No orders of the defined types were placed in this encounter.    Follow-Up Instructions: Return in about 5 months (around 08/23/2021) for Rheumatoid arthritis, Osteoarthritis.   Ofilia Neas, PA-C  Note - This record has been created using Dragon software.  Chart creation errors have been sought, but may not always  have been located. Such creation errors do not reflect on  the standard of medical care.

## 2021-03-23 ENCOUNTER — Encounter: Payer: Self-pay | Admitting: Physician Assistant

## 2021-03-23 ENCOUNTER — Other Ambulatory Visit: Payer: Self-pay

## 2021-03-23 ENCOUNTER — Ambulatory Visit: Payer: 59 | Admitting: Physician Assistant

## 2021-03-23 VITALS — BP 144/80 | HR 61 | Ht 69.0 in | Wt 282.0 lb

## 2021-03-23 DIAGNOSIS — M7061 Trochanteric bursitis, right hip: Secondary | ICD-10-CM | POA: Diagnosis not present

## 2021-03-23 DIAGNOSIS — M7062 Trochanteric bursitis, left hip: Secondary | ICD-10-CM

## 2021-03-23 DIAGNOSIS — Z8679 Personal history of other diseases of the circulatory system: Secondary | ICD-10-CM

## 2021-03-23 DIAGNOSIS — M0579 Rheumatoid arthritis with rheumatoid factor of multiple sites without organ or systems involvement: Secondary | ICD-10-CM

## 2021-03-23 DIAGNOSIS — M19071 Primary osteoarthritis, right ankle and foot: Secondary | ICD-10-CM

## 2021-03-23 DIAGNOSIS — M19072 Primary osteoarthritis, left ankle and foot: Secondary | ICD-10-CM

## 2021-03-23 DIAGNOSIS — R7689 Other specified abnormal immunological findings in serum: Secondary | ICD-10-CM

## 2021-03-23 DIAGNOSIS — I493 Ventricular premature depolarization: Secondary | ICD-10-CM

## 2021-03-23 DIAGNOSIS — Z86711 Personal history of pulmonary embolism: Secondary | ICD-10-CM | POA: Diagnosis not present

## 2021-03-23 DIAGNOSIS — R768 Other specified abnormal immunological findings in serum: Secondary | ICD-10-CM

## 2021-03-23 DIAGNOSIS — E785 Hyperlipidemia, unspecified: Secondary | ICD-10-CM

## 2021-03-23 DIAGNOSIS — M170 Bilateral primary osteoarthritis of knee: Secondary | ICD-10-CM | POA: Diagnosis not present

## 2021-03-23 DIAGNOSIS — Z862 Personal history of diseases of the blood and blood-forming organs and certain disorders involving the immune mechanism: Secondary | ICD-10-CM

## 2021-03-23 DIAGNOSIS — M19041 Primary osteoarthritis, right hand: Secondary | ICD-10-CM | POA: Diagnosis not present

## 2021-03-23 DIAGNOSIS — Z86718 Personal history of other venous thrombosis and embolism: Secondary | ICD-10-CM | POA: Diagnosis not present

## 2021-03-23 DIAGNOSIS — M19042 Primary osteoarthritis, left hand: Secondary | ICD-10-CM

## 2021-03-23 DIAGNOSIS — Z79899 Other long term (current) drug therapy: Secondary | ICD-10-CM | POA: Diagnosis not present

## 2021-03-23 DIAGNOSIS — I10 Essential (primary) hypertension: Secondary | ICD-10-CM

## 2021-03-23 LAB — COMPLETE METABOLIC PANEL WITH GFR
AG Ratio: 1.3 (calc) (ref 1.0–2.5)
ALT: 10 U/L (ref 6–29)
AST: 15 U/L (ref 10–35)
Albumin: 4.2 g/dL (ref 3.6–5.1)
Alkaline phosphatase (APISO): 79 U/L (ref 37–153)
BUN: 19 mg/dL (ref 7–25)
CO2: 29 mmol/L (ref 20–32)
Calcium: 9.4 mg/dL (ref 8.6–10.4)
Chloride: 102 mmol/L (ref 98–110)
Creat: 0.97 mg/dL (ref 0.50–1.05)
Globulin: 3.2 g/dL (calc) (ref 1.9–3.7)
Glucose, Bld: 92 mg/dL (ref 65–99)
Potassium: 4.6 mmol/L (ref 3.5–5.3)
Sodium: 138 mmol/L (ref 135–146)
Total Bilirubin: 0.3 mg/dL (ref 0.2–1.2)
Total Protein: 7.4 g/dL (ref 6.1–8.1)
eGFR: 65 mL/min/{1.73_m2} (ref >=60)

## 2021-03-24 NOTE — Progress Notes (Signed)
CMP WNL

## 2021-03-26 ENCOUNTER — Other Ambulatory Visit (HOSPITAL_COMMUNITY): Payer: Self-pay

## 2021-03-26 MED FILL — Losartan Potassium Tab 50 MG: ORAL | 90 days supply | Qty: 90 | Fill #1 | Status: AC

## 2021-05-06 ENCOUNTER — Other Ambulatory Visit (HOSPITAL_COMMUNITY): Payer: Self-pay

## 2021-05-06 MED FILL — Nadolol Tab 20 MG: ORAL | 90 days supply | Qty: 90 | Fill #1 | Status: AC

## 2021-05-06 MED FILL — Rivaroxaban Tab 20 MG: ORAL | 90 days supply | Qty: 90 | Fill #1 | Status: AC

## 2021-05-28 ENCOUNTER — Other Ambulatory Visit (HOSPITAL_COMMUNITY): Payer: Self-pay

## 2021-05-28 ENCOUNTER — Other Ambulatory Visit: Payer: Self-pay

## 2021-05-28 DIAGNOSIS — M0579 Rheumatoid arthritis with rheumatoid factor of multiple sites without organ or systems involvement: Secondary | ICD-10-CM

## 2021-05-28 MED ORDER — HYDROXYCHLOROQUINE SULFATE 200 MG PO TABS
ORAL_TABLET | Freq: Two times a day (BID) | ORAL | 0 refills | Status: DC
  Filled 2021-05-28: qty 180, 90d supply, fill #0

## 2021-05-28 NOTE — Telephone Encounter (Signed)
Next Visit: 09/14/2021  Last Visit: 03/23/2021  Labs: 03/23/2021 CMP WNL.  Eye exam: 03/09/2021 WNL   Current Dose per office note 03/23/2021: Plaquenil 200 mg 1 tablet by mouth twice daily  CH:EKBTCYELYH arthritis with rheumatoid factor of multiple sites without organ or systems involvement   Last Fill: 01/02/2021  Patient advised she needs to update CBC as last one was in March 2022. Patient will come on Monday.   Okay to refill Plaquenil?

## 2021-05-28 NOTE — Telephone Encounter (Signed)
Patient called requesting prescription refill of Hydroxychloroquine to be sent to [facility].

## 2021-06-01 ENCOUNTER — Other Ambulatory Visit: Payer: Self-pay | Admitting: *Deleted

## 2021-06-01 DIAGNOSIS — Z79899 Other long term (current) drug therapy: Secondary | ICD-10-CM

## 2021-06-01 LAB — CBC WITH DIFFERENTIAL/PLATELET
Absolute Monocytes: 590 cells/uL (ref 200–950)
Basophils Absolute: 50 cells/uL (ref 0–200)
Basophils Relative: 0.7 %
Eosinophils Absolute: 151 cells/uL (ref 15–500)
Eosinophils Relative: 2.1 %
HCT: 39.3 % (ref 35.0–45.0)
Hemoglobin: 12.4 g/dL (ref 11.7–15.5)
Lymphs Abs: 1469 cells/uL (ref 850–3900)
MCH: 26.2 pg — ABNORMAL LOW (ref 27.0–33.0)
MCHC: 31.6 g/dL — ABNORMAL LOW (ref 32.0–36.0)
MCV: 83.1 fL (ref 80.0–100.0)
MPV: 10.6 fL (ref 7.5–12.5)
Monocytes Relative: 8.2 %
Neutro Abs: 4939 cells/uL (ref 1500–7800)
Neutrophils Relative %: 68.6 %
Platelets: 254 10*3/uL (ref 140–400)
RBC: 4.73 10*6/uL (ref 3.80–5.10)
RDW: 13 % (ref 11.0–15.0)
Total Lymphocyte: 20.4 %
WBC: 7.2 10*3/uL (ref 3.8–10.8)

## 2021-06-02 NOTE — Progress Notes (Signed)
CBC is stable.

## 2021-06-19 NOTE — Progress Notes (Signed)
PCP:  Janith Lima, MD Primary Cardiologist: None Electrophysiologist: Thompson Grayer, MD   Pamela Davenport is a 65 y.o. female seen today for Thompson Grayer, MD for routine electrophysiology followup.  Since last being seen in our clinic the patient reports doing OK. She has brief palpitations consistent with her PVCs almost daily. She feels like she had AF last in January, used flecainide. Has hard palpitation and brief pause occasionally, especially at night.  she denies exertional chest pain, palpitations, dyspnea, PND, orthopnea, nausea, vomiting, dizziness, syncope, edema, weight gain, or early satiety.  Past Medical History:  Diagnosis Date   Atypical chest pain    normal myoview 11/11/10   Blood transfusion without reported diagnosis    DVT (deep venous thrombosis) (HCC)    recurrent; chronically anticoagulated with coumadin   Family history of anesthesia complication    Sister had decrease in respirations after surgery   Heart murmur    Herniated lumbar intervertebral disc    Hypertension    Paroxysmal atrial fibrillation ([place])    SINCE AGE 46    PE (pulmonary embolism)    chronically anticoagulated with coumadin   Pneumonia 07/20/12   PONV (postoperative nausea and vomiting)    Rheumatoid arthritis (HCC)    Sickle cell trait (HCC)    Sinus congestion 08/14/2018   C/O NASAL/CHEST  CONGESTION, COUGHING WITH PHLEGM, PER PATIENT , SHE IS TO BEGIN TAKING AUGMENTIN DOSE PACK TODAY;     Strep throat at age 89   The patient reports a severe strep throat infection at age 45 and   is not clear as to whether or not she may have had rheumatic fever.   Symptomatic premature ventricular contractions    improved s/p ablation   Urgency of urination    Uterine bleeding 2008   Uterine artery embolization in 2008 for uterine bleeding.    Past Surgical History:  Procedure Laterality Date   COLONOSCOPY W/ POLYPECTOMY     Diagnostic D&C hysteroscopy and Novasure ablation  03/17/2004    DILATION AND CURETTAGE OF UTERUS  12/17/2011   Procedure: DILATATION AND CURETTAGE;  Surgeon: Frederico Hamman, MD;  Location: [facility];  Service: Gynecology;  Laterality: N/A;  With Attempted Hydrothermal Ablation   LUMBAR LAMINECTOMY/DECOMPRESSION MICRODISCECTOMY Left 02/21/2013   Procedure: LUMBAR LAMINECTOMY/DECOMPRESSION MICRODISCECTOMY 1 LEVEL;  Surgeon: Winfield Cunas, MD;  Location: [facility];  Service: Neurosurgery;  Laterality: Left;  LEFT Lumbar Three-Four Laminotomy foraminotomy microdiskectomy   MANDIBLE SURGERY     under bite   PVC Ablation  2010   ROBOTIC ASSISTED TOTAL HYSTERECTOMY WITH BILATERAL SALPINGO OOPHERECTOMY Bilateral 08/21/2018   Procedure: XI ROBOTIC ASSISTED TOTAL HYSTERECTOMY WITH BILATERAL SALPINGO OOPHORECTOMY;  Surgeon: Everitt Amber, MD;  Location: [facility];  Service: Gynecology;  Laterality: Bilateral;   SPINE SURGERY     TONSILLECTOMY     UTERINE ARTERY EMBOLIZATION  2008   for uterine bleeding   VASCULAR SURGERY     laser of left leg    Current Outpatient Medications  Medication Sig Dispense Refill   flecainide (TAMBOCOR) 150 MG tablet Take 2 tablets by mouth at the onset of AFIB 30 tablet 3   hydroxychloroquine (PLAQUENIL) 200 MG tablet TAKE 1 TABLET BY MOUTH 2 TIMES DAILY 180 tablet 0   indapamide (LOZOL) 1.25 MG tablet TAKE 1 TABLET BY MOUTH ONCE DAILY 90 tablet 0   losartan (COZAAR) 50 MG tablet TAKE 1 TABLET BY MOUTH DAILY. 90 tablet 3   nadolol (  CORGARD) 20 MG tablet TAKE 1 TABLET BY MOUTH ONCE A DAY 90 tablet 3   Omega-3 Fatty Acids (OMEGA 3 PO) Take by mouth daily.     rivaroxaban (XARELTO) 20 MG TABS tablet TAKE 1 TABLET BY MOUTH DAILY WITH SUPPER 90 tablet 1   TART CHERRY PO Take by mouth daily.     TURMERIC PO Take by mouth daily.     No current facility-administered medications for this visit.    Allergies  Allergen Reactions   Methylprednisolone     REACTION: Atrial fibrillation at end of medrol dose pack   Sulfa  Antibiotics Hives   Sulfonamide Derivatives Hives    Social History   Socioeconomic History   Marital status: Single    Spouse name: Not on file   Number of children: Not on file   Years of education: Not on file   Highest education level: Not on file  Occupational History   Occupation: Nurse  Tobacco Use   Smoking status: Former    Packs/day: 1.00    Years: 2.00    Pack years: 2.00    Types: Cigarettes    Quit date: 02/13/1979    Years since quitting: 42.3   Smokeless tobacco: Never  Vaping Use   Vaping Use: Never used  Substance and Sexual Activity   Alcohol use: Yes    Comment: rarely   Drug use: No   Sexual activity: Not on file  Other Topics Concern   Not on file  Social History Narrative   Works as a Marine scientist at [employer] Strain: Not on file  Food Insecurity: Not on file  Transportation Needs: Not on file  Physical Activity: Not on file  Stress: Not on file  Social Connections: Not on file  Intimate Partner Violence: Not on file     Review of Systems: All other systems reviewed and are otherwise negative except as noted above.  Physical Exam: Vitals:   06/22/21 0815  BP: (!) 142/82  Pulse: 75  SpO2: 100%  Weight: 285 lb (129.3 kg)  Height: 5\' 9"  (1.753 m)    GEN- The patient is well appearing, alert and oriented x 3 today.   HEENT: normocephalic, atraumatic; sclera clear, conjunctiva pink; hearing intact; oropharynx clear; neck supple, no JVP Lymph- no cervical lymphadenopathy Lungs- Clear to ausculation bilaterally, normal work of breathing.  No wheezes, rales, rhonchi Heart- Regular rate and rhythm, no murmurs, rubs or gallops, PMI not laterally displaced GI- soft, non-tender, non-distended, bowel sounds present, no hepatosplenomegaly Extremities- no clubbing, cyanosis, or edema; DP/PT/radial pulses 2+ bilaterally MS- no significant deformity or atrophy Skin- warm and dry, no rash or  lesion Psych- euthymic mood, full affect Neuro- strength and sensation are intact  EKG is ordered. Personal review of EKG from today shows NSR at 75 bpm, QRS 94 ms, PR interval 176 ms  Additional studies reviewed include: Previous EP office notes.   Assessment and Plan:  1. PVCs EKG today shows NSR without PVCs Overall well controlled with nadolol and prn flecainide   2. Paroxysmal atrial fibrillation EKG today as above.  Continue Xarelto for CHA2DS2/VASc of at least 5   3. HTN Elevated today Increase losartan to 100 mg daily BMET 10 days.    4. Overweight Lifestyle modification encouraged Body mass index is 42.09 kg/m.   Follow up Annually, sooner with issues.   Shirley Friar, PA-C  06/22/21 8:20 AM

## 2021-06-22 ENCOUNTER — Other Ambulatory Visit (HOSPITAL_COMMUNITY): Payer: Self-pay

## 2021-06-22 ENCOUNTER — Ambulatory Visit: Payer: 59 | Admitting: Student

## 2021-06-22 ENCOUNTER — Encounter: Payer: Self-pay | Admitting: Student

## 2021-06-22 ENCOUNTER — Other Ambulatory Visit: Payer: Self-pay

## 2021-06-22 VITALS — BP 142/82 | HR 75 | Ht 69.0 in | Wt 285.0 lb

## 2021-06-22 DIAGNOSIS — I493 Ventricular premature depolarization: Secondary | ICD-10-CM | POA: Diagnosis not present

## 2021-06-22 DIAGNOSIS — I10 Essential (primary) hypertension: Secondary | ICD-10-CM

## 2021-06-22 DIAGNOSIS — I480 Paroxysmal atrial fibrillation: Secondary | ICD-10-CM

## 2021-06-22 MED ORDER — FLECAINIDE ACETATE 150 MG PO TABS
ORAL_TABLET | ORAL | 3 refills | Status: AC
  Filled 2021-06-22: qty 30, 15d supply, fill #0

## 2021-06-22 MED ORDER — LOSARTAN POTASSIUM 100 MG PO TABS
100.0000 mg | ORAL_TABLET | Freq: Every day | ORAL | 3 refills | Status: DC
  Filled 2021-06-22: qty 90, 90d supply, fill #0
  Filled 2021-09-28: qty 90, 90d supply, fill #1
  Filled 2021-12-26: qty 90, 90d supply, fill #2
  Filled 2022-03-30: qty 90, 90d supply, fill #3

## 2021-06-22 MED ORDER — NADOLOL 20 MG PO TABS
ORAL_TABLET | Freq: Every day | ORAL | 3 refills | Status: DC
  Filled 2021-06-22: qty 90, 90d supply, fill #0

## 2021-06-22 MED ORDER — RIVAROXABAN 20 MG PO TABS
ORAL_TABLET | Freq: Every day | ORAL | 3 refills | Status: DC
  Filled 2021-06-22 – 2021-08-07 (×2): qty 90, 90d supply, fill #0
  Filled 2021-11-10: qty 90, 90d supply, fill #1
  Filled 2022-02-03: qty 90, 90d supply, fill #2
  Filled 2022-04-05 – 2022-05-05 (×2): qty 90, 90d supply, fill #3

## 2021-06-22 NOTE — Patient Instructions (Signed)
Medication Instructions:  Your physician has recommended you make the following change in your medication:   INCREASE: Losartan to 100mg  daily  *If you need a refill on your cardiac medications before your next appointment, please call your pharmacy*   Lab Work: BMET on 07/02/2021  If you have labs (blood work) drawn today and your tests are completely normal, you will receive your results only by: [place] (if you have MyChart) OR A paper copy in the mail If you have any lab test that is abnormal or we need to change your treatment, we will call you to review the results.   Follow-Up: At [facility], you and your health needs are our priority.  As part of our continuing mission to provide you with exceptional heart care, we have created designated Provider Care Teams.  These Care Teams include your primary Cardiologist (physician) and Advanced Practice Providers (APPs -  Physician Assistants and Nurse Practitioners) who all work together to provide you with the care you need, when you need it.   Your next appointment:   1 year(s)  The format for your next appointment:   In Person  Provider:   You may see Thompson Grayer, MD or one of the following Advanced Practice Providers on your designated Care Team:   Tommye Standard, [place] Legrand Como "[facility]" [place], [place]

## 2021-06-23 ENCOUNTER — Other Ambulatory Visit: Payer: Self-pay | Admitting: Obstetrics

## 2021-06-23 ENCOUNTER — Other Ambulatory Visit (HOSPITAL_COMMUNITY): Payer: Self-pay

## 2021-06-23 DIAGNOSIS — N3000 Acute cystitis without hematuria: Secondary | ICD-10-CM

## 2021-06-23 DIAGNOSIS — F419 Anxiety disorder, unspecified: Secondary | ICD-10-CM

## 2021-06-23 MED ORDER — DIAZEPAM 5 MG PO TABS
5.0000 mg | ORAL_TABLET | Freq: Two times a day (BID) | ORAL | 0 refills | Status: DC | PRN
  Filled 2021-06-23: qty 30, 15d supply, fill #0

## 2021-06-23 MED ORDER — CIPROFLOXACIN HCL 500 MG PO TABS
500.0000 mg | ORAL_TABLET | Freq: Two times a day (BID) | ORAL | 1 refills | Status: DC
Start: 2021-06-23 — End: 2021-08-26
  Filled 2021-06-23: qty 6, 3d supply, fill #0

## 2021-06-25 ENCOUNTER — Ambulatory Visit: Payer: 59 | Admitting: Student

## 2021-07-02 ENCOUNTER — Other Ambulatory Visit: Payer: Self-pay

## 2021-07-02 ENCOUNTER — Other Ambulatory Visit: Payer: 59

## 2021-07-02 DIAGNOSIS — I493 Ventricular premature depolarization: Secondary | ICD-10-CM

## 2021-07-02 DIAGNOSIS — I480 Paroxysmal atrial fibrillation: Secondary | ICD-10-CM | POA: Diagnosis not present

## 2021-07-02 DIAGNOSIS — I10 Essential (primary) hypertension: Secondary | ICD-10-CM

## 2021-07-03 LAB — BASIC METABOLIC PANEL
BUN/Creatinine Ratio: 17 (ref 12–28)
BUN: 16 mg/dL (ref 8–27)
CO2: 22 mmol/L (ref 20–29)
Calcium: 9.8 mg/dL (ref 8.7–10.3)
Chloride: 106 mmol/L (ref 96–106)
Creatinine, Ser: 0.93 mg/dL (ref 0.57–1.00)
Glucose: 79 mg/dL (ref 70–99)
Potassium: 4.5 mmol/L (ref 3.5–5.2)
Sodium: 143 mmol/L (ref 134–144)
eGFR: 68 mL/min/{1.73_m2} (ref >59)

## 2021-07-09 NOTE — Progress Notes (Signed)
PCP:  Janith Lima, MD Primary Cardiologist: None Electrophysiologist: Thompson Grayer, MD   Pamela Davenport is a 65 y.o. female seen today for Thompson Grayer, MD for acute visit due to chest discomfort .  She was seen 2 weeks ago without daily PVCs but otherwise stable.  Intermittently since having COVID in august she has had chest pressure, at our recent visit she felt this was more likely related to "hard" PVCs, but has become more of a discomfort over the past several weeks.  This pressure or "ache" occurs after walking into school, but also occurs at rest and at times with deep breaths. No syncope. At times it stops once she gets into school, and at others it "lingers" throughout the day as a low intensity discomfort. She also has point tenderness in her upper ribs, L>R sided.  Past Medical History:  Diagnosis Date   Atypical chest pain    normal myoview 11/11/10   Blood transfusion without reported diagnosis    DVT (deep venous thrombosis) (HCC)    recurrent; chronically anticoagulated with coumadin   Family history of anesthesia complication    Sister had decrease in respirations after surgery   Heart murmur    Herniated lumbar intervertebral disc    Hypertension    Paroxysmal atrial fibrillation ([place])    SINCE AGE 71    PE (pulmonary embolism)    chronically anticoagulated with coumadin   Pneumonia 07/20/12   PONV (postoperative nausea and vomiting)    Rheumatoid arthritis (HCC)    Sickle cell trait (HCC)    Sinus congestion 08/14/2018   C/O NASAL/CHEST  CONGESTION, COUGHING WITH PHLEGM, PER PATIENT , SHE IS TO BEGIN TAKING AUGMENTIN DOSE PACK TODAY;     Strep throat at age 22   The patient reports a severe strep throat infection at age 45 and   is not clear as to whether or not she may have had rheumatic fever.   Symptomatic premature ventricular contractions    improved s/p ablation   Urgency of urination    Uterine bleeding 2008   Uterine artery embolization in 2008  for uterine bleeding.    Past Surgical History:  Procedure Laterality Date   COLONOSCOPY W/ POLYPECTOMY     Diagnostic D&C hysteroscopy and Novasure ablation  03/17/2004   DILATION AND CURETTAGE OF UTERUS  12/17/2011   Procedure: DILATATION AND CURETTAGE;  Surgeon: Frederico Hamman, MD;  Location: [facility];  Service: Gynecology;  Laterality: N/A;  With Attempted Hydrothermal Ablation   LUMBAR LAMINECTOMY/DECOMPRESSION MICRODISCECTOMY Left 02/21/2013   Procedure: LUMBAR LAMINECTOMY/DECOMPRESSION MICRODISCECTOMY 1 LEVEL;  Surgeon: Winfield Cunas, MD;  Location: [facility];  Service: Neurosurgery;  Laterality: Left;  LEFT Lumbar Three-Four Laminotomy foraminotomy microdiskectomy   MANDIBLE SURGERY     under bite   PVC Ablation  2010   ROBOTIC ASSISTED TOTAL HYSTERECTOMY WITH BILATERAL SALPINGO OOPHERECTOMY Bilateral 08/21/2018   Procedure: XI ROBOTIC ASSISTED TOTAL HYSTERECTOMY WITH BILATERAL SALPINGO OOPHORECTOMY;  Surgeon: Everitt Amber, MD;  Location: [facility];  Service: Gynecology;  Laterality: Bilateral;   SPINE SURGERY     TONSILLECTOMY     UTERINE ARTERY EMBOLIZATION  2008   for uterine bleeding   VASCULAR SURGERY     laser of left leg    Current Outpatient Medications  Medication Sig Dispense Refill   ciprofloxacin (CIPRO) 500 MG tablet Take 1 tablet (500 mg total) by mouth 2 (two) times daily. 6 tablet 1   diazepam (VALIUM) 5  MG tablet Take 1 tablet (5 mg total) by mouth every 12 (twelve) hours as needed for anxiety. 30 tablet 0   flecainide (TAMBOCOR) 150 MG tablet Take 2 tablets by mouth at the onset of AFIB 30 tablet 3   hydroxychloroquine (PLAQUENIL) 200 MG tablet TAKE 1 TABLET BY MOUTH 2 TIMES DAILY 180 tablet 0   indapamide (LOZOL) 1.25 MG tablet TAKE 1 TABLET BY MOUTH ONCE DAILY 90 tablet 0   losartan (COZAAR) 100 MG tablet Take 1 tablet by mouth daily. 90 tablet 3   nadolol (CORGARD) 20 MG tablet TAKE 1 TABLET BY MOUTH ONCE A DAY 90 tablet 3   Omega-3  Fatty Acids (OMEGA 3 PO) Take by mouth daily.     rivaroxaban (XARELTO) 20 MG TABS tablet TAKE 1 TABLET BY MOUTH DAILY WITH SUPPER 90 tablet 3   TART CHERRY PO Take by mouth daily.     TURMERIC PO Take by mouth daily.     No current facility-administered medications for this visit.    Allergies  Allergen Reactions   Methylprednisolone     REACTION: Atrial fibrillation at end of medrol dose pack   Sulfa Antibiotics Hives   Sulfonamide Derivatives Hives    Social History   Socioeconomic History   Marital status: Single    Spouse name: Not on file   Number of children: Not on file   Years of education: Not on file   Highest education level: Not on file  Occupational History   Occupation: Nurse  Tobacco Use   Smoking status: Former    Packs/day: 1.00    Years: 2.00    Pack years: 2.00    Types: Cigarettes    Quit date: 02/13/1979    Years since quitting: 42.4   Smokeless tobacco: Never  Vaping Use   Vaping Use: Never used  Substance and Sexual Activity   Alcohol use: Yes    Comment: rarely   Drug use: No   Sexual activity: Not on file  Other Topics Concern   Not on file  Social History Narrative   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    Physical Exam: There were no vitals filed for this visit.  General:  Well appearing. No resp difficulty. HEENT: Normal Neck: Supple. JVP 5-6. Carotids 2+ bilat; no bruits. No thyromegaly or nodule noted. Cor: PMI nondisplaced. RRR, No M/G/R noted Lungs: CTAB, normal effort. Abdomen: Soft, non-tender, non-distended, no HSM. No bruits or masses. +BS   Extremities: No  cyanosis, clubbing, or rash. R and LLE no edema.  Neuro: Alert & orientedx3, cranial nerves grossly intact. moves all 4 extremities w/o difficulty. Affect pleasant   EKG is ordered. Personal review of EKG today shows NSR at 77 bpm, irBBB  Additional studies reviewed include: Previous EP office notes.   Assessment and Plan:  1. PVCs EKG today shows none.   Overall well controlled with nadolol and prn flecainide. Hasn't needed flecainide in years   2. Paroxysmal atrial fibrillation EKG today as above.  Continue Xarelto for CHA2DS2/VASc of at least 5   3. HTN  continue losartan 100 mg daily   4. Overweight Lifestyle modification encouraged Body mass index is 42.35 kg/m.   5. Chest discomfort As above with more atypical features than typical.  Given her history, will update Echo and Myoview. She knows to call or seek care if symptoms become worse or more clearly related to exertion.    Shirley Friar, PA-C  07/09/21 1:45 PM

## 2021-07-10 ENCOUNTER — Encounter: Payer: Self-pay | Admitting: Student

## 2021-07-10 ENCOUNTER — Other Ambulatory Visit (HOSPITAL_COMMUNITY): Payer: Self-pay

## 2021-07-10 ENCOUNTER — Ambulatory Visit: Payer: 59 | Admitting: Student

## 2021-07-10 ENCOUNTER — Other Ambulatory Visit: Payer: Self-pay

## 2021-07-10 VITALS — BP 150/88 | HR 77 | Ht 69.0 in | Wt 286.8 lb

## 2021-07-10 DIAGNOSIS — I493 Ventricular premature depolarization: Secondary | ICD-10-CM

## 2021-07-10 DIAGNOSIS — R0789 Other chest pain: Secondary | ICD-10-CM

## 2021-07-10 DIAGNOSIS — I10 Essential (primary) hypertension: Secondary | ICD-10-CM

## 2021-07-10 DIAGNOSIS — I480 Paroxysmal atrial fibrillation: Secondary | ICD-10-CM

## 2021-07-10 MED ORDER — NADOLOL 20 MG PO TABS
ORAL_TABLET | Freq: Every day | ORAL | 3 refills | Status: DC
  Filled 2021-07-10: qty 90, fill #0
  Filled 2021-08-07: qty 90, 90d supply, fill #0
  Filled 2021-11-10: qty 90, 90d supply, fill #1
  Filled 2022-02-03: qty 90, 90d supply, fill #2
  Filled 2022-05-05: qty 90, 90d supply, fill #3

## 2021-07-10 NOTE — Patient Instructions (Signed)
Medication Instructions:  Your physician recommends that you continue on your current medications as directed. Please refer to the Current Medication list given to you today.  *If you need a refill on your cardiac medications before your next appointment, please call your pharmacy*   Lab Work: None If you have labs (blood work) drawn today and your tests are completely normal, you will receive your results only by: [place] (if you have MyChart) OR A paper copy in the mail If you have any lab test that is abnormal or we need to change your treatment, we will call you to review the results.   Testing/Procedures: Your physician has requested that you have an echocardiogram. Echocardiography is a painless test that uses sound waves to create images of your heart. It provides your doctor with information about the size and shape of your heart and how well your heart's chambers and valves are working. This procedure takes approximately one hour. There are no restrictions for this procedure.  Your physician has requested that you have a lexiscan myoview. For further information please visit [email or web address]. Please follow instruction sheet, as given.    Follow-Up: At [facility], you and your health needs are our priority.  As part of our continuing mission to provide you with exceptional heart care, we have created designated Provider Care Teams.  These Care Teams include your primary Cardiologist (physician) and Advanced Practice Providers (APPs -  Physician Assistants and Nurse Practitioners) who all work together to provide you with the care you need, when you need it.   Your next appointment:   6 month(s)  The format for your next appointment:   In Person  Provider:   You may see Thompson Grayer, MD or one of the following Advanced Practice Providers on your designated Care Team:   Tommye Standard, [place] Legrand Como "[facility]" [place], [place]

## 2021-07-10 NOTE — Addendum Note (Signed)
Addended by: Barrington Ellison A on: 07/10/2021 12:16 PM   Modules accepted: Orders

## 2021-07-14 NOTE — Addendum Note (Signed)
Addended byDanielle Dess on: 07/14/2021 10:51 AM   Modules accepted: Orders

## 2021-07-27 ENCOUNTER — Telehealth (HOSPITAL_COMMUNITY): Payer: Self-pay | Admitting: *Deleted

## 2021-07-27 NOTE — Telephone Encounter (Signed)
Patient given detailed instructions per Myocardial Perfusion Study Information Sheet for the test on  08/03/21  Patient notified to arrive 15 minutes early and that it is imperative to arrive on time for appointment to keep from having the test rescheduled.  If you need to cancel or reschedule your appointment, please call the office within 24 hours of your appointment. . Patient verbalized understanding. Pamela Davenport

## 2021-08-03 ENCOUNTER — Ambulatory Visit (HOSPITAL_BASED_OUTPATIENT_CLINIC_OR_DEPARTMENT_OTHER): Payer: 59

## 2021-08-03 ENCOUNTER — Ambulatory Visit (HOSPITAL_COMMUNITY): Payer: 59 | Attending: Cardiovascular Disease

## 2021-08-03 ENCOUNTER — Other Ambulatory Visit: Payer: Self-pay

## 2021-08-03 DIAGNOSIS — R0789 Other chest pain: Secondary | ICD-10-CM | POA: Diagnosis not present

## 2021-08-03 LAB — ECHOCARDIOGRAM COMPLETE
Area-P 1/2: 6.32 cm2
Height: 69 in
MV M vel: 4.78 m/s
MV Peak grad: 91.4 mmHg
Radius: 0.6 cm
S' Lateral: 3 cm
Weight: 4576 oz

## 2021-08-03 LAB — MYOCARDIAL PERFUSION IMAGING
Base ST Depression (mm): 0 mm
LV dias vol: 106 mL (ref 46–106)
LV sys vol: 45 mL
Nuc Stress EF: 57 %
Peak HR: 102 {beats}/min
Rest HR: 73 {beats}/min
Rest Nuclear Isotope Dose: 11 mCi
SDS: 2
SRS: 0
SSS: 2
ST Depression (mm): 0 mm
Stress Nuclear Isotope Dose: 32.4 mCi
TID: 1.02

## 2021-08-03 MED ORDER — TECHNETIUM TC 99M TETROFOSMIN IV KIT
11.0000 | PACK | Freq: Once | INTRAVENOUS | Status: AC | PRN
  Administered 2021-08-03: 11 via INTRAVENOUS
  Filled 2021-08-03: qty 11

## 2021-08-03 MED ORDER — TECHNETIUM TC 99M TETROFOSMIN IV KIT
32.4000 | PACK | Freq: Once | INTRAVENOUS | Status: AC | PRN
  Administered 2021-08-03: 32.4 via INTRAVENOUS
  Filled 2021-08-03: qty 33

## 2021-08-03 MED ORDER — REGADENOSON 0.4 MG/5ML IV SOLN
0.4000 mg | Freq: Once | INTRAVENOUS | Status: AC
  Administered 2021-08-03: 0.4 mg via INTRAVENOUS

## 2021-08-05 DIAGNOSIS — Z01419 Encounter for gynecological examination (general) (routine) without abnormal findings: Secondary | ICD-10-CM | POA: Diagnosis not present

## 2021-08-05 DIAGNOSIS — Z90710 Acquired absence of both cervix and uterus: Secondary | ICD-10-CM | POA: Diagnosis not present

## 2021-08-05 DIAGNOSIS — B372 Candidiasis of skin and nail: Secondary | ICD-10-CM | POA: Diagnosis not present

## 2021-08-05 DIAGNOSIS — N3941 Urge incontinence: Secondary | ICD-10-CM | POA: Diagnosis not present

## 2021-08-05 DIAGNOSIS — R32 Unspecified urinary incontinence: Secondary | ICD-10-CM | POA: Diagnosis not present

## 2021-08-07 ENCOUNTER — Other Ambulatory Visit (HOSPITAL_COMMUNITY): Payer: Self-pay

## 2021-08-10 ENCOUNTER — Other Ambulatory Visit (HOSPITAL_COMMUNITY): Payer: Self-pay

## 2021-08-10 MED ORDER — OXYBUTYNIN CHLORIDE ER 10 MG PO TB24
10.0000 mg | ORAL_TABLET | Freq: Every day | ORAL | 11 refills | Status: DC
  Filled 2021-08-10: qty 30, 30d supply, fill #0

## 2021-08-10 MED ORDER — NYSTATIN-TRIAMCINOLONE 100000-0.1 UNIT/GM-% EX OINT
TOPICAL_OINTMENT | CUTANEOUS | 2 refills | Status: DC
  Filled 2021-08-10: qty 30, 10d supply, fill #0
  Filled 2022-04-05: qty 30, 10d supply, fill #1

## 2021-08-11 ENCOUNTER — Other Ambulatory Visit (HOSPITAL_COMMUNITY): Payer: Self-pay

## 2021-08-26 ENCOUNTER — Other Ambulatory Visit (HOSPITAL_COMMUNITY): Payer: Self-pay

## 2021-08-26 ENCOUNTER — Other Ambulatory Visit: Payer: Self-pay

## 2021-08-26 ENCOUNTER — Ambulatory Visit (INDEPENDENT_AMBULATORY_CARE_PROVIDER_SITE_OTHER): Payer: 59 | Admitting: Internal Medicine

## 2021-08-26 ENCOUNTER — Encounter: Payer: Self-pay | Admitting: Internal Medicine

## 2021-08-26 VITALS — BP 168/86 | HR 77 | Temp 98.1°F | Ht 69.0 in | Wt 284.0 lb

## 2021-08-26 DIAGNOSIS — D513 Other dietary vitamin B12 deficiency anemia: Secondary | ICD-10-CM | POA: Diagnosis not present

## 2021-08-26 DIAGNOSIS — E785 Hyperlipidemia, unspecified: Secondary | ICD-10-CM

## 2021-08-26 DIAGNOSIS — I10 Essential (primary) hypertension: Secondary | ICD-10-CM | POA: Diagnosis not present

## 2021-08-26 DIAGNOSIS — Z1231 Encounter for screening mammogram for malignant neoplasm of breast: Secondary | ICD-10-CM

## 2021-08-26 DIAGNOSIS — D509 Iron deficiency anemia, unspecified: Secondary | ICD-10-CM

## 2021-08-26 DIAGNOSIS — Z23 Encounter for immunization: Secondary | ICD-10-CM | POA: Diagnosis not present

## 2021-08-26 DIAGNOSIS — Z0001 Encounter for general adult medical examination with abnormal findings: Secondary | ICD-10-CM

## 2021-08-26 DIAGNOSIS — I480 Paroxysmal atrial fibrillation: Secondary | ICD-10-CM

## 2021-08-26 LAB — URINALYSIS, ROUTINE W REFLEX MICROSCOPIC
Bilirubin Urine: NEGATIVE
Ketones, ur: NEGATIVE
Leukocytes,Ua: NEGATIVE
Nitrite: NEGATIVE
Specific Gravity, Urine: 1.025 (ref 1.000–1.030)
Total Protein, Urine: NEGATIVE
Urine Glucose: NEGATIVE
Urobilinogen, UA: 0.2 (ref 0.0–1.0)
pH: 5.5 (ref 5.0–8.0)

## 2021-08-26 LAB — FOLATE: Folate: 10.8 ng/mL (ref >5.9)

## 2021-08-26 LAB — CBC WITH DIFFERENTIAL/PLATELET
Basophils Absolute: 0 10*3/uL (ref 0.0–0.1)
Basophils Relative: 0.3 % (ref 0.0–3.0)
Eosinophils Absolute: 0.1 10*3/uL (ref 0.0–0.7)
Eosinophils Relative: 2.4 % (ref 0.0–5.0)
HCT: 35.2 % — ABNORMAL LOW (ref 36.0–46.0)
Hemoglobin: 11.4 g/dL — ABNORMAL LOW (ref 12.0–15.0)
Lymphocytes Relative: 19.6 % (ref 12.0–46.0)
Lymphs Abs: 1.1 10*3/uL (ref 0.7–4.0)
MCHC: 32.5 g/dL (ref 30.0–36.0)
MCV: 81.6 fl (ref 78.0–100.0)
Monocytes Absolute: 0.4 10*3/uL (ref 0.1–1.0)
Monocytes Relative: 7.6 % (ref 3.0–12.0)
Neutro Abs: 3.9 10*3/uL (ref 1.4–7.7)
Neutrophils Relative %: 70.1 % (ref 43.0–77.0)
Platelets: 213 10*3/uL (ref 150.0–400.0)
RBC: 4.31 Mil/uL (ref 3.87–5.11)
RDW: 13.7 % (ref 11.5–15.5)
WBC: 5.6 10*3/uL (ref 4.0–10.5)

## 2021-08-26 LAB — IBC + FERRITIN
Ferritin: 22.7 ng/mL (ref 10.0–291.0)
Iron: 56 ug/dL (ref 42–145)
Saturation Ratios: 17.7 % — ABNORMAL LOW (ref 20.0–50.0)
TIBC: 316.4 ug/dL (ref 250.0–450.0)
Transferrin: 226 mg/dL (ref 212.0–360.0)

## 2021-08-26 LAB — VITAMIN B12: Vitamin B-12: 257 pg/mL (ref 211–911)

## 2021-08-26 LAB — TSH: TSH: 1.91 u[IU]/mL (ref 0.35–5.50)

## 2021-08-26 MED ORDER — ACCRUFER 30 MG PO CAPS
1.0000 | ORAL_CAPSULE | Freq: Two times a day (BID) | ORAL | 1 refills | Status: DC
  Filled 2021-08-26: qty 180, fill #0

## 2021-08-26 MED ORDER — ACCRUFER 30 MG PO CAPS: 1.0000 | ORAL_CAPSULE | Freq: Two times a day (BID) | ORAL | 1 refills | Status: DC

## 2021-08-26 MED ORDER — INDAPAMIDE 1.25 MG PO TABS
1.2500 mg | ORAL_TABLET | Freq: Every day | ORAL | 1 refills | Status: DC
  Filled 2021-08-26: qty 90, 90d supply, fill #0

## 2021-08-26 NOTE — Progress Notes (Signed)
Subjective:  Patient ID: Pamela Davenport, female    DOB: [birth year]  Age: 65 y.o. MRN: [id number]  CC: Annual Exam, Atrial Fibrillation, and Hypertension  This visit occurred during the SARS-CoV-2 public health emergency.  Safety protocols were in place, including screening questions prior to the visit, additional usage of staff PPE, and extensive cleaning of exam room while observing appropriate contact time as indicated for disinfecting solutions.    HPI LARIAH FLEER presents for a CPX and f/up -   She tells me she is taking the ARB and beta-blocker but is not taking the thiazide diuretic.  She does not monitor her blood pressure.  She denies headache, blurred vision, dizziness, lightheadedness, chest pain, shortness of breath, dyspnea on exertion, or edema.  Outpatient Medications Prior to Visit  Medication Sig Dispense Refill   flecainide (TAMBOCOR) 150 MG tablet Take 2 tablets by mouth at the onset of AFIB 30 tablet 3   hydroxychloroquine (PLAQUENIL) 200 MG tablet TAKE 1 TABLET BY MOUTH 2 TIMES DAILY 180 tablet 0   losartan (COZAAR) 100 MG tablet Take 1 tablet by mouth daily. 90 tablet 3   nadolol (CORGARD) 20 MG tablet TAKE 1 TABLET BY MOUTH ONCE A DAY 90 tablet 3   nystatin-triamcinolone ointment (MYCOLOG) Apply to affected areas two times a day 20 g 2   Omega-3 Fatty Acids (OMEGA 3 PO) Take by mouth daily.     oxybutynin (DITROPAN-XL) 10 MG 24 hr tablet Take 1 tablet (10 mg total) by mouth daily. 30 tablet 11   rivaroxaban (XARELTO) 20 MG TABS tablet TAKE 1 TABLET BY MOUTH DAILY WITH SUPPER 90 tablet 3   TURMERIC PO Take by mouth daily.     ciprofloxacin (CIPRO) 500 MG tablet Take 1 tablet (500 mg total) by mouth 2 (two) times daily. 6 tablet 1   diazepam (VALIUM) 5 MG tablet Take 1 tablet (5 mg total) by mouth every 12 (twelve) hours as needed for anxiety. 30 tablet 0   indapamide (LOZOL) 1.25 MG tablet TAKE 1 TABLET BY MOUTH ONCE DAILY 90 tablet 0   TART CHERRY PO Take by  mouth daily.     No facility-administered medications prior to visit.    ROS Review of Systems  Constitutional:  Negative for appetite change, chills, diaphoresis, fatigue, fever and unexpected weight change.  HENT: Negative.    Eyes: Negative.   Respiratory:  Negative for apnea, cough, shortness of breath and wheezing.   Cardiovascular:  Negative for chest pain, palpitations and leg swelling.  Gastrointestinal:  Negative for abdominal pain, blood in stool, constipation, diarrhea and vomiting.  Endocrine: Negative.   Genitourinary: Negative.  Negative for difficulty urinating.  Musculoskeletal: Negative.  Negative for arthralgias, joint swelling and myalgias.  Skin: Negative.  Negative for color change and pallor.  Allergic/Immunologic: Negative.   Neurological: Negative.  Negative for dizziness and light-headedness.  Hematological:  Negative for adenopathy. Does not bruise/bleed easily.  Psychiatric/Behavioral: Negative.     Objective:  BP (!) 168/86 Comment: PCP recheck   Pulse 77    Temp 98.1 F (36.7 C) (Oral)    Ht 5\' 9"  (1.753 m)    Wt 284 lb (128.8 kg)    SpO2 98%    BMI 41.94 kg/m   BP Readings from Last 3 Encounters:  08/26/21 (!) 168/86  07/10/21 (!) 150/88  06/22/21 (!) 142/82    Wt Readings from Last 3 Encounters:  08/26/21 284 lb (128.8 kg)  08/03/21 286 lb (129.7 kg)  07/10/21 286 lb 12.8 oz (130.1 kg)    Physical Exam Vitals reviewed.  Constitutional:      Appearance: She is obese.  HENT:     Nose: Nose normal.     Mouth/Throat:     Mouth: Mucous membranes are moist.  Eyes:     General: No scleral icterus.    Conjunctiva/sclera: Conjunctivae normal.  Cardiovascular:     Rate and Rhythm: Normal rate and regular rhythm.     Heart sounds: No murmur heard. Pulmonary:     Effort: Pulmonary effort is normal.     Breath sounds: No stridor. No wheezing, rhonchi or rales.  Abdominal:     General: Abdomen is flat.     Palpations: There is no mass.      Tenderness: There is no abdominal tenderness. There is no guarding.     Hernia: No hernia is present.  Musculoskeletal:     Cervical back: Neck supple.     Right lower leg: No edema.     Left lower leg: No edema.  Lymphadenopathy:     Cervical: No cervical adenopathy.  Skin:    General: Skin is warm and dry.     Findings: No rash.  Neurological:     General: No focal deficit present.     Mental Status: She is alert. Mental status is at baseline.  Psychiatric:        Mood and Affect: Mood normal.        Behavior: Behavior normal.    Lab Results  Component Value Date   WBC 5.6 08/26/2021   HGB 11.4 (L) 08/26/2021   HCT 35.2 (L) 08/26/2021   PLT 213.0 08/26/2021   GLUCOSE 79 07/02/2021   CHOL 158 08/27/2021   TRIG 59.0 08/27/2021   HDL 45.80 08/27/2021   LDLCALC 101 (H) 08/27/2021   ALT 10 03/23/2021   AST 15 03/23/2021   NA 143 07/02/2021   K 4.5 07/02/2021   CL 106 07/02/2021   CREATININE 0.93 07/02/2021   BUN 16 07/02/2021   CO2 22 07/02/2021   TSH 1.91 08/26/2021   INR 3.1 04/26/2013    No results found.  Assessment & Plan:   Vivian was seen today for annual exam, atrial fibrillation and hypertension.  Diagnoses and all orders for this visit:  Essential hypertension- Her blood pressure is not adequately well controlled.  Will check labs to screen for secondary causes and endorgan damage.  I recommended that she restart the thiazide diuretic. -     Aldosterone + renin activity w/ ratio; Future -     TSH; Future -     Urinalysis, Routine w reflex microscopic; Future -     indapamide (LOZOL) 1.25 MG tablet; Take 1 tablet (1.25 mg total) by mouth daily. -     Urinalysis, Routine w reflex microscopic -     TSH -     Aldosterone + renin activity w/ ratio  Other dietary vitamin B12 deficiency anemia- B12 and folate are normal. -     Vitamin B12; Future -     CBC with Differential/Platelet; Future -     Folate; Future -     Folate -     CBC with  Differential/Platelet -     Vitamin B12  Iron deficiency anemia, unspecified iron deficiency anemia type- She identifies no sources of blood loss.  I recommended that she undergo a GI work-up to screen for sources of blood loss.  Will start an iron supplement. -  IBC + Ferritin; Future -     CBC with Differential/Platelet; Future -     CBC with Differential/Platelet -     IBC + Ferritin -     Discontinue: Ferric Maltol (ACCRUFER) 30 MG CAPS; Take 1 capsule by mouth in the morning and at bedtime. -     Ferric Maltol (ACCRUFER) 30 MG CAPS; Take 1 capsule by mouth in the morning and at bedtime. -     Ambulatory referral to Gastroenterology  Encounter for general adult medical examination with abnormal findings- Exam completed, labs reviewed, vaccines reviewed and updated, cancer screenings addressed, patient education was given. -     Lipid panel; Future -     Lipid panel; Future -     Lipid panel  Obesity, morbid, BMI 40.0-49.9 ([place])- Labs are negative for secondary causes. -     TSH; Future -     TSH  Need for vaccination -     Pneumococcal conjugate vaccine 20-valent  PAF (paroxysmal atrial fibrillation) ([place])- She is maintaining sinus rhythm.  Will continue the DOAC. -     TSH; Future -     TSH  Visit for screening mammogram -     MM DIGITAL SCREENING BILATERAL; Future  Hyperlipidemia with target LDL less than 130- LDL goal achieved. Doing well on the statin   Other orders -     Varicella-zoster vaccine IM (Shingrix)   I have discontinued David Stall. Chizek's TART CHERRY PO, ciprofloxacin, and diazepam. I have also changed her indapamide. Additionally, I am having her maintain her Omega-3 Fatty Acids (OMEGA 3 PO), TURMERIC PO, hydroxychloroquine, rivaroxaban, losartan, flecainide, nadolol, nystatin-triamcinolone ointment, oxybutynin, and ACCRUFeR.  Meds ordered this encounter  Medications   indapamide (LOZOL) 1.25 MG tablet    Sig: Take 1 tablet (1.25 mg total) by mouth  daily.    Dispense:  90 tablet    Refill:  1   DISCONTD: Ferric Maltol (ACCRUFER) 30 MG CAPS    Sig: Take 1 capsule by mouth in the morning and at bedtime.    Dispense:  180 capsule    Refill:  1   Ferric Maltol (ACCRUFER) 30 MG CAPS    Sig: Take 1 capsule by mouth in the morning and at bedtime.    Dispense:  180 capsule    Refill:  1     Follow-up: Return in about 3 months (around 11/24/2021).  Scarlette Calico, MD

## 2021-08-26 NOTE — Patient Instructions (Signed)

## 2021-08-27 ENCOUNTER — Telehealth: Payer: Self-pay | Admitting: *Deleted

## 2021-08-27 ENCOUNTER — Other Ambulatory Visit: Payer: Self-pay | Admitting: Emergency Medicine

## 2021-08-27 DIAGNOSIS — Z0001 Encounter for general adult medical examination with abnormal findings: Secondary | ICD-10-CM

## 2021-08-27 LAB — LIPID PANEL
Cholesterol: 158 mg/dL (ref 0–200)
HDL: 45.8 mg/dL (ref >39.00)
LDL Cholesterol: 101 mg/dL — ABNORMAL HIGH (ref 0–99)
NonHDL: 112.61
Total CHOL/HDL Ratio: 3
Triglycerides: 59 mg/dL (ref 0.0–149.0)
VLDL: 11.8 mg/dL (ref 0.0–40.0)

## 2021-08-27 NOTE — Telephone Encounter (Signed)
Patient states she is having pain in her third finger on her right hand. Patient states she is in a great deal on pain and it is interfering with activities. Patient has been scheduled to see Dr. Estanislado Pandy on 09/01/2021 at 11:00 am. Patient would like to know if there is anything she can be given by mouth to help until she is seen on Tuesday. Please advise.

## 2021-08-27 NOTE — Telephone Encounter (Signed)
I called the patient to discuss the symptoms she has been experiencing and treatment options. She has had intermittent locking and tenderness of the right middle finger likely due to a trigger finger.   She has tried taking ibuprofen without any relief.  She also applied CBD oil topically.    Different treatment options were discussed. She is scheduled for an appointment on Tuesday when Dr. Estanislado Pandy and Ivin Booty are back in the office in order to have an ultrasound guided cortisone injection.    In the meantime she can use a splint or buddy tape to the adjacent digit to rest the tendon.  I also discussed the use of topical voltaren gel which she can apply as needed.  She voiced understanding.

## 2021-08-27 NOTE — Progress Notes (Signed)
Office Visit Note  Patient: Pamela Davenport             Date of Birth: [birth year]           MRN: [id number]             PCP: Janith Lima, MD Referring: Janith Lima, MD Visit Date: 09/01/2021 Occupation: @GUAROCC @  Subjective:  Pain of the Right Middle Finger   History of Present Illness: Pamela Davenport is a 65 y.o. female with a history of arthritis and osteoarthritis.  She states she has been having pain and discomfort in her right middle finger.  She is unable to bend her finger.  She states in 2019 she had cortisone injections in her bilateral hands for trigger finger by Dr. Daylene Katayama.  For the last couple of months she has been having difficulty bending her right index finger.  She states locking up at 2 places.  She has used a splint on her finger.  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  None of the other joints are painful.  She has had some discomfort in her right hip last week which is better now.  She continues to have some discomfort in her knee joints and her feet.  She denies any history of oral ulcers, nasal ulcers, malar rash, photosensitivity, Raynaud's phenomenon or lymphadenopathy.  Activities of Daily Living:  Patient reports morning stiffness for 1 hour.   Patient Reports nocturnal pain.  Difficulty dressing/grooming: Denies Difficulty climbing stairs: Reports Difficulty getting out of chair: Reports Difficulty using hands for taps, buttons, cutlery, and/or writing: Reports  Review of Systems  Constitutional:  Positive for fatigue.  HENT:  Negative for mouth dryness.   Eyes:  Negative for dryness.  Respiratory:  Negative for shortness of breath.   Cardiovascular:  Positive for swelling in legs/feet.  Gastrointestinal:  Negative for constipation.  Endocrine: Positive for cold intolerance and increased urination.  Genitourinary:  Negative for difficulty urinating.  Musculoskeletal:  Positive for joint pain,  gait problem, joint pain, joint swelling, morning stiffness and muscle tenderness.  Skin:  Negative for color change, rash and sensitivity to sunlight.  Allergic/Immunologic: Negative for susceptible to infections.  Neurological:  Positive for weakness.  Hematological:  Negative for bruising/bleeding tendency.  Psychiatric/Behavioral:  Positive for sleep disturbance.    PMFS History:  Patient Active Problem List   Diagnosis Date Noted   Encounter for general adult medical examination with abnormal findings 08/26/2021   Obesity, morbid, BMI 40.0-49.9 ([place]) 08/26/2021   PAF (paroxysmal atrial fibrillation) ([place]) 08/26/2021   Need for vaccination 08/26/2021   GERD without esophagitis 10/04/2019   Vitamin D deficiency disease 09/25/2019   Hypercoagulable state ([place]) 09/24/2019   Iron deficiency anemia 12/07/2018   Other dietary vitamin B12 deficiency anemia 12/07/2018   De Quervain's tenosynovitis 03/01/2018   Visit for screening mammogram 04/18/2017   Hyperlipidemia with target LDL less than 130 10/19/2016   Routine general medical examination at a health care facility 02/03/2015   Essential hypertension 01/13/2015   Chronic pulmonary embolism ([place]) 12/05/2008    Past Medical History:  Diagnosis Date   Atypical chest pain    normal myoview 11/11/10   Blood transfusion without reported diagnosis    DVT (deep venous thrombosis) (HCC)    recurrent; chronically anticoagulated with coumadin   Family history of anesthesia complication    Sister had decrease in respirations after surgery  Heart murmur    Herniated lumbar intervertebral disc    Hypertension    Paroxysmal atrial fibrillation (HCC)    SINCE AGE 77    PE (pulmonary embolism)    chronically anticoagulated with coumadin   Pneumonia 07/20/12   PONV (postoperative nausea and vomiting)    Rheumatoid arthritis (HCC)    Sickle cell trait (HCC)    Sinus congestion 08/14/2018   C/O NASAL/CHEST  CONGESTION, COUGHING WITH  PHLEGM, PER PATIENT , SHE IS TO BEGIN TAKING AUGMENTIN DOSE PACK TODAY;     Strep throat at age 82   The patient reports a severe strep throat infection at age 5 and   is not clear as to whether or not she may have had rheumatic fever.   Symptomatic premature ventricular contractions    improved s/p ablation   Urgency of urination    Uterine bleeding 2008   Uterine artery embolization in 2008 for uterine bleeding.     Family History  Problem Relation Age of Onset   Hypertension Mother    Diabetes Mother    Uterine cancer Sister    Prostate cancer Brother    Prostate cancer Paternal Grandmother    Colon cancer Paternal Grandfather    Prostate cancer Brother    Prostate cancer Brother    Healthy Son    Past Surgical History:  Procedure Laterality Date   COLONOSCOPY W/ POLYPECTOMY     Diagnostic D&C hysteroscopy and Novasure ablation  03/17/2004   DILATION AND CURETTAGE OF UTERUS  12/17/2011   Procedure: DILATATION AND CURETTAGE;  Surgeon: Frederico Hamman, MD;  Location: [facility];  Service: Gynecology;  Laterality: N/A;  With Attempted Hydrothermal Ablation   LUMBAR LAMINECTOMY/DECOMPRESSION MICRODISCECTOMY Left 02/21/2013   Procedure: LUMBAR LAMINECTOMY/DECOMPRESSION MICRODISCECTOMY 1 LEVEL;  Surgeon: Winfield Cunas, MD;  Location: [facility];  Service: Neurosurgery;  Laterality: Left;  LEFT Lumbar Three-Four Laminotomy foraminotomy microdiskectomy   MANDIBLE SURGERY     under bite   PVC Ablation  2010   ROBOTIC ASSISTED TOTAL HYSTERECTOMY WITH BILATERAL SALPINGO OOPHERECTOMY Bilateral 08/21/2018   Procedure: XI ROBOTIC ASSISTED TOTAL HYSTERECTOMY WITH BILATERAL SALPINGO OOPHORECTOMY;  Surgeon: Everitt Amber, MD;  Location: [facility];  Service: Gynecology;  Laterality: Bilateral;   SPINE SURGERY     TONSILLECTOMY     UTERINE ARTERY EMBOLIZATION  2008   for uterine bleeding   VASCULAR SURGERY     laser of left leg   Social History   Social History Narrative    Works as a Marine scientist at [employer] History  Administered Date(s) Administered   Influenza,inj,Quad PF,6+ Mos 06/14/2019   Influenza-Unspecified 05/05/2021   PFIZER(Purple Top)SARS-COV-2 Vaccination 11/08/2019, 11/30/2019, 08/19/2020   PNEUMOCOCCAL CONJUGATE-20 08/26/2021   Tdap 02/03/2015   Zoster Recombinat (Shingrix) 08/26/2021     Objective: Vital Signs: BP 130/79 (BP Location: Left Arm, Patient Position: Sitting, Cuff Size: Normal)    Pulse 73    Resp 16    Ht 5\' 9"  (1.753 m)    Wt 278 lb (126.1 kg)    BMI 41.05 kg/m    Physical Exam Vitals and nursing note reviewed.  Constitutional:      Appearance: She is well-developed.  HENT:     Head: Normocephalic and atraumatic.  Eyes:     Conjunctiva/sclera: Conjunctivae normal.  Cardiovascular:     Rate and Rhythm: Normal rate and regular rhythm.     Heart sounds: Normal heart sounds.  Pulmonary:  Effort: Pulmonary effort is normal.     Breath sounds: Normal breath sounds.  Abdominal:     General: Bowel sounds are normal.     Palpations: Abdomen is soft.  Musculoskeletal:     Cervical back: Normal range of motion.  Lymphadenopathy:     Cervical: No cervical adenopathy.  Skin:    General: Skin is warm and dry.     Capillary Refill: Capillary refill takes less than 2 seconds.  Neurological:     Mental Status: She is alert and oriented to person, place, and time.  Psychiatric:        Behavior: Behavior normal.     Musculoskeletal Exam: C-spine was in good range of motion.  Shoulder joints, elbow joints, wrist joints with good range of motion.  She had no synovitis of her wrist joints or MCPs.  She had difficulty bending her right middle finger.  She had thickening of A1 and A2 pulley.  Hip joints and knee joints with good range of motion.  She has some discomfort range of motion of her right hip joint and tenderness over trochanteric bursa.  There was no tenderness over ankles or MTPs.  CDAI Exam: CDAI  Score: 0.4  Patient Global: 2 mm; Provider Global: 2 mm Swollen: 0 ; Tender: 0  Joint Exam 09/01/2021   No joint exam has been documented for this visit   There is currently no information documented on the homunculus. Go to the Rheumatology activity and complete the homunculus joint exam.  Investigation: No additional findings.  Imaging: Myocardial Perfusion Imaging  Result Date: 08/03/2021   Stress SPECT images were obtained approximately 60 minutes post tracer injection.   The study is normal. The study is low risk.   No ST deviation was noted.   LV perfusion is normal. There is no evidence of ischemia. There is no evidence of infarction.   Left ventricular function is normal. Nuclear stress EF: 57 %. The left ventricular ejection fraction is normal (55-65%). End diastolic cavity size is normal. End systolic cavity size is normal.   Prior study available for comparison from 02/07/2015. Reduced apical counts with normal wall motion consistent with apical thinning artifact. Normal study without evidence of ischemia or infarction. Normal LVEF, 57%. This is a low-risk study.   ECHOCARDIOGRAM COMPLETE  Result Date: 08/03/2021    ECHOCARDIOGRAM REPORT   Patient Name:   GERLEAN CID Date of Exam: 08/03/2021 Medical Rec #:  [id number]       Height:       69.0 in Accession #:    [id number]      Weight:       286.0 lb Date of Birth:  [birth year]       BSA:          2.405 m Patient Age:    63 years        BP:           170/94 mmHg Patient Gender: F               HR:           74 bpm. Exam Location:  [facility] Procedure: 2D Echo, 3D Echo, Cardiac Doppler and Color Doppler Indications:    R07.89 Chest Pain  History:        Patient has prior history of Echocardiogram examinations, most                 recent 07/02/2019. DVT, Arrythmias:Atrial Fibrillation and PVC,  Signs/Symptoms:Murmur; Risk Factors:Hypertension. Hx of COVID                 19.  Sonographer:    Marygrace Drought RCS Referring  Phys: [id number] Elizabeth  1. Prominent LV apical band. Left ventricular ejection fraction, by estimation, is 55 to 60%. The left ventricle has normal function. The left ventricle has no regional wall motion abnormalities. There is mild left ventricular hypertrophy. Left ventricular diastolic parameters were normal.  2. Right ventricular systolic function is normal. The right ventricular size is normal. There is normal pulmonary artery systolic pressure.  3. The mitral valve is abnormal. Mild mitral valve regurgitation. No evidence of mitral stenosis.  4. The aortic valve is normal in structure. Aortic valve regurgitation is not visualized. No aortic stenosis is present.  5. The inferior vena cava is normal in size with greater than 50% respiratory variability, suggesting right atrial pressure of 3 mmHg. FINDINGS  Left Ventricle: Prominent LV apical band. Left ventricular ejection fraction, by estimation, is 55 to 60%. The left ventricle has normal function. The left ventricle has no regional wall motion abnormalities. The left ventricular internal cavity size was normal in size. There is mild left ventricular hypertrophy. Left ventricular diastolic parameters were normal. Right Ventricle: The right ventricular size is normal. No increase in right ventricular wall thickness. Right ventricular systolic function is normal. There is normal pulmonary artery systolic pressure. The tricuspid regurgitant velocity is 2.53 m/s, and  with an assumed right atrial pressure of 3 mmHg, the estimated right ventricular systolic pressure is 70.6 mmHg. Left Atrium: Left atrial size was normal in size. Right Atrium: Right atrial size was normal in size. Pericardium: There is no evidence of pericardial effusion. Mitral Valve: The mitral valve is abnormal. There is moderate thickening of the mitral valve leaflet(s). There is mild calcification of the mitral valve leaflet(s). Mild mitral valve regurgitation. No  evidence of mitral valve stenosis. Tricuspid Valve: The tricuspid valve is normal in structure. Tricuspid valve regurgitation is not demonstrated. No evidence of tricuspid stenosis. Aortic Valve: The aortic valve is normal in structure. Aortic valve regurgitation is not visualized. No aortic stenosis is present. Pulmonic Valve: The pulmonic valve was normal in structure. Pulmonic valve regurgitation is trivial. No evidence of pulmonic stenosis. Aorta: The aortic root is normal in size and structure. Venous: The inferior vena cava is normal in size with greater than 50% respiratory variability, suggesting right atrial pressure of 3 mmHg. IAS/Shunts: The interatrial septum was not well visualized.  LEFT VENTRICLE PLAX 2D LVIDd:         4.40 cm   Diastology LVIDs:         3.00 cm   LV e' medial:    11.10 cm/s LV PW:         1.40 cm   LV E/e' medial:  8.4 LV IVS:        1.30 cm   LV e' lateral:   15.10 cm/s LVOT diam:     1.90 cm   LV E/e' lateral: 6.2 LV SV:         72 LV SV Index:   30 LVOT Area:     2.84 cm                           3D Volume EF:  3D EF:        64 %                          LV EDV:       123 ml                          LV ESV:       44 ml                          LV SV:        79 ml RIGHT VENTRICLE RV Basal diam:  3.00 cm RV S prime:     12.30 cm/s TAPSE (M-mode): 2.0 cm RVSP:           28.6 mmHg LEFT ATRIUM             Index        RIGHT ATRIUM           Index LA diam:        4.20 cm 1.75 cm/m   RA Pressure: 3.00 mmHg LA Vol (A2C):   65.8 ml 27.36 ml/m  RA Area:     14.70 cm LA Vol (A4C):   35.0 ml 14.55 ml/m  RA Volume:   39.70 ml  16.51 ml/m LA Biplane Vol: 53.3 ml 22.16 ml/m  AORTIC VALVE LVOT Vmax:   97.40 cm/s LVOT Vmean:  68.600 cm/s LVOT VTI:    0.253 m  AORTA Ao Root diam: 2.90 cm Ao Asc diam:  3.00 cm MITRAL VALVE                  TRICUSPID VALVE MV Area (PHT):                TR Peak grad:   25.6 mmHg MV Decel Time:                TR Vmax:        253.00 cm/s  MR Peak grad:    91.4 mmHg    Estimated RAP:  3.00 mmHg MR Mean grad:    65.0 mmHg    RVSP:           28.6 mmHg MR Vmax:         478.00 cm/s MR Vmean:        384.0 cm/s   SHUNTS MR PISA:         2.26 cm     Systemic VTI:  0.25 m MR PISA Eff ROA: 15 mm       Systemic Diam: 1.90 cm MR PISA Radius:  0.60 cm MV E velocity: 93.30 cm/s MV A velocity: 80.70 cm/s MV E/A ratio:  1.16 Jenkins Rouge MD Electronically signed by Jenkins Rouge MD Signature Date/Time: 08/03/2021/9:28:17 AM    Final     Recent Labs: Lab Results  Component Value Date   WBC 5.6 08/26/2021   HGB 11.4 (L) 08/26/2021   PLT 213.0 08/26/2021   NA 143 07/02/2021   K 4.5 07/02/2021   CL 106 07/02/2021   CO2 22 07/02/2021   GLUCOSE 79 07/02/2021   BUN 16 07/02/2021   CREATININE 0.93 07/02/2021   BILITOT 0.3 03/23/2021   ALKPHOS 80 08/14/2018   AST 15 03/23/2021   ALT 10 03/23/2021   PROT 7.4 03/23/2021   ALBUMIN 4.3 08/14/2018   CALCIUM 9.8 07/02/2021  GFRAA 72 11/13/2020   QFTBGOLDPLUS NEGATIVE 01/15/2019    Speciality Comments: PLQ Eye Exam 03/09/2021 WNL @ [facility]  Follow up in 6 months   Procedures:  Hand/UE Inj: R long A1 for trigger finger on 09/01/2021 12:23 PM Indications: pain, tendon swelling and therapeutic Details: 27 G needle, ultrasound-guided volar approach Medications: 0.5 mL lidocaine 1 %; 10 mg triamcinolone acetonide 40 MG/ML Aspirate: 0 mL Procedure, treatment alternatives, risks and benefits explained, specific risks discussed. Consent was given by the patient. Immediately prior to procedure a time out was called to verify the correct patient, procedure, equipment, support staff and site/side marked as required. Patient was prepped and draped in the usual sterile fashion.    Hand/UE Inj: R long A2 for trigger finger on 09/01/2021 12:24 PM Indications: pain, tendon swelling and therapeutic Details: 27 G needle, ultrasound-guided volar approach Medications: 0.5 mL lidocaine 1 %; 10 mg  triamcinolone acetonide 40 MG/ML Aspirate: 0 mL Procedure, treatment alternatives, risks and benefits explained, specific risks discussed. Immediately prior to procedure a time out was called to verify the correct patient, procedure, equipment, support staff and site/side marked as required. Patient was prepped and draped in the usual sterile fashion.    Allergies: Methylprednisolone, Sulfa antibiotics, and Sulfonamide derivatives   Assessment / Plan:     Visit Diagnoses: Rheumatoid arthritis with rheumatoid factor of multiple sites without organ or systems involvement ([place]) - RF 74, CCP >250 ,Sed rate 45, ANA 1: 320 cytoplasmic, positive synovitis: Patient had no synovitis on my examination.  She has been doing well on hydroxychloroquine 1 tablet p.o. twice daily.  She has been tolerating medication well.  High risk medication use - Plaquenil 200 mg 1 tablet by mouth twice daily-Started in May 2020. PLQ Eye Exam 03/09/2021.  Labs obtained on August 26, 2021 showed mild anemia.  CMP was normal.  Left findings were discussed with the patient.  Pain of right middle finger -she has been having severe pain and discomfort in her right middle finger and she is unable to bend her finger.  She had tenosynovitis of the flexor tendon with thickening at the A1 and A2 pulley.  After informed consent was obtained and side effects were discussed trigger finger injections were performed as described above.  She tolerated the procedure well.  Postprocedure instructions were given and a splint was applied.  Patient had trigger finger injections in the past by Dr. Suszanne Conners in 2019.  Plan: US Guided Needle Placement, US Guided Needle Placement  Primary osteoarthritis of both hands-she has osteoarthritis in her bilateral hands with DIP and PIP thickening.  Joint protection muscle strengthening was discussed.  Trochanteric bursitis of both hips-she had mild tenderness over trochanteric area.  She states she had more  discomfort in her right hip last week which is better now.  Primary osteoarthritis of both knees-she has chronic pain and discomfort in her knee joints.  Primary osteoarthritis of both feet-chronic pain.  Positive ANA (antinuclear antibody) - ENA, C3-C4, anticardiolipin, lupus anticoagulant, beta-2 GP 1-were all negative.  She has no other clinical features of autoimmune disease.   History of pulmonary embolism - She remains on Xarelto at this time.  History of DVT (deep vein thrombosis)-she is on Xarelto.  Hyperlipidemia with target LDL less than 130  History of anemia  PVC's (premature ventricular contractions)  History of atrial fibrillation  Essential hypertension-blood pressure was normal today.  Orders: Orders Placed This Encounter  Procedures   US Guided Needle Placement  US Guided Needle Placement   No orders of the defined types were placed in this encounter.    Follow-Up Instructions: Return in about 5 months (around 01/30/2022) for Rheumatoid arthritis.   Bo Merino, MD  Note - This record has been created using Editor, commissioning.  Chart creation errors have been sought, but may not always  have been located. Such creation errors do not reflect on  the standard of medical care.

## 2021-08-28 ENCOUNTER — Ambulatory Visit: Payer: 59 | Admitting: Physician Assistant

## 2021-09-01 ENCOUNTER — Ambulatory Visit: Payer: Self-pay

## 2021-09-01 ENCOUNTER — Ambulatory Visit: Payer: 59 | Admitting: Rheumatology

## 2021-09-01 ENCOUNTER — Other Ambulatory Visit: Payer: Self-pay

## 2021-09-01 ENCOUNTER — Encounter: Payer: Self-pay | Admitting: Rheumatology

## 2021-09-01 VITALS — BP 130/79 | HR 73 | Resp 16 | Ht 69.0 in | Wt 278.0 lb

## 2021-09-01 DIAGNOSIS — M0579 Rheumatoid arthritis with rheumatoid factor of multiple sites without organ or systems involvement: Secondary | ICD-10-CM | POA: Diagnosis not present

## 2021-09-01 DIAGNOSIS — I493 Ventricular premature depolarization: Secondary | ICD-10-CM

## 2021-09-01 DIAGNOSIS — R768 Other specified abnormal immunological findings in serum: Secondary | ICD-10-CM

## 2021-09-01 DIAGNOSIS — M79644 Pain in right finger(s): Secondary | ICD-10-CM | POA: Diagnosis not present

## 2021-09-01 DIAGNOSIS — M19042 Primary osteoarthritis, left hand: Secondary | ICD-10-CM

## 2021-09-01 DIAGNOSIS — M19072 Primary osteoarthritis, left ankle and foot: Secondary | ICD-10-CM

## 2021-09-01 DIAGNOSIS — E785 Hyperlipidemia, unspecified: Secondary | ICD-10-CM

## 2021-09-01 DIAGNOSIS — Z79899 Other long term (current) drug therapy: Secondary | ICD-10-CM

## 2021-09-01 DIAGNOSIS — Z86711 Personal history of pulmonary embolism: Secondary | ICD-10-CM | POA: Diagnosis not present

## 2021-09-01 DIAGNOSIS — M170 Bilateral primary osteoarthritis of knee: Secondary | ICD-10-CM | POA: Diagnosis not present

## 2021-09-01 DIAGNOSIS — Z86718 Personal history of other venous thrombosis and embolism: Secondary | ICD-10-CM

## 2021-09-01 DIAGNOSIS — M19041 Primary osteoarthritis, right hand: Secondary | ICD-10-CM | POA: Diagnosis not present

## 2021-09-01 DIAGNOSIS — M19071 Primary osteoarthritis, right ankle and foot: Secondary | ICD-10-CM | POA: Diagnosis not present

## 2021-09-01 DIAGNOSIS — I10 Essential (primary) hypertension: Secondary | ICD-10-CM

## 2021-09-01 DIAGNOSIS — M7061 Trochanteric bursitis, right hip: Secondary | ICD-10-CM

## 2021-09-01 DIAGNOSIS — M7062 Trochanteric bursitis, left hip: Secondary | ICD-10-CM

## 2021-09-01 DIAGNOSIS — Z8679 Personal history of other diseases of the circulatory system: Secondary | ICD-10-CM

## 2021-09-01 DIAGNOSIS — Z862 Personal history of diseases of the blood and blood-forming organs and certain disorders involving the immune mechanism: Secondary | ICD-10-CM

## 2021-09-01 LAB — ALDOSTERONE + RENIN ACTIVITY W/ RATIO
Aldosterone: 2 ng/dL
Renin Activity: <0.04 ng/mL/h — ABNORMAL LOW (ref 0.25–5.82)

## 2021-09-01 MED ORDER — TRIAMCINOLONE ACETONIDE 40 MG/ML IJ SUSP
10.0000 mg | INTRAMUSCULAR | Status: AC | PRN
  Administered 2021-09-01: 10 mg

## 2021-09-01 MED ORDER — LIDOCAINE HCL 1 % IJ SOLN
0.5000 mL | INTRAMUSCULAR | Status: AC | PRN
  Administered 2021-09-01: .5 mL

## 2021-09-01 NOTE — Patient Instructions (Signed)
Standing Labs We placed an order today for your standing lab work.   Please have your standing labs drawn in June  If possible, please have your labs drawn 2 weeks prior to your appointment so that the provider can discuss your results at your appointment.  Please note that you may see your imaging and lab results in [facility] before we have reviewed them. We may be awaiting multiple results to interpret others before contacting you. Please allow our office up to 72 hours to thoroughly review all of the results before contacting the office for clarification of your results.  We have open lab daily: Monday through Thursday from 1:30-4:30 PM and Friday from 1:30-4:00 PM at the office of Dr. Bo Merino, [facility].   Please be advised, all patients with office appointments requiring lab work will take precedent over walk-in lab work.  If possible, please come for your lab work on Monday and Friday afternoons, as you may experience shorter wait times. The office is located at [address], [place], [place], [place] [address] No appointment is necessary.   Labs are drawn by [facility]. Please bring your co-pay at the time of your lab draw.  You may receive a bill from [facility] for your lab work.  If you wish to have your labs drawn at another location, please call the office 24 hours in advance to send orders.  If you have any questions regarding directions or hours of operation,  please call [phone].   As a reminder, please drink plenty of water prior to coming for your lab work. Thanks!   Vaccines You are taking a medication(s) that can suppress your immune system.  The following immunizations are recommended: Flu annually Covid-19  Td/Tdap (tetanus, diphtheria, pertussis) every 10 years Pneumonia (Prevnar 15 then Pneumovax 23 at least 1 year apart.  Alternatively, can take Prevnar 20 without needing additional dose) Shingrix: 2 doses from 4 weeks to 6 months  apart  Please check with your PCP to make sure you are up to date.

## 2021-09-14 ENCOUNTER — Ambulatory Visit: Payer: 59 | Admitting: Rheumatology

## 2021-09-21 DIAGNOSIS — H04123 Dry eye syndrome of bilateral lacrimal glands: Secondary | ICD-10-CM | POA: Diagnosis not present

## 2021-09-21 DIAGNOSIS — H35412 Lattice degeneration of retina, left eye: Secondary | ICD-10-CM | POA: Diagnosis not present

## 2021-09-21 DIAGNOSIS — H2513 Age-related nuclear cataract, bilateral: Secondary | ICD-10-CM | POA: Diagnosis not present

## 2021-09-21 DIAGNOSIS — Z79899 Other long term (current) drug therapy: Secondary | ICD-10-CM | POA: Diagnosis not present

## 2021-09-28 ENCOUNTER — Other Ambulatory Visit: Payer: Self-pay | Admitting: Rheumatology

## 2021-09-28 ENCOUNTER — Other Ambulatory Visit (HOSPITAL_COMMUNITY): Payer: Self-pay

## 2021-09-28 DIAGNOSIS — M0579 Rheumatoid arthritis with rheumatoid factor of multiple sites without organ or systems involvement: Secondary | ICD-10-CM

## 2021-09-28 MED ORDER — HYDROXYCHLOROQUINE SULFATE 200 MG PO TABS
ORAL_TABLET | Freq: Two times a day (BID) | ORAL | 0 refills | Status: DC
  Filled 2021-09-28: qty 180, 90d supply, fill #0

## 2021-09-28 NOTE — Telephone Encounter (Signed)
Next Visit: 03/08/2022  Last Visit: 09/01/2021  Labs: 07/02/2021 BMP WNL, 08/26/2021 Hgb 11.4, Hct 35.2  Eye exam: 03/09/2021 WNL    Current Dose per office note 09/01/2021: Plaquenil 200 mg 1 tablet by mouth twice daily  SP:[id number] arthritis with rheumatoid factor of multiple sites without organ or systems involvement   Last Fill: 05/28/2021  Okay to refill Plaquenil?

## 2021-09-30 ENCOUNTER — Encounter: Payer: Self-pay | Admitting: Internal Medicine

## 2021-10-01 ENCOUNTER — Other Ambulatory Visit (HOSPITAL_COMMUNITY): Payer: Self-pay

## 2021-10-01 ENCOUNTER — Ambulatory Visit: Payer: 59 | Admitting: Internal Medicine

## 2021-10-01 ENCOUNTER — Encounter: Payer: Self-pay | Admitting: Internal Medicine

## 2021-10-01 ENCOUNTER — Other Ambulatory Visit: Payer: Self-pay

## 2021-10-01 ENCOUNTER — Ambulatory Visit (INDEPENDENT_AMBULATORY_CARE_PROVIDER_SITE_OTHER): Payer: 59

## 2021-10-01 VITALS — BP 132/76 | HR 86 | Temp 99.4°F | Ht 69.0 in | Wt 277.2 lb

## 2021-10-01 DIAGNOSIS — D513 Other dietary vitamin B12 deficiency anemia: Secondary | ICD-10-CM

## 2021-10-01 DIAGNOSIS — J069 Acute upper respiratory infection, unspecified: Secondary | ICD-10-CM | POA: Diagnosis not present

## 2021-10-01 DIAGNOSIS — D509 Iron deficiency anemia, unspecified: Secondary | ICD-10-CM

## 2021-10-01 DIAGNOSIS — R051 Acute cough: Secondary | ICD-10-CM

## 2021-10-01 DIAGNOSIS — R059 Cough, unspecified: Secondary | ICD-10-CM | POA: Diagnosis not present

## 2021-10-01 LAB — CBC WITH DIFFERENTIAL/PLATELET
Basophils Absolute: 0 10*3/uL (ref 0.0–0.1)
Basophils Relative: 0.6 % (ref 0.0–3.0)
Eosinophils Absolute: 0.3 10*3/uL (ref 0.0–0.7)
Eosinophils Relative: 4.6 % (ref 0.0–5.0)
HCT: 37.3 % (ref 36.0–46.0)
Hemoglobin: 12.1 g/dL (ref 12.0–15.0)
Lymphocytes Relative: 9.7 % — ABNORMAL LOW (ref 12.0–46.0)
Lymphs Abs: 0.6 10*3/uL — ABNORMAL LOW (ref 0.7–4.0)
MCHC: 32.5 g/dL (ref 30.0–36.0)
MCV: 81 fl (ref 78.0–100.0)
Monocytes Absolute: 0.4 10*3/uL (ref 0.1–1.0)
Monocytes Relative: 7.3 % (ref 3.0–12.0)
Neutro Abs: 4.6 10*3/uL (ref 1.4–7.7)
Neutrophils Relative %: 77.8 % — ABNORMAL HIGH (ref 43.0–77.0)
Platelets: 211 10*3/uL (ref 150.0–400.0)
RBC: 4.6 Mil/uL (ref 3.87–5.11)
RDW: 14.4 % (ref 11.5–15.5)
WBC: 6 10*3/uL (ref 4.0–10.5)

## 2021-10-01 LAB — POC COVID19 BINAXNOW: SARS Coronavirus 2 Ag: NEGATIVE

## 2021-10-01 MED ORDER — HYDROCODONE BIT-HOMATROP MBR 5-1.5 MG/5ML PO SOLN
5.0000 mL | Freq: Three times a day (TID) | ORAL | 0 refills | Status: AC | PRN
  Filled 2021-10-01: qty 120, 8d supply, fill #0

## 2021-10-01 NOTE — Patient Instructions (Signed)

## 2021-10-01 NOTE — Progress Notes (Signed)
Subjective:  Patient ID: Pamela Davenport, female    DOB: [birth year]  Age: 66 y.o. MRN: [id number]  CC: Cough and Anemia  This visit occurred during the SARS-CoV-2 public health emergency.  Safety protocols were in place, including screening questions prior to the visit, additional usage of staff PPE, and extensive cleaning of exam room while observing appropriate contact time as indicated for disinfecting solutions.    HPI Pamela Davenport presents for f/up -  She complains of a 3-day history of cough productive of scant amount of yellow phlegm with shortness of breath and chills.  She denies fever, chest pain, hemoptysis, or night sweats.  Outpatient Medications Prior to Visit  Medication Sig Dispense Refill   Ferric Maltol (ACCRUFER) 30 MG CAPS Take 1 capsule by mouth in the morning and at bedtime. 180 capsule 1   flecainide (TAMBOCOR) 150 MG tablet Take 2 tablets by mouth at the onset of AFIB 30 tablet 3   hydroxychloroquine (PLAQUENIL) 200 MG tablet TAKE 1 TABLET BY MOUTH 2 TIMES DAILY 180 tablet 0   indapamide (LOZOL) 1.25 MG tablet Take 1 tablet (1.25 mg total) by mouth daily. 90 tablet 1   losartan (COZAAR) 100 MG tablet Take 1 tablet by mouth daily. 90 tablet 3   nadolol (CORGARD) 20 MG tablet TAKE 1 TABLET BY MOUTH ONCE A DAY 90 tablet 3   nystatin-triamcinolone ointment (MYCOLOG) Apply to affected areas two times a day 20 g 2   Omega-3 Fatty Acids (OMEGA 3 PO) Take by mouth daily.     oxybutynin (DITROPAN-XL) 10 MG 24 hr tablet Take 1 tablet (10 mg total) by mouth daily. 30 tablet 11   rivaroxaban (XARELTO) 20 MG TABS tablet TAKE 1 TABLET BY MOUTH DAILY WITH SUPPER 90 tablet 3   TURMERIC PO Take by mouth daily.     No facility-administered medications prior to visit.    ROS Review of Systems  Constitutional:  Positive for chills. Negative for fatigue and fever.  HENT:  Negative for rhinorrhea, sore throat and trouble swallowing.   Respiratory:  Positive for cough. Negative  for chest tightness, shortness of breath and wheezing.   Cardiovascular:  Negative for chest pain, palpitations and leg swelling.  Gastrointestinal:  Negative for abdominal pain, constipation, diarrhea, nausea and vomiting.  Genitourinary: Negative.  Negative for difficulty urinating.  Musculoskeletal: Negative.   Skin: Negative.  Negative for rash.  Neurological:  Negative for dizziness, weakness, light-headedness and numbness.  Hematological:  Negative for adenopathy. Does not bruise/bleed easily.  Psychiatric/Behavioral: Negative.     Objective:  BP 132/76 (BP Location: Left Arm, Patient Position: Sitting, Cuff Size: Large)    Pulse 86    Temp 99.4 F (37.4 C) (Oral)    Ht 5\' 9"  (1.753 m)    Wt 277 lb 4 oz (125.8 kg)    SpO2 95%    BMI 40.94 kg/m   BP Readings from Last 3 Encounters:  10/01/21 132/76  09/01/21 130/79  08/26/21 (!) 168/86    Wt Readings from Last 3 Encounters:  10/01/21 277 lb 4 oz (125.8 kg)  09/01/21 278 lb (126.1 kg)  08/26/21 284 lb (128.8 kg)    Physical Exam Vitals reviewed.  Constitutional:      Appearance: She is not ill-appearing.  HENT:     Nose: Nose normal.     Mouth/Throat:     Mouth: Mucous membranes are moist.  Eyes:     General: No scleral icterus.    Conjunctiva/sclera:  Conjunctivae normal.  Cardiovascular:     Rate and Rhythm: Normal rate and regular rhythm.     Heart sounds: No murmur heard. Pulmonary:     Effort: Pulmonary effort is normal.     Breath sounds: No stridor. No wheezing, rhonchi or rales.  Abdominal:     General: Abdomen is flat.     Palpations: There is no mass.     Tenderness: There is no abdominal tenderness. There is no guarding.     Hernia: No hernia is present.  Musculoskeletal:        General: Normal range of motion.     Cervical back: Neck supple.     Right lower leg: No edema.     Left lower leg: No edema.  Lymphadenopathy:     Cervical: No cervical adenopathy.  Skin:    General: Skin is warm and  dry.  Neurological:     General: No focal deficit present.     Mental Status: She is alert.    Lab Results  Component Value Date   WBC 6.0 10/01/2021   HGB 12.1 10/01/2021   HCT 37.3 10/01/2021   PLT 211.0 10/01/2021   GLUCOSE 79 07/02/2021   CHOL 158 08/27/2021   TRIG 59.0 08/27/2021   HDL 45.80 08/27/2021   LDLCALC 101 (H) 08/27/2021   ALT 10 03/23/2021   AST 15 03/23/2021   NA 143 07/02/2021   K 4.5 07/02/2021   CL 106 07/02/2021   CREATININE 0.93 07/02/2021   BUN 16 07/02/2021   CO2 22 07/02/2021   TSH 1.91 08/26/2021   INR 3.1 04/26/2013    DG Chest 2 View  Result Date: 10/01/2021 CLINICAL DATA:  cough EXAM: CHEST - 2 VIEW COMPARISON:  Chest x-ray 02/22/2020, CT chest 07/20/2012 FINDINGS: The heart and mediastinal contours are unchanged. Aortic calcification. No focal consolidation. No pulmonary edema. No pleural effusion. No pneumothorax. No acute osseous abnormality. IMPRESSION: 1. No active cardiopulmonary disease. 2.  Aortic Atherosclerosis (ICD10-I70.0). Electronically Signed   By: Iven Finn M.D.   On: 10/01/2021 15:15     Assessment & Plan:   Pamela Davenport was seen today for cough and anemia.  Diagnoses and all orders for this visit:  Iron deficiency anemia, unspecified iron deficiency anemia type- Her H&H are normal now -     CBC with Differential/Platelet; Future -     CBC with Differential/Platelet  Other dietary vitamin B12 deficiency anemia- Her H&H are normal now -     CBC with Differential/Platelet; Future -     CBC with Differential/Platelet  URI with cough and congestion -     HYDROcodone bit-homatropine (HYCODAN) 5-1.5 MG/5ML syrup; Take 5 mLs by mouth every 8 (eight) hours as needed for up to 8 days for cough.  Acute cough- Her chest x-ray is negative for mass or infiltrate.  COVID antigen is negative.  Will treat symptomatically for viral URI. -     DG Chest 2 View; Future -     POC COVID-19   I am having Pamela Davenport start on HYDROcodone  bit-homatropine. I am also having her maintain her Omega-3 Fatty Acids (OMEGA 3 PO), TURMERIC PO, rivaroxaban, losartan, flecainide, nadolol, nystatin-triamcinolone ointment, oxybutynin, indapamide, ACCRUFeR, and hydroxychloroquine.  Meds ordered this encounter  Medications   HYDROcodone bit-homatropine (HYCODAN) 5-1.5 MG/5ML syrup    Sig: Take 5 mLs by mouth every 8 (eight) hours as needed for up to 8 days for cough.    Dispense:  120 mL  Refill:  0     Follow-up: Return if symptoms worsen or fail to improve.  Scarlette Calico, MD

## 2021-10-20 ENCOUNTER — Ambulatory Visit
Admission: RE | Admit: 2021-10-20 | Discharge: 2021-10-20 | Disposition: A | Discharge status: Home / Self Care | Payer: 59 | Source: Ambulatory Visit | Attending: Internal Medicine | Admitting: Internal Medicine

## 2021-10-20 ENCOUNTER — Other Ambulatory Visit: Payer: Self-pay

## 2021-10-20 DIAGNOSIS — Z1231 Encounter for screening mammogram for malignant neoplasm of breast: Secondary | ICD-10-CM

## 2021-10-21 ENCOUNTER — Other Ambulatory Visit: Payer: Self-pay | Admitting: Internal Medicine

## 2021-10-21 ENCOUNTER — Other Ambulatory Visit: Payer: Self-pay | Admitting: Family Medicine

## 2021-10-21 DIAGNOSIS — R928 Other abnormal and inconclusive findings on diagnostic imaging of breast: Secondary | ICD-10-CM

## 2021-11-02 ENCOUNTER — Ambulatory Visit
Admission: RE | Admit: 2021-11-02 | Discharge: 2021-11-02 | Disposition: A | Discharge status: Home / Self Care | Payer: 59 | Source: Ambulatory Visit | Attending: Internal Medicine | Admitting: Internal Medicine

## 2021-11-02 ENCOUNTER — Other Ambulatory Visit: Payer: Self-pay | Admitting: Internal Medicine

## 2021-11-02 DIAGNOSIS — R928 Other abnormal and inconclusive findings on diagnostic imaging of breast: Secondary | ICD-10-CM

## 2021-11-02 DIAGNOSIS — N6324 Unspecified lump in the left breast, lower inner quadrant: Secondary | ICD-10-CM | POA: Diagnosis not present

## 2021-11-10 ENCOUNTER — Other Ambulatory Visit (HOSPITAL_COMMUNITY): Payer: Self-pay

## 2021-11-18 ENCOUNTER — Encounter: Payer: Self-pay | Admitting: Internal Medicine

## 2021-11-19 ENCOUNTER — Ambulatory Visit: Payer: 59 | Admitting: Nurse Practitioner

## 2021-11-19 ENCOUNTER — Other Ambulatory Visit: Payer: Self-pay

## 2021-11-19 ENCOUNTER — Other Ambulatory Visit (HOSPITAL_COMMUNITY): Payer: Self-pay

## 2021-11-19 VITALS — BP 136/82 | HR 77 | Temp 98.5°F | Ht 69.0 in | Wt 276.4 lb

## 2021-11-19 DIAGNOSIS — R300 Dysuria: Secondary | ICD-10-CM | POA: Diagnosis not present

## 2021-11-19 DIAGNOSIS — R319 Hematuria, unspecified: Secondary | ICD-10-CM | POA: Diagnosis not present

## 2021-11-19 LAB — COMPREHENSIVE METABOLIC PANEL
ALT: 10 U/L (ref 0–35)
AST: 16 U/L (ref 0–37)
Albumin: 4.3 g/dL (ref 3.5–5.2)
Alkaline Phosphatase: 77 U/L (ref 39–117)
BUN: 19 mg/dL (ref 6–23)
CO2: 29 mEq/L (ref 19–32)
Calcium: 9.3 mg/dL (ref 8.4–10.5)
Chloride: 103 mEq/L (ref 96–112)
Creatinine, Ser: 1.1 mg/dL (ref 0.40–1.20)
GFR: 52.7 mL/min — ABNORMAL LOW (ref >60.00)
Glucose, Bld: 95 mg/dL (ref 70–99)
Potassium: 4.7 mEq/L (ref 3.5–5.1)
Sodium: 137 mEq/L (ref 135–145)
Total Bilirubin: 0.4 mg/dL (ref 0.2–1.2)
Total Protein: 7.6 g/dL (ref 6.0–8.3)

## 2021-11-19 LAB — CBC
HCT: 36.3 % (ref 36.0–46.0)
Hemoglobin: 12 g/dL (ref 12.0–15.0)
MCHC: 33.1 g/dL (ref 30.0–36.0)
MCV: 81.4 fl (ref 78.0–100.0)
Platelets: 242 10*3/uL (ref 150.0–400.0)
RBC: 4.47 Mil/uL (ref 3.87–5.11)
RDW: 15.2 % (ref 11.5–15.5)
WBC: 7.1 10*3/uL (ref 4.0–10.5)

## 2021-11-19 LAB — POCT URINALYSIS DIPSTICK
Bilirubin, UA: NEGATIVE
Glucose, UA: NEGATIVE
Ketones, UA: NEGATIVE
Leukocytes, UA: NEGATIVE
Nitrite, UA: NEGATIVE
Protein, UA: POSITIVE — AB
Spec Grav, UA: 1.025 (ref 1.010–1.025)
Urobilinogen, UA: 0.2 E.U./dL
pH, UA: 6 (ref 5.0–8.0)

## 2021-11-19 MED ORDER — NITROFURANTOIN MONOHYD MACRO 100 MG PO CAPS
100.0000 mg | ORAL_CAPSULE | Freq: Two times a day (BID) | ORAL | 0 refills | Status: DC
  Filled 2021-11-19: qty 10, 5d supply, fill #0

## 2021-11-19 MED ORDER — PHENAZOPYRIDINE HCL 100 MG PO TABS
100.0000 mg | ORAL_TABLET | Freq: Two times a day (BID) | ORAL | 0 refills | Status: DC | PRN
Start: 2021-11-19 — End: 2021-12-31
  Filled 2021-11-19: qty 30, 15d supply, fill #0

## 2021-11-19 NOTE — Progress Notes (Signed)
? ? ? ?Subjective:  ?Patient ID: Pamela Davenport, female    DOB: 03-04-1956  Age: 66 y.o. MRN: [id number] ? ?CC:  ?Chief Complaint  ?Patient presents with  ? Dysuria  ? Hematuria  ?  ? ? ?HPI  ?This patient arrives today for the above. ? ?She has been experiencing urinary urgency and frequency ever since she had hysterectomy a few years ago.  However over the last week especially over the last 2 days she is also been experiencing dysuria and has noticed what sounds like dark-colored sediment and possible clots in her urine.  She is on Xarelto for thrombophilia secondary to atrial fibrillation as well as for history of PE and DVT.  She denies fever, nausea, vomiting, but does report 1 loose stool Over the last week. ? ?Past Medical History:  ?Diagnosis Date  ? Atypical chest pain   ? normal myoview 11/11/10  ? Blood transfusion without reported diagnosis   ? DVT (deep venous thrombosis) ([place])   ? recurrent; chronically anticoagulated with coumadin  ? Family history of anesthesia complication   ? Sister had decrease in respirations after surgery  ? Heart murmur   ? Herniated lumbar intervertebral disc   ? Hypertension   ? Paroxysmal atrial fibrillation ([facility])   ? SINCE AGE 75   ? PE (pulmonary embolism)   ? chronically anticoagulated with coumadin  ? Pneumonia 07/20/12  ? PONV (postoperative nausea and vomiting)   ? Rheumatoid arthritis ([place])   ? Sickle cell trait ([place])   ? Sinus congestion 08/14/2018  ? C/O NASAL/CHEST  CONGESTION, COUGHING WITH PHLEGM, PER PATIENT , SHE IS TO BEGIN TAKING AUGMENTIN DOSE PACK TODAY;    ? Strep throat at age 57  ? The patient reports a severe strep throat infection at age 38 and   is not clear as to whether or not she may have had rheumatic fever.  ? Symptomatic premature ventricular contractions   ? improved s/p ablation  ? Urgency of urination   ? Uterine bleeding 2008  ? Uterine artery embolization in 2008 for uterine bleeding.   ? ? ? ? ?Family History  ?Problem Relation Age of  Onset  ? Hypertension Mother   ? Diabetes Mother   ? Uterine cancer Sister   ? Prostate cancer Paternal Grandmother   ? Colon cancer Paternal Grandfather   ? Prostate cancer Brother   ? Prostate cancer Brother   ? Prostate cancer Brother   ? Healthy Son   ? Breast cancer Neg Hx   ? ? ?Social History  ? ?Social History Narrative  ? Works as a Marine scientist at [employer]  ? ?Social History  ? ?Tobacco Use  ? Smoking status: Former  ?  Packs/day: 1.00  ?  Years: 2.00  ?  Pack years: 2.00  ?  Types: Cigarettes  ?  Quit date: 02/13/1979  ?  Years since quitting: 42.7  ? Smokeless tobacco: Never  ?Substance Use Topics  ? Alcohol use: Yes  ?  Comment: rarely  ? ? ? ?Current Meds  ?Medication Sig  ? Ferric Maltol (ACCRUFER) 30 MG CAPS Take 1 capsule by mouth in the morning and at bedtime.  ? flecainide (TAMBOCOR) 150 MG tablet Take 2 tablets by mouth at the onset of AFIB  ? hydroxychloroquine (PLAQUENIL) 200 MG tablet TAKE 1 TABLET BY MOUTH 2 TIMES DAILY  ? indapamide (LOZOL) 1.25 MG tablet Take 1 tablet (1.25 mg total) by mouth daily.  ? losartan (COZAAR) 100  MG tablet Take 1 tablet by mouth daily.  ? nadolol (CORGARD) 20 MG tablet TAKE 1 TABLET BY MOUTH ONCE A DAY  ? nystatin-triamcinolone ointment (MYCOLOG) Apply to affected areas two times a day  ? Omega-3 Fatty Acids (OMEGA 3 PO) Take by mouth daily.  ? rivaroxaban (XARELTO) 20 MG TABS tablet TAKE 1 TABLET BY MOUTH DAILY WITH SUPPER  ? TURMERIC PO Take by mouth daily.  ? ? ?ROS:  ?Review of Systems  ?Constitutional:  Negative for fever.  ?Gastrointestinal:  Negative for diarrhea (one loose stool), nausea and vomiting.  ?Genitourinary:  Positive for dysuria, frequency, hematuria and urgency.  ? ? ?Objective:  ? ?Today's Vitals: BP 136/82   Pulse 77   Temp 98.5 ?F (36.9 ?C) (Oral)   Ht '5\' 9"'$  (1.753 m)   Wt 276 lb 6 oz (125.4 kg)   SpO2 97%   BMI 40.81 kg/m?  ? ?  11/19/2021  ?  2:02 PM 10/01/2021  ?  2:24 PM 09/01/2021  ? 11:23 AM  ?Vitals with BMI  ?Height '5\' 9"'$  '5\' 9"'$  5'  9"  ?Weight 276 lbs 6 oz 277 lbs 4 oz 278 lbs  ?BMI 40.79 40.92 41.03  ?Systolic 967 893 810  ?Diastolic 82 76 79  ?Pulse 77 86 73  ?  ? ?Physical Exam ?Vitals reviewed.  ?Constitutional:   ?   General: She is not in acute distress. ?   Appearance: Normal appearance.  ?HENT:  ?   Head: Normocephalic and atraumatic.  ?Neck:  ?   Vascular: No carotid bruit.  ?Cardiovascular:  ?   Rate and Rhythm: Normal rate and regular rhythm.  ?   Pulses: Normal pulses.  ?   Heart sounds: Normal heart sounds.  ?Pulmonary:  ?   Effort: Pulmonary effort is normal.  ?   Breath sounds: Normal breath sounds.  ?Abdominal:  ?   General: Abdomen is flat. Bowel sounds are normal. There is no distension.  ?   Palpations: Abdomen is soft. There is no mass.  ?   Tenderness: There is no abdominal tenderness. There is no right CVA tenderness, left CVA tenderness or guarding.  ?Skin: ?   General: Skin is warm and dry.  ?Neurological:  ?   General: No focal deficit present.  ?   Mental Status: She is alert and oriented to person, place, and time.  ?Psychiatric:     ?   Mood and Affect: Mood normal.     ?   Behavior: Behavior normal.     ?   Judgment: Judgment normal.  ? ? ? ? ?U/A: Positive for hemoglobin ? ? ?Assessment and Plan  ? ?1. Dysuria   ? ? ? ?Plan: ?1.  We will send urine out for culture.  We will treat empirically for UTI with Macrobid as she is allergic to sulfas, creatinine clearance approximately 86.  We had discussion regarding referral to urology now versus waiting to see if symptoms improve with Macrobid.  She would prefer to see urology for evaluation as she is concerned about the etiology of her hematuria.  Referral ordered today.  We will also check CBC and CMP to monitor for anemia and kidney function. ? ? ?Tests ordered ?Orders Placed This Encounter  ?Procedures  ? CULTURE, URINE COMPREHENSIVE  ? POCT Urinalysis Dipstick  ? ? ? ? ?No orders of the defined types were placed in this encounter. ? ? ?Patient to follow-up in 1  month with PCP, or sooner as  needed. ? ?Ailene Ards, NP ? ?

## 2021-11-20 ENCOUNTER — Other Ambulatory Visit (HOSPITAL_COMMUNITY): Payer: Self-pay

## 2021-11-21 LAB — CULTURE, URINE COMPREHENSIVE: RESULT:: NO GROWTH

## 2021-11-23 ENCOUNTER — Telehealth: Payer: Self-pay | Admitting: Rheumatology

## 2021-11-23 NOTE — Progress Notes (Signed)
? ?Office Visit Note ? ?Patient: Pamela Davenport             ?Date of Birth: [birth year]           ?MRN: [id number]             ?PCP: Janith Lima, MD ?Referring: Janith Lima, MD ?Visit Date: 11/24/2021 ?Occupation: '@[employer]'$ @ ? ?Subjective:  ?Discuss applying for FMLA  ? ?History of Present Illness: Pamela Davenport is a 66 y.o. female with history of seropositive rheumatoid arthritis and osteoarthritis.  She is taking Plaquenil 200 mg 1 tablet by mouth twice daily.  She continues to tolerate Plaquenil without any side effects and has not missed any doses recently.  She continues to experience joint stiffness lasting all day.  She experiences intermittent nocturnal pain.  She had a right middle trigger finger injection performed on 09/01/2021 which has provided significant relief.  She states that at times she experiences pain and stiffness in her right middle and ring finger and is unable to use her right hand when it locks up.  She states that she has been unable to identify triggers but cannot work during these episodes.  She has difficulty performing ADLs during these times.  She is planning on applying for intermittent FMLA to correct checked her job before retiring in the future. ? ? ?Activities of Daily Living:  ?Patient reports morning stiffness for all day. ?Patient Reports nocturnal pain.  ?Difficulty dressing/grooming: Denies ?Difficulty climbing stairs: Reports ?Difficulty getting out of chair: Reports ?Difficulty using hands for taps, buttons, cutlery, and/or writing: Reports ? ?Review of Systems  ?Constitutional:  Positive for fatigue.  ?HENT:  Negative for mouth sores, mouth dryness and nose dryness.   ?Eyes:  Positive for pain and dryness. Negative for itching.  ?Respiratory:  Negative for shortness of breath and difficulty breathing.   ?Cardiovascular:  Positive for palpitations. Negative for chest pain.  ?Gastrointestinal:  Negative for blood in stool, constipation and diarrhea.  ?Endocrine:  Positive for increased urination.  ?Genitourinary:  Positive for hematuria. Negative for difficulty urinating.  ?Musculoskeletal:  Positive for joint pain, joint pain and morning stiffness. Negative for joint swelling, myalgias, muscle tenderness and myalgias.  ?Skin:  Negative for color change, rash, hair loss and redness.  ?Allergic/Immunologic: Negative for susceptible to infections.  ?Neurological:  Positive for headaches. Negative for dizziness, numbness, memory loss and weakness.  ?Hematological:  Negative for bruising/bleeding tendency.  ?Psychiatric/Behavioral:  Positive for sleep disturbance. Negative for confusion.   ? ?PMFS History:  ?Patient Active Problem List  ? Diagnosis Date Noted  ? URI with cough and congestion 10/01/2021  ? Encounter for general adult medical examination with abnormal findings 08/26/2021  ? Obesity, morbid, BMI 40.0-49.9 ([place]) 08/26/2021  ? PAF (paroxysmal atrial fibrillation) ([place]) 08/26/2021  ? Need for vaccination 08/26/2021  ? GERD without esophagitis 10/04/2019  ? Vitamin D deficiency disease 09/25/2019  ? Hypercoagulable state ([place]) 09/24/2019  ? Iron deficiency anemia 12/07/2018  ? Other dietary vitamin B12 deficiency anemia 12/07/2018  ? De Quervain's tenosynovitis 03/01/2018  ? Visit for screening mammogram 04/18/2017  ? Hyperlipidemia with target LDL less than 130 10/19/2016  ? Routine general medical examination at a health care facility 02/03/2015  ? Essential hypertension 01/13/2015  ? Chronic pulmonary embolism ([place]) 12/05/2008  ?  ?Past Medical History:  ?Diagnosis Date  ? Atypical chest pain   ? normal myoview 11/11/10  ? Blood transfusion without reported diagnosis   ? DVT (deep venous thrombosis) (  [place])   ? recurrent; chronically anticoagulated with coumadin  ? Family history of anesthesia complication   ? Sister had decrease in respirations after surgery  ? Heart murmur   ? Herniated lumbar intervertebral disc   ? Hypertension   ? Paroxysmal atrial fibrillation  ([facility])   ? SINCE AGE 53   ? PE (pulmonary embolism)   ? chronically anticoagulated with coumadin  ? Pneumonia 07/20/12  ? PONV (postoperative nausea and vomiting)   ? Rheumatoid arthritis ([place])   ? Sickle cell trait ([place])   ? Sinus congestion 08/14/2018  ? C/O NASAL/CHEST  CONGESTION, COUGHING WITH PHLEGM, PER PATIENT , SHE IS TO BEGIN TAKING AUGMENTIN DOSE PACK TODAY;    ? Strep throat at age 74  ? The patient reports a severe strep throat infection at age 51 and   is not clear as to whether or not she may have had rheumatic fever.  ? Symptomatic premature ventricular contractions   ? improved s/p ablation  ? Urgency of urination   ? Uterine bleeding 2008  ? Uterine artery embolization in 2008 for uterine bleeding.   ?  ?Family History  ?Problem Relation Age of Onset  ? Hypertension Mother   ? Diabetes Mother   ? Uterine cancer Sister   ? Prostate cancer Paternal Grandmother   ? Colon cancer Paternal Grandfather   ? Prostate cancer Brother   ? Prostate cancer Brother   ? Prostate cancer Brother   ? Healthy Son   ? Breast cancer Neg Hx   ? ?Past Surgical History:  ?Procedure Laterality Date  ? COLONOSCOPY W/ POLYPECTOMY    ? Diagnostic D&C hysteroscopy and Novasure ablation  03/17/2004  ? DILATION AND CURETTAGE OF UTERUS  12/17/2011  ? Procedure: DILATATION AND CURETTAGE;  Surgeon: Frederico Hamman, MD;  Location: [facility];  Service: Gynecology;  Laterality: N/A;  With Attempted Hydrothermal Ablation  ? LUMBAR LAMINECTOMY/DECOMPRESSION MICRODISCECTOMY Left 02/21/2013  ? Procedure: LUMBAR LAMINECTOMY/DECOMPRESSION MICRODISCECTOMY 1 LEVEL;  Surgeon: Winfield Cunas, MD;  Location: [facility];  Service: Neurosurgery;  Laterality: Left;  LEFT Lumbar Three-Four Laminotomy foraminotomy microdiskectomy  ? MANDIBLE SURGERY    ? under bite  ? PVC Ablation  2010  ? ROBOTIC ASSISTED TOTAL HYSTERECTOMY WITH BILATERAL SALPINGO OOPHERECTOMY Bilateral 08/21/2018  ? Procedure: XI ROBOTIC ASSISTED TOTAL HYSTERECTOMY WITH BILATERAL  SALPINGO OOPHORECTOMY;  Surgeon: Everitt Amber, MD;  Location: [facility];  Service: Gynecology;  Laterality: Bilateral;  ? SPINE SURGERY    ? TONSILLECTOMY    ? UTERINE ARTERY EMBOLIZATION  2008  ? for uterine bleeding  ? VASCULAR SURGERY    ? laser of left leg  ? ?Social History  ? ?Social History Narrative  ? Works as a Marine scientist at [employer]  ? ?Immunization History  ?Administered Date(s) Administered  ? Influenza,inj,Quad PF,6+ Mos 06/14/2019  ? Influenza-Unspecified 05/05/2021  ? PFIZER(Purple Top)SARS-COV-2 Vaccination 11/08/2019, 11/30/2019, 08/19/2020  ? PNEUMOCOCCAL CONJUGATE-20 08/26/2021  ? Tdap 02/03/2015  ? Zoster Recombinat (Shingrix) 08/26/2021  ?  ? ?Objective: ?Vital Signs: BP (!) 151/83 (BP Location: Left Arm, Patient Position: Sitting, Cuff Size: Large)   Pulse (!) 56   Ht '5\' 9"'$  (1.753 m)   Wt 279 lb 12.8 oz (126.9 kg)   BMI 41.32 kg/m?   ? ?Physical Exam ?Vitals and nursing note reviewed.  ?Constitutional:   ?   Appearance: She is well-developed.  ?HENT:  ?   Head: Normocephalic and atraumatic.  ?Eyes:  ?   Conjunctiva/sclera:  Conjunctivae normal.  ?Cardiovascular:  ?   Rate and Rhythm: Normal rate and regular rhythm.  ?   Heart sounds: Normal heart sounds.  ?Pulmonary:  ?   Effort: Pulmonary effort is normal.  ?   Breath sounds: Normal breath sounds.  ?Abdominal:  ?   General: Bowel sounds are normal.  ?   Palpations: Abdomen is soft.  ?Musculoskeletal:  ?   Cervical back: Normal range of motion.  ?Lymphadenopathy:  ?   Cervical: No cervical adenopathy.  ?Skin: ?   General: Skin is warm and dry.  ?   Capillary Refill: Capillary refill takes less than 2 seconds.  ?Neurological:  ?   Mental Status: She is alert and oriented to person, place, and time.  ?Psychiatric:     ?   Behavior: Behavior normal.  ?  ? ?Musculoskeletal Exam: C-spine has good range of motion with no discomfort.  Some painful range of motion above shoulder joints especially the left shoulder.  Elbow joints  have good range of motion with no tenderness or inflammation.  Wrist joints, MCPs, PIPs, DIPs have good range of motion with no synovitis.  CMC joint tenderness bilaterally.  She is able to make a complete fist on

## 2021-11-23 NOTE — Telephone Encounter (Signed)
Patient advised she will need an appointment to discuss the [facility] paperwork. Patient has been scheduled for 11/24/2021 at 2:20 pm..  ?

## 2021-11-23 NOTE — Telephone Encounter (Signed)
Patient left a voicemail requesting a return call with questions about filling out FMLA paperwork. [phone] ?

## 2021-11-24 ENCOUNTER — Ambulatory Visit (INDEPENDENT_AMBULATORY_CARE_PROVIDER_SITE_OTHER): Payer: 59

## 2021-11-24 ENCOUNTER — Ambulatory Visit (INDEPENDENT_AMBULATORY_CARE_PROVIDER_SITE_OTHER): Payer: 59 | Admitting: Physician Assistant

## 2021-11-24 ENCOUNTER — Other Ambulatory Visit: Payer: Self-pay

## 2021-11-24 ENCOUNTER — Encounter: Payer: Self-pay | Admitting: Physician Assistant

## 2021-11-24 VITALS — BP 151/83 | HR 56 | Ht 69.0 in | Wt 279.8 lb

## 2021-11-24 DIAGNOSIS — Z86718 Personal history of other venous thrombosis and embolism: Secondary | ICD-10-CM

## 2021-11-24 DIAGNOSIS — M0579 Rheumatoid arthritis with rheumatoid factor of multiple sites without organ or systems involvement: Secondary | ICD-10-CM

## 2021-11-24 DIAGNOSIS — M79642 Pain in left hand: Secondary | ICD-10-CM | POA: Diagnosis not present

## 2021-11-24 DIAGNOSIS — M19041 Primary osteoarthritis, right hand: Secondary | ICD-10-CM | POA: Diagnosis not present

## 2021-11-24 DIAGNOSIS — M79671 Pain in right foot: Secondary | ICD-10-CM

## 2021-11-24 DIAGNOSIS — M7062 Trochanteric bursitis, left hip: Secondary | ICD-10-CM

## 2021-11-24 DIAGNOSIS — Z79899 Other long term (current) drug therapy: Secondary | ICD-10-CM

## 2021-11-24 DIAGNOSIS — M19072 Primary osteoarthritis, left ankle and foot: Secondary | ICD-10-CM

## 2021-11-24 DIAGNOSIS — I10 Essential (primary) hypertension: Secondary | ICD-10-CM

## 2021-11-24 DIAGNOSIS — M19071 Primary osteoarthritis, right ankle and foot: Secondary | ICD-10-CM | POA: Diagnosis not present

## 2021-11-24 DIAGNOSIS — M170 Bilateral primary osteoarthritis of knee: Secondary | ICD-10-CM

## 2021-11-24 DIAGNOSIS — R768 Other specified abnormal immunological findings in serum: Secondary | ICD-10-CM | POA: Diagnosis not present

## 2021-11-24 DIAGNOSIS — M19042 Primary osteoarthritis, left hand: Secondary | ICD-10-CM

## 2021-11-24 DIAGNOSIS — Z86711 Personal history of pulmonary embolism: Secondary | ICD-10-CM | POA: Diagnosis not present

## 2021-11-24 DIAGNOSIS — E785 Hyperlipidemia, unspecified: Secondary | ICD-10-CM

## 2021-11-24 DIAGNOSIS — M79644 Pain in right finger(s): Secondary | ICD-10-CM

## 2021-11-24 DIAGNOSIS — M79672 Pain in left foot: Secondary | ICD-10-CM

## 2021-11-24 DIAGNOSIS — I493 Ventricular premature depolarization: Secondary | ICD-10-CM

## 2021-11-24 DIAGNOSIS — M7061 Trochanteric bursitis, right hip: Secondary | ICD-10-CM | POA: Diagnosis not present

## 2021-11-24 DIAGNOSIS — Z862 Personal history of diseases of the blood and blood-forming organs and certain disorders involving the immune mechanism: Secondary | ICD-10-CM

## 2021-11-24 DIAGNOSIS — M79641 Pain in right hand: Secondary | ICD-10-CM

## 2021-11-24 DIAGNOSIS — Z8679 Personal history of other diseases of the circulatory system: Secondary | ICD-10-CM

## 2021-11-24 NOTE — Progress Notes (Signed)
X-rays of both feet consistent with osteoarthritis changes with no radiographic progression since 2020.

## 2021-11-24 NOTE — Progress Notes (Signed)
X-rays of both hands are consistent with early osteoarthritic changes.  No erosive changes noted.  No radiographic progression since 2020 noted. Please notify the patient.

## 2021-11-26 ENCOUNTER — Telehealth: Payer: Self-pay | Admitting: Rheumatology

## 2021-11-26 NOTE — Telephone Encounter (Signed)
Patient called the office checking to see if we received her FMLA paper work. Patient advised to have it refaxed since we had not received it yet. Patient states she will call and check to see if we have received it and would like a return  call letting her know when we have received it.  ?

## 2021-11-30 ENCOUNTER — Other Ambulatory Visit (HOSPITAL_COMMUNITY): Payer: Self-pay

## 2021-11-30 NOTE — Telephone Encounter (Signed)
Attempted to contact the patient and left message for patient to advise we still have not received the [facility] paperwork. Advised patient if she has a copy and would like to e-mail it then contact the office and I would provide my e-mail address to her.  ?

## 2021-12-28 ENCOUNTER — Other Ambulatory Visit (HOSPITAL_COMMUNITY): Payer: Self-pay

## 2021-12-31 ENCOUNTER — Encounter: Payer: Self-pay | Admitting: Internal Medicine

## 2021-12-31 ENCOUNTER — Ambulatory Visit: Payer: 59 | Admitting: Internal Medicine

## 2021-12-31 ENCOUNTER — Other Ambulatory Visit (HOSPITAL_COMMUNITY): Payer: Self-pay

## 2021-12-31 VITALS — BP 132/82 | HR 69 | Temp 97.7°F | Resp 16 | Ht 69.0 in | Wt 283.0 lb

## 2021-12-31 DIAGNOSIS — N3281 Overactive bladder: Secondary | ICD-10-CM | POA: Diagnosis not present

## 2021-12-31 DIAGNOSIS — I10 Essential (primary) hypertension: Secondary | ICD-10-CM

## 2021-12-31 LAB — URINALYSIS, ROUTINE W REFLEX MICROSCOPIC
Bilirubin Urine: NEGATIVE
Hgb urine dipstick: NEGATIVE
Ketones, ur: NEGATIVE
Leukocytes,Ua: NEGATIVE
Nitrite: NEGATIVE
RBC / HPF: NONE SEEN (ref 0–?)
Specific Gravity, Urine: >=1.03 — AB (ref 1.000–1.030)
Total Protein, Urine: NEGATIVE
Urine Glucose: NEGATIVE
Urobilinogen, UA: 0.2 (ref 0.0–1.0)
pH: 5.5 (ref 5.0–8.0)

## 2021-12-31 MED ORDER — MIRABEGRON ER 50 MG PO TB24
50.0000 mg | ORAL_TABLET | Freq: Every day | ORAL | 1 refills | Status: DC
  Filled 2021-12-31: qty 90, 90d supply, fill #0
  Filled 2022-03-30: qty 90, 90d supply, fill #1

## 2021-12-31 MED ORDER — INDAPAMIDE 1.25 MG PO TABS
1.2500 mg | ORAL_TABLET | Freq: Every day | ORAL | 1 refills | Status: DC
  Filled 2021-12-31: qty 90, 90d supply, fill #0
  Filled 2022-03-30: qty 90, 90d supply, fill #1

## 2021-12-31 NOTE — Progress Notes (Signed)
? ?Subjective:  ?Patient ID: Pamela Davenport, female    DOB: 21-Apr-1956  Age: 66 y.o. MRN: [id number] ? ?CC: Hypertension ? ? ?HPI ?Diamantina Providence presents for f/up - ? ?She was recently treated for bacterial cystitis.  She continues to complain of urinary frequency, urgency, and leaking spells.  She denies abdominopelvic pain, dysuria, or hematuria. ? ?Outpatient Medications Prior to Visit  ?Medication Sig Dispense Refill  ? flecainide (TAMBOCOR) 150 MG tablet Take 2 tablets by mouth at the onset of AFIB 30 tablet 3  ? hydroxychloroquine (PLAQUENIL) 200 MG tablet TAKE 1 TABLET BY MOUTH 2 TIMES DAILY 180 tablet 0  ? losartan (COZAAR) 100 MG tablet Take 1 tablet by mouth daily. 90 tablet 3  ? nadolol (CORGARD) 20 MG tablet TAKE 1 TABLET BY MOUTH ONCE A DAY 90 tablet 3  ? nystatin-triamcinolone ointment (MYCOLOG) Apply to affected areas two times a day 20 g 2  ? Omega-3 Fatty Acids (OMEGA 3 PO) Take by mouth daily.    ? rivaroxaban (XARELTO) 20 MG TABS tablet TAKE 1 TABLET BY MOUTH DAILY WITH SUPPER 90 tablet 3  ? TART CHERRY PO Take by mouth daily.    ? indapamide (LOZOL) 1.25 MG tablet Take 1 tablet (1.25 mg total) by mouth daily. 90 tablet 1  ? Ferric Maltol (ACCRUFER) 30 MG CAPS Take 1 capsule by mouth in the morning and at bedtime. (Patient not taking: Reported on 12/31/2021) 180 capsule 1  ? nitrofurantoin, macrocrystal-monohydrate, (MACROBID) 100 MG capsule Take 1 capsule by mouth 2  times daily. (Patient not taking: Reported on 11/24/2021) 10 capsule 0  ? phenazopyridine (PYRIDIUM) 100 MG tablet Take 1 tablet  by mouth 2 times daily as needed for pain. (Patient not taking: Reported on 11/24/2021) 30 tablet 0  ? TURMERIC PO Take by mouth daily. (Patient not taking: Reported on 11/24/2021)    ? ?No facility-administered medications prior to visit.  ? ? ?ROS ?Review of Systems  ?Constitutional: Negative.  Negative for diaphoresis and fatigue.  ?HENT: Negative.    ?Eyes: Negative.   ?Respiratory:  Negative for cough,  chest tightness, shortness of breath and wheezing.   ?Cardiovascular:  Negative for chest pain, palpitations and leg swelling.  ?Gastrointestinal:  Negative for abdominal pain, constipation, diarrhea, nausea and vomiting.  ?Endocrine: Negative for polydipsia, polyphagia and polyuria.  ?Genitourinary:  Positive for frequency and urgency. Negative for decreased urine volume, difficulty urinating, dysuria, enuresis and pelvic pain.  ?Musculoskeletal:  Positive for arthralgias. Negative for myalgias.  ?Skin: Negative.   ?Neurological:  Negative for dizziness, weakness, light-headedness and headaches.  ?Hematological:  Negative for adenopathy. Does not bruise/bleed easily.  ?Psychiatric/Behavioral: Negative.    ? ?Objective:  ?BP 132/82 (BP Location: Left Arm, Patient Position: Sitting, Cuff Size: Large)   Pulse 69   Temp 97.7 ?F (36.5 ?C) (Oral)   Resp 16   Ht '5\' 9"'$  (1.753 m)   Wt 283 lb (128.4 kg)   SpO2 97%   BMI 41.79 kg/m?  ? ?BP Readings from Last 3 Encounters:  ?12/31/21 132/82  ?11/24/21 (!) 151/83  ?11/19/21 136/82  ? ? ?Wt Readings from Last 3 Encounters:  ?12/31/21 283 lb (128.4 kg)  ?11/24/21 279 lb 12.8 oz (126.9 kg)  ?11/19/21 276 lb 6 oz (125.4 kg)  ? ? ?Physical Exam ?Vitals reviewed.  ?Constitutional:   ?   Appearance: Normal appearance.  ?HENT:  ?   Nose: Nose normal.  ?   Mouth/Throat:  ?   Mouth: Mucous  membranes are moist.  ?Eyes:  ?   General: No scleral icterus. ?   Conjunctiva/sclera: Conjunctivae normal.  ?Cardiovascular:  ?   Rate and Rhythm: Normal rate and regular rhythm.  ?   Heart sounds: No murmur heard. ?Pulmonary:  ?   Effort: Pulmonary effort is normal.  ?   Breath sounds: No stridor. No wheezing, rhonchi or rales.  ?Abdominal:  ?   General: Abdomen is flat.  ?   Palpations: There is no mass.  ?   Tenderness: There is no abdominal tenderness. There is no guarding.  ?   Hernia: No hernia is present.  ?Musculoskeletal:     ?   General: Normal range of motion.  ?   Cervical back:  Neck supple.  ?   Right lower leg: No edema.  ?   Left lower leg: No edema.  ?Lymphadenopathy:  ?   Cervical: No cervical adenopathy.  ?Skin: ?   General: Skin is warm and dry.  ?Neurological:  ?   General: No focal deficit present.  ?   Mental Status: She is alert.  ?Psychiatric:     ?   Mood and Affect: Mood normal.     ?   Behavior: Behavior normal.  ? ? ?Lab Results  ?Component Value Date  ? WBC 7.1 11/19/2021  ? HGB 12.0 11/19/2021  ? HCT 36.3 11/19/2021  ? PLT 242.0 11/19/2021  ? GLUCOSE 95 11/19/2021  ? CHOL 158 08/27/2021  ? TRIG 59.0 08/27/2021  ? HDL 45.80 08/27/2021  ? LDLCALC 101 (H) 08/27/2021  ? ALT 10 11/19/2021  ? AST 16 11/19/2021  ? NA 137 11/19/2021  ? K 4.7 11/19/2021  ? CL 103 11/19/2021  ? CREATININE 1.10 11/19/2021  ? BUN 19 11/19/2021  ? CO2 29 11/19/2021  ? TSH 1.91 08/26/2021  ? INR 3.1 04/26/2013  ? ? ?US BREAST LTD UNI LEFT INC AXILLA ? ?Result Date: 11/02/2021 ?CLINICAL DATA:  Screening recall for a possible left breast mass. EXAM: DIGITAL DIAGNOSTIC UNILATERAL LEFT MAMMOGRAM WITH TOMOSYNTHESIS AND CAD; ULTRASOUND LEFT BREAST LIMITED TECHNIQUE: Left digital diagnostic mammography and breast tomosynthesis was performed. The images were evaluated with computer-aided detection.; Targeted ultrasound examination of the left breast was performed. COMPARISON:  Previous exam(s). ACR Breast Density Category b: There are scattered areas of fibroglandular density. FINDINGS: The possible mass, noted along the medial aspect of the left breast on the screening exam, persists on the spot compression images as a small oval mass with partly irregular margins. Targeted ultrasound is performed, showing a small oval hypoechoic mass with mostly circumscribed margins in the left breast at 8 o'clock, 5 cm the nipple, measuring 5 x 3 x 4 mm, consistent in size, shape and location to the mammographic mass. There is also a second mass, closer to the nipple, at 8 o'clock, 1 cm from the nipple, and has a cystic  appearance, overall oval, but with mostly ill-defined margins. This measures 8 x 5 x 7 mm, not definitively seen on mammography, but corresponding to an area fibroglandular density in the medial left breast that is stable from the 2016 exam. IMPRESSION: 1. Probably benign small mass in the medial left breast at 8 o'clock, 5 cm the nipple. This is most likely a complicated cyst. 2. Second sonographically visible cystic mass, likely focal fibrocystic change, at 8 o'clock, 1 cm from the nipple is also a probably benign finding. This corresponds to an area stable mammographic density. RECOMMENDATION: 1. Diagnostic left breast mammography and  ultrasound in 6 months, to reassess the small mammographic and presumed correlate sonographic mass at 8 o'clock, 5 cm the nipple, as well as the fibrocystic appearing lesion seen sonographically at 8 o'clock, 1 cm from the nipple. I have discussed the findings and recommendations with the patient. If applicable, a reminder letter will be sent to the patient regarding the next appointment. BI-RADS CATEGORY  3: Probably benign. Electronically Signed   By: Lajean Manes M.D.   On: 11/02/2021 10:13 ? ?MM DIAG BREAST TOMO UNI LEFT ? ?Result Date: 11/02/2021 ?CLINICAL DATA:  Screening recall for a possible left breast mass. EXAM: DIGITAL DIAGNOSTIC UNILATERAL LEFT MAMMOGRAM WITH TOMOSYNTHESIS AND CAD; ULTRASOUND LEFT BREAST LIMITED TECHNIQUE: Left digital diagnostic mammography and breast tomosynthesis was performed. The images were evaluated with computer-aided detection.; Targeted ultrasound examination of the left breast was performed. COMPARISON:  Previous exam(s). ACR Breast Density Category b: There are scattered areas of fibroglandular density. FINDINGS: The possible mass, noted along the medial aspect of the left breast on the screening exam, persists on the spot compression images as a small oval mass with partly irregular margins. Targeted ultrasound is performed, showing a small  oval hypoechoic mass with mostly circumscribed margins in the left breast at 8 o'clock, 5 cm the nipple, measuring 5 x 3 x 4 mm, consistent in size, shape and location to the mammographic mass. There is also a second

## 2021-12-31 NOTE — Patient Instructions (Signed)

## 2022-01-05 ENCOUNTER — Other Ambulatory Visit (HOSPITAL_COMMUNITY): Payer: Self-pay

## 2022-01-07 ENCOUNTER — Encounter: Payer: Self-pay | Admitting: Internal Medicine

## 2022-01-07 DIAGNOSIS — N9089 Other specified noninflammatory disorders of vulva and perineum: Secondary | ICD-10-CM | POA: Diagnosis not present

## 2022-02-03 ENCOUNTER — Other Ambulatory Visit: Payer: Self-pay | Admitting: Physician Assistant

## 2022-02-03 ENCOUNTER — Ambulatory Visit
Admission: EM | Admit: 2022-02-03 | Discharge: 2022-02-03 | Disposition: A | Discharge status: Home / Self Care | Payer: 59 | Attending: Emergency Medicine | Admitting: Emergency Medicine

## 2022-02-03 ENCOUNTER — Other Ambulatory Visit (HOSPITAL_COMMUNITY): Payer: Self-pay

## 2022-02-03 DIAGNOSIS — L02419 Cutaneous abscess of limb, unspecified: Secondary | ICD-10-CM

## 2022-02-03 DIAGNOSIS — Z79899 Other long term (current) drug therapy: Secondary | ICD-10-CM

## 2022-02-03 DIAGNOSIS — M0579 Rheumatoid arthritis with rheumatoid factor of multiple sites without organ or systems involvement: Secondary | ICD-10-CM

## 2022-02-03 DIAGNOSIS — L03119 Cellulitis of unspecified part of limb: Secondary | ICD-10-CM | POA: Diagnosis not present

## 2022-02-03 MED ORDER — AMOXICILLIN-POT CLAVULANATE 875-125 MG PO TABS
1.0000 | ORAL_TABLET | Freq: Two times a day (BID) | ORAL | 0 refills | Status: AC
  Filled 2022-02-03: qty 14, 7d supply, fill #0

## 2022-02-03 NOTE — ED Provider Notes (Signed)
[facility]    CSN: [id number] Arrival date & time: 02/03/22  1520    HISTORY   Chief Complaint  Patient presents with   Abscess   HPI Pamela Davenport is a 66 y.o. female. Patient complains of recurrent episodes of folliculitis on her upper thighs.  Patient states they usually do refill resolve on their own however this 1 in particular that appeared 4 days ago is the largest when she is never had.  Patient apparently deroofed the lesion herself and stated there was a significant amount of drainage from the lesion.  Patient states that since then, she has noticed surrounding redness, tenderness and mild induration.  Patient states she felt uncomfortable with the location being so close to her groin so she decided to come in to see if she needed antibiotics.  The history is provided by the patient.  Past Medical History:  Diagnosis Date   Atypical chest pain    normal myoview 11/11/10   Blood transfusion without reported diagnosis    DVT (deep venous thrombosis) (HCC)    recurrent; chronically anticoagulated with coumadin   Family history of anesthesia complication    Sister had decrease in respirations after surgery   Heart murmur    Herniated lumbar intervertebral disc    Hypertension    Paroxysmal atrial fibrillation ([place])    SINCE AGE 17    PE (pulmonary embolism)    chronically anticoagulated with coumadin   Pneumonia 07/20/12   PONV (postoperative nausea and vomiting)    Rheumatoid arthritis (HCC)    Sickle cell trait (HCC)    Sinus congestion 08/14/2018   C/O NASAL/CHEST  CONGESTION, COUGHING WITH PHLEGM, PER PATIENT , SHE IS TO BEGIN TAKING AUGMENTIN DOSE PACK TODAY;     Strep throat at age 42   The patient reports a severe strep throat infection at age 50 and   is not clear as to whether or not she may have had rheumatic fever.   Symptomatic premature ventricular contractions    improved s/p ablation   Urgency of urination    Uterine bleeding 2008    Uterine artery embolization in 2008 for uterine bleeding.    Patient Active Problem List   Diagnosis Date Noted   OAB (overactive bladder) 12/31/2021   URI with cough and congestion 10/01/2021   Encounter for general adult medical examination with abnormal findings 08/26/2021   Obesity, morbid, BMI 40.0-49.9 ([place]) 08/26/2021   PAF (paroxysmal atrial fibrillation) ([place]) 08/26/2021   Need for vaccination 08/26/2021   GERD without esophagitis 10/04/2019   Vitamin D deficiency disease 09/25/2019   Hypercoagulable state ([place]) 09/24/2019   Iron deficiency anemia 12/07/2018   Other dietary vitamin B12 deficiency anemia 12/07/2018   De Quervain's tenosynovitis 03/01/2018   Visit for screening mammogram 04/18/2017   Hyperlipidemia with target LDL less than 130 10/19/2016   Routine general medical examination at a health care facility 02/03/2015   Essential hypertension 01/13/2015   Chronic pulmonary embolism ([place]) 12/05/2008   Past Surgical History:  Procedure Laterality Date   COLONOSCOPY W/ POLYPECTOMY     Diagnostic D&C hysteroscopy and Novasure ablation  03/17/2004   DILATION AND CURETTAGE OF UTERUS  12/17/2011   Procedure: DILATATION AND CURETTAGE;  Surgeon: Frederico Hamman, MD;  Location: [facility];  Service: Gynecology;  Laterality: N/A;  With Attempted Hydrothermal Ablation   LUMBAR LAMINECTOMY/DECOMPRESSION MICRODISCECTOMY Left 02/21/2013   Procedure: LUMBAR LAMINECTOMY/DECOMPRESSION MICRODISCECTOMY 1 LEVEL;  Surgeon: Winfield Cunas, MD;  Location: [facility];  Service: Neurosurgery;  Laterality: Left;  LEFT Lumbar Three-Four Laminotomy foraminotomy microdiskectomy   MANDIBLE SURGERY     under bite   PVC Ablation  2010   ROBOTIC ASSISTED TOTAL HYSTERECTOMY WITH BILATERAL SALPINGO OOPHERECTOMY Bilateral 08/21/2018   Procedure: XI ROBOTIC ASSISTED TOTAL HYSTERECTOMY WITH BILATERAL SALPINGO OOPHORECTOMY;  Surgeon: Everitt Amber, MD;  Location: [facility];  Service:  Gynecology;  Laterality: Bilateral;   SPINE SURGERY     TONSILLECTOMY     UTERINE ARTERY EMBOLIZATION  2008   for uterine bleeding   VASCULAR SURGERY     laser of left leg   OB History   No obstetric history on file.    Home Medications    Prior to Admission medications   Medication Sig Start Date End Date Taking? Authorizing Provider  amoxicillin-clavulanate (AUGMENTIN) 875-125 MG tablet Take 1 tablet by mouth every 12 (twelve) hours for 7 days. 02/03/22 02/10/22 Yes Lynden Oxford Scales, PA-C  flecainide (TAMBOCOR) 150 MG tablet Take 2 tablets by mouth at the onset of AFIB 06/22/21   Shirley Friar, PA-C  hydroxychloroquine (PLAQUENIL) 200 MG tablet TAKE 1 TABLET BY MOUTH 2 TIMES DAILY 09/28/21 09/28/22  Ofilia Neas, PA-C  indapamide (LOZOL) 1.25 MG tablet Take 1 tablet (1.25 mg total) by mouth daily. 12/31/21   Janith Lima, MD  losartan (COZAAR) 100 MG tablet Take 1 tablet by mouth daily. 06/22/21 06/22/22  Shirley Friar, PA-C  mirabegron ER (MYRBETRIQ) 50 MG TB24 tablet Take 1 tablet (50 mg total) by mouth daily. 12/31/21   Janith Lima, MD  nadolol (CORGARD) 20 MG tablet TAKE 1 TABLET BY MOUTH ONCE A DAY 07/10/21 07/10/22  Shirley Friar, PA-C  nystatin-triamcinolone ointment [facility]) Apply to affected areas two times a day 08/09/21     Omega-3 Fatty Acids (OMEGA 3 PO) Take by mouth daily.    [provider]  rivaroxaban (XARELTO) 20 MG TABS tablet TAKE 1 TABLET BY MOUTH DAILY WITH SUPPER 06/22/21 06/22/22  Shirley Friar, PA-C  TART CHERRY PO Take by mouth daily.    [provider]    Family History Family History  Problem Relation Age of Onset   Hypertension Mother    Diabetes Mother    Uterine cancer Sister    Prostate cancer Paternal Grandmother    Colon cancer Paternal Grandfather    Prostate cancer Brother    Prostate cancer Brother    Prostate cancer Brother    Healthy Son    Breast cancer Neg Hx     Social History Social History   Tobacco Use   Smoking status: Former    Packs/day: 1.00    Years: 2.00    Pack years: 2.00    Types: Cigarettes    Quit date: 02/13/1979    Years since quitting: 43.0    Passive exposure: Never   Smokeless tobacco: Never  Vaping Use   Vaping Use: Never used  Substance Use Topics   Alcohol use: Yes    Comment: rarely   Drug use: No   Allergies   Methylprednisolone, Sulfa antibiotics, Sulfonamide derivatives, and Macrobid [nitrofurantoin]  Review of Systems Review of Systems Pertinent findings noted in history of present illness.   Physical Exam Triage Vital Signs ED Triage Vitals  Enc Vitals Group     BP 06/26/21 0827 (!) 147/82     Pulse Rate 06/26/21 0827 72     Resp 06/26/21 0827 18  Temp 06/26/21 0827 98.3 F (36.8 C)     Temp Source 06/26/21 0827 Oral     SpO2 06/26/21 0827 98 %     Weight --      Height --      Head Circumference --      Peak Flow --      Pain Score 06/26/21 0826 5     Pain Loc --      Pain Edu? --      Excl. in [place]? --   No data found.  Updated Vital Signs BP 137/84 (BP Location: Left Arm)   Pulse 74   Temp 98.6 F (37 C) (Oral)   Resp 18   SpO2 98%   Physical Exam Vitals and nursing note reviewed.  Constitutional:      General: She is not in acute distress.    Appearance: Normal appearance. She is obese. She is not ill-appearing.  HENT:     Head: Normocephalic and atraumatic.  Eyes:     General: Lids are normal.        Right eye: No discharge.        Left eye: No discharge.     Extraocular Movements: Extraocular movements intact.     Conjunctiva/sclera: Conjunctivae normal.     Right eye: Right conjunctiva is not injected.     Left eye: Left conjunctiva is not injected.  Neck:     Trachea: Trachea and phonation normal.  Cardiovascular:     Rate and Rhythm: Normal rate and regular rhythm.     Pulses: Normal pulses.     Heart sounds: Normal heart sounds. No murmur heard.   No  friction rub. No gallop.  Pulmonary:     Effort: Pulmonary effort is normal. No accessory muscle usage, prolonged expiration or respiratory distress.     Breath sounds: Normal breath sounds. No stridor, decreased air movement or transmitted upper airway sounds. No decreased breath sounds, wheezing, rhonchi or rales.  Chest:     Chest wall: No tenderness.  Musculoskeletal:        General: Normal range of motion.     Cervical back: Normal range of motion and neck supple. Normal range of motion.  Lymphadenopathy:     Cervical: No cervical adenopathy.  Skin:    General: Skin is warm and dry.     Findings: Lesion (0.5 cm deroofed abscess right upper thigh lateral to groin with scant drainage and mild surrounding erythema with patient reported TTP.) present. No erythema or rash.  Neurological:     General: No focal deficit present.     Mental Status: She is alert and oriented to person, place, and time.  Psychiatric:        Mood and Affect: Mood normal.        Behavior: Behavior normal.    Visual Acuity Right Eye Distance:   Left Eye Distance:   Bilateral Distance:    Right Eye Near:   Left Eye Near:    Bilateral Near:     UC Couse / Diagnostics / Procedures:    EKG  Radiology No results found.  Procedures Procedures (including critical care time)  UC Diagnoses / Final Clinical Impressions(s)   I have reviewed the triage vital signs and the nursing notes.  Pertinent labs & imaging results that were available during my care of the patient were reviewed by me and considered in my medical decision making (see chart for details).    Final diagnoses:  Cellulitis and  abscess of leg   Patient provided with a prescription for Augmentin and advised to take for 5 days.  Patient advised that I provided her with a 7-day prescription in the event she feels that she needs 2 more days.  Patient advised to closely monitor surrounding erythema redness and tenderness and if these do not  improve or worsen while taking Augmentin she should go to the emergency room for wound culture and directed antibiotic therapy.  ED Prescriptions     Medication Sig Dispense Auth. Provider   amoxicillin-clavulanate (AUGMENTIN) 875-125 MG tablet Take 1 tablet by mouth every 12 (twelve) hours for 7 days. 14 tablet Lynden Oxford Scales, PA-C      PDMP not reviewed this encounter.  Pending results:  Labs Reviewed - No data to display  Medications Ordered in UC: Medications - No data to display  Disposition Upon Discharge:  Condition: stable for discharge home Home: take medications as prescribed; routine discharge instructions as discussed; follow up as advised.  Patient presented with an acute illness with associated systemic symptoms and significant discomfort requiring urgent management. In my opinion, this is a condition that a prudent lay person (someone who possesses an average knowledge of health and medicine) may potentially expect to result in complications if not addressed urgently such as respiratory distress, impairment of bodily function or dysfunction of bodily organs.   Routine symptom specific, illness specific and/or disease specific instructions were discussed with the patient and/or caregiver at length.   As such, the patient has been evaluated and assessed, work-up was performed and treatment was provided in alignment with urgent care protocols and evidence based medicine.  Patient/parent/caregiver has been advised that the patient may require follow up for further testing and treatment if the symptoms continue in spite of treatment, as clinically indicated and appropriate.  Patient/parent/caregiver has been advised to return to the [facility] or PCP if no better; to PCP or the Emergency Department if new signs and symptoms develop, or if the current signs or symptoms continue to change or worsen for further workup, evaluation and treatment as clinically indicated and  appropriate  The patient will follow up with their current PCP if and as advised. If the patient does not currently have a PCP we will assist them in obtaining one.   The patient may need specialty follow up if the symptoms continue, in spite of conservative treatment and management, for further workup, evaluation, consultation and treatment as clinically indicated and appropriate.   Patient/parent/caregiver verbalized understanding and agreement of plan as discussed.  All questions were addressed during visit.  Please see discharge instructions below for further details of plan.  Discharge Instructions:   Discharge Instructions      Please begin Augmentin, 1 tablet every 12 hours for the next 7 days for coverage of abscess and cellulitis on your right upper leg.  Please continue to monitor the area for red streaking, pain and swelling.  If these persist or worsen while you are taking Augmentin, please report to the emergency room for further evaluation.  We are unable to perform wound cultures here at urgent care.  Thank you for visiting urgent care today.    This office note has been dictated using Museum/gallery curator.  Unfortunately, and despite my best efforts, this method of dictation can sometimes lead to occasional typographical or grammatical errors.  I apologize in advance if this occurs.     Lynden Oxford Scales, PA-C 02/03/22 [phone]

## 2022-02-03 NOTE — Telephone Encounter (Signed)
Next Visit: 04/27/2022  Last Visit: 11/24/2021  Labs: 11/19/2021 GFR 52.70  Eye exam:  03/09/2021 WNL    Current Dose per office note 11/24/2021: Plaquenil 200 mg 1 tablet by mouth twice daily  DX: Rheumatoid arthritis with rheumatoid factor of multiple sites without organ or systems involvement   Last Fill: 09/28/2021  Okay to refill Plaquenil?

## 2022-02-03 NOTE — Discharge Instructions (Signed)
Please begin Augmentin, 1 tablet every 12 hours for the next 7 days for coverage of abscess and cellulitis on your right upper leg.  Please continue to monitor the area for red streaking, pain and swelling.  If these persist or worsen while you are taking Augmentin, please report to the emergency room for further evaluation.  We are unable to perform wound cultures here at urgent care.  Thank you for visiting urgent care today.

## 2022-02-03 NOTE — ED Triage Notes (Signed)
Pt c/o ruptured abscess to right thigh that is spreading to her groin.   Started: this weekend

## 2022-02-04 ENCOUNTER — Telehealth: Payer: Self-pay | Admitting: Rheumatology

## 2022-02-04 ENCOUNTER — Other Ambulatory Visit (HOSPITAL_COMMUNITY): Payer: Self-pay

## 2022-02-04 MED ORDER — HYDROXYCHLOROQUINE SULFATE 200 MG PO TABS
ORAL_TABLET | ORAL | 0 refills | Status: DC
  Filled 2022-02-04: qty 120, 84d supply, fill #0

## 2022-02-04 NOTE — Telephone Encounter (Signed)
See rx note for details.  °

## 2022-02-04 NOTE — Telephone Encounter (Signed)
Patient left a voicemail requesting prescription refill of Hydroxychloroquine to be sent to [facility].

## 2022-02-04 NOTE — Telephone Encounter (Signed)
Attempted to contact the patient and left message for patient to call the office.  

## 2022-02-04 NOTE — Telephone Encounter (Signed)
GFR was low in March.  Please advise her to get repeat CMP.  Please advise patient to reduce hydroxychloroquine to 1 tablet p.o. twice daily Monday to Friday.  Please change the prescription accordingly.

## 2022-03-08 ENCOUNTER — Ambulatory Visit: Payer: 59 | Admitting: Physician Assistant

## 2022-03-26 ENCOUNTER — Telehealth: Payer: Self-pay

## 2022-03-26 NOTE — Telephone Encounter (Signed)
Pt is asking that a GI referral be sent to [facility] for Dr. Lear Ng [address] [place], [place] [address] Call us [phone] Our Hours Monday-Friday 8am-5pm

## 2022-03-27 ENCOUNTER — Other Ambulatory Visit: Payer: Self-pay | Admitting: Internal Medicine

## 2022-03-27 DIAGNOSIS — D509 Iron deficiency anemia, unspecified: Secondary | ICD-10-CM

## 2022-03-27 DIAGNOSIS — K219 Gastro-esophageal reflux disease without esophagitis: Secondary | ICD-10-CM

## 2022-03-30 ENCOUNTER — Other Ambulatory Visit (HOSPITAL_COMMUNITY): Payer: Self-pay

## 2022-04-04 NOTE — Progress Notes (Unsigned)
Cardiology Office Note Date:  04/07/2022  Patient ID:  Pamela, Davenport 18-Jun-1956, MRN [id number] PCP:  Janith Lima, MD  Electrophysiologist: Dr. Rayann Heman    Chief Complaint:  6 mo  History of Present Illness: Pamela Davenport is a 66 y.o. female with history of recurrent DVT/PE, HTN, AFib, PVCs, RA  She comes in today to be seen for Dr. Rayann Heman, last seen by him Nov 2021, doing well on nadolol and PRN flecainide.  Bot Afib and PVCs described as well controlled. Maintained on Xarelto.  Since then she has seen A. Tillery, PA-C a couple times, most recently 07/10/2021 o follow up on recent symptoms it seems perhaps particularly since having COVID in Aug 2022 Had not needed to use PRN flecainide I years, CP felt mostly atypical sounding though planned on stress test and echo  Echo noted preserved LVEF, no WMA , mild LVH, RV was OK, no significant VHD Stress test was low risk with no ischemia/infarct  TODAY She does not think she has had any Afib She is aware of her PVCs,  they flare up infrequently and will have a few then it settles.  No sustained palpitations. She has trouble with irritable bladder that very much interrupts her sleep. She took Myrbetriq previously but seemed to bother her BP and flare up her PVCs, and was stopped though her PMD did not think was the medicine. Bladder symptoms became problematic again and unfortunately her insurance would not cover an alternative and back on myrbetriq.  She again had more then usual PVCs and once her BP got 201/120 and had some L facial numbness transiently.  She was urged by family to go to the ER but she elected to monitor and it did finally settle down  And she again has stopped it.  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    NO CP, no SOB  She has not used flecainide in at least a year or longer   Past Medical History:  Diagnosis Date   Atypical chest pain    normal myoview  11/11/10   Blood transfusion without reported diagnosis    DVT (deep venous thrombosis) (HCC)    recurrent; chronically anticoagulated with coumadin   Family history of anesthesia complication    Sister had decrease in respirations after surgery   Heart murmur    Herniated lumbar intervertebral disc    Hypertension    Paroxysmal atrial fibrillation ([place])    SINCE AGE 70    PE (pulmonary embolism)    chronically anticoagulated with coumadin   Pneumonia 07/20/12   PONV (postoperative nausea and vomiting)    Rheumatoid arthritis (HCC)    Sickle cell trait (HCC)    Sinus congestion 08/14/2018   C/O NASAL/CHEST  CONGESTION, COUGHING WITH PHLEGM, PER PATIENT , SHE IS TO BEGIN TAKING AUGMENTIN DOSE PACK TODAY;     Strep throat at age 18   The patient reports a severe strep throat infection at age 41 and   is not clear as to whether or not she may have had rheumatic fever.   Symptomatic premature ventricular contractions    improved s/p ablation   Urgency of urination    Uterine bleeding 2008   Uterine artery embolization in 2008 for uterine bleeding.     Past Surgical History:  Procedure Laterality Date   COLONOSCOPY W/ POLYPECTOMY     Diagnostic D&C hysteroscopy and Novasure ablation  03/17/2004  DILATION AND CURETTAGE OF UTERUS  12/17/2011   Procedure: DILATATION AND CURETTAGE;  Surgeon: Frederico Hamman, MD;  Location: [facility];  Service: Gynecology;  Laterality: N/A;  With Attempted Hydrothermal Ablation   LUMBAR LAMINECTOMY/DECOMPRESSION MICRODISCECTOMY Left 02/21/2013   Procedure: LUMBAR LAMINECTOMY/DECOMPRESSION MICRODISCECTOMY 1 LEVEL;  Surgeon: Winfield Cunas, MD;  Location: [facility];  Service: Neurosurgery;  Laterality: Left;  LEFT Lumbar Three-Four Laminotomy foraminotomy microdiskectomy   MANDIBLE SURGERY     under bite   PVC Ablation  2010   ROBOTIC ASSISTED TOTAL HYSTERECTOMY WITH BILATERAL SALPINGO OOPHERECTOMY Bilateral 08/21/2018   Procedure: XI ROBOTIC ASSISTED  TOTAL HYSTERECTOMY WITH BILATERAL SALPINGO OOPHORECTOMY;  Surgeon: Everitt Amber, MD;  Location: [facility];  Service: Gynecology;  Laterality: Bilateral;   SPINE SURGERY     TONSILLECTOMY     UTERINE ARTERY EMBOLIZATION  2008   for uterine bleeding   VASCULAR SURGERY     laser of left leg    Current Outpatient Medications  Medication Sig Dispense Refill   flecainide (TAMBOCOR) 150 MG tablet Take 2 tablets by mouth at the onset of AFIB 30 tablet 3   hydroxychloroquine (PLAQUENIL) 200 MG tablet Take one tablet by mouth twice daily Monday-Friday only 120 tablet 0   indapamide (LOZOL) 1.25 MG tablet Take 1 tablet (1.25 mg total) by mouth daily. 90 tablet 1   losartan (COZAAR) 100 MG tablet Take 1 tablet by mouth daily. 90 tablet 3   mirabegron ER (MYRBETRIQ) 50 MG TB24 tablet Take 1 tablet (50 mg total) by mouth daily. 90 tablet 1   nadolol (CORGARD) 20 MG tablet TAKE 1 TABLET BY MOUTH ONCE A DAY 90 tablet 3   nystatin-triamcinolone ointment (MYCOLOG) Apply to affected areas two times a day 20 g 2   Omega-3 Fatty Acids (OMEGA 3 PO) Take 1 tablet by mouth daily.     rivaroxaban (XARELTO) 20 MG TABS tablet TAKE 1 TABLET BY MOUTH DAILY WITH SUPPER 90 tablet 3   TART CHERRY PO Take 2 tablets by mouth daily.     No current facility-administered medications for this visit.    Allergies:   Methylprednisolone, Sulfa antibiotics, Sulfonamide derivatives, and Macrobid [nitrofurantoin]   Social History:  The patient  reports that she quit smoking about 43 years ago. Her smoking use included cigarettes. She has a 2.00 pack-year smoking history. She has never been exposed to tobacco smoke. She has never used smokeless tobacco. She reports current alcohol use. She reports that she does not use drugs.   Family History:  The patient's family history includes Colon cancer in her paternal grandfather; Diabetes in her mother; Healthy in her son; Hypertension in her mother; Prostate cancer in her  brother, brother, brother, and paternal grandmother; Uterine cancer in her sister.  ROS:  Please see the history of present illness.    All other systems are reviewed and otherwise negative.   PHYSICAL EXAM:  VS:  BP (!) 142/80   Pulse 70   Ht '5\' 9"'$  (1.753 m)   Wt 281 lb 9.6 oz (127.7 kg)   SpO2 98%   BMI 41.59 kg/m  BMI: Body mass index is 41.59 kg/m. Well nourished, well developed, in no acute distress HEENT: normocephalic, atraumatic Neck: no JVD, carotid bruits or masses Cardiac:  RRR; no significant murmurs, no rubs, or gallops Lungs:  CTA b/l, no wheezing, rhonchi or rales Abd: soft, nontender, obese MS: no deformity or atrophy Ext: no edema Skin: warm and dry, no rash  Neuro:  No gross deficits appreciated Psych: euthymic mood, full affect   EKG:  not done today  08/03/2021: stress myoview   Stress SPECT images were obtained approximately 60 minutes post tracer injection.   The study is normal. The study is low risk.   No ST deviation was noted.   LV perfusion is normal. There is no evidence of ischemia. There is no evidence of infarction.   Left ventricular function is normal. Nuclear stress EF: 57 %. The left ventricular ejection fraction is normal (55-65%). End diastolic cavity size is normal. End systolic cavity size is normal.   Prior study available for comparison from 02/07/2015.   Reduced apical counts with normal wall motion consistent with apical thinning artifact.  Normal study without evidence of ischemia or infarction.  Normal LVEF, 57%.  This is a low-risk study.   08/03/2021: TTE 1. Prominent LV apical band. Left ventricular ejection fraction, by  estimation, is 55 to 60%. The left ventricle has normal function. The left  ventricle has no regional wall motion abnormalities. There is mild left  ventricular hypertrophy. Left  ventricular diastolic parameters were normal.   2. Right ventricular systolic function is normal. The right ventricular  size is  normal. There is normal pulmonary artery systolic pressure.   3. The mitral valve is abnormal. Mild mitral valve regurgitation. No  evidence of mitral stenosis.   4. The aortic valve is normal in structure. Aortic valve regurgitation is  not visualized. No aortic stenosis is present.   5. The inferior vena cava is normal in size with greater than 50%  respiratory variability, suggesting right atrial pressure of 3 mmHg.   Recent Labs: 08/26/2021: TSH 1.91 11/19/2021: ALT 10; BUN 19; Creatinine, Ser 1.10; Hemoglobin 12.0; Platelets 242.0; Potassium 4.7; Sodium 137  08/27/2021: Cholesterol 158; HDL 45.80; LDL Cholesterol 101; Total CHOL/HDL Ratio 3; Triglycerides 59.0; VLDL 11.8   CrCl cannot be calculated (Patient's most recent lab result is older than the maximum 21 days allowed.).   Wt Readings from Last 3 Encounters:  04/07/22 281 lb 9.6 oz (127.7 kg)  12/31/21 283 lb (128.4 kg)  11/24/21 279 lb 12.8 oz (126.9 kg)     Other studies reviewed: Additional studies/records reviewed today include: summarized above  ASSESSMENT AND PLAN:  PVCs occassional Paroxysmal Afib CHA2DS2Vasc is 5, on Xarelto, appropriately dosed Also carries hx of DVT/PE prn flecainide Minimal/no AF burden by symptoms in the last year No changes  HTN Repeat BPs by myself 142/84 left,  140/78 left I have asked her to keep an eye on her BP Discuss alternative to Myrbetriq, when I look it up HTN is common with this Avoid sodium Exercise to her capacity, discussed alternatives to walking given her RA gets to bothering her Weight loss    Disposition: F/u with [place] in 22mo sooner if needed.  Discussed she will  need to transition to another EP MD, will have her meet Dr. LQuentin Oreat her next visit  Current medicines are reviewed at length with the patient today.  The patient did not have any concerns regarding medicines.  SVenetia Night PA-C 04/07/2022 5:18 PM     [place] [phone](office)  [phone](fax)

## 2022-04-05 ENCOUNTER — Other Ambulatory Visit: Payer: Self-pay | Admitting: Rheumatology

## 2022-04-05 ENCOUNTER — Other Ambulatory Visit (HOSPITAL_COMMUNITY): Payer: Self-pay

## 2022-04-05 DIAGNOSIS — M0579 Rheumatoid arthritis with rheumatoid factor of multiple sites without organ or systems involvement: Secondary | ICD-10-CM

## 2022-04-06 ENCOUNTER — Other Ambulatory Visit (HOSPITAL_COMMUNITY): Payer: Self-pay

## 2022-04-07 ENCOUNTER — Encounter: Payer: Self-pay | Admitting: Physician Assistant

## 2022-04-07 ENCOUNTER — Ambulatory Visit: Payer: 59 | Admitting: Physician Assistant

## 2022-04-07 VITALS — BP 142/80 | HR 70 | Ht 69.0 in | Wt 281.6 lb

## 2022-04-07 DIAGNOSIS — I493 Ventricular premature depolarization: Secondary | ICD-10-CM | POA: Diagnosis not present

## 2022-04-07 DIAGNOSIS — I10 Essential (primary) hypertension: Secondary | ICD-10-CM | POA: Diagnosis not present

## 2022-04-07 DIAGNOSIS — I480 Paroxysmal atrial fibrillation: Secondary | ICD-10-CM | POA: Diagnosis not present

## 2022-04-07 NOTE — Patient Instructions (Signed)
Medication Instructions:   Your physician recommends that you continue on your current medications as directed. Please refer to the Current Medication list given to you today.  *If you need a refill on your cardiac medications before your next appointment, please call your pharmacy*   Lab Work: [place]   If you have labs (blood work) drawn today and your tests are completely normal, you will receive your results only by: [place] (if you have MyChart) OR A paper copy in the mail If you have any lab test that is abnormal or we need to change your treatment, we will call you to review the results.   Testing/Procedures: NONE ORDERED  TODAY   Follow-Up: At [facility], you and your health needs are our priority.  As part of our continuing mission to provide you with exceptional heart care, we have created designated Provider Care Teams.  These Care Teams include your primary Cardiologist (physician) and Advanced Practice Providers (APPs -  Physician Assistants and Nurse Practitioners) who all work together to provide you with the care you need, when you need it.  We recommend signing up for the patient portal called "MyChart".  Sign up information is provided on this After Visit Summary.  MyChart is used to connect with patients for Virtual Visits (Telemedicine).  Patients are able to view lab/test results, encounter notes, upcoming appointments, etc.  Non-urgent messages can be sent to your provider as well.   To learn more about what you can do with MyChart, go to [email or web address].    Your next appointment:   6 month(s)  The format for your next appointment:   In Person  Provider:   You may see Dr. Quentin Ore  or one of the following Advanced Practice Providers on your designated Care Team:   Tommye Standard, [place] Legrand Como "Jonni Sanger" Chalmers Cater, [place]   Other Instructions   Important Information About Sugar

## 2022-04-09 ENCOUNTER — Other Ambulatory Visit (HOSPITAL_COMMUNITY): Payer: Self-pay

## 2022-04-10 ENCOUNTER — Other Ambulatory Visit (HOSPITAL_COMMUNITY): Payer: Self-pay

## 2022-04-13 NOTE — Progress Notes (Signed)
Office Visit Note  Patient: Pamela Davenport             Date of Birth: June 22, 1956           MRN: [id number]             PCP: Janith Lima, MD Referring: Janith Lima, MD Visit Date: 04/27/2022 Occupation: '@GUAROCC'$ @  Subjective:  Medication management  History of Present Illness: Pamela Davenport is a 66 y.o. female with history of rheumatoid arthritis and osteoarthritis.  She states she had improvement in her right middle finger after the cortisone injection.  She continues to have some discomfort in her shoulders especially when she lays on the side.  She also complains of discomfort in her bilateral trochanteric bursa.  None of the other joints are painful or swollen.  She has been taking hydroxychloroquine 200 mg p.o. twice daily without any side effects.  She said eye examination in January 2023.  Activities of Daily Living:  Patient reports morning stiffness for 30 minutes.   Patient Reports nocturnal pain.  Difficulty dressing/grooming: Denies Difficulty climbing stairs: Denies Difficulty getting out of chair: Denies Difficulty using hands for taps, buttons, cutlery, and/or writing: Denies  Review of Systems  Constitutional:  Positive for fatigue.  HENT:  Positive for mouth dryness. Negative for mouth sores.   Eyes:  Negative for dryness.  Respiratory:  Negative for shortness of breath.   Cardiovascular:  Negative for chest pain and palpitations.  Gastrointestinal:  Negative for blood in stool, constipation and diarrhea.  Endocrine: Positive for increased urination.  Genitourinary:  Negative for involuntary urination.  Musculoskeletal:  Positive for joint pain, joint pain, joint swelling and morning stiffness. Negative for gait problem, myalgias, muscle weakness, muscle tenderness and myalgias.  Skin:  Negative for color change, hair loss and sensitivity to sunlight.  Allergic/Immunologic: Negative for susceptible to infections.  Neurological:  Positive for headaches.  Negative for dizziness.  Hematological:  Negative for swollen glands.  Psychiatric/Behavioral:  Positive for sleep disturbance. Negative for depressed mood. The patient is nervous/anxious.     PMFS History:  Patient Active Problem List   Diagnosis Date Noted   OAB (overactive bladder) 12/31/2021   URI with cough and congestion 10/01/2021   Encounter for general adult medical examination with abnormal findings 08/26/2021   Obesity, morbid, BMI 40.0-49.9 ([place]) 08/26/2021   PAF (paroxysmal atrial fibrillation) ([place]) 08/26/2021   GERD without esophagitis 10/04/2019   Vitamin D deficiency disease 09/25/2019   Hypercoagulable state ([place]) 09/24/2019   Iron deficiency anemia 12/07/2018   Other dietary vitamin B12 deficiency anemia 12/07/2018   Visit for screening mammogram 04/18/2017   Hyperlipidemia with target LDL less than 130 10/19/2016   Essential hypertension 01/13/2015   Chronic pulmonary embolism ([place]) 12/05/2008    Past Medical History:  Diagnosis Date   Atypical chest pain    normal myoview 11/11/10   Blood transfusion without reported diagnosis    DVT (deep venous thrombosis) (HCC)    recurrent; chronically anticoagulated with coumadin   Family history of anesthesia complication    Sister had decrease in respirations after surgery   Heart murmur    Herniated lumbar intervertebral disc    Hypertension    Paroxysmal atrial fibrillation ([place])    SINCE AGE 52    PE (pulmonary embolism)    chronically anticoagulated with coumadin   Pneumonia 07/20/12   PONV (postoperative nausea and vomiting)    Rheumatoid arthritis ([place])    Sickle cell trait (  [place])    Sinus congestion 08/14/2018   C/O NASAL/CHEST  CONGESTION, COUGHING WITH PHLEGM, PER PATIENT , SHE IS TO BEGIN TAKING AUGMENTIN DOSE PACK TODAY;     Strep throat at age 65   The patient reports a severe strep throat infection at age 3 and   is not clear as to whether or not she may have had rheumatic fever.   Symptomatic  premature ventricular contractions    improved s/p ablation   Urgency of urination    Uterine bleeding 2008   Uterine artery embolization in 2008 for uterine bleeding.     Family History  Problem Relation Age of Onset   Hypertension Mother    Diabetes Mother    Uterine cancer Sister    Prostate cancer Paternal Grandmother    Colon cancer Paternal Grandfather    Prostate cancer Brother    Prostate cancer Brother    Prostate cancer Brother    Healthy Son    Breast cancer Neg Hx    Past Surgical History:  Procedure Laterality Date   COLONOSCOPY W/ POLYPECTOMY     Diagnostic D&C hysteroscopy and Novasure ablation  03/17/2004   DILATION AND CURETTAGE OF UTERUS  12/17/2011   Procedure: DILATATION AND CURETTAGE;  Surgeon: Frederico Hamman, MD;  Location: [facility];  Service: Gynecology;  Laterality: N/A;  With Attempted Hydrothermal Ablation   LUMBAR LAMINECTOMY/DECOMPRESSION MICRODISCECTOMY Left 02/21/2013   Procedure: LUMBAR LAMINECTOMY/DECOMPRESSION MICRODISCECTOMY 1 LEVEL;  Surgeon: Winfield Cunas, MD;  Location: [facility];  Service: Neurosurgery;  Laterality: Left;  LEFT Lumbar Three-Four Laminotomy foraminotomy microdiskectomy   MANDIBLE SURGERY     under bite   PVC Ablation  2010   ROBOTIC ASSISTED TOTAL HYSTERECTOMY WITH BILATERAL SALPINGO OOPHERECTOMY Bilateral 08/21/2018   Procedure: XI ROBOTIC ASSISTED TOTAL HYSTERECTOMY WITH BILATERAL SALPINGO OOPHORECTOMY;  Surgeon: Everitt Amber, MD;  Location: [facility];  Service: Gynecology;  Laterality: Bilateral;   SPINE SURGERY     TONSILLECTOMY     UTERINE ARTERY EMBOLIZATION  2008   for uterine bleeding   VASCULAR SURGERY     laser of left leg   Social History   Social History Narrative   Works as a Marine scientist at [employer] History  Administered Date(s) Administered   Influenza,inj,Quad PF,6+ Mos 06/14/2019   Influenza-Unspecified 05/05/2021   PFIZER(Purple Top)SARS-COV-2 Vaccination  11/08/2019, 11/30/2019, 08/19/2020   PNEUMOCOCCAL CONJUGATE-20 08/26/2021   Tdap 02/03/2015   Zoster Recombinat (Shingrix) 08/26/2021     Objective: Vital Signs: BP 138/81 (BP Location: Left Arm, Patient Position: Sitting, Cuff Size: Large)   Pulse 69   Resp 17   Ht '5\' 9"'$  (1.753 m)   Wt 284 lb 9.6 oz (129.1 kg)   BMI 42.03 kg/m    Physical Exam Vitals and nursing note reviewed.  Constitutional:      Appearance: She is well-developed.  HENT:     Head: Normocephalic and atraumatic.  Eyes:     Conjunctiva/sclera: Conjunctivae normal.  Cardiovascular:     Rate and Rhythm: Normal rate and regular rhythm.     Heart sounds: Normal heart sounds.  Pulmonary:     Effort: Pulmonary effort is normal.     Breath sounds: Normal breath sounds.  Abdominal:     General: Bowel sounds are normal.     Palpations: Abdomen is soft.  Musculoskeletal:     Cervical back: Normal range of motion.  Lymphadenopathy:     Cervical: No cervical adenopathy.  Skin:  General: Skin is warm and dry.     Capillary Refill: Capillary refill takes less than 2 seconds.  Neurological:     Mental Status: She is alert and oriented to person, place, and time.  Psychiatric:        Behavior: Behavior normal.      Musculoskeletal Exam: C-spine was in good range of motion.  She had good range of motion of bilateral shoulders with minimal discomfort.  Elbow joints, wrist joints, MCPs PIPs and DIPs and good range of motion with no synovitis.  Hip joints and knee joints in good range of motion.  She had tenderness on palpation of bilateral trochanteric bursa.  She had no tenderness over ankles or MTPs.  CDAI Exam: CDAI Score: 2.6  Patient Global: 3 mm; Provider Global: 3 mm Swollen: 0 ; Tender: 2  Joint Exam 04/27/2022      Right  Left  Glenohumeral   Tender   Tender     Investigation: No additional findings.  Imaging: No results found.  Recent Labs: Lab Results  Component Value Date   WBC 7.1  11/19/2021   HGB 12.0 11/19/2021   PLT 242.0 11/19/2021   NA 137 11/19/2021   K 4.7 11/19/2021   CL 103 11/19/2021   CO2 29 11/19/2021   GLUCOSE 95 11/19/2021   BUN 19 11/19/2021   CREATININE 1.10 11/19/2021   BILITOT 0.4 11/19/2021   ALKPHOS 77 11/19/2021   AST 16 11/19/2021   ALT 10 11/19/2021   PROT 7.6 11/19/2021   ALBUMIN 4.3 11/19/2021   CALCIUM 9.3 11/19/2021   GFRAA 72 11/13/2020   QFTBGOLDPLUS NEGATIVE 01/15/2019    Speciality Comments: PLQ Eye Exam 09/21/2021 WNL @ [facility]  Follow up in 6 months   Procedures:  No procedures performed Allergies: Methylprednisolone, Sulfa antibiotics, Sulfonamide derivatives, and Macrobid [nitrofurantoin]   Assessment / Plan:     Visit Diagnoses: Rheumatoid arthritis with rheumatoid factor of multiple sites without organ or systems involvement ([place]) - RF 74, CCP >250 ,Sed rate 45, ANA 1: 320 cytoplasmic, positive synovitis: She had no synovitis on examination.  She has been taking hydroxychloroquine 200 mg p.o. twice daily without any side effects.  She states she has some discomfort in her shoulders when she sleeps on her side which has been going on off and on for the last few weeks.  The symptoms are better today.  She also has discomfort in her bilateral trochanteric bursa.  None of the other joints are painful.  High risk medication use - Plaquenil 200 mg 1 tablet by mouth twice daily-Started in May 2020. PLQ Eye Exam 09/21/2021 -Labs from November 19, 2021 CBC with differential and CMP with GFR were reviewed which were within normal limits.  Plan: CBC with Differential/Platelet, COMPLETE METABOLIC PANEL WITH GFR today and then in 5 months.  Chronic pain of both shoulders-she complains of discomfort in her shoulders.  Her shoulder joints are in good range of motion without any tenderness or effusion.  A handout on shoulder joint exercises was given.  Pain of right middle finger -resolved  Primary osteoarthritis of both  hands-she denies any discomfort in her hands today.  No synovitis was noted.  Trochanteric bursitis of both hips-she continues to have trochanteric bursa tenderness bilaterally.  I offered cortisone injection which she declined.  I also offered physical therapy but she would like to wait.  I gave her a handout on IT band stretches.  Primary osteoarthritis of both knees-she has off-and-on  discomfort in her knee joints.  No warmth swelling or effusion was noted.  Primary osteoarthritis of both feet-she had no tenderness over MTPs or PIPs.  Positive ANA (antinuclear antibody) - ENA, C3-C4, anticardiolipin, lupus anticoagulant, beta-2 GP 1-were all negative.  She has no other clinical features of autoimmune disease.   Other medical problems listed as follows:  History of pulmonary embolism  History of DVT (deep vein thrombosis)  Hyperlipidemia with target LDL less than 130  History of anemia  PVC's (premature ventricular contractions)  Essential hypertension  History of atrial fibrillation  Vitamin D deficiency - Plan: VITAMIN D 25 Hydroxy (Vit-D Deficiency, Fractures)  Orders: Orders Placed This Encounter  Procedures   CBC with Differential/Platelet   COMPLETE METABOLIC PANEL WITH GFR   VITAMIN D 25 Hydroxy (Vit-D Deficiency, Fractures)   No orders of the defined types were placed in this encounter.    Follow-Up Instructions: Return in about 5 months (around 09/27/2022) for Rheumatoid arthritis, Osteoarthritis.   Bo Merino, MD  Note - This record has been created using Editor, commissioning.  Chart creation errors have been sought, but may not always  have been located. Such creation errors do not reflect on  the standard of medical care.

## 2022-04-14 ENCOUNTER — Other Ambulatory Visit (HOSPITAL_COMMUNITY): Payer: Self-pay

## 2022-04-14 ENCOUNTER — Encounter: Payer: Self-pay | Admitting: Internal Medicine

## 2022-04-14 ENCOUNTER — Other Ambulatory Visit: Payer: Self-pay | Admitting: *Deleted

## 2022-04-14 DIAGNOSIS — M0579 Rheumatoid arthritis with rheumatoid factor of multiple sites without organ or systems involvement: Secondary | ICD-10-CM

## 2022-04-14 MED ORDER — HYDROXYCHLOROQUINE SULFATE 200 MG PO TABS
ORAL_TABLET | ORAL | 0 refills | Status: DC
  Filled 2022-04-14: qty 120, 84d supply, fill #0

## 2022-04-14 NOTE — Telephone Encounter (Signed)
Patient contacted the office stating she needs a refill on her PLQ. Patient states she did not decrease the PLQ as advised in June and has been taking PLQ 2 tabs daily instead of just Mon-Fri.   Next Visit: 04/27/2022   Last Visit: 11/24/2021   Labs: 11/19/2021 GFR 52.70   Eye exam: 09/21/2021 WNL   Current Dose per phone note 02/03/2022: GFR was low in March.  Please advise patient to reduce hydroxychloroquine to 1 tablet p.o. twice daily Monday to Friday.   DX: Rheumatoid arthritis with rheumatoid factor of multiple sites without organ or systems involvement    Last Fill: 02/04/2022   Patient to update labs at upcoming appointment on 04/27/2022   Okay to refill Plaquenil?

## 2022-04-27 ENCOUNTER — Ambulatory Visit: Payer: 59 | Attending: Rheumatology | Admitting: Rheumatology

## 2022-04-27 ENCOUNTER — Encounter: Payer: Self-pay | Admitting: Rheumatology

## 2022-04-27 VITALS — BP 138/81 | HR 69 | Resp 17 | Ht 69.0 in | Wt 284.6 lb

## 2022-04-27 DIAGNOSIS — M19071 Primary osteoarthritis, right ankle and foot: Secondary | ICD-10-CM

## 2022-04-27 DIAGNOSIS — M0579 Rheumatoid arthritis with rheumatoid factor of multiple sites without organ or systems involvement: Secondary | ICD-10-CM | POA: Diagnosis not present

## 2022-04-27 DIAGNOSIS — Z79899 Other long term (current) drug therapy: Secondary | ICD-10-CM

## 2022-04-27 DIAGNOSIS — M7062 Trochanteric bursitis, left hip: Secondary | ICD-10-CM

## 2022-04-27 DIAGNOSIS — R768 Other specified abnormal immunological findings in serum: Secondary | ICD-10-CM | POA: Diagnosis not present

## 2022-04-27 DIAGNOSIS — Z86711 Personal history of pulmonary embolism: Secondary | ICD-10-CM

## 2022-04-27 DIAGNOSIS — Z86718 Personal history of other venous thrombosis and embolism: Secondary | ICD-10-CM

## 2022-04-27 DIAGNOSIS — M25512 Pain in left shoulder: Secondary | ICD-10-CM

## 2022-04-27 DIAGNOSIS — M19042 Primary osteoarthritis, left hand: Secondary | ICD-10-CM

## 2022-04-27 DIAGNOSIS — M170 Bilateral primary osteoarthritis of knee: Secondary | ICD-10-CM | POA: Diagnosis not present

## 2022-04-27 DIAGNOSIS — M25511 Pain in right shoulder: Secondary | ICD-10-CM | POA: Diagnosis not present

## 2022-04-27 DIAGNOSIS — E559 Vitamin D deficiency, unspecified: Secondary | ICD-10-CM

## 2022-04-27 DIAGNOSIS — M79644 Pain in right finger(s): Secondary | ICD-10-CM

## 2022-04-27 DIAGNOSIS — G8929 Other chronic pain: Secondary | ICD-10-CM

## 2022-04-27 DIAGNOSIS — M7061 Trochanteric bursitis, right hip: Secondary | ICD-10-CM

## 2022-04-27 DIAGNOSIS — M19041 Primary osteoarthritis, right hand: Secondary | ICD-10-CM

## 2022-04-27 DIAGNOSIS — M19072 Primary osteoarthritis, left ankle and foot: Secondary | ICD-10-CM

## 2022-04-27 DIAGNOSIS — I493 Ventricular premature depolarization: Secondary | ICD-10-CM

## 2022-04-27 DIAGNOSIS — Z8679 Personal history of other diseases of the circulatory system: Secondary | ICD-10-CM

## 2022-04-27 DIAGNOSIS — Z862 Personal history of diseases of the blood and blood-forming organs and certain disorders involving the immune mechanism: Secondary | ICD-10-CM

## 2022-04-27 DIAGNOSIS — E785 Hyperlipidemia, unspecified: Secondary | ICD-10-CM

## 2022-04-27 DIAGNOSIS — I10 Essential (primary) hypertension: Secondary | ICD-10-CM

## 2022-04-27 NOTE — Patient Instructions (Addendum)
Vaccines You are taking a medication(s) that can suppress your immune system.  The following immunizations are recommended: Flu annually Covid-19  Td/Tdap (tetanus, diphtheria, pertussis) every 10 years Pneumonia (Prevnar 15 then Pneumovax 23 at least 1 year apart.  Alternatively, can take Prevnar 20 without needing additional dose) Shingrix: 2 doses from 4 weeks to 6 months apart  Please check with your PCP to make sure you are up to date.   Iliotibial Band Syndrome Rehab Ask your health care provider which exercises are safe for you. Do exercises exactly as told by your health care provider and adjust them as directed. It is normal to feel mild stretching, pulling, tightness, or discomfort as you do these exercises. Stop right away if you feel sudden pain or your pain gets significantly worse. Do not begin these exercises until told by your health care provider. Stretching and range-of-motion exercises These exercises warm up your muscles and joints and improve the movement and flexibility of your hip and pelvis. Quadriceps stretch, prone  Lie on your abdomen (prone position) on a firm surface, such as a bed or padded floor. Bend your left / right knee and reach back to hold your ankle or pant leg. If you cannot reach your ankle or pant leg, loop a belt around your foot and grab the belt instead. Gently pull your heel toward your buttocks. Your knee should not slide out to the side. You should feel a stretch in the front of your thigh and knee (quadriceps). Hold this position for __________ seconds. Repeat __________ times. Complete this exercise __________ times a day. Iliotibial band stretch An iliotibial band is a strong band of muscle tissue that runs from the outer side of your hip to the outer side of your thigh and knee. Lie on your side with your left / right leg in the top position. Bend both of your knees and grab your left / right ankle. Stretch out your bottom arm to help you  balance. Slowly bring your top knee back so your thigh goes behind your trunk. Slowly lower your top leg toward the floor until you feel a gentle stretch on the outside of your left / right hip and thigh. If you do not feel a stretch and your knee will not fall farther, place the heel of your other foot on top of your knee and pull your knee down toward the floor with your foot. Hold this position for __________ seconds. Repeat __________ times. Complete this exercise __________ times a day. Strengthening exercises These exercises build strength and endurance in your hip and pelvis. Endurance is the ability to use your muscles for a long time, even after they get tired. Straight leg raises, side-lying This exercise strengthens the muscles that rotate the leg at the hip and move it away from your body (hip abductors). Lie on your side with your left / right leg in the top position. Lie so your head, shoulder, hip, and knee line up. You may bend your bottom knee to help you balance. Roll your hips slightly forward so your hips are stacked directly over each other and your left / right knee is facing forward. Tense the muscles in your outer thigh and lift your top leg 4-6 inches (10-15 cm). Hold this position for __________ seconds. Slowly lower your leg to return to the starting position. Let your muscles relax completely before doing another repetition. Repeat __________ times. Complete this exercise __________ times a day. Leg raises, prone This exercise  strengthens the muscles that move the hips backward (hip extensors). Lie on your abdomen (prone position) on your bed or a firm surface. You can put a pillow under your hips if that is more comfortable for your lower back. Bend your left / right knee so your foot is straight up in the air. Squeeze your buttocks muscles and lift your left / right thigh off the bed. Do not let your back arch. Tense your thigh muscle as hard as you can without  increasing any knee pain. Hold this position for __________ seconds. Slowly lower your leg to return to the starting position and allow it to relax completely. Repeat __________ times. Complete this exercise __________ times a day. Hip hike Stand sideways on a bottom step. Stand on your left / right leg with your other foot unsupported next to the step. You can hold on to a railing or wall for balance if needed. Keep your knees straight and your torso square. Then lift your left / right hip up toward the ceiling. Slowly let your left / right hip lower toward the floor, past the starting position. Your foot should get closer to the floor. Do not lean or bend your knees. Repeat __________ times. Complete this exercise __________ times a day. This information is not intended to replace advice given to you by your health care provider. Make sure you discuss any questions you have with your health care provider. Document Revised: 10/24/2019 Document Reviewed: 10/24/2019 Elsevier Patient Education  [place]. Shoulder Exercises Ask your health care provider which exercises are safe for you. Do exercises exactly as told by your health care provider and adjust them as directed. It is normal to feel mild stretching, pulling, tightness, or discomfort as you do these exercises. Stop right away if you feel sudden pain or your pain gets worse. Do not begin these exercises until told by your health care provider. Stretching exercises External rotation and abduction This exercise is sometimes called corner stretch. The exercise rotates your arm outward (external rotation) and moves your arm out from your body (abduction). Stand in a doorway with one of your feet slightly in front of the other. This is called a staggered stance. If you cannot reach your forearms to the door frame, stand facing a corner of a room. Choose one of the following positions as told by your health care provider: Place your hands  and forearms on the door frame above your head. Place your hands and forearms on the door frame at the height of your head. Place your hands on the door frame at the height of your elbows. Slowly move your weight onto your front foot until you feel a stretch across your chest and in the front of your shoulders. Keep your head and chest upright and keep your abdominal muscles tight. Hold for __________ seconds. To release the stretch, shift your weight to your back foot. Repeat __________ times. Complete this exercise __________ times a day. Extension, standing  Stand and hold a broomstick, a cane, or a similar object behind your back. Your hands should be a little wider than shoulder-width apart. Your palms should face away from your back. Keeping your elbows straight and your shoulder muscles relaxed, move the stick away from your body until you feel a stretch in your shoulders (extension). Avoid shrugging your shoulders while you move the stick. Keep your shoulder blades tucked down toward the middle of your back. Hold for __________ seconds. Slowly return to the  starting position. Repeat __________ times. Complete this exercise __________ times a day. Range-of-motion exercises Pendulum  Stand near a wall or a surface that you can hold onto for balance. Bend at the waist and let your left / right arm hang straight down. Use your other arm to support you. Keep your back straight and do not lock your knees. Relax your left / right arm and shoulder muscles, and move your hips and your trunk so your left / right arm swings freely. Your arm should swing because of the motion of your body, not because you are using your arm or shoulder muscles. Keep moving your hips and trunk so your arm swings in the following directions, as told by your health care provider: Side to side. Forward and backward. In clockwise and counterclockwise circles. Continue each motion for __________ seconds, or for as  long as told by your health care provider. Slowly return to the starting position. Repeat __________ times. Complete this exercise __________ times a day. Shoulder flexion, standing  Stand and hold a broomstick, a cane, or a similar object. Place your hands a little more than shoulder-width apart on the object. Your left / right hand should be palm-up, and your other hand should be palm-down. Keep your elbow straight and your shoulder muscles relaxed. Push the stick up with your healthy arm to raise your left / right arm in front of your body, and then over your head until you feel a stretch in your shoulder (flexion). Avoid shrugging your shoulder while you raise your arm. Keep your shoulder blade tucked down toward the middle of your back. Hold for __________ seconds. Slowly return to the starting position. Repeat __________ times. Complete this exercise __________ times a day. Shoulder abduction, standing  Stand and hold a broomstick, a cane, or a similar object. Place your hands a little more than shoulder-width apart on the object. Your left / right hand should be palm-up, and your other hand should be palm-down. Keep your elbow straight and your shoulder muscles relaxed. Push the object across your body toward your left / right side. Raise your left / right arm to the side of your body (abduction) until you feel a stretch in your shoulder. Do not raise your arm above shoulder height unless your health care provider tells you to do that. If directed, raise your arm over your head. Avoid shrugging your shoulder while you raise your arm. Keep your shoulder blade tucked down toward the middle of your back. Hold for __________ seconds. Slowly return to the starting position. Repeat __________ times. Complete this exercise __________ times a day. Internal rotation  Place your left / right hand behind your back, palm-up. Use your other hand to dangle an exercise band, a broomstick, or a  similar object over your shoulder. Grasp the band with your left / right hand so you are holding on to both ends. Gently pull up on the band until you feel a stretch in the front of your left / right shoulder. The movement of your arm toward the center of your body is called internal rotation. Avoid shrugging your shoulder while you raise your arm. Keep your shoulder blade tucked down toward the middle of your back. Hold for __________ seconds. Release the stretch by letting go of the band and lowering your hands. Repeat __________ times. Complete this exercise __________ times a day. Strengthening exercises External rotation  Sit in a stable chair without armrests. Secure an exercise band to a stable  object at elbow height on your left / right side. Place a soft object, such as a folded towel or a small pillow, between your left / right upper arm and your body to move your elbow about 4 inches (10 cm) away from your side. Hold the end of the exercise band so it is tight and there is no slack. Keeping your elbow pressed against the soft object, slowly move your forearm out, away from your abdomen (external rotation). Keep your body steady so only your forearm moves. Hold for __________ seconds. Slowly return to the starting position. Repeat __________ times. Complete this exercise __________ times a day. Shoulder abduction  Sit in a stable chair without armrests, or stand up. Hold a __________ lb / kg weight in your left / right hand, or hold an exercise band with both hands. Start with your arms straight down and your left / right palm facing in, toward your body. Slowly lift your left / right hand out to your side (abduction). Do not lift your hand above shoulder height unless your health care provider tells you that this is safe. Keep your arms straight. Avoid shrugging your shoulder while you do this movement. Keep your shoulder blade tucked down toward the middle of your back. Hold for  __________ seconds. Slowly lower your arm, and return to the starting position. Repeat __________ times. Complete this exercise __________ times a day. Shoulder extension  Sit in a stable chair without armrests, or stand up. Secure an exercise band to a stable object in front of you so it is at shoulder height. Hold one end of the exercise band in each hand. Straighten your elbows and lift your hands up to shoulder height. Squeeze your shoulder blades together as you pull your hands down to the sides of your thighs (extension). Stop when your hands are straight down by your sides. Do not let your hands go behind your body. Hold for __________ seconds. Slowly return to the starting position. Repeat __________ times. Complete this exercise __________ times a day. Shoulder row  Sit in a stable chair without armrests, or stand up. Secure an exercise band to a stable object in front of you so it is at chest height. Hold one end of the exercise band in each hand. Position your palms so that your thumbs are facing the ceiling (neutral position). Bend each of your elbows to a 90-degree angle (right angle) and keep your upper arms at your sides. Step back or move the chair back until the band is tight and there is no slack. Slowly pull your elbows back behind you. Hold for __________ seconds. Slowly return to the starting position. Repeat __________ times. Complete this exercise __________ times a day. Shoulder press-ups  Sit in a stable chair that has armrests. Sit upright, with your feet flat on the floor. Put your hands on the armrests so your elbows are bent and your fingers are pointing forward. Your hands should be about even with the sides of your body. Push down on the armrests and use your arms to lift yourself off the chair. Straighten your elbows and lift yourself up as much as you comfortably can. Move your shoulder blades down, and avoid letting your shoulders move up toward your  ears. Keep your feet on the ground. As you get stronger, your feet should support less of your body weight as you lift yourself up. Hold for __________ seconds. Slowly lower yourself back into the chair. Repeat __________ times. Complete  this exercise __________ times a day. Wall push-ups  Stand so you are facing a stable wall. Your feet should be about one arm-length away from the wall. Lean forward and place your palms on the wall at shoulder height. Keep your feet flat on the floor as you bend your elbows and lean forward toward the wall. Hold for __________ seconds. Straighten your elbows to push yourself back to the starting position. Repeat __________ times. Complete this exercise __________ times a day. This information is not intended to replace advice given to you by your health care provider. Make sure you discuss any questions you have with your health care provider. Document Revised: 10/06/2021 Document Reviewed: 10/06/2021 Elsevier Patient Education  [place].

## 2022-04-28 LAB — CBC WITH DIFFERENTIAL/PLATELET
Absolute Monocytes: 290 cells/uL (ref 200–950)
Basophils Absolute: 40 cells/uL (ref 0–200)
Basophils Relative: 0.8 %
Eosinophils Absolute: 120 cells/uL (ref 15–500)
Eosinophils Relative: 2.4 %
HCT: 33.3 % — ABNORMAL LOW (ref 35.0–45.0)
Hemoglobin: 11.1 g/dL — ABNORMAL LOW (ref 11.7–15.5)
Lymphs Abs: 1305 cells/uL (ref 850–3900)
MCH: 27.3 pg (ref 27.0–33.0)
MCHC: 33.3 g/dL (ref 32.0–36.0)
MCV: 81.8 fL (ref 80.0–100.0)
MPV: 10 fL (ref 7.5–12.5)
Monocytes Relative: 5.8 %
Neutro Abs: 3245 cells/uL (ref 1500–7800)
Neutrophils Relative %: 64.9 %
Platelets: 249 10*3/uL (ref 140–400)
RBC: 4.07 10*6/uL (ref 3.80–5.10)
RDW: 12.6 % (ref 11.0–15.0)
Total Lymphocyte: 26.1 %
WBC: 5 10*3/uL (ref 3.8–10.8)

## 2022-04-28 LAB — COMPLETE METABOLIC PANEL WITH GFR
AG Ratio: 1.3 (calc) (ref 1.0–2.5)
ALT: 9 U/L (ref 6–29)
AST: 16 U/L (ref 10–35)
Albumin: 4 g/dL (ref 3.6–5.1)
Alkaline phosphatase (APISO): 77 U/L (ref 37–153)
BUN/Creatinine Ratio: 19 (calc) (ref 6–22)
BUN: 22 mg/dL (ref 7–25)
CO2: 23 mmol/L (ref 20–32)
Calcium: 9.1 mg/dL (ref 8.6–10.4)
Chloride: 105 mmol/L (ref 98–110)
Creat: 1.17 mg/dL — ABNORMAL HIGH (ref 0.50–1.05)
Globulin: 3.1 g/dL (calc) (ref 1.9–3.7)
Glucose, Bld: 82 mg/dL (ref 65–99)
Potassium: 3.9 mmol/L (ref 3.5–5.3)
Sodium: 139 mmol/L (ref 135–146)
Total Bilirubin: 0.5 mg/dL (ref 0.2–1.2)
Total Protein: 7.1 g/dL (ref 6.1–8.1)
eGFR: 51 mL/min/{1.73_m2} — ABNORMAL LOW (ref >=60)

## 2022-04-28 LAB — VITAMIN D 25 HYDROXY (VIT D DEFICIENCY, FRACTURES): Vit D, 25-Hydroxy: 26 ng/mL — ABNORMAL LOW (ref 30–100)

## 2022-04-28 NOTE — Progress Notes (Signed)
Hemoglobin is low at 11.1.  Creatinine elevated at 1.17.  Vitamin D is low at 26.  Please have patient take multivitamin with iron.  She should take vitamin D 2000 units daily.  She should discuss low hemoglobin and elevated creatinine with her PCP.  Please forward results to her PCP.

## 2022-05-04 ENCOUNTER — Ambulatory Visit: Payer: 59 | Admitting: Internal Medicine

## 2022-05-04 ENCOUNTER — Other Ambulatory Visit (HOSPITAL_COMMUNITY): Payer: Self-pay

## 2022-05-04 ENCOUNTER — Encounter: Payer: Self-pay | Admitting: Internal Medicine

## 2022-05-04 VITALS — BP 136/74 | HR 79 | Temp 98.2°F | Ht 69.0 in | Wt 284.0 lb

## 2022-05-04 DIAGNOSIS — N3281 Overactive bladder: Secondary | ICD-10-CM | POA: Diagnosis not present

## 2022-05-04 DIAGNOSIS — I10 Essential (primary) hypertension: Secondary | ICD-10-CM | POA: Diagnosis not present

## 2022-05-04 DIAGNOSIS — R1319 Other dysphagia: Secondary | ICD-10-CM | POA: Diagnosis not present

## 2022-05-04 DIAGNOSIS — E519 Thiamine deficiency, unspecified: Secondary | ICD-10-CM

## 2022-05-04 DIAGNOSIS — R002 Palpitations: Secondary | ICD-10-CM | POA: Insufficient documentation

## 2022-05-04 DIAGNOSIS — K2100 Gastro-esophageal reflux disease with esophagitis, without bleeding: Secondary | ICD-10-CM | POA: Diagnosis not present

## 2022-05-04 DIAGNOSIS — D539 Nutritional anemia, unspecified: Secondary | ICD-10-CM | POA: Insufficient documentation

## 2022-05-04 DIAGNOSIS — D538 Other specified nutritional anemias: Secondary | ICD-10-CM

## 2022-05-04 LAB — IBC + FERRITIN
Ferritin: 49.3 ng/mL (ref 10.0–291.0)
Iron: 99 ug/dL (ref 42–145)
Saturation Ratios: 33.4 % (ref 20.0–50.0)
TIBC: 296.8 ug/dL (ref 250.0–450.0)
Transferrin: 212 mg/dL (ref 212.0–360.0)

## 2022-05-04 LAB — TSH: TSH: 1.7 u[IU]/mL (ref 0.35–5.50)

## 2022-05-04 LAB — VITAMIN B12: Vitamin B-12: 252 pg/mL (ref 211–911)

## 2022-05-04 LAB — FOLATE: Folate: 8.2 ng/mL (ref >5.9)

## 2022-05-04 MED ORDER — GEMTESA 75 MG PO TABS
1.0000 | ORAL_TABLET | Freq: Every day | ORAL | 1 refills | Status: DC
  Filled 2022-05-04: qty 90, 90d supply, fill #0

## 2022-05-04 MED ORDER — ESOMEPRAZOLE MAGNESIUM 40 MG PO CPDR
40.0000 mg | DELAYED_RELEASE_CAPSULE | Freq: Every day | ORAL | 1 refills | Status: DC
  Filled 2022-05-04: qty 90, 90d supply, fill #0
  Filled 2022-09-29: qty 90, 90d supply, fill #1

## 2022-05-04 NOTE — Progress Notes (Signed)
Subjective:  Patient ID: Pamela Davenport, female    DOB: Mar 17, 1956  Age: 66 y.o. MRN: [id number]  CC: Anemia, Hypertension, and Gastroesophageal Reflux   HPI DARRELL LEONHARDT presents for f/up -  She complains of an aching discomfort under her sternum and odynophagia with heartburn.  She is trying to control the symptoms with Tums.  She also complains of palpitations and fatigue.  She denies chest pain,shortness of breath, or diaphoresis.  She is no longer taking Myrbetriq because it made her blood pressure go up.  Outpatient Medications Prior to Visit  Medication Sig Dispense Refill   flecainide (TAMBOCOR) 150 MG tablet Take 2 tablets by mouth at the onset of AFIB 30 tablet 3   hydroxychloroquine (PLAQUENIL) 200 MG tablet Take one tablet by mouth twice daily Monday-Friday only 120 tablet 0   indapamide (LOZOL) 1.25 MG tablet Take 1 tablet (1.25 mg total) by mouth daily. 90 tablet 1   losartan (COZAAR) 100 MG tablet Take 1 tablet by mouth daily. 90 tablet 3   nadolol (CORGARD) 20 MG tablet TAKE 1 TABLET BY MOUTH ONCE A DAY 90 tablet 3   nystatin-triamcinolone ointment (MYCOLOG) Apply to affected areas two times a day 20 g 2   Omega-3 Fatty Acids (OMEGA 3 PO) Take 1 tablet by mouth daily.     rivaroxaban (XARELTO) 20 MG TABS tablet TAKE 1 TABLET BY MOUTH DAILY WITH SUPPER 90 tablet 3   TART CHERRY PO Take 2 tablets by mouth daily.     mirabegron ER (MYRBETRIQ) 50 MG TB24 tablet Take 1 tablet (50 mg total) by mouth daily. 90 tablet 1   No facility-administered medications prior to visit.    ROS Review of Systems  Constitutional: Negative.  Negative for chills, diaphoresis and fatigue.  HENT:  Positive for trouble swallowing. Negative for sore throat and voice change.   Eyes: Negative.   Respiratory: Negative.  Negative for cough, chest tightness, shortness of breath and wheezing.   Cardiovascular:  Positive for chest pain and palpitations. Negative for leg swelling.   Gastrointestinal:  Negative for abdominal pain, constipation, diarrhea, nausea and vomiting.  Endocrine: Negative.   Genitourinary: Negative.  Negative for difficulty urinating.  Musculoskeletal: Negative.  Negative for back pain and myalgias.  Skin: Negative.   Neurological: Negative.  Negative for dizziness, weakness and headaches.  Hematological:  Negative for adenopathy. Does not bruise/bleed easily.  Psychiatric/Behavioral:  Negative for confusion.     Objective:  BP 136/74 (BP Location: Left Arm, Patient Position: Sitting, Cuff Size: Large)   Pulse 79   Temp 98.2 F (36.8 C) (Oral)   Ht '5\' 9"'$  (1.753 m)   Wt 284 lb (128.8 kg)   SpO2 98%   BMI 41.94 kg/m   BP Readings from Last 3 Encounters:  05/04/22 136/74  04/27/22 138/81  04/07/22 (!) 142/80    Wt Readings from Last 3 Encounters:  05/04/22 284 lb (128.8 kg)  04/27/22 284 lb 9.6 oz (129.1 kg)  04/07/22 281 lb 9.6 oz (127.7 kg)    Physical Exam Vitals reviewed.  Constitutional:      Appearance: Normal appearance.  HENT:     Mouth/Throat:     Mouth: Mucous membranes are moist.  Eyes:     General: No scleral icterus.    Conjunctiva/sclera: Conjunctivae normal.  Cardiovascular:     Rate and Rhythm: Normal rate and regular rhythm.     Heart sounds: Normal heart sounds, S1 normal and S2 normal. No murmur heard.  No gallop.     Comments: EKG- SR with 1st degree AV block, 78 bpm No LVH or Q waves unchanged Pulmonary:     Effort: Pulmonary effort is normal.     Breath sounds: No stridor. No wheezing, rhonchi or rales.  Abdominal:     General: Abdomen is flat.     Palpations: There is no mass.     Tenderness: There is no abdominal tenderness. There is no guarding.     Hernia: No hernia is present.  Musculoskeletal:        General: Normal range of motion.     Cervical back: Neck supple.     Right lower leg: No edema.     Left lower leg: No edema.  Skin:    General: Skin is warm and dry.     Findings:  No rash.  Neurological:     General: No focal deficit present.     Mental Status: She is alert.  Psychiatric:        Mood and Affect: Mood normal.        Behavior: Behavior normal.     Lab Results  Component Value Date   WBC 5.0 04/27/2022   HGB 11.1 (L) 04/27/2022   HCT 33.3 (L) 04/27/2022   PLT 249 04/27/2022   GLUCOSE 82 04/27/2022   CHOL 158 08/27/2021   TRIG 59.0 08/27/2021   HDL 45.80 08/27/2021   LDLCALC 101 (H) 08/27/2021   ALT 9 04/27/2022   AST 16 04/27/2022   NA 139 04/27/2022   K 3.9 04/27/2022   CL 105 04/27/2022   CREATININE 1.17 (H) 04/27/2022   BUN 22 04/27/2022   CO2 23 04/27/2022   TSH 1.70 05/04/2022   INR 3.1 04/26/2013    No results found.  Assessment & Plan:   Luciel was seen today for anemia, hypertension and gastroesophageal reflux.  Diagnoses and all orders for this visit:  Deficiency anemia- Labs are remarkable for thiamine deficiency. -     Reticulocytes; Future -     Vitamin B1; Future -     Zinc; Future -     Cancel: VITAMIN D 25 Hydroxy (Vit-D Deficiency, Fractures); Future -     Folate; Future -     IBC + Ferritin; Future -     Vitamin B12; Future -     Vitamin B12 -     IBC + Ferritin -     Folate -     Zinc -     Vitamin B1 -     Reticulocytes  Gastroesophageal reflux disease with esophagitis without hemorrhage- I recommended that she start taking a PPI. -     esomeprazole (NEXIUM) 40 MG capsule; Take 1 capsule (40 mg total) by mouth daily.  Esophageal dysphagia -     Ambulatory referral to Gastroenterology  OAB (overactive bladder) -     Vibegron (GEMTESA) 75 MG TABS; Take 1 tablet by mouth daily.  Intermittent palpitations- Her EKG is reassuring. -     TSH; Future -     TSH  Essential hypertension- Her blood pressure is adequately well controlled. -     TSH; Future -     TSH  Anemia due to acquired thiamine deficiency -     thiamine (VITAMIN B-1) 50 MG tablet; Take 1 tablet (50 mg total) by mouth daily.   I  have discontinued David Stall. Likes's mirabegron ER. I am also having her start on esomeprazole, Gemtesa, and thiamine. Additionally, I am  having her maintain her Omega-3 Fatty Acids (OMEGA 3 PO), rivaroxaban, losartan, flecainide, nadolol, nystatin-triamcinolone ointment, TART CHERRY PO, indapamide, and hydroxychloroquine.  Meds ordered this encounter  Medications   esomeprazole (NEXIUM) 40 MG capsule    Sig: Take 1 capsule (40 mg total) by mouth daily.    Dispense:  90 capsule    Refill:  1   Vibegron (GEMTESA) 75 MG TABS    Sig: Take 1 tablet by mouth daily.    Dispense:  90 tablet    Refill:  1   thiamine (VITAMIN B-1) 50 MG tablet    Sig: Take 1 tablet (50 mg total) by mouth daily.    Dispense:  90 tablet    Refill:  1     Follow-up: Return in about 3 months (around 08/03/2022).  Scarlette Calico, MD

## 2022-05-04 NOTE — Patient Instructions (Signed)

## 2022-05-05 ENCOUNTER — Other Ambulatory Visit (HOSPITAL_COMMUNITY): Payer: Self-pay

## 2022-05-06 ENCOUNTER — Other Ambulatory Visit: Payer: Self-pay | Admitting: Internal Medicine

## 2022-05-06 ENCOUNTER — Ambulatory Visit
Admission: RE | Admit: 2022-05-06 | Discharge: 2022-05-06 | Disposition: A | Discharge status: Home / Self Care | Payer: 59 | Source: Ambulatory Visit | Attending: Internal Medicine | Admitting: Internal Medicine

## 2022-05-06 DIAGNOSIS — R928 Other abnormal and inconclusive findings on diagnostic imaging of breast: Secondary | ICD-10-CM | POA: Diagnosis not present

## 2022-05-06 DIAGNOSIS — N6324 Unspecified lump in the left breast, lower inner quadrant: Secondary | ICD-10-CM | POA: Diagnosis not present

## 2022-05-06 DIAGNOSIS — N6320 Unspecified lump in the left breast, unspecified quadrant: Secondary | ICD-10-CM

## 2022-05-07 ENCOUNTER — Other Ambulatory Visit (HOSPITAL_COMMUNITY): Payer: Self-pay

## 2022-05-07 ENCOUNTER — Other Ambulatory Visit: Payer: Self-pay | Admitting: Internal Medicine

## 2022-05-07 DIAGNOSIS — D538 Other specified nutritional anemias: Secondary | ICD-10-CM | POA: Insufficient documentation

## 2022-05-07 LAB — RETICULOCYTES
ABS Retic: 61040 cells/uL (ref 20000–80000)
Retic Ct Pct: 1.4 %

## 2022-05-07 LAB — VITAMIN B1: Vitamin B1 (Thiamine): <6 nmol/L — ABNORMAL LOW (ref 8–30)

## 2022-05-07 LAB — ZINC: Zinc: 75 ug/dL (ref 60–130)

## 2022-05-07 MED ORDER — VITAMIN B-1 50 MG PO TABS
50.0000 mg | ORAL_TABLET | Freq: Every day | ORAL | 1 refills | Status: DC
  Filled 2022-05-07: qty 90, 90d supply, fill #0

## 2022-05-12 NOTE — Addendum Note (Signed)
Addended by: Hinda Kehr on: 05/12/2022 04:10 PM   Modules accepted: Orders

## 2022-06-07 ENCOUNTER — Other Ambulatory Visit (HOSPITAL_COMMUNITY): Payer: Self-pay

## 2022-06-07 ENCOUNTER — Telehealth: Payer: Self-pay | Admitting: *Deleted

## 2022-06-07 DIAGNOSIS — R1310 Dysphagia, unspecified: Secondary | ICD-10-CM | POA: Diagnosis not present

## 2022-06-07 DIAGNOSIS — Z86010 Personal history of colonic polyps: Secondary | ICD-10-CM | POA: Diagnosis not present

## 2022-06-07 DIAGNOSIS — I480 Paroxysmal atrial fibrillation: Secondary | ICD-10-CM | POA: Diagnosis not present

## 2022-06-07 DIAGNOSIS — K219 Gastro-esophageal reflux disease without esophagitis: Secondary | ICD-10-CM | POA: Diagnosis not present

## 2022-06-07 MED ORDER — CLINDAMYCIN PHOSPHATE 1 % EX GEL
CUTANEOUS | 5 refills | Status: DC
  Filled 2022-06-07: qty 60, 30d supply, fill #0

## 2022-06-07 NOTE — Telephone Encounter (Signed)
   Pre-operative Risk Assessment    Patient Name: Pamela Davenport  DOB: 02/18/1956 MRN: [id number]      Request for Surgical Clearance    Procedure:   COLONOSCOPY / ENDOSCOPY  Date of Surgery:  Clearance 07/14/22                                 Surgeon:  DR. Michail Sermon Surgeon's Group or Practice Name:  [facility] Phone number:  [phone] Fax number:  [phone]   Type of Clearance Requested:   - Pharmacy:  Hold Rivaroxaban (Xarelto) X'S 3 DAYS   Type of Anesthesia:   PROPOFOL   Additional requests/questions:    Astrid Divine   06/07/2022, 11:57 AM

## 2022-06-09 ENCOUNTER — Other Ambulatory Visit (HOSPITAL_COMMUNITY): Payer: Self-pay

## 2022-06-11 NOTE — Telephone Encounter (Signed)
Patient with diagnosis of PAF and PE on Xarelto for anticoagulation.    Procedure: COLONOSCOPY / ENDOSCOPY Date of procedure: 07/14/22   CHA2DS2-VASc Score = 3  This indicates a 3.2% annual risk of stroke. The patient's score is based upon: CHF History: 0 HTN History: 1 Diabetes History: 0 Stroke History: 0 Vascular Disease History: 0 Age Score: 1 Gender Score: 1   CrCl 68 mL/min using adjusted body weight Platelet count 249K  Patient with a history of recurrent PE and A Fib. Recommend holding Xarelto for 1 day. If more than a one day hold is required, will need authorization from cardiologist.   **This guidance is not considered finalized until pre-operative APP has relayed final recommendations.**

## 2022-06-11 NOTE — Telephone Encounter (Signed)
   Name: Pamela Davenport  DOB: [birth year]  MRN: [id number]  Primary Cardiologist: None  Chart reviewed as part of pre-operative protocol coverage. Because of BALEIGH RENNAKER past medical history and time since last visit, she will require a follow-up telephone visit in order to better assess preoperative cardiovascular risk.  Pre-op covering staff: - Please schedule appointment and call patient to inform them. If patient already had an upcoming appointment within acceptable timeframe, please add "pre-op clearance" to the appointment notes so provider is aware. - Please contact requesting surgeon's office via preferred method (i.e, phone, fax) to inform them of need for appointment prior to surgery.  Patient with a history of recurrent PE and A Fib. Recommend holding Xarelto for 1 day. If more than a one day hold is required, will need authorization from cardiologist  Elgie Collard, PA-C  06/11/2022, 8:33 AM

## 2022-06-14 ENCOUNTER — Telehealth: Payer: Self-pay

## 2022-06-14 NOTE — Telephone Encounter (Signed)
Spoke with the patient and she was agreeable with telehealth visit. I explained the importance of the visit. Verbal consent given, and med list reviewed.   Patient agreeable and voiced understanding.

## 2022-06-14 NOTE — Telephone Encounter (Signed)
  Patient Consent for Virtual Visit         HYDEIA MCATEE has provided verbal consent on 06/14/2022 for a virtual visit (video or telephone).   CONSENT FOR VIRTUAL VISIT FOR:  Diamantina Providence  By participating in this virtual visit I agree to the following:  I hereby voluntarily request, consent and authorize [facility] and its employed or contracted physicians, physician assistants, nurse practitioners or other licensed health care professionals (the Practitioner), to provide me with telemedicine health care services (the "Services") as deemed necessary by the treating Practitioner. I acknowledge and consent to receive the Services by the Practitioner via telemedicine. I understand that the telemedicine visit will involve communicating with the Practitioner through live audiovisual communication technology and the disclosure of certain medical information by electronic transmission. I acknowledge that I have been given the opportunity to request an in-person assessment or other available alternative prior to the telemedicine visit and am voluntarily participating in the telemedicine visit.  I understand that I have the right to withhold or withdraw my consent to the use of telemedicine in the course of my care at any time, without affecting my right to future care or treatment, and that the Practitioner or I may terminate the telemedicine visit at any time. I understand that I have the right to inspect all information obtained and/or recorded in the course of the telemedicine visit and may receive copies of available information for a reasonable fee.  I understand that some of the potential risks of receiving the Services via telemedicine include:  Delay or interruption in medical evaluation due to technological equipment failure or disruption; Information transmitted may not be sufficient (e.g. poor resolution of images) to allow for appropriate medical decision making by the  Practitioner; and/or  In rare instances, security protocols could fail, causing a breach of personal health information.  Furthermore, I acknowledge that it is my responsibility to provide information about my medical history, conditions and care that is complete and accurate to the best of my ability. I acknowledge that Practitioner's advice, recommendations, and/or decision may be based on factors not within their control, such as incomplete or inaccurate data provided by me or distortions of diagnostic images or specimens that may result from electronic transmissions. I understand that the practice of medicine is not an exact science and that Practitioner makes no warranties or guarantees regarding treatment outcomes. I acknowledge that a copy of this consent can be made available to me via my patient portal ([facility]), or I can request a printed copy by calling the office of [facility].    I understand that my insurance will be billed for this visit.   I have read or had this consent read to me. I understand the contents of this consent, which adequately explains the benefits and risks of the Services being provided via telemedicine.  I have been provided ample opportunity to ask questions regarding this consent and the Services and have had my questions answered to my satisfaction. I give my informed consent for the services to be provided through the use of telemedicine in my medical care

## 2022-06-21 ENCOUNTER — Ambulatory Visit: Payer: 59 | Attending: Cardiology | Admitting: General Practice

## 2022-06-21 DIAGNOSIS — Z01810 Encounter for preprocedural cardiovascular examination: Secondary | ICD-10-CM

## 2022-06-21 NOTE — Progress Notes (Signed)
ATTN: SEE CLEARANCE NOTES  

## 2022-06-21 NOTE — Progress Notes (Signed)
Virtual Visit via Telephone Note   Because of Melondy Blanchard Fiorini's co-morbid illnesses, she is at least at moderate risk for complications without adequate follow up.  This format is felt to be most appropriate for this patient at this time.  The patient did not have access to video technology/had technical difficulties with video requiring transitioning to audio format only (telephone).  All issues noted in this document were discussed and addressed.  No physical exam could be performed with this format.  Please refer to the patient's chart for her consent to telehealth for [facility].  Evaluation Performed:  Preoperative cardiovascular risk assessment _____________   Date:  06/21/2022   Patient ID:  ARMANDO LAUMAN, DOB 1956/07/27, MRN [id number] Patient Location:  Home Provider location:   Office  Primary Care Provider:  Janith Lima, MD Primary Cardiologist:  Thompson Grayer, MD  Chief Complaint / Patient Profile   66 y.o. y/o female with a h/o pulmonary embolism, PAF, HTN, HLD who is pending colonoscopy/endoscopy and presents today for telephonic preoperative cardiovascular risk assessment.  Past Medical History    Past Medical History:  Diagnosis Date   Atypical chest pain    normal myoview 11/11/10   Blood transfusion without reported diagnosis    DVT (deep venous thrombosis) (HCC)    recurrent; chronically anticoagulated with coumadin   Family history of anesthesia complication    Sister had decrease in respirations after surgery   Heart murmur    Herniated lumbar intervertebral disc    Hypertension    Paroxysmal atrial fibrillation ([place])    SINCE AGE 81    PE (pulmonary embolism)    chronically anticoagulated with coumadin   Pneumonia 07/20/12   PONV (postoperative nausea and vomiting)    Rheumatoid arthritis (HCC)    Sickle cell trait (HCC)    Sinus congestion 08/14/2018   C/O NASAL/CHEST  CONGESTION, COUGHING WITH PHLEGM, PER PATIENT , SHE IS TO BEGIN  TAKING AUGMENTIN DOSE PACK TODAY;     Strep throat at age 3   The patient reports a severe strep throat infection at age 71 and   is not clear as to whether or not she may have had rheumatic fever.   Symptomatic premature ventricular contractions    improved s/p ablation   Urgency of urination    Uterine bleeding 2008   Uterine artery embolization in 2008 for uterine bleeding.    Past Surgical History:  Procedure Laterality Date   COLONOSCOPY W/ POLYPECTOMY     Diagnostic D&C hysteroscopy and Novasure ablation  03/17/2004   DILATION AND CURETTAGE OF UTERUS  12/17/2011   Procedure: DILATATION AND CURETTAGE;  Surgeon: Frederico Hamman, MD;  Location: [facility];  Service: Gynecology;  Laterality: N/A;  With Attempted Hydrothermal Ablation   LUMBAR LAMINECTOMY/DECOMPRESSION MICRODISCECTOMY Left 02/21/2013   Procedure: LUMBAR LAMINECTOMY/DECOMPRESSION MICRODISCECTOMY 1 LEVEL;  Surgeon: Winfield Cunas, MD;  Location: [facility];  Service: Neurosurgery;  Laterality: Left;  LEFT Lumbar Three-Four Laminotomy foraminotomy microdiskectomy   MANDIBLE SURGERY     under bite   PVC Ablation  2010   ROBOTIC ASSISTED TOTAL HYSTERECTOMY WITH BILATERAL SALPINGO OOPHERECTOMY Bilateral 08/21/2018   Procedure: XI ROBOTIC ASSISTED TOTAL HYSTERECTOMY WITH BILATERAL SALPINGO OOPHORECTOMY;  Surgeon: Everitt Amber, MD;  Location: [facility];  Service: Gynecology;  Laterality: Bilateral;   SPINE SURGERY     TONSILLECTOMY     UTERINE ARTERY EMBOLIZATION  2008   for uterine bleeding   VASCULAR SURGERY  laser of left leg    Allergies  Allergies  Allergen Reactions   Methylprednisolone     REACTION: Atrial fibrillation at end of medrol dose pack   Sulfa Antibiotics Hives   Sulfonamide Derivatives Hives   Macrobid [Nitrofurantoin]     Muscle tightening and cramps     History of Present Illness    Pamela Davenport is a 66 y.o. female who presents via audio/video conferencing for a  telehealth visit today.  Pt was last seen in cardiology clinic on 04/07/2022 by Tommye Standard PA-C.  At that time DAURICE OVANDO was doing well .  The patient is now pending procedure as outlined above. Since her last visit, she she remains stable from a cardiac standpoint.  Today she denies chest pain, shortness of breath, lower extremity edema, fatigue, palpitations, melena, hematuria, hemoptysis, diaphoresis, weakness, presyncope, syncope, orthopnea, and PND.    Home Medications    Prior to Admission medications   Medication Sig Start Date End Date Taking? Authorizing Provider  clindamycin (CLINDAGEL) 1 % gel Apply 1 application topically every day 06/07/22     esomeprazole (NEXIUM) 40 MG capsule Take 1 capsule (40 mg total) by mouth daily. 05/04/22   Janith Lima, MD  flecainide [facility]) 150 MG tablet Take 2 tablets by mouth at the onset of AFIB 06/22/21   Shirley Friar, PA-C  hydroxychloroquine (PLAQUENIL) 200 MG tablet Take one tablet by mouth twice daily Monday-Friday only 04/14/22   Ofilia Neas, PA-C  indapamide (LOZOL) 1.25 MG tablet Take 1 tablet (1.25 mg total) by mouth daily. 12/31/21   Janith Lima, MD  losartan (COZAAR) 100 MG tablet Take 1 tablet by mouth daily. 06/22/21 06/29/22  Shirley Friar, PA-C  nadolol (CORGARD) 20 MG tablet TAKE 1 TABLET BY MOUTH ONCE A DAY 07/10/21 08/07/22  Shirley Friar, PA-C  nystatin-triamcinolone ointment [facility]) Apply to affected areas two times a day 08/09/21     Omega-3 Fatty Acids (OMEGA 3 PO) Take 1 tablet by mouth daily.    [provider]  rivaroxaban (XARELTO) 20 MG TABS tablet TAKE 1 TABLET BY MOUTH DAILY WITH SUPPER 06/22/21 08/07/22  Shirley Friar, PA-C  TART CHERRY PO Take 2 tablets by mouth daily.    [provider]  thiamine (VITAMIN B-1) 50 MG tablet Take 1 tablet (50 mg total) by mouth daily. 05/07/22   Janith Lima, MD  Vibegron (GEMTESA) 75 MG TABS Take 1 tablet by mouth  daily. 05/04/22   Janith Lima, MD    Physical Exam    Vital Signs:  Diamantina Providence does not have vital signs available for review today.  Given telephonic nature of communication, physical exam is limited. AAOx3. NAD. Normal affect.  Speech and respirations are unlabored.  Accessory Clinical Findings    None  Assessment & Plan    1.  Preoperative Cardiovascular Risk Assessment: Colonoscopy/endoscopy, 07/14/2022, [place], Dr.Schooler      Primary Cardiologist: Thompson Grayer, MD  Chart reviewed as part of pre-operative protocol coverage. Given past medical history and time since last visit, based on ACC/AHA guidelines, AUDREANA HANCOX would be at acceptable risk for the planned procedure without further cardiovascular testing.   Patient was advised that if he develops new symptoms prior to surgery to contact our office to arrange a follow-up appointment.  He verbalized understanding.  Patient with diagnosis of PAF and PE on Xarelto for anticoagulation.     Procedure: COLONOSCOPY / ENDOSCOPY  Date of procedure: 07/14/22     CHA2DS2-VASc Score = 3  This indicates a 3.2% annual risk of stroke. The patient's score is based upon: CHF History: 0 HTN History: 1 Diabetes History: 0 Stroke History: 0 Vascular Disease History: 0 Age Score: 1 Gender Score: 1     CrCl 68 mL/min using adjusted body weight Platelet count 249K   Patient with a history of recurrent PE and A Fib. Recommend holding Xarelto for 1 day. If more than a one day hold is required, will need authorization from cardiologist.  I will route this recommendation to the requesting party via Epic fax function and remove from pre-op pool.      Time:   Today, I have spent 5 minutes with the patient with telehealth technology discussing medical history, symptoms, and management plan.  Prior to her phone evaluation has been greater than 10 minutes reviewing her past medical history and cardiac  medications.   Deberah Pelton, NP  06/21/2022, 7:48 AM

## 2022-07-01 ENCOUNTER — Other Ambulatory Visit (HOSPITAL_COMMUNITY): Payer: Self-pay

## 2022-07-01 ENCOUNTER — Other Ambulatory Visit: Payer: Self-pay | Admitting: Student

## 2022-07-01 MED ORDER — LOSARTAN POTASSIUM 100 MG PO TABS
100.0000 mg | ORAL_TABLET | Freq: Every day | ORAL | 3 refills | Status: DC
  Filled 2022-07-01: qty 90, 90d supply, fill #0
  Filled 2022-09-29: qty 90, 90d supply, fill #1
  Filled 2022-10-21 – 2022-12-31 (×2): qty 90, 90d supply, fill #2

## 2022-07-01 NOTE — Telephone Encounter (Signed)
Pt's medication was sent to pt's pharmacy as requested. Confirmation received.  °

## 2022-07-02 ENCOUNTER — Other Ambulatory Visit (HOSPITAL_COMMUNITY): Payer: Self-pay

## 2022-07-02 MED ORDER — PEG 3350-KCL-NA BICARB-NACL 420 G PO SOLR
ORAL | 0 refills | Status: DC
  Filled 2022-07-02: qty 8000, 2d supply, fill #0

## 2022-07-05 ENCOUNTER — Other Ambulatory Visit (HOSPITAL_COMMUNITY): Payer: Self-pay

## 2022-07-14 DIAGNOSIS — K317 Polyp of stomach and duodenum: Secondary | ICD-10-CM | POA: Diagnosis not present

## 2022-07-14 DIAGNOSIS — K2101 Gastro-esophageal reflux disease with esophagitis, with bleeding: Secondary | ICD-10-CM | POA: Diagnosis not present

## 2022-07-14 DIAGNOSIS — R1310 Dysphagia, unspecified: Secondary | ICD-10-CM | POA: Diagnosis not present

## 2022-07-14 DIAGNOSIS — K319 Disease of stomach and duodenum, unspecified: Secondary | ICD-10-CM | POA: Diagnosis not present

## 2022-07-14 DIAGNOSIS — K219 Gastro-esophageal reflux disease without esophagitis: Secondary | ICD-10-CM | POA: Diagnosis not present

## 2022-07-14 DIAGNOSIS — Z86010 Personal history of colonic polyps: Secondary | ICD-10-CM | POA: Diagnosis not present

## 2022-07-14 DIAGNOSIS — Z09 Encounter for follow-up examination after completed treatment for conditions other than malignant neoplasm: Secondary | ICD-10-CM | POA: Diagnosis not present

## 2022-07-14 DIAGNOSIS — K259 Gastric ulcer, unspecified as acute or chronic, without hemorrhage or perforation: Secondary | ICD-10-CM | POA: Diagnosis not present

## 2022-07-14 DIAGNOSIS — K648 Other hemorrhoids: Secondary | ICD-10-CM | POA: Diagnosis not present

## 2022-07-14 DIAGNOSIS — D125 Benign neoplasm of sigmoid colon: Secondary | ICD-10-CM | POA: Diagnosis not present

## 2022-07-30 ENCOUNTER — Other Ambulatory Visit (HOSPITAL_COMMUNITY): Payer: Self-pay

## 2022-07-30 ENCOUNTER — Other Ambulatory Visit: Payer: Self-pay | Admitting: Student

## 2022-07-30 ENCOUNTER — Other Ambulatory Visit: Payer: Self-pay | Admitting: Physician Assistant

## 2022-07-30 DIAGNOSIS — M0579 Rheumatoid arthritis with rheumatoid factor of multiple sites without organ or systems involvement: Secondary | ICD-10-CM

## 2022-07-30 DIAGNOSIS — I480 Paroxysmal atrial fibrillation: Secondary | ICD-10-CM

## 2022-07-30 MED ORDER — RIVAROXABAN 20 MG PO TABS
20.0000 mg | ORAL_TABLET | Freq: Every day | ORAL | 3 refills | Status: DC
  Filled 2022-07-30: qty 90, 90d supply, fill #0
  Filled 2022-09-29 – 2022-10-21 (×2): qty 90, 90d supply, fill #1
  Filled 2023-01-27: qty 90, 90d supply, fill #2
  Filled 2023-01-31: qty 30, 30d supply, fill #2
  Filled 2023-04-26: qty 30, 30d supply, fill #3
  Filled 2023-05-23: qty 30, 30d supply, fill #4
  Filled 2023-07-04: qty 30, 30d supply, fill #5

## 2022-07-30 MED ORDER — HYDROXYCHLOROQUINE SULFATE 200 MG PO TABS
200.0000 mg | ORAL_TABLET | Freq: Two times a day (BID) | ORAL | 0 refills | Status: DC
  Filled 2022-07-30: qty 120, 84d supply, fill #0

## 2022-07-30 MED ORDER — NADOLOL 20 MG PO TABS
20.0000 mg | ORAL_TABLET | Freq: Every day | ORAL | 3 refills | Status: DC
  Filled 2022-07-30: qty 56, 56d supply, fill #0
  Filled 2022-08-02: qty 34, 34d supply, fill #0
  Filled 2022-09-29 – 2022-10-21 (×2): qty 90, 90d supply, fill #1
  Filled 2023-01-27: qty 90, 90d supply, fill #2
  Filled 2023-05-23: qty 90, 90d supply, fill #3

## 2022-07-30 NOTE — Telephone Encounter (Signed)
Next Visit: 10/04/2022  Last Visit: 04/27/2022  Labs: 04/27/2022 Hemoglobin is low at 11.1.  Creatinine elevated at 1.17.   Eye exam: 09/21/2021 WNL    Current Dose per office note 04/27/2022: Plaquenil 200 mg 1 tablet by mouth twice daily   [id number] arthritis with rheumatoid factor of multiple sites without organ or systems involvement   Last Fill: 04/14/2022  Okay to refill Plaquenil?

## 2022-08-02 ENCOUNTER — Other Ambulatory Visit (HOSPITAL_COMMUNITY): Payer: Self-pay

## 2022-08-03 ENCOUNTER — Other Ambulatory Visit (HOSPITAL_COMMUNITY): Payer: Self-pay

## 2022-08-09 ENCOUNTER — Other Ambulatory Visit: Payer: Self-pay | Admitting: Internal Medicine

## 2022-08-09 ENCOUNTER — Encounter: Payer: Self-pay | Admitting: Internal Medicine

## 2022-08-09 DIAGNOSIS — N3281 Overactive bladder: Secondary | ICD-10-CM

## 2022-08-19 ENCOUNTER — Other Ambulatory Visit (HOSPITAL_COMMUNITY): Payer: Self-pay

## 2022-08-19 ENCOUNTER — Other Ambulatory Visit: Payer: Self-pay | Admitting: Obstetrics

## 2022-08-19 DIAGNOSIS — L299 Pruritus, unspecified: Secondary | ICD-10-CM

## 2022-08-19 DIAGNOSIS — L0292 Furuncle, unspecified: Secondary | ICD-10-CM

## 2022-08-19 MED ORDER — CLINDAMYCIN HCL 300 MG PO CAPS
300.0000 mg | ORAL_CAPSULE | Freq: Three times a day (TID) | ORAL | 2 refills | Status: DC
  Filled 2022-08-19: qty 21, 7d supply, fill #0
  Filled 2022-12-31: qty 21, 7d supply, fill #1

## 2022-08-19 MED ORDER — HYDROXYZINE HCL 25 MG PO TABS
25.0000 mg | ORAL_TABLET | Freq: Three times a day (TID) | ORAL | 2 refills | Status: DC | PRN
  Filled 2022-08-19: qty 30, 10d supply, fill #0
  Filled 2022-09-29: qty 30, 10d supply, fill #1

## 2022-09-29 ENCOUNTER — Other Ambulatory Visit: Payer: Self-pay | Admitting: Internal Medicine

## 2022-09-29 ENCOUNTER — Other Ambulatory Visit (HOSPITAL_COMMUNITY): Payer: Self-pay

## 2022-09-29 ENCOUNTER — Other Ambulatory Visit: Payer: Self-pay

## 2022-09-29 DIAGNOSIS — I10 Essential (primary) hypertension: Secondary | ICD-10-CM

## 2022-09-29 MED ORDER — INDAPAMIDE 1.25 MG PO TABS
1.2500 mg | ORAL_TABLET | Freq: Every day | ORAL | 0 refills | Status: DC
  Filled 2022-09-29: qty 90, 90d supply, fill #0

## 2022-10-04 ENCOUNTER — Ambulatory Visit: Payer: 59 | Admitting: Rheumatology

## 2022-10-21 ENCOUNTER — Encounter: Payer: Self-pay | Admitting: *Deleted

## 2022-10-21 ENCOUNTER — Other Ambulatory Visit: Payer: Self-pay | Admitting: Rheumatology

## 2022-10-21 ENCOUNTER — Other Ambulatory Visit: Payer: Self-pay

## 2022-10-21 ENCOUNTER — Other Ambulatory Visit (HOSPITAL_COMMUNITY): Payer: Self-pay

## 2022-10-21 DIAGNOSIS — M0579 Rheumatoid arthritis with rheumatoid factor of multiple sites without organ or systems involvement: Secondary | ICD-10-CM

## 2022-10-21 MED ORDER — HYDROXYCHLOROQUINE SULFATE 200 MG PO TABS
200.0000 mg | ORAL_TABLET | Freq: Two times a day (BID) | ORAL | 0 refills | Status: DC
  Filled 2022-10-21: qty 60, 30d supply, fill #0

## 2022-10-21 NOTE — Telephone Encounter (Signed)
Next Visit: 11/04/2022  Last Visit: 04/27/2022  Labs: 04/27/2022 Hemoglobin is low at 11.1.  Creatinine elevated at 1.17   Eye exam: 09/21/2021 WNL    Current Dose per office note 04/27/2022: Plaquenil 200 mg 1 tablet by mouth twice daily   [id number] arthritis with rheumatoid factor of multiple sites without organ or systems involvement   Last Fill: 07/30/2022  Sent message via my chart to remind patient she is due to update PLQ eye exam.   Okay to refill Plaquenil?

## 2022-10-21 NOTE — Addendum Note (Signed)
Addended by: Carole Binning on: 10/21/2022 10:17 AM   Modules accepted: Orders

## 2022-10-21 NOTE — Progress Notes (Signed)
Office Visit Note  Patient: Pamela Davenport             Date of Birth: [birth year]           MRN: [id number]             PCP: Janith Lima, MD Referring: Janith Lima, MD Visit Date: 11/04/2022 Occupation: '@GUAROCC'$ @  Subjective:  Left ring finger pain  History of Present Illness: Pamela Davenport is a 67 y.o. female tree of rheumatoid arthritis, osteoarthritis and positive ANA.  She states she had good response to her right middle trigger finger injection in January 2023.  Recently she has been experiencing triggering of her left ring finger.  She has been taking hydroxychloroquine 200 mg p.o. twice daily Monday to Friday without any interruption which she has been tolerating well.  She continues to have some discomfort in her shoulders and her hips.  Knee joints causes discomfort off-and-on.  She has not noticed any joint swelling.    Activities of Daily Living:  Patient reports morning stiffness for 15 minutes.   Patient Reports nocturnal pain.  Difficulty dressing/grooming: Denies Difficulty climbing stairs: Reports Difficulty getting out of chair: Reports Difficulty using hands for taps, buttons, cutlery, and/or writing: Reports  Review of Systems  Constitutional:  Positive for fatigue.  HENT: Negative.  Negative for mouth sores and mouth dryness.   Eyes: Negative.  Negative for dryness.  Respiratory: Negative.  Negative for shortness of breath.   Cardiovascular:  Negative for chest pain.  Gastrointestinal: Negative.  Negative for blood in stool, constipation and diarrhea.  Endocrine: Positive for increased urination.  Genitourinary:  Positive for involuntary urination.  Musculoskeletal:  Positive for joint pain, gait problem, joint pain, joint swelling, muscle weakness and morning stiffness. Negative for myalgias, muscle tenderness and myalgias.  Skin: Negative.  Negative for color change, rash, hair loss and sensitivity to sunlight.  Allergic/Immunologic: Negative.   Negative for susceptible to infections.  Neurological:  Positive for headaches. Negative for dizziness.  Hematological:  Positive for swollen glands.  Psychiatric/Behavioral:  Positive for sleep disturbance. Negative for depressed mood. The patient is nervous/anxious.     PMFS History:  Patient Active Problem List   Diagnosis Date Noted   Anemia due to acquired thiamine deficiency 05/07/2022   Deficiency anemia 05/04/2022   Gastroesophageal reflux disease with esophagitis without hemorrhage 05/04/2022   Esophageal dysphagia 05/04/2022   Intermittent palpitations 05/04/2022   OAB (overactive bladder) 12/31/2021   Encounter for general adult medical examination with abnormal findings 08/26/2021   Obesity, morbid, BMI 40.0-49.9 ([place]) 08/26/2021   PAF (paroxysmal atrial fibrillation) ([place]) 08/26/2021   GERD without esophagitis 10/04/2019   Vitamin D deficiency disease 09/25/2019   Hypercoagulable state ([place]) 09/24/2019   Iron deficiency anemia 12/07/2018   Other dietary vitamin B12 deficiency anemia 12/07/2018   Visit for screening mammogram 04/18/2017   Hyperlipidemia with target LDL less than 130 10/19/2016   Essential hypertension 01/13/2015   Chronic pulmonary embolism ([place]) 12/05/2008    Past Medical History:  Diagnosis Date   Atypical chest pain    normal myoview 11/11/10   Blood transfusion without reported diagnosis    DVT (deep venous thrombosis) (HCC)    recurrent; chronically anticoagulated with coumadin   Family history of anesthesia complication    Sister had decrease in respirations after surgery   Heart murmur    Herniated lumbar intervertebral disc    Hypertension    Paroxysmal atrial fibrillation ([place])  SINCE AGE 24    PE (pulmonary embolism)    chronically anticoagulated with coumadin   Pneumonia 07/20/12   PONV (postoperative nausea and vomiting)    Rheumatoid arthritis (HCC)    Sickle cell trait (HCC)    Sinus congestion 08/14/2018   C/O NASAL/CHEST   CONGESTION, COUGHING WITH PHLEGM, PER PATIENT , SHE IS TO BEGIN TAKING AUGMENTIN DOSE PACK TODAY;     Strep throat at age 66   The patient reports a severe strep throat infection at age 14 and   is not clear as to whether or not she may have had rheumatic fever.   Symptomatic premature ventricular contractions    improved s/p ablation   Urgency of urination    Uterine bleeding 2008   Uterine artery embolization in 2008 for uterine bleeding.     Family History  Problem Relation Age of Onset   Hypertension Mother    Diabetes Mother    Uterine cancer Sister    Prostate cancer Paternal Grandmother    Colon cancer Paternal Grandfather    Prostate cancer Brother    Prostate cancer Brother    Prostate cancer Brother    Healthy Son    Breast cancer Neg Hx    Past Surgical History:  Procedure Laterality Date   COLONOSCOPY W/ POLYPECTOMY     Diagnostic D&C hysteroscopy and Novasure ablation  03/17/2004   DILATION AND CURETTAGE OF UTERUS  12/17/2011   Procedure: DILATATION AND CURETTAGE;  Surgeon: Frederico Hamman, MD;  Location: [facility];  Service: Gynecology;  Laterality: N/A;  With Attempted Hydrothermal Ablation   LUMBAR LAMINECTOMY/DECOMPRESSION MICRODISCECTOMY Left 02/21/2013   Procedure: LUMBAR LAMINECTOMY/DECOMPRESSION MICRODISCECTOMY 1 LEVEL;  Surgeon: Winfield Cunas, MD;  Location: [facility];  Service: Neurosurgery;  Laterality: Left;  LEFT Lumbar Three-Four Laminotomy foraminotomy microdiskectomy   MANDIBLE SURGERY     under bite   PVC Ablation  2010   ROBOTIC ASSISTED TOTAL HYSTERECTOMY WITH BILATERAL SALPINGO OOPHERECTOMY Bilateral 08/21/2018   Procedure: XI ROBOTIC ASSISTED TOTAL HYSTERECTOMY WITH BILATERAL SALPINGO OOPHORECTOMY;  Surgeon: Everitt Amber, MD;  Location: [facility];  Service: Gynecology;  Laterality: Bilateral;   SPINE SURGERY     TONSILLECTOMY     UTERINE ARTERY EMBOLIZATION  2008   for uterine bleeding   VASCULAR SURGERY     laser of left leg    Social History   Social History Narrative   Works as a Marine scientist at [employer] History  Administered Date(s) Administered   Influenza,inj,Quad PF,6+ Mos 06/14/2019   Influenza-Unspecified 05/05/2021   PFIZER(Purple Top)SARS-COV-2 Vaccination 11/08/2019, 11/30/2019, 08/19/2020   PNEUMOCOCCAL CONJUGATE-20 08/26/2021   Tdap 02/03/2015   Zoster Recombinat (Shingrix) 08/26/2021     Objective: Vital Signs: BP (!) 143/83 (BP Location: Left Arm, Patient Position: Sitting, Cuff Size: Large)   Pulse 64   Ht '5\' 9"'$  (1.753 m)   Wt 281 lb 12.8 oz (127.8 kg)   BMI 41.61 kg/m    Physical Exam Vitals and nursing note reviewed.  Constitutional:      Appearance: She is well-developed.  HENT:     Head: Normocephalic and atraumatic.  Eyes:     Conjunctiva/sclera: Conjunctivae normal.  Cardiovascular:     Rate and Rhythm: Normal rate and regular rhythm.     Heart sounds: Normal heart sounds.  Pulmonary:     Effort: Pulmonary effort is normal.     Breath sounds: Normal breath sounds.  Abdominal:     General:  Bowel sounds are normal.     Palpations: Abdomen is soft.  Musculoskeletal:     Cervical back: Normal range of motion.  Lymphadenopathy:     Cervical: No cervical adenopathy.  Skin:    General: Skin is warm and dry.     Capillary Refill: Capillary refill takes less than 2 seconds.  Neurological:     Mental Status: She is alert and oriented to person, place, and time.  Psychiatric:        Behavior: Behavior normal.      Musculoskeletal Exam: Apical spine was in good range of motion.  Shoulder joints, elbow joints, wrist joints were in good range of motion.  There was no synovitis over MCPs PIPs or DIPs.  She had left ring finger flexor tendon thickening consistent with trigger finger.  Hip joints and knee joints in good range of motion.  There was no tenderness over ankles or MTPs.  CDAI Exam: CDAI Score: -- Patient Global: 5 mm; Provider Global: 2  mm Swollen: --; Tender: -- Joint Exam 11/04/2022   No joint exam has been documented for this visit   There is currently no information documented on the homunculus. Go to the Rheumatology activity and complete the homunculus joint exam.  Investigation: No additional findings.  Imaging: MM DIAG BREAST TOMO BILATERAL  Result Date: 10/25/2022 CLINICAL DATA:  67 year old female presenting for follow-up of 2 likely benign left breast masses. Of note, the patient has rheumatoid arthritis. EXAM: DIGITAL DIAGNOSTIC BILATERAL MAMMOGRAM WITH TOMOSYNTHESIS; ULTRASOUND LEFT BREAST LIMITED TECHNIQUE: Bilateral digital diagnostic mammography and breast tomosynthesis was performed.; Targeted ultrasound examination of the left breast was performed. COMPARISON:  Previous exam(s). ACR Breast Density Category b: There are scattered areas of fibroglandular density. FINDINGS: The mass in the lower-inner quadrant of the left breast, likely corresponding with the mass on ultrasound at 8 o'clock appears slightly more prominent with possible associated distortion. The mass further posterior in the medial left breast is not clearly visualized on today's exam. No new suspicious calcifications, masses or areas of distortion are seen in the bilateral breasts. Ultrasound targeted to the left breast at 8 o'clock, 1 cm from the nipple demonstrates an irregular hypoechoic mass measuring 1.1 x 0.7 x 1.0 cm, previously measuring 0.8 x 0.5 x 0.7 mm in March of 2023. Ultrasound targeted to the left breast at 8 o'clock, 5 cm from the nipple demonstrates a slightly larger hypoechoic oval mass measuring 0.7 x 0.4 x 0.6 cm, previously 0.5 x 0.3 x 0.4 cm. Ultrasound of the left axilla demonstrates several mildly prominent left axillary lymph nodes with cortices measuring up to 5 mm. IMPRESSION: 1. Mild interval increase in size of the 2 masses in the left breast at 8 o'clock. 2.  Multiple prominent left axillary lymph nodes. 3.  No evidence  of right breast malignancy. RECOMMENDATION: Ultrasound guided biopsy is recommended for the 2 masses in the left breast and the largest of the left axillary lymph nodes. I have discussed the findings and recommendations with the patient. If applicable, a reminder letter will be sent to the patient regarding the next appointment. BI-RADS CATEGORY  4: Suspicious. Electronically Signed   By: Ammie Ferrier M.D.   On: 10/25/2022 12:22  US BREAST LTD UNI LEFT INC AXILLA  Result Date: 10/25/2022 CLINICAL DATA:  67 year old female presenting for follow-up of 2 likely benign left breast masses. Of note, the patient has rheumatoid arthritis. EXAM: DIGITAL DIAGNOSTIC BILATERAL MAMMOGRAM WITH TOMOSYNTHESIS; ULTRASOUND LEFT BREAST LIMITED TECHNIQUE: Bilateral  digital diagnostic mammography and breast tomosynthesis was performed.; Targeted ultrasound examination of the left breast was performed. COMPARISON:  Previous exam(s). ACR Breast Density Category b: There are scattered areas of fibroglandular density. FINDINGS: The mass in the lower-inner quadrant of the left breast, likely corresponding with the mass on ultrasound at 8 o'clock appears slightly more prominent with possible associated distortion. The mass further posterior in the medial left breast is not clearly visualized on today's exam. No new suspicious calcifications, masses or areas of distortion are seen in the bilateral breasts. Ultrasound targeted to the left breast at 8 o'clock, 1 cm from the nipple demonstrates an irregular hypoechoic mass measuring 1.1 x 0.7 x 1.0 cm, previously measuring 0.8 x 0.5 x 0.7 mm in March of 2023. Ultrasound targeted to the left breast at 8 o'clock, 5 cm from the nipple demonstrates a slightly larger hypoechoic oval mass measuring 0.7 x 0.4 x 0.6 cm, previously 0.5 x 0.3 x 0.4 cm. Ultrasound of the left axilla demonstrates several mildly prominent left axillary lymph nodes with cortices measuring up to 5 mm. IMPRESSION: 1.  Mild interval increase in size of the 2 masses in the left breast at 8 o'clock. 2.  Multiple prominent left axillary lymph nodes. 3.  No evidence of right breast malignancy. RECOMMENDATION: Ultrasound guided biopsy is recommended for the 2 masses in the left breast and the largest of the left axillary lymph nodes. I have discussed the findings and recommendations with the patient. If applicable, a reminder letter will be sent to the patient regarding the next appointment. BI-RADS CATEGORY  4: Suspicious. Electronically Signed   By: Ammie Ferrier M.D.   On: 10/25/2022 12:22   Recent Labs: Lab Results  Component Value Date   WBC 6.3 10/22/2022   HGB 10.7 (L) 10/22/2022   PLT 262 10/22/2022   NA 139 10/22/2022   K 4.3 10/22/2022   CL 107 10/22/2022   CO2 23 10/22/2022   GLUCOSE 91 10/22/2022   BUN 21 10/22/2022   CREATININE 1.18 (H) 10/22/2022   BILITOT 0.4 10/22/2022   ALKPHOS 77 11/19/2021   AST 14 10/22/2022   ALT 9 10/22/2022   PROT 6.7 10/22/2022   ALBUMIN 4.3 11/19/2021   CALCIUM 9.0 10/22/2022   GFRAA 72 11/13/2020   QFTBGOLDPLUS NEGATIVE 01/15/2019    Speciality Comments: PLQ Eye Exam 09/21/2021 WNL @ [facility]  Follow up in 6 months   Procedures:  Hand/UE Inj: R ring A1 for trigger finger on 11/04/2022 1:15 PM Indications: pain, tendon swelling and therapeutic Details: 27 G needle, ultrasound-guided volar approach Medications: 0.5 mL lidocaine 1 %; 10 mg triamcinolone acetonide 40 MG/ML Aspirate: 0 mL Procedure, treatment alternatives, risks and benefits explained, specific risks discussed. Immediately prior to procedure a time out was called to verify the correct patient, procedure, equipment, support staff and site/side marked as required. Patient was prepped and draped in the usual sterile fashion.     Allergies: Methylprednisolone, Sulfa antibiotics, Sulfonamide derivatives, and Macrobid [nitrofurantoin]   Assessment / Plan:     Visit Diagnoses:  Rheumatoid arthritis with rheumatoid factor of multiple sites without organ or systems involvement (HCC) - RF 74, CCP >250 ,Sed rate 45, ANA 1: 320 cytoplasmic, positive synovitis: She had no synovitis on examination today.  She has been taking hydroxychloroquine 200 mg p.o. twice daily Monday to Friday without any interruption.  She complains of discomfort in her left ring finger which has been triggering.  None of the joints were swollen.  High risk medication use - Plaquenil 200 mg 1 tablet by mouth twice daily M-F-Started in May 2020. PLQ Eye Exam 09/21/2021, eye examination is a scheduled on December 26, 2022.  Labs obtained on October 22, 2022 CBC showed hemoglobin 10.7 and creatinine 1.18.  I advised her to discuss labs with her PCP.  Elevated serum creatinine-creatinine has been elevated and stable since August 2023.  Chronic pain of both shoulders-she continues to have some stiffness in her shoulders.  Her shoulder joints were in good range of motion.  Trigger finger, left ring finger-left ring finger flexor tendon thickening was noted.  Patient had a good response to right middle trigger finger injection in the past.  She request trigger finger injection to her left ring finger.  Indications side effects contraindications were discussed at length.  Patient wants to proceed with the injection.  Primary osteoarthritis of both hands-she continues to have some stiffness in her hands.  No synovitis was noted.  Trochanteric bursitis of both hips-she has intermittent discomfort.  IT band stretches were discussed.  Primary osteoarthritis of both knees-she continues to have some discomfort in her knee joints.  No warmth swelling or effusion was noted.  Primary osteoarthritis of both feet-proper fitting shoes were advised.  Positive ANA (antinuclear antibody) - ENA, C3-C4, anticardiolipin, lupus anticoagulant, beta-2 GP 1-were all negative.  She has no other clinical features of autoimmune  disease.  Other medical problems are listed as follows:  History of pulmonary embolism  History of DVT (deep vein thrombosis)  Hyperlipidemia with target LDL less than 130  History of anemia  PVC's (premature ventricular contractions)  Essential hypertension-blood pressure was elevated today at 161/78.  Peak blood pressure was 143/83.  Patient was advised to monitor blood pressure closely and follow-up with her PCP.  History of atrial fibrillation  Vitamin D deficiency  Orders: Orders Placed This Encounter  Procedures   Hand/UE Inj: R ring A1   US Guided Needle Placement   Meds ordered this encounter  Medications   hydroxychloroquine (PLAQUENIL) 200 MG tablet    Sig: Take 1 tablet (200 mg total) by mouth 2 (two) times daily.    Dispense:  180 tablet    Refill:  0     Follow-Up Instructions: Return in about 5 months (around 04/06/2023) for Rheumatoid arthritis.   Bo Merino, MD  Note - This record has been created using Editor, commissioning.  Chart creation errors have been sought, but may not always  have been located. Such creation errors do not reflect on  the standard of medical care.

## 2022-10-21 NOTE — Telephone Encounter (Signed)
Patient called the office requesting a refill of Plaquenil to be sent to the [facility].

## 2022-10-22 ENCOUNTER — Other Ambulatory Visit: Payer: Self-pay | Admitting: *Deleted

## 2022-10-22 DIAGNOSIS — Z79899 Other long term (current) drug therapy: Secondary | ICD-10-CM

## 2022-10-23 LAB — CBC WITH DIFFERENTIAL/PLATELET
Absolute Monocytes: 435 cells/uL (ref 200–950)
Basophils Absolute: 50 cells/uL (ref 0–200)
Basophils Relative: 0.8 %
Eosinophils Absolute: 139 cells/uL (ref 15–500)
Eosinophils Relative: 2.2 %
HCT: 32.1 % — ABNORMAL LOW (ref 35.0–45.0)
Hemoglobin: 10.7 g/dL — ABNORMAL LOW (ref 11.7–15.5)
Lymphs Abs: 1562 cells/uL (ref 850–3900)
MCH: 26.7 pg — ABNORMAL LOW (ref 27.0–33.0)
MCHC: 33.3 g/dL (ref 32.0–36.0)
MCV: 80 fL (ref 80.0–100.0)
MPV: 10.7 fL (ref 7.5–12.5)
Monocytes Relative: 6.9 %
Neutro Abs: 4114 cells/uL (ref 1500–7800)
Neutrophils Relative %: 65.3 %
Platelets: 262 10*3/uL (ref 140–400)
RBC: 4.01 10*6/uL (ref 3.80–5.10)
RDW: 13.8 % (ref 11.0–15.0)
Total Lymphocyte: 24.8 %
WBC: 6.3 10*3/uL (ref 3.8–10.8)

## 2022-10-23 LAB — COMPLETE METABOLIC PANEL WITH GFR
AG Ratio: 1.3 (calc) (ref 1.0–2.5)
ALT: 9 U/L (ref 6–29)
AST: 14 U/L (ref 10–35)
Albumin: 3.8 g/dL (ref 3.6–5.1)
Alkaline phosphatase (APISO): 85 U/L (ref 37–153)
BUN/Creatinine Ratio: 18 (calc) (ref 6–22)
BUN: 21 mg/dL (ref 7–25)
CO2: 23 mmol/L (ref 20–32)
Calcium: 9 mg/dL (ref 8.6–10.4)
Chloride: 107 mmol/L (ref 98–110)
Creat: 1.18 mg/dL — ABNORMAL HIGH (ref 0.50–1.05)
Globulin: 2.9 g/dL (calc) (ref 1.9–3.7)
Glucose, Bld: 91 mg/dL (ref 65–99)
Potassium: 4.3 mmol/L (ref 3.5–5.3)
Sodium: 139 mmol/L (ref 135–146)
Total Bilirubin: 0.4 mg/dL (ref 0.2–1.2)
Total Protein: 6.7 g/dL (ref 6.1–8.1)
eGFR: 51 mL/min/{1.73_m2} — ABNORMAL LOW (ref >=60)

## 2022-10-25 ENCOUNTER — Ambulatory Visit
Admission: RE | Admit: 2022-10-25 | Discharge: 2022-10-25 | Disposition: A | Discharge status: Home / Self Care | Payer: 59 | Source: Ambulatory Visit | Attending: Internal Medicine | Admitting: Internal Medicine

## 2022-10-25 ENCOUNTER — Ambulatory Visit
Admission: RE | Admit: 2022-10-25 | Discharge: 2022-10-25 | Disposition: A | Discharge status: Home / Self Care | Payer: Medicare Other | Source: Ambulatory Visit | Attending: Internal Medicine | Admitting: Internal Medicine

## 2022-10-25 ENCOUNTER — Other Ambulatory Visit: Payer: Self-pay | Admitting: Internal Medicine

## 2022-10-25 DIAGNOSIS — N6320 Unspecified lump in the left breast, unspecified quadrant: Secondary | ICD-10-CM

## 2022-10-25 DIAGNOSIS — N6324 Unspecified lump in the left breast, lower inner quadrant: Secondary | ICD-10-CM | POA: Diagnosis not present

## 2022-10-25 DIAGNOSIS — R928 Other abnormal and inconclusive findings on diagnostic imaging of breast: Secondary | ICD-10-CM | POA: Diagnosis not present

## 2022-10-25 DIAGNOSIS — R599 Enlarged lymph nodes, unspecified: Secondary | ICD-10-CM

## 2022-10-25 NOTE — Progress Notes (Signed)
Hemoglobin is low . Patient should take MVI with iron. Cr is  mildly elevated and stable. Please forward results to her PCP for the evaluation.

## 2022-11-04 ENCOUNTER — Ambulatory Visit: Payer: Medicare Other | Attending: Rheumatology | Admitting: Rheumatology

## 2022-11-04 ENCOUNTER — Encounter: Payer: Self-pay | Admitting: Rheumatology

## 2022-11-04 ENCOUNTER — Other Ambulatory Visit (HOSPITAL_COMMUNITY): Payer: Self-pay

## 2022-11-04 ENCOUNTER — Ambulatory Visit: Payer: Medicare Other

## 2022-11-04 VITALS — BP 143/83 | HR 64 | Ht 69.0 in | Wt 281.8 lb

## 2022-11-04 DIAGNOSIS — M7062 Trochanteric bursitis, left hip: Secondary | ICD-10-CM

## 2022-11-04 DIAGNOSIS — M19041 Primary osteoarthritis, right hand: Secondary | ICD-10-CM | POA: Diagnosis present

## 2022-11-04 DIAGNOSIS — M25511 Pain in right shoulder: Secondary | ICD-10-CM

## 2022-11-04 DIAGNOSIS — Z8679 Personal history of other diseases of the circulatory system: Secondary | ICD-10-CM

## 2022-11-04 DIAGNOSIS — M19071 Primary osteoarthritis, right ankle and foot: Secondary | ICD-10-CM

## 2022-11-04 DIAGNOSIS — I493 Ventricular premature depolarization: Secondary | ICD-10-CM | POA: Diagnosis present

## 2022-11-04 DIAGNOSIS — Z86718 Personal history of other venous thrombosis and embolism: Secondary | ICD-10-CM | POA: Diagnosis present

## 2022-11-04 DIAGNOSIS — M170 Bilateral primary osteoarthritis of knee: Secondary | ICD-10-CM

## 2022-11-04 DIAGNOSIS — M0579 Rheumatoid arthritis with rheumatoid factor of multiple sites without organ or systems involvement: Secondary | ICD-10-CM

## 2022-11-04 DIAGNOSIS — I10 Essential (primary) hypertension: Secondary | ICD-10-CM

## 2022-11-04 DIAGNOSIS — R7989 Other specified abnormal findings of blood chemistry: Secondary | ICD-10-CM | POA: Diagnosis present

## 2022-11-04 DIAGNOSIS — E559 Vitamin D deficiency, unspecified: Secondary | ICD-10-CM | POA: Diagnosis present

## 2022-11-04 DIAGNOSIS — G8929 Other chronic pain: Secondary | ICD-10-CM | POA: Diagnosis present

## 2022-11-04 DIAGNOSIS — Z862 Personal history of diseases of the blood and blood-forming organs and certain disorders involving the immune mechanism: Secondary | ICD-10-CM

## 2022-11-04 DIAGNOSIS — Z79899 Other long term (current) drug therapy: Secondary | ICD-10-CM | POA: Diagnosis present

## 2022-11-04 DIAGNOSIS — M25512 Pain in left shoulder: Secondary | ICD-10-CM

## 2022-11-04 DIAGNOSIS — Z86711 Personal history of pulmonary embolism: Secondary | ICD-10-CM

## 2022-11-04 DIAGNOSIS — M65342 Trigger finger, left ring finger: Secondary | ICD-10-CM | POA: Diagnosis present

## 2022-11-04 DIAGNOSIS — M19072 Primary osteoarthritis, left ankle and foot: Secondary | ICD-10-CM

## 2022-11-04 DIAGNOSIS — M19042 Primary osteoarthritis, left hand: Secondary | ICD-10-CM | POA: Diagnosis present

## 2022-11-04 DIAGNOSIS — E785 Hyperlipidemia, unspecified: Secondary | ICD-10-CM

## 2022-11-04 DIAGNOSIS — R768 Other specified abnormal immunological findings in serum: Secondary | ICD-10-CM

## 2022-11-04 DIAGNOSIS — M7061 Trochanteric bursitis, right hip: Secondary | ICD-10-CM | POA: Diagnosis present

## 2022-11-04 MED ORDER — HYDROXYCHLOROQUINE SULFATE 200 MG PO TABS
200.0000 mg | ORAL_TABLET | Freq: Two times a day (BID) | ORAL | 0 refills | Status: DC
  Filled 2022-11-04: qty 180, 90d supply, fill #0

## 2022-11-04 MED ORDER — LIDOCAINE HCL 1 % IJ SOLN
0.5000 mL | INTRAMUSCULAR | Status: AC | PRN
  Administered 2022-11-04: .5 mL

## 2022-11-04 MED ORDER — TRIAMCINOLONE ACETONIDE 40 MG/ML IJ SUSP
10.0000 mg | INTRAMUSCULAR | Status: AC | PRN
  Administered 2022-11-04: 10 mg

## 2022-11-04 NOTE — Patient Instructions (Signed)
Standing Labs We placed an order today for your standing lab work.   Please have your standing labs drawn in August  Please have your labs drawn 2 weeks prior to your appointment so that the provider can discuss your lab results at your appointment, if possible.  Please note that you may see your imaging and lab results in [place] before we have reviewed them. We will contact you once all results are reviewed. Please allow our office up to 72 hours to thoroughly review all of the results before contacting the office for clarification of your results.  WALK-IN LAB HOURS  Monday through Thursday from 8:00 am -12:30 pm and 1:00 pm-5:00 pm and Friday from 8:00 am-12:00 pm.  Patients with office visits requiring labs will be seen before walk-in labs.  You may encounter longer than normal wait times. Please allow additional time. Wait times may be shorter on  Monday and Thursday afternoons.  We do not book appointments for walk-in labs. We appreciate your patience and understanding with our staff.   Labs are drawn by [facility]. Please bring your co-pay at the time of your lab draw.  You may receive a bill from [facility] for your lab work.  Please note if you are on Hydroxychloroquine and and an order has been placed for a Hydroxychloroquine level,  you will need to have it drawn 4 hours or more after your last dose.  If you wish to have your labs drawn at another location, please call the office 24 hours in advance so we can fax the orders.  The office is located at [address] [place], [place], [place] [address]   If you have any questions regarding directions or hours of operation,  please call [phone].   As a reminder, please drink plenty of water prior to coming for your lab work. Thanks!   Vaccines You are taking a medication(s) that can suppress your immune system.  The following immunizations are recommended: Flu annually Covid-19  Td/Tdap (tetanus, diphtheria, pertussis) every  10 years Pneumonia (Prevnar 15 then Pneumovax 23 at least 1 year apart.  Alternatively, can take Prevnar 20 without needing additional dose) Shingrix: 2 doses from 4 weeks to 6 months apart  Please check with your PCP to make sure you are up to date.

## 2022-11-08 ENCOUNTER — Ambulatory Visit
Admission: RE | Admit: 2022-11-08 | Discharge: 2022-11-08 | Disposition: A | Discharge status: Home / Self Care | Payer: Medicare Other | Source: Ambulatory Visit | Attending: Internal Medicine | Admitting: Internal Medicine

## 2022-11-08 ENCOUNTER — Other Ambulatory Visit (HOSPITAL_COMMUNITY): Payer: Self-pay

## 2022-11-08 DIAGNOSIS — N6320 Unspecified lump in the left breast, unspecified quadrant: Secondary | ICD-10-CM

## 2022-11-08 DIAGNOSIS — R599 Enlarged lymph nodes, unspecified: Secondary | ICD-10-CM

## 2022-11-08 HISTORY — PX: BREAST BIOPSY: SHX20

## 2022-11-12 ENCOUNTER — Telehealth: Payer: Self-pay | Admitting: Hematology and Oncology

## 2022-11-12 NOTE — Telephone Encounter (Signed)
Spoke to patient to confirm upcoming afternoon [facility] clinic appointment on 3/20, paperwork will be sent via mail.   Gave location and time, also informed patient that the surgeon's office would be calling as well to get information from them similar to the packet that they will be receiving so make sure to do both.  Reminded patient that all providers will be coming to the clinic to see them HERE and if they had any questions to not hesitate to reach back out to myself or their navigators.

## 2022-11-15 DIAGNOSIS — C50512 Malignant neoplasm of lower-outer quadrant of left female breast: Secondary | ICD-10-CM | POA: Insufficient documentation

## 2022-11-15 DIAGNOSIS — Z170 Estrogen receptor positive status [ER+]: Secondary | ICD-10-CM | POA: Insufficient documentation

## 2022-11-15 NOTE — Progress Notes (Signed)
Radiation Oncology         [phone] ________________________________  Name: Pamela Davenport        MRN: [id number]  Date of Service: 11/17/2022 DOB: Dec 28, 1955  [id number], Arvid Right, MD  Stark Klein, MD     REFERRING PHYSICIAN: Stark Klein, MD   DIAGNOSIS: There were no encounter diagnoses.   HISTORY OF PRESENT ILLNESS: Pamela Davenport is a 67 y.o. female seen in the multidisciplinary breast clinic for a new diagnosis of left breast cancer. The patient has been followed with diagnostic imaging given a history of left breast masses that have been felt to be benign for several years.  She returned for diagnostic imaging in February 2024 which showed the lesion in the 8 o'clock position of the left breast had become slightly more prominent with possible associated distortion.  The second lesion was not well-visualized by mammography.  Targeted ultrasound was then performed.  In the left breast at 8:00 1 cm from the nipple there was a hypoechoic mass measuring 1.1 cm, a year ago this area measured 8 mm.  In addition also in the 8 o'clock position was a 7 mm oval mass, last March it had been 5 mm.  There were several mildly prominent left axillary lymph nodes seen by ultrasound as well.  She underwent biopsies on 11/08/2022.  The lesion 1 cm from the nipple in the 8 o'clock position showed a grade 1 invasive ductal carcinoma that was ER/PR positive HER2 negative with a Ki-67 of 10%.  A second biopsy 5 cm from the nipple in the 8 o'clock position showed grade 1 invasive ductal carcinoma as well, the specimen was also ER/PR positive, HER2 negative with a Ki-67 of 10%.  Her axillary lymph node specimen was negative for metastatic disease.  She is seen today to discuss treatment of her cancer.    PREVIOUS RADIATION THERAPY: {EXAM; YES/NO:19492::"No"}   PAST MEDICAL HISTORY:  Past Medical History:  Diagnosis Date   Atypical chest pain    normal myoview 11/11/10   Blood transfusion without  reported diagnosis    DVT (deep venous thrombosis) (HCC)    recurrent; chronically anticoagulated with coumadin   Family history of anesthesia complication    Sister had decrease in respirations after surgery   Heart murmur    Herniated lumbar intervertebral disc    Hypertension    Paroxysmal atrial fibrillation ([place])    SINCE AGE 87    PE (pulmonary embolism)    chronically anticoagulated with coumadin   Pneumonia 07/20/12   PONV (postoperative nausea and vomiting)    Rheumatoid arthritis (HCC)    Sickle cell trait (HCC)    Sinus congestion 08/14/2018   C/O NASAL/CHEST  CONGESTION, COUGHING WITH PHLEGM, PER PATIENT , SHE IS TO BEGIN TAKING AUGMENTIN DOSE PACK TODAY;     Strep throat at age 52   The patient reports a severe strep throat infection at age 47 and   is not clear as to whether or not she may have had rheumatic fever.   Symptomatic premature ventricular contractions    improved s/p ablation   Urgency of urination    Uterine bleeding 2008   Uterine artery embolization in 2008 for uterine bleeding.        PAST SURGICAL HISTORY: Past Surgical History:  Procedure Laterality Date   BREAST BIOPSY Left 11/08/2022   [place] LT BREAST BX W LOC DEV EA ADD LESION IMG BX SPEC US GUIDE 11/08/2022 [facility]   BREAST  BIOPSY Left 11/08/2022   [place] LT BREAST BX W LOC DEV 1ST LESION IMG BX SPEC US GUIDE 11/08/2022 [facility]   COLONOSCOPY W/ POLYPECTOMY     Diagnostic D&C hysteroscopy and Novasure ablation  03/17/2004   DILATION AND CURETTAGE OF UTERUS  12/17/2011   Procedure: DILATATION AND CURETTAGE;  Surgeon: Frederico Hamman, MD;  Location: [facility];  Service: Gynecology;  Laterality: N/A;  With Attempted Hydrothermal Ablation   LUMBAR LAMINECTOMY/DECOMPRESSION MICRODISCECTOMY Left 02/21/2013   Procedure: LUMBAR LAMINECTOMY/DECOMPRESSION MICRODISCECTOMY 1 LEVEL;  Surgeon: Winfield Cunas, MD;  Location: [facility];  Service: Neurosurgery;  Laterality: Left;  LEFT Lumbar  Three-Four Laminotomy foraminotomy microdiskectomy   MANDIBLE SURGERY     under bite   PVC Ablation  2010   ROBOTIC ASSISTED TOTAL HYSTERECTOMY WITH BILATERAL SALPINGO OOPHERECTOMY Bilateral 08/21/2018   Procedure: XI ROBOTIC ASSISTED TOTAL HYSTERECTOMY WITH BILATERAL SALPINGO OOPHORECTOMY;  Surgeon: Everitt Amber, MD;  Location: [facility];  Service: Gynecology;  Laterality: Bilateral;   SPINE SURGERY     TONSILLECTOMY     UTERINE ARTERY EMBOLIZATION  2008   for uterine bleeding   VASCULAR SURGERY     laser of left leg     FAMILY HISTORY:  Family History  Problem Relation Age of Onset   Hypertension Mother    Diabetes Mother    Uterine cancer Sister    Prostate cancer Paternal Grandmother    Colon cancer Paternal Grandfather    Prostate cancer Brother    Prostate cancer Brother    Prostate cancer Brother    Healthy Son    Breast cancer Neg Hx      SOCIAL HISTORY:  reports that she quit smoking about 43 years ago. Her smoking use included cigarettes. She has a 2.00 pack-year smoking history. She has never been exposed to tobacco smoke. She has never used smokeless tobacco. She reports current alcohol use. She reports that she does not use drugs.  The patient is single and lives in [place].  She works as a Marine scientist at [employer] ***   ALLERGIES: Methylprednisolone, Sulfa antibiotics, Sulfonamide derivatives, and Macrobid [nitrofurantoin]   MEDICATIONS:  Current Outpatient Medications  Medication Sig Dispense Refill   clindamycin (CLEOCIN) 300 MG capsule Take 1 capsule (300 mg total) by mouth 3 (three) times daily. (Patient not taking: Reported on 11/04/2022) 21 capsule 2   clindamycin (CLINDAGEL) 1 % gel Apply 1 application topically every day (Patient not taking: Reported on 11/04/2022) 60 g 5   esomeprazole (NEXIUM) 40 MG capsule Take 1 capsule (40 mg total) by mouth daily. 90 capsule 1   flecainide (TAMBOCOR) 150 MG tablet Take 2 tablets by mouth at the onset  of AFIB 30 tablet 3   hydroxychloroquine (PLAQUENIL) 200 MG tablet Take 1 tablet (200 mg total) by mouth 2 (two) times daily. 180 tablet 0   hydrOXYzine (ATARAX) 25 MG tablet Take 1 tablet (25 mg total) by mouth 3 (three) times daily as needed. 30 tablet 2   indapamide (LOZOL) 1.25 MG tablet Take 1 tablet (1.25 mg total) by mouth daily. 90 tablet 0   losartan (COZAAR) 100 MG tablet Take 1 tablet (100 mg total) by mouth daily. 90 tablet 3   nadolol (CORGARD) 20 MG tablet Take 1 tablet (20 mg total) by mouth daily. 90 tablet 3   nystatin-triamcinolone ointment (MYCOLOG) Apply to affected areas two times a day 20 g 2   Omega-3 Fatty Acids (OMEGA 3 PO) Take 1 tablet by  mouth daily. (Patient not taking: Reported on 11/04/2022)     polyethylene glycol-electrolytes (NULYTELY) 420 g solution Use as directed for 2 day prep. (Patient not taking: Reported on 11/04/2022) 8000 mL 0   rivaroxaban (XARELTO) 20 MG TABS tablet Take 1 tablet (20 mg total) by mouth daily with supper. 90 tablet 3   TART CHERRY PO Take 2 tablets by mouth daily.     thiamine (VITAMIN B-1) 50 MG tablet Take 1 tablet (50 mg total) by mouth daily. (Patient not taking: Reported on 11/04/2022) 90 tablet 1   No current facility-administered medications for this visit.     REVIEW OF SYSTEMS: On review of systems, the patient reports that she is doing ***     PHYSICAL EXAM:  Wt Readings from Last 3 Encounters:  11/04/22 281 lb 12.8 oz (127.8 kg)  05/04/22 284 lb (128.8 kg)  04/27/22 284 lb 9.6 oz (129.1 kg)   Temp Readings from Last 3 Encounters:  05/04/22 98.2 F (36.8 C) (Oral)  02/03/22 98.6 F (37 C) (Oral)  12/31/21 97.7 F (36.5 C) (Oral)   BP Readings from Last 3 Encounters:  11/04/22 (!) 143/83  05/04/22 136/74  04/27/22 138/81   Pulse Readings from Last 3 Encounters:  11/04/22 64  05/04/22 79  04/27/22 69    In general this is a well appearing African-American female in no acute distress. She's alert and oriented  x4 and appropriate throughout the examination. Cardiopulmonary assessment is negative for acute distress and she exhibits normal effort. Bilateral breast exam is deferred.    ECOG = ***  0 - Asymptomatic (Fully active, able to carry on all predisease activities without restriction)  1 - Symptomatic but completely ambulatory (Restricted in physically strenuous activity but ambulatory and able to carry out work of a light or sedentary nature. For example, light housework, office work)  2 - Symptomatic, <50% in bed during the day (Ambulatory and capable of all self care but unable to carry out any work activities. Up and about more than 50% of waking hours)  3 - Symptomatic, >50% in bed, but not bedbound (Capable of only limited self-care, confined to bed or chair 50% or more of waking hours)  4 - Bedbound (Completely disabled. Cannot carry on any self-care. Totally confined to bed or chair)  5 - Death   Eustace Pen MM, Creech RH, Tormey DC, et al. [phone]). "Toxicity and response criteria of the [facility]". [place] Oncol. 5 (6): 649-55    LABORATORY DATA:  Lab Results  Component Value Date   WBC 6.3 10/22/2022   HGB 10.7 (L) 10/22/2022   HCT 32.1 (L) 10/22/2022   MCV 80.0 10/22/2022   PLT 262 10/22/2022   Lab Results  Component Value Date   NA 139 10/22/2022   K 4.3 10/22/2022   CL 107 10/22/2022   CO2 23 10/22/2022   Lab Results  Component Value Date   ALT 9 10/22/2022   AST 14 10/22/2022   ALKPHOS 77 11/19/2021   BILITOT 0.4 10/22/2022      RADIOGRAPHY: [place] AXILLARY NODE CORE BIOPSY LEFT  Addendum Date: 11/10/2022   ADDENDUM REPORT: 11/10/2022 09:49 ADDENDUM: Pathology revealed GRADE 1 INVASIVE DUCTAL CARCINOMA of the LEFT breast, 8 o'clock, 1 cmfn, (ribbon clip). This was found to be concordant by Dr. Lillia Mountain. Pathology revealed GRADE 1 INVASIVE DUCTAL CARCINOMA of the LEFT breast, 8 o'clock, 5 cmfn, (heart clip). This was found to be  concordant by Dr. Mordecai Rasmussen  Arceo. Pathology revealed Lymph node, needle/core biopsy, LEFT axilla ONE LYMPH NODE, NEGATIVE FOR CARCINOMA. This was found to be DISCORDANT by Dr. Lillia Mountain, with excision recommended Pathology results were discussed with the patient by telephone. The patient reported doing well after the biopsies with tenderness at the sites. Post biopsy instructions and care were reviewed and questions were answered. The patient was encouraged to call [facility] for any additional concerns. The patient was referred to [facility] at [facility] on 11/17/2022, per patient request. Pathology results reported by Stacie Acres RN on 11/10/2022. Electronically Signed   By: Lillia Mountain M.D.   On: 11/10/2022 09:49   Result Date: 11/10/2022 CLINICAL DATA:  Indeterminate masses in the 8 o'clock region of the left breast 1 cm from the nipple, 8 o'clock region of the left breast 5 cm from the nipple and a left axillary lymph node. EXAM: ULTRASOUND GUIDED LEFT BREAST CORE NEEDLE BIOPSIES COMPARISON:  Previous exam(s). PROCEDURE: I met with the patient and we discussed the procedure of ultrasound-guided biopsy, including benefits and alternatives. We discussed the high likelihood of a successful procedure. We discussed the risks of the procedure, including infection, bleeding, tissue injury, clip migration, and inadequate sampling. Informed written consent was given. The usual time-out protocol was performed immediately prior to the procedure. Lesion quadrant: 8 O'CLOCK 1 CM FROM THE NIPPLE Using sterile technique and 1% Lidocaine as local anesthetic, under direct ultrasound visualization, a 14 gauge spring-loaded device was used to perform biopsy of a mass in the 8 o'clock region of the left breast 1 cm from the nipple using a lateral to medial approach. At the conclusion of the procedure ribbon shaped tissue marker clip was  deployed into the biopsy cavity. Follow up 2 view mammogram was performed and dictated separately. Lesion quadrant: 8 O'CLOCK 5 CM FROM THE NIPPLE Using sterile technique and 1% Lidocaine as local anesthetic, under direct ultrasound visualization, a 14 gauge spring-loaded device was used to perform biopsy of a mass in the 8 o'clock region of the left breast 5 cm from the nipple using a lateral to medial approach. At the conclusion of the procedure heart shaped tissue marker clip was deployed into the biopsy cavity. Follow up 2 view mammogram was performed and dictated separately. Lesion quadrant: LEFT AXILLA Using sterile technique and 1% Lidocaine as local anesthetic, under direct ultrasound visualization, a 14 gauge spring-loaded device was used to perform biopsy of a left axillary lymph node using a lateral to medial approach. At the conclusion of the procedure spiral shaped HydroMARK clip shaped tissue marker clip was deployed into the biopsy cavity. Follow up 2 view mammogram was performed and dictated separately. IMPRESSION: Ultrasound guided biopsies of the left breast and left axilla. No apparent complications. Electronically Signed: By: Lillia Mountain M.D. On: 11/08/2022 13:59  [place] LT BREAST BX W LOC DEV EA ADD LESION IMG BX SPEC US GUIDE  Addendum Date: 11/10/2022   ADDENDUM REPORT: 11/10/2022 09:49 ADDENDUM: Pathology revealed GRADE 1 INVASIVE DUCTAL CARCINOMA of the LEFT breast, 8 o'clock, 1 cmfn, (ribbon clip). This was found to be concordant by Dr. Lillia Mountain. Pathology revealed GRADE 1 INVASIVE DUCTAL CARCINOMA of the LEFT breast, 8 o'clock, 5 cmfn, (heart clip). This was found to be concordant by Dr. Lillia Mountain. Pathology revealed Lymph node, needle/core biopsy, LEFT axilla ONE LYMPH NODE, NEGATIVE FOR CARCINOMA. This was found to be DISCORDANT by Dr. Lillia Mountain, with  excision recommended Pathology results were discussed with the patient by telephone. The patient reported doing well after the  biopsies with tenderness at the sites. Post biopsy instructions and care were reviewed and questions were answered. The patient was encouraged to call [facility] for any additional concerns. The patient was referred to [facility] at [facility] on 11/17/2022, per patient request. Pathology results reported by Stacie Acres RN on 11/10/2022. Electronically Signed   By: Lillia Mountain M.D.   On: 11/10/2022 09:49   Result Date: 11/10/2022 CLINICAL DATA:  Indeterminate masses in the 8 o'clock region of the left breast 1 cm from the nipple, 8 o'clock region of the left breast 5 cm from the nipple and a left axillary lymph node. EXAM: ULTRASOUND GUIDED LEFT BREAST CORE NEEDLE BIOPSIES COMPARISON:  Previous exam(s). PROCEDURE: I met with the patient and we discussed the procedure of ultrasound-guided biopsy, including benefits and alternatives. We discussed the high likelihood of a successful procedure. We discussed the risks of the procedure, including infection, bleeding, tissue injury, clip migration, and inadequate sampling. Informed written consent was given. The usual time-out protocol was performed immediately prior to the procedure. Lesion quadrant: 8 O'CLOCK 1 CM FROM THE NIPPLE Using sterile technique and 1% Lidocaine as local anesthetic, under direct ultrasound visualization, a 14 gauge spring-loaded device was used to perform biopsy of a mass in the 8 o'clock region of the left breast 1 cm from the nipple using a lateral to medial approach. At the conclusion of the procedure ribbon shaped tissue marker clip was deployed into the biopsy cavity. Follow up 2 view mammogram was performed and dictated separately. Lesion quadrant: 8 O'CLOCK 5 CM FROM THE NIPPLE Using sterile technique and 1% Lidocaine as local anesthetic, under direct ultrasound visualization, a 14 gauge spring-loaded device was used to perform biopsy of a mass in  the 8 o'clock region of the left breast 5 cm from the nipple using a lateral to medial approach. At the conclusion of the procedure heart shaped tissue marker clip was deployed into the biopsy cavity. Follow up 2 view mammogram was performed and dictated separately. Lesion quadrant: LEFT AXILLA Using sterile technique and 1% Lidocaine as local anesthetic, under direct ultrasound visualization, a 14 gauge spring-loaded device was used to perform biopsy of a left axillary lymph node using a lateral to medial approach. At the conclusion of the procedure spiral shaped HydroMARK clip shaped tissue marker clip was deployed into the biopsy cavity. Follow up 2 view mammogram was performed and dictated separately. IMPRESSION: Ultrasound guided biopsies of the left breast and left axilla. No apparent complications. Electronically Signed: By: Lillia Mountain M.D. On: 11/08/2022 13:59  [place] LT BREAST BX W LOC DEV 1ST LESION IMG BX SPEC US GUIDE  Addendum Date: 11/10/2022   ADDENDUM REPORT: 11/10/2022 09:49 ADDENDUM: Pathology revealed GRADE 1 INVASIVE DUCTAL CARCINOMA of the LEFT breast, 8 o'clock, 1 cmfn, (ribbon clip). This was found to be concordant by Dr. Lillia Mountain. Pathology revealed GRADE 1 INVASIVE DUCTAL CARCINOMA of the LEFT breast, 8 o'clock, 5 cmfn, (heart clip). This was found to be concordant by Dr. Lillia Mountain. Pathology revealed Lymph node, needle/core biopsy, LEFT axilla ONE LYMPH NODE, NEGATIVE FOR CARCINOMA. This was found to be DISCORDANT by Dr. Lillia Mountain, with excision recommended Pathology results were discussed with the patient by telephone. The patient reported doing well after the biopsies with tenderness at the sites. Post biopsy  instructions and care were reviewed and questions were answered. The patient was encouraged to call [facility] for any additional concerns. The patient was referred to [facility] at [facility] on 11/17/2022, per patient request. Pathology results reported by Stacie Acres RN on 11/10/2022. Electronically Signed   By: Lillia Mountain M.D.   On: 11/10/2022 09:49   Result Date: 11/10/2022 CLINICAL DATA:  Indeterminate masses in the 8 o'clock region of the left breast 1 cm from the nipple, 8 o'clock region of the left breast 5 cm from the nipple and a left axillary lymph node. EXAM: ULTRASOUND GUIDED LEFT BREAST CORE NEEDLE BIOPSIES COMPARISON:  Previous exam(s). PROCEDURE: I met with the patient and we discussed the procedure of ultrasound-guided biopsy, including benefits and alternatives. We discussed the high likelihood of a successful procedure. We discussed the risks of the procedure, including infection, bleeding, tissue injury, clip migration, and inadequate sampling. Informed written consent was given. The usual time-out protocol was performed immediately prior to the procedure. Lesion quadrant: 8 O'CLOCK 1 CM FROM THE NIPPLE Using sterile technique and 1% Lidocaine as local anesthetic, under direct ultrasound visualization, a 14 gauge spring-loaded device was used to perform biopsy of a mass in the 8 o'clock region of the left breast 1 cm from the nipple using a lateral to medial approach. At the conclusion of the procedure ribbon shaped tissue marker clip was deployed into the biopsy cavity. Follow up 2 view mammogram was performed and dictated separately. Lesion quadrant: 8 O'CLOCK 5 CM FROM THE NIPPLE Using sterile technique and 1% Lidocaine as local anesthetic, under direct ultrasound visualization, a 14 gauge spring-loaded device was used to perform biopsy of a mass in the 8 o'clock region of the left breast 5 cm from the nipple using a lateral to medial approach. At the conclusion of the procedure heart shaped tissue marker clip was deployed into the biopsy cavity. Follow up 2 view mammogram was performed and dictated separately. Lesion quadrant: LEFT AXILLA Using sterile technique and 1%  Lidocaine as local anesthetic, under direct ultrasound visualization, a 14 gauge spring-loaded device was used to perform biopsy of a left axillary lymph node using a lateral to medial approach. At the conclusion of the procedure spiral shaped HydroMARK clip shaped tissue marker clip was deployed into the biopsy cavity. Follow up 2 view mammogram was performed and dictated separately. IMPRESSION: Ultrasound guided biopsies of the left breast and left axilla. No apparent complications. Electronically Signed: By: Lillia Mountain M.D. On: 11/08/2022 13:59  MM CLIP PLACEMENT LEFT  Result Date: 11/08/2022 CLINICAL DATA:  Status post ultrasound-guided core biopsies of 2 masses in the left breast and a left axillary lymph node EXAM: 3D DIAGNOSTIC LEFT MAMMOGRAM POST ULTRASOUND BIOPSIES COMPARISON:  Previous exam(s). FINDINGS: 3D Mammographic images were obtained following ultrasound-guided core biopsies of mass in the 8 o'clock region of the left breast 1 cm from the nipple (ribbon clip), a mass in the 8 o'clock region of the left breast 5 cm from the nipple (heart shaped clip) and a left axillary lymph node (spiral shaped HydroMARK clip). The clips are all located in appropriate position. IMPRESSION: Appropriate positioning of the ribbon shaped clip in the lower-inner quadrant of the left breast, heart shaped clip in the lower-inner quadrant of the left breast and spiral shaped HydroMARK clip in the left axilla. Final Assessment: Post Procedure Mammograms for Marker Placement Electronically Signed   By: Mordecai Rasmussen  Arceo M.D.   On: 11/08/2022 14:14  MM DIAG BREAST TOMO BILATERAL  Result Date: 10/25/2022 CLINICAL DATA:  67 year old female presenting for follow-up of 2 likely benign left breast masses. Of note, the patient has rheumatoid arthritis. EXAM: DIGITAL DIAGNOSTIC BILATERAL MAMMOGRAM WITH TOMOSYNTHESIS; ULTRASOUND LEFT BREAST LIMITED TECHNIQUE: Bilateral digital diagnostic mammography and breast tomosynthesis was  performed.; Targeted ultrasound examination of the left breast was performed. COMPARISON:  Previous exam(s). ACR Breast Density Category b: There are scattered areas of fibroglandular density. FINDINGS: The mass in the lower-inner quadrant of the left breast, likely corresponding with the mass on ultrasound at 8 o'clock appears slightly more prominent with possible associated distortion. The mass further posterior in the medial left breast is not clearly visualized on today's exam. No new suspicious calcifications, masses or areas of distortion are seen in the bilateral breasts. Ultrasound targeted to the left breast at 8 o'clock, 1 cm from the nipple demonstrates an irregular hypoechoic mass measuring 1.1 x 0.7 x 1.0 cm, previously measuring 0.8 x 0.5 x 0.7 mm in March of 2023. Ultrasound targeted to the left breast at 8 o'clock, 5 cm from the nipple demonstrates a slightly larger hypoechoic oval mass measuring 0.7 x 0.4 x 0.6 cm, previously 0.5 x 0.3 x 0.4 cm. Ultrasound of the left axilla demonstrates several mildly prominent left axillary lymph nodes with cortices measuring up to 5 mm. IMPRESSION: 1. Mild interval increase in size of the 2 masses in the left breast at 8 o'clock. 2.  Multiple prominent left axillary lymph nodes. 3.  No evidence of right breast malignancy. RECOMMENDATION: Ultrasound guided biopsy is recommended for the 2 masses in the left breast and the largest of the left axillary lymph nodes. I have discussed the findings and recommendations with the patient. If applicable, a reminder letter will be sent to the patient regarding the next appointment. BI-RADS CATEGORY  4: Suspicious. Electronically Signed   By: Ammie Ferrier M.D.   On: 10/25/2022 12:22  US BREAST LTD UNI LEFT INC AXILLA  Result Date: 10/25/2022 CLINICAL DATA:  67 year old female presenting for follow-up of 2 likely benign left breast masses. Of note, the patient has rheumatoid arthritis. EXAM: DIGITAL DIAGNOSTIC  BILATERAL MAMMOGRAM WITH TOMOSYNTHESIS; ULTRASOUND LEFT BREAST LIMITED TECHNIQUE: Bilateral digital diagnostic mammography and breast tomosynthesis was performed.; Targeted ultrasound examination of the left breast was performed. COMPARISON:  Previous exam(s). ACR Breast Density Category b: There are scattered areas of fibroglandular density. FINDINGS: The mass in the lower-inner quadrant of the left breast, likely corresponding with the mass on ultrasound at 8 o'clock appears slightly more prominent with possible associated distortion. The mass further posterior in the medial left breast is not clearly visualized on today's exam. No new suspicious calcifications, masses or areas of distortion are seen in the bilateral breasts. Ultrasound targeted to the left breast at 8 o'clock, 1 cm from the nipple demonstrates an irregular hypoechoic mass measuring 1.1 x 0.7 x 1.0 cm, previously measuring 0.8 x 0.5 x 0.7 mm in March of 2023. Ultrasound targeted to the left breast at 8 o'clock, 5 cm from the nipple demonstrates a slightly larger hypoechoic oval mass measuring 0.7 x 0.4 x 0.6 cm, previously 0.5 x 0.3 x 0.4 cm. Ultrasound of the left axilla demonstrates several mildly prominent left axillary lymph nodes with cortices measuring up to 5 mm. IMPRESSION: 1. Mild interval increase in size of the 2 masses in the left breast at 8 o'clock. 2.  Multiple prominent left axillary lymph nodes.  3.  No evidence of right breast malignancy. RECOMMENDATION: Ultrasound guided biopsy is recommended for the 2 masses in the left breast and the largest of the left axillary lymph nodes. I have discussed the findings and recommendations with the patient. If applicable, a reminder letter will be sent to the patient regarding the next appointment. BI-RADS CATEGORY  4: Suspicious. Electronically Signed   By: Ammie Ferrier M.D.   On: 10/25/2022 12:22      IMPRESSION/PLAN: 1. Stage IA, [id number], grade 1, ER/PR positive invasive ductal  carcinoma of the left breast. Dr. Lisbeth Renshaw discusses the pathology findings and reviews the nature of left breast disease. The consensus from the breast conference includes breast conservation with lumpectomy with *** sentinel node biopsy. Depending on the size of the final tumor measurements rendered by pathology, the tumor may be tested for Oncotype Dx score to determine a role for systemic therapy. Provided that chemotherapy is not indicated, the patient's course would then be followed by external radiotherapy to the breast  to reduce risks of local recurrence followed by antiestrogen therapy. We discussed the risks, benefits, short, and long term effects of radiotherapy, as well as the curative intent, and the patient is interested in proceeding. Dr. Lisbeth Renshaw discusses the delivery and logistics of radiotherapy and anticipates a course of 4 or up to 6 1/2 weeks of radiotherapy to the left breast with deep inspiration breath-hold technique. We will see her back a few weeks after surgery to discuss the simulation process and anticipate we starting radiotherapy about 4-6 weeks after surgery.  2. Possible genetic predisposition to malignancy. The patient is a candidate for genetic testing given her personal and family history. She will meet with our geneticist today in clinic. 3. Rheumatoid Arthritis.  Patient is followed by Dr. Estanislado Pandy, and remains on Plaquenil.  This should not interfere with plans for upcoming radiation.  In a visit lasting *** minutes, greater than 50% of the time was spent face to face reviewing her case, as well as in preparation of, discussing, and coordinating the patient's care.  The above documentation reflects my direct findings during this shared patient visit. Please see the separate note by Dr. Lisbeth Renshaw on this date for the remainder of the patient's plan of care.   ***  Maureen Chatters, [facility]     **Disclaimer: This note was dictated with voice recognition software. Similar  sounding words can inadvertently be transcribed and this note may contain transcription errors which may not have been corrected upon publication of note.**

## 2022-11-16 ENCOUNTER — Encounter: Payer: Self-pay | Admitting: Genetic Counselor

## 2022-11-16 ENCOUNTER — Encounter: Payer: Self-pay | Admitting: *Deleted

## 2022-11-16 DIAGNOSIS — Z170 Estrogen receptor positive status [ER+]: Secondary | ICD-10-CM

## 2022-11-16 DIAGNOSIS — C50312 Malignant neoplasm of lower-inner quadrant of left female breast: Secondary | ICD-10-CM

## 2022-11-17 ENCOUNTER — Inpatient Hospital Stay (HOSPITAL_BASED_OUTPATIENT_CLINIC_OR_DEPARTMENT_OTHER): Payer: Medicare Other | Admitting: Genetic Counselor

## 2022-11-17 ENCOUNTER — Ambulatory Visit: Payer: Self-pay | Admitting: Surgery

## 2022-11-17 ENCOUNTER — Encounter: Payer: Self-pay | Admitting: *Deleted

## 2022-11-17 ENCOUNTER — Ambulatory Visit
Admission: RE | Admit: 2022-11-17 | Discharge: 2022-11-17 | Disposition: A | Discharge status: Home / Self Care | Payer: Medicare Other | Source: Ambulatory Visit | Attending: Radiation Oncology | Admitting: Radiation Oncology

## 2022-11-17 ENCOUNTER — Encounter: Payer: Self-pay | Admitting: Physical Therapy

## 2022-11-17 ENCOUNTER — Ambulatory Visit: Payer: Medicare Other | Attending: Surgery | Admitting: Physical Therapy

## 2022-11-17 ENCOUNTER — Encounter: Payer: Self-pay | Admitting: Radiology

## 2022-11-17 ENCOUNTER — Other Ambulatory Visit: Payer: Self-pay

## 2022-11-17 ENCOUNTER — Inpatient Hospital Stay: Payer: Medicare Other

## 2022-11-17 ENCOUNTER — Inpatient Hospital Stay: Payer: Medicare Other | Attending: Hematology and Oncology | Admitting: Hematology and Oncology

## 2022-11-17 ENCOUNTER — Encounter: Payer: Self-pay | Admitting: General Practice

## 2022-11-17 VITALS — BP 156/67 | HR 76 | Temp 97.2°F | Resp 18 | Ht 69.0 in | Wt 281.4 lb

## 2022-11-17 DIAGNOSIS — Z800 Family history of malignant neoplasm of digestive organs: Secondary | ICD-10-CM

## 2022-11-17 DIAGNOSIS — Z170 Estrogen receptor positive status [ER+]: Secondary | ICD-10-CM

## 2022-11-17 DIAGNOSIS — R293 Abnormal posture: Secondary | ICD-10-CM | POA: Insufficient documentation

## 2022-11-17 DIAGNOSIS — C50912 Malignant neoplasm of unspecified site of left female breast: Secondary | ICD-10-CM

## 2022-11-17 DIAGNOSIS — C50312 Malignant neoplasm of lower-inner quadrant of left female breast: Secondary | ICD-10-CM

## 2022-11-17 DIAGNOSIS — Z8042 Family history of malignant neoplasm of prostate: Secondary | ICD-10-CM

## 2022-11-17 DIAGNOSIS — C50512 Malignant neoplasm of lower-outer quadrant of left female breast: Secondary | ICD-10-CM

## 2022-11-17 DIAGNOSIS — Z87891 Personal history of nicotine dependence: Secondary | ICD-10-CM | POA: Insufficient documentation

## 2022-11-17 LAB — CMP (CANCER CENTER ONLY)
ALT: 11 U/L (ref 0–44)
AST: 16 U/L (ref 15–41)
Albumin: 4 g/dL (ref 3.5–5.0)
Alkaline Phosphatase: 81 U/L (ref 38–126)
Anion gap: 5 (ref 5–15)
BUN: 31 mg/dL — ABNORMAL HIGH (ref 8–23)
CO2: 28 mmol/L (ref 22–32)
Calcium: 9.3 mg/dL (ref 8.9–10.3)
Chloride: 104 mmol/L (ref 98–111)
Creatinine: 1.28 mg/dL — ABNORMAL HIGH (ref 0.44–1.00)
GFR, Estimated: 46 mL/min — ABNORMAL LOW (ref >60)
Glucose, Bld: 99 mg/dL (ref 70–99)
Potassium: 4.9 mmol/L (ref 3.5–5.1)
Sodium: 137 mmol/L (ref 135–145)
Total Bilirubin: 0.4 mg/dL (ref 0.3–1.2)
Total Protein: 7.5 g/dL (ref 6.5–8.1)

## 2022-11-17 LAB — CBC WITH DIFFERENTIAL (CANCER CENTER ONLY)
Abs Immature Granulocytes: 0.03 10*3/uL (ref 0.00–0.07)
Basophils Absolute: 0 10*3/uL (ref 0.0–0.1)
Basophils Relative: 1 %
Eosinophils Absolute: 0.2 10*3/uL (ref 0.0–0.5)
Eosinophils Relative: 2 %
HCT: 33.2 % — ABNORMAL LOW (ref 36.0–46.0)
Hemoglobin: 11.1 g/dL — ABNORMAL LOW (ref 12.0–15.0)
Immature Granulocytes: 0 %
Lymphocytes Relative: 21 %
Lymphs Abs: 1.4 10*3/uL (ref 0.7–4.0)
MCH: 27.5 pg (ref 26.0–34.0)
MCHC: 33.4 g/dL (ref 30.0–36.0)
MCV: 82.4 fL (ref 80.0–100.0)
Monocytes Absolute: 0.6 10*3/uL (ref 0.1–1.0)
Monocytes Relative: 9 %
Neutro Abs: 4.5 10*3/uL (ref 1.7–7.7)
Neutrophils Relative %: 67 %
Platelet Count: 275 10*3/uL (ref 150–400)
RBC: 4.03 MIL/uL (ref 3.87–5.11)
RDW: 14.6 % (ref 11.5–15.5)
WBC Count: 6.7 10*3/uL (ref 4.0–10.5)
nRBC: 0 % (ref 0.0–0.2)

## 2022-11-17 LAB — GENETIC SCREENING ORDER

## 2022-11-17 NOTE — Therapy (Signed)
OUTPATIENT PHYSICAL THERAPY BREAST CANCER BASELINE EVALUATION   Patient Name: Pamela Davenport MRN: [id number] DOB:[birth year], 67 y.o., female Today's Date: 11/17/2022  END OF SESSION:  PT End of Session - 11/17/22 1322     Visit Number 1    Number of Visits 2    Date for PT Re-Evaluation 01/12/23    PT Start Time [id number]    PT Stop Time 1449    PT Time Calculation (min) 25 min    Activity Tolerance Patient tolerated treatment well    Behavior During Therapy [facility] for tasks assessed/performed             Past Medical History:  Diagnosis Date   Atypical chest pain    normal myoview 11/11/10   Blood transfusion without reported diagnosis    DVT (deep venous thrombosis) (HCC)    recurrent; chronically anticoagulated with coumadin   Family history of anesthesia complication    Sister had decrease in respirations after surgery   Heart murmur    Herniated lumbar intervertebral disc    Hypertension    Paroxysmal atrial fibrillation ([place])    SINCE AGE 75    PE (pulmonary embolism)    chronically anticoagulated with coumadin   Pneumonia 07/20/12   PONV (postoperative nausea and vomiting)    Rheumatoid arthritis (HCC)    Sickle cell trait (HCC)    Sinus congestion 08/14/2018   C/O NASAL/CHEST  CONGESTION, COUGHING WITH PHLEGM, PER PATIENT , SHE IS TO BEGIN TAKING AUGMENTIN DOSE PACK TODAY;     Strep throat at age 35   The patient reports a severe strep throat infection at age 26 and   is not clear as to whether or not she may have had rheumatic fever.   Symptomatic premature ventricular contractions    improved s/p ablation   Urgency of urination    Uterine bleeding 2008   Uterine artery embolization in 2008 for uterine bleeding.    Past Surgical History:  Procedure Laterality Date   BREAST BIOPSY Left 11/08/2022   [place] LT BREAST BX W LOC DEV EA ADD LESION IMG BX SPEC US GUIDE 11/08/2022 [facility]   BREAST BIOPSY Left 11/08/2022   [place] LT BREAST BX W LOC DEV 1ST LESION  IMG BX SPEC US GUIDE 11/08/2022 [facility]   COLONOSCOPY W/ POLYPECTOMY     Diagnostic D&C hysteroscopy and Novasure ablation  03/17/2004   DILATION AND CURETTAGE OF UTERUS  12/17/2011   Procedure: DILATATION AND CURETTAGE;  Surgeon: Frederico Hamman, MD;  Location: [facility];  Service: Gynecology;  Laterality: N/A;  With Attempted Hydrothermal Ablation   LUMBAR LAMINECTOMY/DECOMPRESSION MICRODISCECTOMY Left 02/21/2013   Procedure: LUMBAR LAMINECTOMY/DECOMPRESSION MICRODISCECTOMY 1 LEVEL;  Surgeon: Winfield Cunas, MD;  Location: [facility];  Service: Neurosurgery;  Laterality: Left;  LEFT Lumbar Three-Four Laminotomy foraminotomy microdiskectomy   MANDIBLE SURGERY     under bite   PVC Ablation  2010   ROBOTIC ASSISTED TOTAL HYSTERECTOMY WITH BILATERAL SALPINGO OOPHERECTOMY Bilateral 08/21/2018   Procedure: XI ROBOTIC ASSISTED TOTAL HYSTERECTOMY WITH BILATERAL SALPINGO OOPHORECTOMY;  Surgeon: Everitt Amber, MD;  Location: [facility];  Service: Gynecology;  Laterality: Bilateral;   SPINE SURGERY     TONSILLECTOMY     UTERINE ARTERY EMBOLIZATION  2008   for uterine bleeding   VASCULAR SURGERY     laser of left leg   Patient Active Problem List   Diagnosis Date Noted   Malignant neoplasm of lower-inner quadrant of left breast in  female, estrogen receptor positive ([place]) 11/15/2022   Anemia due to acquired thiamine deficiency 05/07/2022   Deficiency anemia 05/04/2022   Gastroesophageal reflux disease with esophagitis without hemorrhage 05/04/2022   Esophageal dysphagia 05/04/2022   Intermittent palpitations 05/04/2022   OAB (overactive bladder) 12/31/2021   Encounter for general adult medical examination with abnormal findings 08/26/2021   Obesity, morbid, BMI 40.0-49.9 ([place]) 08/26/2021   PAF (paroxysmal atrial fibrillation) ([place]) 08/26/2021   GERD without esophagitis 10/04/2019   Vitamin D deficiency disease 09/25/2019   Hypercoagulable state ([place]) 09/24/2019   Iron  deficiency anemia 12/07/2018   Other dietary vitamin B12 deficiency anemia 12/07/2018   Visit for screening mammogram 04/18/2017   Hyperlipidemia with target LDL less than 130 10/19/2016   Essential hypertension 01/13/2015   Chronic pulmonary embolism ([place]) 12/05/2008    REFERRING PROVIDER: Dr. Erroll Luna  REFERRING DIAG: Left breast cancer  THERAPY DIAG:  Malignant neoplasm of lower-inner quadrant of left breast in female, estrogen receptor positive ([place])  Abnormal posture  Rationale for Evaluation and Treatment: Rehabilitation  ONSET DATE: 11/09/2022  SUBJECTIVE:                                                                                                                                                                                           SUBJECTIVE STATEMENT: Patient reports she is here today to be seen by her medical team for her newly diagnosed left breast cancer.   PERTINENT HISTORY:  Patient was diagnosed on 11/09/2022 with left grade 1 invasive ductal carcinoma breast cancer. It measures 1.1 cm and 7 mm and is located in the lower inner quadrant. It is ER/PR positive and HER2 negative with a Ki67 of 10%.   PATIENT GOALS:   reduce lymphedema risk and learn post op HEP.   PAIN:  Are you having pain? No  PRECAUTIONS: Active CA   HAND DOMINANCE: right  WEIGHT BEARING RESTRICTIONS: No  FALLS:  Has patient fallen in last 6 months? No  LIVING ENVIRONMENT: Patient lives with: her brother Lives in: House/apartment Has following equipment at home: None  OCCUPATION: Retired Therapist, sports  LEISURE: She walks 1-2x/week for 15-20 min  PRIOR LEVEL OF FUNCTION: Independent   OBJECTIVE:  COGNITION: Overall cognitive status: Within functional limits for tasks assessed    POSTURE:  Forward head and rounded shoulders posture  UPPER EXTREMITY AROM/PROM:  A/PROM RIGHT   eval   Shoulder extension 56  Shoulder flexion 147  Shoulder abduction 146  Shoulder internal  rotation 80  Shoulder external rotation 81    (Blank rows = not tested)  A/PROM LEFT   eval  Shoulder extension 72  Shoulder flexion 150  Shoulder abduction 160  Shoulder internal rotation 73  Shoulder external rotation 80    (Blank rows = not tested)  CERVICAL AROM: All within normal limits  UPPER EXTREMITY STRENGTH: WFL  LYMPHEDEMA ASSESSMENTS:   LANDMARK RIGHT   eval  10 cm proximal to olecranon process 42.8  Olecranon process 30.3  10 cm proximal to ulnar styloid process 25  Just proximal to ulnar styloid process 16.3  Across hand at thumb web space 19  At base of 2nd digit 6.7  (Blank rows = not tested)  LANDMARK LEFT   eval  10 cm proximal to olecranon process 40.4  Olecranon process 29.9  10 cm proximal to ulnar styloid process 23.3  Just proximal to ulnar styloid process 15.9  Across hand at thumb web space 18.7  At base of 2nd digit 6.3  (Blank rows = not tested)  L-DEX LYMPHEDEMA SCREENING:  The patient was assessed using the L-Dex machine today to produce a lymphedema index baseline score. The patient will be reassessed on a regular basis (typically every 3 months) to obtain new L-Dex scores. If the score is > 6.5 points away from his/her baseline score indicating onset of subclinical lymphedema, it will be recommended to wear a compression garment for 4 weeks, 12 hours per day and then be reassessed. If the score continues to be > 6.5 points from baseline at reassessment, we will initiate lymphedema treatment. Assessing in this manner has a 95% rate of preventing clinically significant lymphedema.   L-DEX FLOWSHEETS - 11/17/22 1300       L-DEX LYMPHEDEMA SCREENING   Measurement Type Unilateral    L-DEX MEASUREMENT EXTREMITY Upper Extremity    POSITION  Standing    DOMINANT SIDE Right    At Risk Side Left    BASELINE SCORE (UNILATERAL) 1.3             PATIENT EDUCATION:  Education details: Lymphedema risk reduction and post op shoulder/posture  HEP Person educated: Patient Education method: Explanation, Demonstration, Handout Education comprehension: Patient verbalized understanding and returned demonstration  HOME EXERCISE PROGRAM: Patient was instructed today in a home exercise program today for post op shoulder range of motion. These included active assist shoulder flexion in sitting, scapular retraction, wall walking with shoulder abduction, and hands behind head external rotation.  She was encouraged to do these twice a day, holding 3 seconds and repeating 5 times when permitted by her physician.   ASSESSMENT:  CLINICAL IMPRESSION: Patient was diagnosed on 11/09/2022 with left grade 1 invasive ductal carcinoma breast cancer. It measures 1.1 cm and 7 mm and is located in the lower inner quadrant. It is ER/PR positive and HER2 negative with a Ki67 of 10%. Her multidisciplinary medical team met prior to her assessments to determine a recommended treatment plan. She is planning to have a left lumpectomy and sentinel node biopsy followed by Oncotype testing, radiation, and anti-estrogen therapy. She will benefit from a post op PT reassessment to determine needs and from L-Dex screens every 3 months for 2 years to detect subclinical lymphedema.  Pt will benefit from skilled therapeutic intervention to improve on the following deficits: Decreased knowledge of precautions, impaired UE functional use, pain, decreased ROM, postural dysfunction.   PT treatment/interventions: ADL/self-care home management, pt/family education, therapeutic exercise  REHAB POTENTIAL: Excellent  CLINICAL DECISION MAKING: Stable/uncomplicated  EVALUATION COMPLEXITY: Low   GOALS: Goals reviewed with patient? YES  LONG TERM GOALS: (STG=LTG)    Name Target Date Goal  status  1 Pt will be able to verbalize understanding of pertinent lymphedema risk reduction practices relevant to her dx specifically related to skin care.  Baseline:  No knowledge 11/17/2022  Achieved at eval  2 Pt will be able to return demo and/or verbalize understanding of the post op HEP related to regaining shoulder ROM. Baseline:  No knowledge 11/17/2022 Achieved at eval  3 Pt will be able to verbalize understanding of the importance of attending the post op After Breast CA Class for further lymphedema risk reduction education and therapeutic exercise.  Baseline:  No knowledge 11/17/2022 Achieved at eval  4 Pt will demo she has regained full shoulder ROM and function post operatively compared to baselines.  Baseline: See objective measurements taken today. 01/12/2023     PLAN:  PT FREQUENCY/DURATION: EVAL and 1 follow up appointment.   PLAN FOR NEXT SESSION: will reassess 3-4 weeks post op to determine needs.   Patient will follow up at outpatient cancer rehab 3-4 weeks following surgery.  If the patient requires physical therapy at that time, a specific plan will be dictated and sent to the referring physician for approval. The patient was educated today on appropriate basic range of motion exercises to begin post operatively and the importance of attending the After Breast Cancer class following surgery.  Patient was educated today on lymphedema risk reduction practices as it pertains to recommendations that will benefit the patient immediately following surgery.  She verbalized good understanding.    Physical Therapy Information for After Breast Cancer Surgery/Treatment:  Lymphedema is a swelling condition that you may be at risk for in your arm if you have lymph nodes removed from the armpit area.  After a sentinel node biopsy, the risk is approximately 5-9% and is higher after an axillary node dissection.  There is treatment available for this condition and it is not life-threatening.  Contact your physician or physical therapist with concerns. You may begin the 4 shoulder/posture exercises (see additional sheet) when permitted by your physician (typically a week after  surgery).  If you have drains, you may need to wait until those are removed before beginning range of motion exercises.  A general recommendation is to not lift your arms above shoulder height until drains are removed.  These exercises should be done to your tolerance and gently.  This is not a "no pain/no gain" type of recovery so listen to your body and stretch into the range of motion that you can tolerate, stopping if you have pain.  If you are having immediate reconstruction, ask your plastic surgeon about doing exercises as he or she may want you to wait. We encourage you to attend the free one time ABC (After Breast Cancer) class offered by [facility].  You will learn information related to lymphedema risk, prevention and treatment and additional exercises to regain mobility following surgery.  You can call [phone] for more information.  This is offered the 1st and 3rd Monday of each month.  You only attend the class one time. While undergoing any medical procedure or treatment, try to avoid blood pressure being taken or needle sticks from occurring on the arm on the side of cancer.   This recommendation begins after surgery and continues for the rest of your life.  This may help reduce your risk of getting lymphedema (swelling in your arm). An excellent resource for those seeking information on lymphedema is the National Lymphedema Network's web site. It can be accessed  at [employer] If you notice swelling in your hand, arm or breast at any time following surgery (even if it is many years from now), please contact your doctor or physical therapist to discuss this.  Lymphedema can be treated at any time but it is easier for you if it is treated early on.  If you feel like your shoulder motion is not returning to normal in a reasonable amount of time, please contact your surgeon or physical therapist.  [facility] [phone]. [address], [place] [address]  ABC CLASS After Breast Cancer Class  After Breast Cancer Class is a specially designed exercise class to assist you in a safe recover after having breast cancer surgery.  In this class you will learn how to get back to full function whether your drains were just removed or if you had surgery a month ago.  This one-time class is held the 1st and 3rd Monday of every month from 11:00 a.m. until 12:00 noon virtually.  This class is FREE and space is limited. For more information or to register for the next available class, call [phone].  Class Goals  Understand specific stretches to improve the flexibility of you chest and shoulder. Learn ways to safely strengthen your upper body and improve your posture. Understand the warning signs of infection and why you may be at risk for an arm infection. Learn about Lymphedema and prevention.  ** You do not attend this class until after surgery.  Drains must be removed to participate  Patient was instructed today in a home exercise program today for post op shoulder range of motion. These included active assist shoulder flexion in sitting, scapular retraction, wall walking with shoulder abduction, and hands behind head external rotation.  She was encouraged to do these twice a day, holding 3 seconds and repeating 5 times when permitted by her physician.  Annia Friendly, [place] 11/17/22 3:50 PM

## 2022-11-17 NOTE — Assessment & Plan Note (Signed)
11/15/2022:Screening mammogram detected left breast masses at 8 o'clock position: 1.1 cm: Grade 1 IDC ER 95%, PR 80%, HER2 negative 0.7 cm mass: Grade 1 IDC ER 100%, PR 90%, HER2 negative, Ki-67 10%, multiple lymph nodes 1 was biopsied and benign discordant  Pathology and radiology counseling:Discussed with the patient, the details of pathology including the type of breast cancer,the clinical staging, the significance of ER, PR and HER-2/neu receptors and the implications for treatment. After reviewing the pathology in detail, we proceeded to discuss the different treatment options between surgery, radiation, chemotherapy, antiestrogen therapies.  Recommendations: 1. Breast conserving surgery followed by 2. Oncotype DX testing to determine if chemotherapy would be of any benefit followed by 3. Adjuvant radiation therapy followed by 4. Adjuvant antiestrogen therapy  Oncotype counseling: I discussed Oncotype DX test. I explained to the patient that this is a 21 gene panel to evaluate patient tumors DNA to calculate recurrence score. This would help determine whether patient has high risk or low risk breast cancer. She understands that if her tumor was found to be high risk, she would benefit from systemic chemotherapy. If low risk, no need of chemotherapy.  Return to clinic after surgery to discuss final pathology report and then determine if Oncotype DX testing will need to be sent.

## 2022-11-17 NOTE — Progress Notes (Signed)
[facility] NOTE  Patient Care Team: Janith Lima, MD as PCP - General (Internal Medicine) Thompson Grayer, MD (Inactive) as PCP - Electrophysiology (Cardiology) Thompson Grayer, MD (Inactive) as PCP - Cardiology (Cardiology) Thompson Grayer, MD (Inactive) as Attending Physician (Cardiology) Stark Klein, MD as Consulting Physician (General Surgery) Nicholas Lose, MD as Consulting Physician (Hematology and Oncology) Kyung Rudd, MD as Consulting Physician (Radiation Oncology) Mauro Kaufmann, RN as Oncology Nurse Navigator Rockwell Germany, RN as Oncology Nurse Navigator  CHIEF COMPLAINTS/PURPOSE OF CONSULTATION:  Newly diagnosed breast cancer  HISTORY OF PRESENTING ILLNESS:  Pamela Davenport 66 y.o. female is here because of recent diagnosis of left breast cancer.  She had a routine screening mammogram detected left breast mass measuring 1.1 cm and 0.7 cm respectively.  Both of which came back as grade 1 IDC that was ER/PR positive HER2 negative with a Ki-67 10%.  She was presented this morning to the multidisciplinary tumor board and she is here today to discuss her treatment plan.  I reviewed her records extensively and collaborated the history with the patient.  SUMMARY OF ONCOLOGIC HISTORY: Oncology History  Malignant neoplasm of lower-inner quadrant of left breast in female, estrogen receptor positive ([place])  11/08/2022 Surgery   Screening mammogram detected left breast masses at 8 o'clock position: 1.1 cm: Grade 1 IDC ER 95%, PR 80%, HER2 negative 0.7 cm mass: Grade 1 IDC ER 100%, PR 90%, HER2 negative, Ki-67 10%, multiple lymph nodes 1 was biopsied and benign discordant   11/15/2022 Initial Diagnosis   Primary malignant neoplasm of lower outer quadrant of female breast, left ([place])   11/15/2022 Cancer Staging   Staging form: Breast, AJCC 8th Edition - Clinical stage from 11/15/2022: Stage IA (cT1c, cN0(f), cM0, G1, ER+, PR+, HER2-) - Signed by Hayden Pedro, PA-C on 11/15/2022 Stage prefix: Initial diagnosis Method of lymph node assessment: Core biopsy Histologic grading system: 3 grade system      MEDICAL HISTORY:  Past Medical History:  Diagnosis Date   Atypical chest pain    normal myoview 11/11/10   Blood transfusion without reported diagnosis    DVT (deep venous thrombosis) (HCC)    recurrent; chronically anticoagulated with coumadin   Family history of anesthesia complication    Sister had decrease in respirations after surgery   Heart murmur    Herniated lumbar intervertebral disc    Hypertension    Paroxysmal atrial fibrillation ([place])    SINCE AGE 62    PE (pulmonary embolism)    chronically anticoagulated with coumadin   Pneumonia 07/20/12   PONV (postoperative nausea and vomiting)    Rheumatoid arthritis (HCC)    Sickle cell trait (HCC)    Sinus congestion 08/14/2018   C/O NASAL/CHEST  CONGESTION, COUGHING WITH PHLEGM, PER PATIENT , SHE IS TO BEGIN TAKING AUGMENTIN DOSE PACK TODAY;     Strep throat at age 23   The patient reports a severe strep throat infection at age 83 and   is not clear as to whether or not she may have had rheumatic fever.   Symptomatic premature ventricular contractions    improved s/p ablation   Urgency of urination    Uterine bleeding 2008   Uterine artery embolization in 2008 for uterine bleeding.     SURGICAL HISTORY: Past Surgical History:  Procedure Laterality Date   BREAST BIOPSY Left 11/08/2022   [place] LT BREAST BX W LOC DEV EA ADD LESION IMG BX SPEC US GUIDE  11/08/2022 [facility]   BREAST BIOPSY Left 11/08/2022   [place] LT BREAST BX W LOC DEV 1ST LESION IMG BX SPEC US GUIDE 11/08/2022 [facility]   COLONOSCOPY W/ POLYPECTOMY     Diagnostic D&C hysteroscopy and Novasure ablation  03/17/2004   DILATION AND CURETTAGE OF UTERUS  12/17/2011   Procedure: DILATATION AND CURETTAGE;  Surgeon: Frederico Hamman, MD;  Location: [facility];  Service: Gynecology;  Laterality: N/A;  With  Attempted Hydrothermal Ablation   LUMBAR LAMINECTOMY/DECOMPRESSION MICRODISCECTOMY Left 02/21/2013   Procedure: LUMBAR LAMINECTOMY/DECOMPRESSION MICRODISCECTOMY 1 LEVEL;  Surgeon: Winfield Cunas, MD;  Location: [facility];  Service: Neurosurgery;  Laterality: Left;  LEFT Lumbar Three-Four Laminotomy foraminotomy microdiskectomy   MANDIBLE SURGERY     under bite   PVC Ablation  2010   ROBOTIC ASSISTED TOTAL HYSTERECTOMY WITH BILATERAL SALPINGO OOPHERECTOMY Bilateral 08/21/2018   Procedure: XI ROBOTIC ASSISTED TOTAL HYSTERECTOMY WITH BILATERAL SALPINGO OOPHORECTOMY;  Surgeon: Everitt Amber, MD;  Location: [facility];  Service: Gynecology;  Laterality: Bilateral;   SPINE SURGERY     TONSILLECTOMY     UTERINE ARTERY EMBOLIZATION  2008   for uterine bleeding   VASCULAR SURGERY     laser of left leg    SOCIAL HISTORY: Social History   Socioeconomic History   Marital status: Single    Spouse name: Not on file   Number of children: Not on file   Years of education: Not on file   Highest education level: Not on file  Occupational History   Occupation: Nurse  Tobacco Use   Smoking status: Former    Packs/day: 1.00    Years: 2.00    Additional pack years: 0.00    Total pack years: 2.00    Types: Cigarettes    Quit date: 02/13/1979    Years since quitting: 43.7    Passive exposure: Never   Smokeless tobacco: Never  Vaping Use   Vaping Use: Never used  Substance and Sexual Activity   Alcohol use: Yes    Comment: rarely   Drug use: No   Sexual activity: Not on file  Other Topics Concern   Not on file  Social History Narrative   Works as a Marine scientist at [employer] Strain: Not on file  Food Insecurity: Not on file  Transportation Needs: Not on file  Physical Activity: Not on file  Stress: Not on file  Social Connections: Not on file  Intimate Partner Violence: Not on file    FAMILY HISTORY: Family History   Problem Relation Age of Onset   Hypertension Mother    Diabetes Mother    Uterine cancer Sister    Prostate cancer Brother    Prostate cancer Brother    Prostate cancer Brother    Colon cancer Paternal Grandfather    Healthy Son    Non-Hodgkin's lymphoma Paternal Aunt    Pancreatic cancer Paternal Aunt    Breast cancer Neg Hx     ALLERGIES:  is allergic to methylprednisolone, sulfa antibiotics, sulfonamide derivatives, and macrobid [nitrofurantoin].  MEDICATIONS:  Current Outpatient Medications  Medication Sig Dispense Refill   clindamycin (CLEOCIN) 300 MG capsule Take 1 capsule (300 mg total) by mouth 3 (three) times daily. (Patient not taking: Reported on 11/04/2022) 21 capsule 2   clindamycin (CLINDAGEL) 1 % gel Apply 1 application topically every day (Patient not taking: Reported on 11/04/2022) 60 g 5   esomeprazole (NEXIUM) 40  MG capsule Take 1 capsule (40 mg total) by mouth daily. 90 capsule 1   flecainide (TAMBOCOR) 150 MG tablet Take 2 tablets by mouth at the onset of AFIB 30 tablet 3   hydroxychloroquine (PLAQUENIL) 200 MG tablet Take 1 tablet (200 mg total) by mouth 2 (two) times daily. 180 tablet 0   hydrOXYzine (ATARAX) 25 MG tablet Take 1 tablet (25 mg total) by mouth 3 (three) times daily as needed. 30 tablet 2   indapamide (LOZOL) 1.25 MG tablet Take 1 tablet (1.25 mg total) by mouth daily. 90 tablet 0   losartan (COZAAR) 100 MG tablet Take 1 tablet (100 mg total) by mouth daily. 90 tablet 3   nadolol (CORGARD) 20 MG tablet Take 1 tablet (20 mg total) by mouth daily. 90 tablet 3   nystatin-triamcinolone ointment (MYCOLOG) Apply to affected areas two times a day 20 g 2   Omega-3 Fatty Acids (OMEGA 3 PO) Take 1 tablet by mouth daily. (Patient not taking: Reported on 11/04/2022)     polyethylene glycol-electrolytes (NULYTELY) 420 g solution Use as directed for 2 day prep. (Patient not taking: Reported on 11/04/2022) 8000 mL 0   rivaroxaban (XARELTO) 20 MG TABS tablet Take 1  tablet (20 mg total) by mouth daily with supper. 90 tablet 3   TART CHERRY PO Take 2 tablets by mouth daily.     thiamine (VITAMIN B-1) 50 MG tablet Take 1 tablet (50 mg total) by mouth daily. (Patient not taking: Reported on 11/04/2022) 90 tablet 1   No current facility-administered medications for this visit.    REVIEW OF SYSTEMS:   Constitutional: Denies fevers, chills or abnormal night sweats Breast:  Denies any palpable lumps or discharge All other systems were reviewed with the patient and are negative.  PHYSICAL EXAMINATION: ECOG PERFORMANCE STATUS: 1 - Symptomatic but completely ambulatory  Vitals:   11/17/22 1226  BP: (!) 156/67  Pulse: 76  Resp: 18  Temp: (!) 97.2 F (36.2 C)  SpO2: 100%   Filed Weights   11/17/22 1226  Weight: 281 lb 6.4 oz (127.6 kg)    GENERAL:alert, no distress and comfortable    LABORATORY DATA:  I have reviewed the data as listed Lab Results  Component Value Date   WBC 6.7 11/17/2022   HGB 11.1 (L) 11/17/2022   HCT 33.2 (L) 11/17/2022   MCV 82.4 11/17/2022   PLT 275 11/17/2022   Lab Results  Component Value Date   NA 137 11/17/2022   K 4.9 11/17/2022   CL 104 11/17/2022   CO2 28 11/17/2022    RADIOGRAPHIC STUDIES: I have personally reviewed the radiological reports and agreed with the findings in the report.  ASSESSMENT AND PLAN:  Malignant neoplasm of lower-inner quadrant of left breast in female, estrogen receptor positive ([place]) 11/15/2022:Screening mammogram detected left breast masses at 8 o'clock position: 1.1 cm: Grade 1 IDC ER 95%, PR 80%, HER2 negative 0.7 cm mass: Grade 1 IDC ER 100%, PR 90%, HER2 negative, Ki-67 10%, multiple lymph nodes 1 was biopsied and benign discordant  Pathology and radiology counseling:Discussed with the patient, the details of pathology including the type of breast cancer,the clinical staging, the significance of ER, PR and HER-2/neu receptors and the implications for treatment. After reviewing  the pathology in detail, we proceeded to discuss the different treatment options between surgery, radiation, chemotherapy, antiestrogen therapies.  Recommendations: 1. Breast conserving surgery followed by 2. Oncotype DX testing to determine if chemotherapy would be of any benefit followed  by 3. Adjuvant radiation therapy followed by 4. Adjuvant antiestrogen therapy  Oncotype counseling: I discussed Oncotype DX test. I explained to the patient that this is a 21 gene panel to evaluate patient tumors DNA to calculate recurrence score. This would help determine whether patient has high risk or low risk breast cancer. She understands that if her tumor was found to be high risk, she would benefit from systemic chemotherapy. If low risk, no need of chemotherapy.  Return to clinic after surgery to discuss final pathology report and then determine if Oncotype DX testing will need to be sent.   All questions were answered. The patient knows to call the clinic with any problems, questions or concerns.    Harriette Ohara, MD 11/17/22

## 2022-11-17 NOTE — Research (Signed)
[employer] 2021-05 - Specimen Collection Study to Evaluate Biomarkers in Subjects with Cancer    11/17/2022  CONSENT INTRO:  Patient Pamela Davenport was identified by Dr. Lindi Adie as a potential candidate for the above listed study.  This Clinical Research Coordinator met with Pamela Davenport, [id number], on 11/17/22 in a manner and location that ensures patient privacy to discuss participation in the above listed research study.  Patient is Unaccompanied.  A copy of the informed consent document with embedded HIPAA language was provided to the patient.  Patient reads, speaks, and understands [place].   Patient was provided with the business card of this Coordinator and encouraged to contact the research team with any questions.  Approximately 10 minutes were spent with the patient reviewing the informed consent documents.  Patient was provided the option of taking informed consent documents home to review and was encouraged to review at their convenience with their support network, including other care providers. Patient took the consent documents home to review.  Patient expressed interest in participation. Will follow-up with patient next week to discuss study further. Thanked patient for her time and consideration of the above mentioned study.   Carol Ada, RT(R)(T) Clinical Research Coordinator

## 2022-11-17 NOTE — Progress Notes (Signed)
[place] Psychosocial Distress Screening Spiritual Care  Met with Pamela Davenport in [facility] to introduce [facility] team/resources, reviewing distress screen per protocol.  The patient scored a 7 on the Psychosocial Distress Thermometer which indicates severe distress. Also assessed for distress and other psychosocial needs.      11/17/2022    4:18 PM  ONCBCN DISTRESS SCREENING  Distress experienced in past week (1-10) 7  Emotional problem type Nervousness/Anxiety  Information Concerns Type Lack of info about diagnosis;Lack of info about treatment;Lack of info about complementary therapy choices  Physical Problem type Sleep/insomnia  Referral to support programs Yes    Chaplain and patient discussed common feelings and emotions when being diagnosed with cancer, and the importance of support during treatment.  Chaplain informed patient of the support team and support services at [facility].  Chaplain provided contact information and encouraged patient to call with any questions or concerns.  Pamela Davenport notes that attending [facility] helped reduce her distress to a 2. 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 She notes that her insomnia comes from working multiple shifts; she does anticipate sleeping better tonight with decreased distress.  Follow up needed: Yes.  We plan to follow up in ca two weeks for a check-in and to revisit possibility of an Pharmacist, hospital referral.   Suzette Battiest, [place], [facility] Pager [phone] Voicemail [phone]

## 2022-11-18 NOTE — Progress Notes (Signed)
REFERRING PROVIDER: Nicholas Lose, MD  PRIMARY PROVIDER:  Janith Lima, MD  PRIMARY REASON FOR VISIT:  Encounter Diagnoses  Name Primary?   Malignant neoplasm of lower-inner quadrant of left breast in female, estrogen receptor positive ([place]) Yes   Family history of prostate cancer    Family history of pancreatic cancer     HISTORY OF PRESENT ILLNESS:   Ms. Carda, a 67 y.o. female, was seen for a [place] cancer genetics consultation at the request of Dr. Lindi Adie due to a personal and family history of cancer.  Ms. Nicholl presents to clinic today to discuss the possibility of a hereditary predisposition to cancer, to discuss genetic testing, and to further clarify her future cancer risks, as well as potential cancer risks for family members.   In March 2024, at the age of 11, Ms. Guidroz was diagnosed with invasive ductal carcinoma of the left breast (ER/PR positive, HER2 negative).  CANCER HISTORY:  Oncology History  Malignant neoplasm of lower-inner quadrant of left breast in female, estrogen receptor positive ([place])  11/08/2022 Surgery   Screening mammogram detected left breast masses at 8 o'clock position: 1.1 cm: Grade 1 IDC ER 95%, PR 80%, HER2 negative 0.7 cm mass: Grade 1 IDC ER 100%, PR 90%, HER2 negative, Ki-67 10%, multiple lymph nodes 1 was biopsied and benign discordant   11/15/2022 Initial Diagnosis   Primary malignant neoplasm of lower outer quadrant of female breast, left ([place])   11/15/2022 Cancer Staging   Staging form: Breast, AJCC 8th Edition - Clinical stage from 11/15/2022: Stage IA (cT1c, cN0(f), cM0, G1, ER+, PR+, HER2-) - Signed by Hayden Pedro, PA-C on 11/15/2022 Stage prefix: Initial diagnosis Method of lymph node assessment: Core biopsy Histologic grading system: 3 grade system      RISK FACTORS:  Menarche was at age 59.  First live birth at age 78.  OCP use for approximately  35  years.  Uterus intact: no.  Menopausal status:  postmenopausal.  Colonoscopy: yes;  2023 . Mammogram within the last year: yes. Any excessive radiation exposure in the past: no  Past Medical History:  Diagnosis Date   Atypical chest pain    normal myoview 11/11/10   Blood transfusion without reported diagnosis    DVT (deep venous thrombosis) (HCC)    recurrent; chronically anticoagulated with coumadin   Family history of anesthesia complication    Sister had decrease in respirations after surgery   Heart murmur    Herniated lumbar intervertebral disc    Hypertension    Paroxysmal atrial fibrillation ([place])    SINCE AGE 83    PE (pulmonary embolism)    chronically anticoagulated with coumadin   Pneumonia 07/20/12   PONV (postoperative nausea and vomiting)    Rheumatoid arthritis (HCC)    Sickle cell trait (HCC)    Sinus congestion 08/14/2018   C/O NASAL/CHEST  CONGESTION, COUGHING WITH PHLEGM, PER PATIENT , SHE IS TO BEGIN TAKING AUGMENTIN DOSE PACK TODAY;     Strep throat at age 41   The patient reports a severe strep throat infection at age 54 and   is not clear as to whether or not she may have had rheumatic fever.   Symptomatic premature ventricular contractions    improved s/p ablation   Urgency of urination    Uterine bleeding 2008   Uterine artery embolization in 2008 for uterine bleeding.     Past Surgical History:  Procedure Laterality Date   BREAST BIOPSY Left 11/08/2022  [place] LT BREAST BX W LOC DEV EA ADD LESION IMG BX [place] US GUIDE 11/08/2022 [facility]   BREAST BIOPSY Left 11/08/2022   [place] LT BREAST BX W LOC DEV 1ST LESION IMG BX [place] US GUIDE 11/08/2022 [facility]   COLONOSCOPY W/ POLYPECTOMY     Diagnostic D&C hysteroscopy and Novasure ablation  03/17/2004   DILATION AND CURETTAGE OF UTERUS  12/17/2011   Procedure: DILATATION AND CURETTAGE;  Surgeon: Frederico Hamman, MD;  Location: [facility];  Service: Gynecology;  Laterality: N/A;  With Attempted Hydrothermal Ablation   LUMBAR  LAMINECTOMY/DECOMPRESSION MICRODISCECTOMY Left 02/21/2013   Procedure: LUMBAR LAMINECTOMY/DECOMPRESSION MICRODISCECTOMY 1 LEVEL;  Surgeon: Winfield Cunas, MD;  Location: [facility];  Service: Neurosurgery;  Laterality: Left;  LEFT Lumbar Three-Four Laminotomy foraminotomy microdiskectomy   MANDIBLE SURGERY     under bite   PVC Ablation  2010   ROBOTIC ASSISTED TOTAL HYSTERECTOMY WITH BILATERAL SALPINGO OOPHERECTOMY Bilateral 08/21/2018   Procedure: XI ROBOTIC ASSISTED TOTAL HYSTERECTOMY WITH BILATERAL SALPINGO OOPHORECTOMY;  Surgeon: Everitt Amber, MD;  Location: [facility];  Service: Gynecology;  Laterality: Bilateral;   SPINE SURGERY     TONSILLECTOMY     UTERINE ARTERY EMBOLIZATION  2008   for uterine bleeding   VASCULAR SURGERY     laser of left leg    Social History   Socioeconomic History   Marital status: Single    Spouse name: Not on file   Number of children: Not on file   Years of education: Not on file   Highest education level: Not on file  Occupational History   Occupation: Nurse  Tobacco Use   Smoking status: Former    Packs/day: 1.00    Years: 2.00    Additional pack years: 0.00    Total pack years: 2.00    Types: Cigarettes    Quit date: 02/13/1979    Years since quitting: 43.7    Passive exposure: Never   Smokeless tobacco: Never  Vaping Use   Vaping Use: Never used  Substance and Sexual Activity   Alcohol use: Yes    Comment: rarely   Drug use: No   Sexual activity: Not on file  Other Topics Concern   Not on file  Social History Narrative   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  Significant diagnoses are listed below: Family History   Problem Relation Age of Onset   Hypertension Mother    Diabetes Mother    Uterine cancer Sister 89   Prostate cancer Brother 29   Prostate cancer Brother 78   Prostate cancer Brother    Prostate cancer Maternal Uncle    Prostate cancer Maternal Uncle    Non-Hodgkin's lymphoma Paternal Aunt 80   Pancreatic cancer Paternal Aunt 87   Colon cancer Paternal Grandmother 46   Healthy Son    Breast cancer Neg Hx        Ms. Panciera has four brothers and one was diagnosed with prostate cancer at age 51, the second was diagnosed with prostate cancer at age 25. She has one sister who was diagnosed with uterine cancer at age 55, she died at  age 58. Two maternal uncles died due to metastatic prostate cancer. One paternal aunt was diagnosed with Non-Hodgkin's Lymphoma at age 84, she is deceased. A second paternal aunt was diagnosed with pancreatic cancer at age 81. Ms. Neiheisel paternal grandmother was diagnosed with colon cancer at age 52, she is deceased. Ms. Stolzman is unaware of previous family history of genetic testing for hereditary cancer risks. There is no reported Ashkenazi Jewish ancestry.   GENETIC COUNSELING ASSESSMENT: Ms. Glass is a 67 y.o. female with a personal and family history of cancer which is somewhat suggestive of a hereditary predisposition to cancer. We, therefore, discussed and recommended the following at today's visit.   DISCUSSION: We discussed that 5 - 10% of cancer is hereditary, with most cases of breast cancer associated with BRCA1/2.  There are other genes that can be associated with hereditary breast cancer syndromes.  We discussed that testing is beneficial for several reasons including knowing how to follow individuals after completing their treatment, identifying whether potential treatment options would be beneficial, and understanding if other family members could be at risk for cancer and allowing them to undergo genetic testing.   We reviewed the  characteristics, features and inheritance patterns of hereditary cancer syndromes. We also discussed genetic testing, including the appropriate family members to test, the process of testing, insurance coverage and turn-around-time for results. We discussed the implications of a negative, positive, carrier and/or variant of uncertain significant result. We recommended Ms. Baird Cancer pursue genetic testing for a panel that includes genes associated with breast, prostate, pancreatic, and colon cancer.   Ms. Orlosky elected to have [facility] Custom Cancer Panel. The Custom Hereditary Cancers Panel offered by [facility] includes sequencing and/or deletion duplication testing of the following 43 genes: APC, ATM, AXIN2, BAP1, BARD1, BMPR1A, BRCA1, BRCA2, BRIP1, CDH1, CDK4, CDKN2A (p14ARF and p16INK4a only), CHEK2, CTNNA1, EPCAM (Deletion/duplication testing only), FH, GREM1 (promoter region duplication testing only), HOXB13, KIT, MBD4, MEN1, MLH1, MSH2, MSH3, MSH6, MUTYH, NF1, NHTL1, PALB2, PDGFRA, PMS2, POLD1, POLE, PTEN, RAD51C, RAD51D, SMAD4, SMARCA4. STK11, TP53, TSC1, TSC2, and VHL.  Based on Ms. Jarrells's personal and family history of cancer, she meets medical criteria for genetic testing. Despite that she meets criteria, she may still have an out of pocket cost. We discussed that if her out of pocket cost for testing is over $100, the laboratory will call and confirm whether she wants to proceed with testing.  If the out of pocket cost of testing is less than $100 she will be billed by the genetic testing laboratory.   PLAN: After considering the risks, benefits, and limitations, Ms. Baird Cancer provided informed consent to pursue genetic testing and the blood sample was sent to [facility] for analysis of the Custom Panel. Results should be available within approximately 2-3 weeks' time, at which point they will be disclosed by telephone to Ms. Baird Cancer, as will any additional recommendations warranted by these  results. Ms. Timmermans will receive a summary of her genetic counseling visit and a copy of her results once available. This information will also be available in Epic.   Ms. Morgese questions were answered to her satisfaction today. Our contact information was provided should additional questions or concerns arise. Thank you for the referral and allowing [place] to share in the care of your patient.   Lucille Passy, MS, [facility] Genetic Counselor [email or web address] (P) [phone]  The patient was seen for a total of 20 minutes in face-to-face genetic counseling.  The patient was seen alone.  Drs. Lindi Adie and/or Burr Medico were available to discuss this case as needed.   _______________________________________________________________________ For Office Staff:  Number of people involved in session: 1 Was an Intern/ student involved with case: no

## 2022-11-19 ENCOUNTER — Encounter: Payer: Self-pay | Admitting: Genetic Counselor

## 2022-11-19 DIAGNOSIS — Z8042 Family history of malignant neoplasm of prostate: Secondary | ICD-10-CM | POA: Insufficient documentation

## 2022-11-22 ENCOUNTER — Other Ambulatory Visit: Payer: Self-pay | Admitting: Obstetrics

## 2022-11-22 ENCOUNTER — Other Ambulatory Visit: Payer: Self-pay | Admitting: Surgery

## 2022-11-22 ENCOUNTER — Telehealth: Payer: Self-pay | Admitting: Radiology

## 2022-11-22 ENCOUNTER — Other Ambulatory Visit (HOSPITAL_COMMUNITY): Payer: Self-pay

## 2022-11-22 DIAGNOSIS — F419 Anxiety disorder, unspecified: Secondary | ICD-10-CM

## 2022-11-22 DIAGNOSIS — C50912 Malignant neoplasm of unspecified site of left female breast: Secondary | ICD-10-CM

## 2022-11-22 MED ORDER — ALPRAZOLAM 0.5 MG PO TABS
0.5000 mg | ORAL_TABLET | Freq: Two times a day (BID) | ORAL | 2 refills | Status: DC
  Filled 2022-11-22: qty 60, 30d supply, fill #0
  Filled 2023-01-27: qty 60, 30d supply, fill #1
  Filled 2023-04-26: qty 60, 30d supply, fill #2

## 2022-11-22 NOTE — Telephone Encounter (Signed)
[employer] 2021-05 - Specimen Collection Study to Evaluate Biomarkers in Subjects with Cancer    11/22/2022  PHONE CALL: Confirmed I was speaking with Diamantina [facility] . Informed patient reason for call is to follow-up on the above mentioned study. Patient expressed interest in participation. A time and day that worked best was scheduled (April 1st at [facility]). This coordinator requested appts to be scheduled. Patient was thanked for her time and consideration of the above mentioned study.   Carol Ada, RT(R)(T) Clinical Research Coordinator

## 2022-11-24 ENCOUNTER — Other Ambulatory Visit: Payer: Self-pay

## 2022-11-24 ENCOUNTER — Encounter: Payer: Self-pay | Admitting: *Deleted

## 2022-11-24 ENCOUNTER — Telehealth: Payer: Self-pay | Admitting: *Deleted

## 2022-11-24 ENCOUNTER — Other Ambulatory Visit (HOSPITAL_COMMUNITY): Payer: Self-pay

## 2022-11-24 DIAGNOSIS — Z170 Estrogen receptor positive status [ER+]: Secondary | ICD-10-CM

## 2022-11-24 NOTE — Telephone Encounter (Signed)
Spoke with patient to follow up from [facility] and assess navigation needs. Patient denies any questions or concerns at this time. Encouraged her to call should anything arise. Patient verbalized understanding.  

## 2022-11-25 ENCOUNTER — Telehealth: Payer: Self-pay | Admitting: Hematology and Oncology

## 2022-11-25 NOTE — Telephone Encounter (Signed)
Scheduled appointment per scheduling message. Left message.

## 2022-11-29 ENCOUNTER — Inpatient Hospital Stay: Payer: Commercial Managed Care - HMO

## 2022-11-29 ENCOUNTER — Inpatient Hospital Stay: Payer: Commercial Managed Care - HMO | Attending: Hematology and Oncology | Admitting: Radiology

## 2022-11-29 ENCOUNTER — Other Ambulatory Visit: Payer: Self-pay

## 2022-11-29 DIAGNOSIS — Z170 Estrogen receptor positive status [ER+]: Secondary | ICD-10-CM

## 2022-11-29 LAB — RESEARCH LABS

## 2022-11-29 NOTE — Research (Signed)
[employer] 2021-05 - Specimen Collection Study to Evaluate Biomarkers in Subjects with Cancer    This Nurse has reviewed this patient's inclusion and exclusion criteria as a second review and confirms Pamela Davenport is eligible for study participation.  Patient may continue with enrollment.  Marjie Skiff Banessa Mao, RN, BSN, [facility]  Her  [facility] Clinical Research Nurse [facility] Direct Dial [phone]  Pager [phone] 11/29/2022 1:46 PM

## 2022-11-29 NOTE — Research (Signed)
[employer] 2021-05 - Specimen Collection Study to Evaluate Biomarkers in Subjects with Cancer    11/29/2022  ELIGIBILITY:  This Coordinator has reviewed this patient's inclusion and exclusion criteria and confirmed Pamela Davenport is eligible for study participation.  Patient will continue with enrollment.  Menopausal status (women only): Pamela Davenport is post menopausal  (hysterectomy).   Eligibility confirmed by treating investigator, who also agrees that patient should proceed with enrollment.   CONSENT VISIT: Patient was previously provided consent documentation for review. Patient Pamela Davenport was identified by Dr. Lindi Adie as a potential candidate for the above listed study.  This Clinical Research Coordinator met with Pamela Davenport, [id number] on 11/29/22 in a manner and location that ensures patient privacy to discuss participation in the above listed research study.  Patient is Unaccompanied.  Patient was previously provided with informed consent documents.  Patient confirmed they have read the informed consent documents.  As outlined in the informed consent form, this Coordinator and SHAKISHA HERRAN discussed the purpose of the research study, the investigational nature of the study, study procedures and requirements for study participation, potential risks and benefits of study participation, as well as alternatives to participation.  This study is not blinded or double-blinded. The patient understands participation is voluntary and they may withdraw from study participation at any time.  This study does not involve randomization.  This study does not involve an investigational drug or device. This study does not involve a placebo. Patient understands enrollment is pending full eligibility review.   Confidentiality and how the patient's information will be used as part of study participation were discussed.  Patient was informed there is reimbursement provided for their time and  effort spent on trial participation.  The patient is encouraged to discuss research study participation with their insurance provider to determine what costs they may incur as part of study participation, including research related injury.    All questions were answered to patient's satisfaction.  The informed consent with embedded HIPAA language was reviewed page by page.  The patient's mental and emotional status is appropriate to provide informed consent, and the patient verbalizes an understanding of study participation.  Patient has agreed to participate in the above listed research study and has voluntarily signed the informed consent version 12 Sep 2020 with embedded HIPAA language, version 12 Sep 2020  on 11/29/22 at 115PM.  The patient was provided with a copy of the signed informed consent form with embedded HIPAA language for their reference.  No study specific procedures were obtained prior to the signing of the informed consent document.  Approximately 20 minutes were spent with the patient reviewing the informed consent documents.  After consent documentation was complete, patient provided the following medical history:   Medical History:  High Blood Pressure  Yes Coronary Artery Disease No Lupus    No Rheumatoid Arthritis  Yes Diabetes   No      Lynch Syndrome  No  Is the patient currently taking a magnesium supplement?   No  Does the patient have a personal history of cancer (greater than 5 years ago)?  No  Does the patient have a family history of cancer in 1st or 2nd degree relatives? Yes If yes, Relationship(s) and Cancer type(s)?  Brothers: prostate x 2 Sister: uterine Maternal uncle: prostate x2 Paternal aunt: multiple myeloma; pancreatic Paternal grandmother: colon Paternal grandfather: prostate  Does the patient have history of alcohol consumption? Yes   If yes, current or  former? current Number of years? 40 yrs Drinks per week? Less than 1 per week  Does the  patient have history of cigarette, cigar, pipe, or chewing tobacco use?  Yes  If yes, current for former? former If yes, type (Cigarette, cigar, pipe, and/or chewing tobacco)? Cigarette If former, year stopped? 1976 (per patient, she smoked ages [age]yrs old) Number of years? 3 Packs/number/containers per day? 1/2 pack   After completion of medical history, patient was escorted by Farris Has, research assistant to the lab for blood collection: Blood Collection: Research blood obtained by [facility] venipuncture. Patient tolerated well without any adverse events. Gift Card: $50 gift card given to patient for her participation in this study.    Patient was thanked for her time and support of the above mentioned study. Patient was encouraged to call this coordinator with any future questions/concerns or needs.   Carol Ada, RT(R)(T) Clinical Research Coordinator

## 2022-11-30 ENCOUNTER — Encounter: Payer: Self-pay | Admitting: Genetic Counselor

## 2022-11-30 ENCOUNTER — Telehealth: Payer: Self-pay | Admitting: Genetic Counselor

## 2022-11-30 DIAGNOSIS — Z1379 Encounter for other screening for genetic and chromosomal anomalies: Secondary | ICD-10-CM | POA: Insufficient documentation

## 2022-11-30 NOTE — Telephone Encounter (Signed)
I contacted Ms. Daffern to discuss her genetic testing results. No pathogenic variants were identified in the 43 genes analyzed. Detailed clinic note to follow.  The test report has been scanned into EPIC and is located under the Molecular Pathology section of the Results Review tab.  A portion of the result report is included below for reference.   Lucille Passy, MS, [facility] Genetic Counselor [email or web address] (P) [phone]

## 2022-12-01 ENCOUNTER — Encounter: Payer: Self-pay | Admitting: General Practice

## 2022-12-01 NOTE — Progress Notes (Signed)
[place] Spiritual Care Note  Reached Ms Graessle by phone for [facility] follow-up call. She reports good support from her many nurse friends. At this time she doesn't feel the need for an [employer], because she has friends who have been through their own cancer treatments, but she plans to phone chaplain for referral if that changes.  Provided empathic listening, emotional support, and affirmation of strengths. We plan to follow up by phone for a post-op pastoral check-in.  [place], [place], [facility] Pager [phone] Voicemail [phone]

## 2022-12-07 ENCOUNTER — Ambulatory Visit: Payer: Medicare HMO

## 2022-12-07 ENCOUNTER — Ambulatory Visit: Payer: Medicare HMO | Attending: Rheumatology | Admitting: Rheumatology

## 2022-12-07 DIAGNOSIS — M65331 Trigger finger, right middle finger: Secondary | ICD-10-CM | POA: Diagnosis not present

## 2022-12-07 DIAGNOSIS — M65321 Trigger finger, right index finger: Secondary | ICD-10-CM | POA: Diagnosis not present

## 2022-12-07 MED ORDER — LIDOCAINE HCL 1 % IJ SOLN
0.5000 mL | INTRAMUSCULAR | Status: AC | PRN
  Administered 2022-12-07: .5 mL

## 2022-12-07 MED ORDER — TRIAMCINOLONE ACETONIDE 40 MG/ML IJ SUSP
20.0000 mg | INTRAMUSCULAR | Status: AC | PRN
Start: 2022-12-07 — End: 2022-12-07
  Administered 2022-12-07: 20 mg

## 2022-12-07 MED ORDER — LIDOCAINE HCL 1 % IJ SOLN
0.5000 mL | INTRAMUSCULAR | Status: AC | PRN
Start: 2022-12-07 — End: 2022-12-07
  Administered 2022-12-07: .5 mL

## 2022-12-07 NOTE — Progress Notes (Signed)
   Procedure Note  Patient: Pamela Davenport             Date of Birth: [birth year]           MRN: [id number]             Visit Date: 12/07/2022  Procedures: Visit Diagnoses:   Patient was seen today as she has been experiencing triggering of the right index and middle finger.  She had good response to left ring trigger finger in the past.  She requested a cortisone injection to right index finger and right middle finger.  Thickening of the right index finger flexor tendon A1 pulley and right middle finger A2 pulley were noted.  After informed consent was obtained and side effects were discussed the procedures were performed as described below.   1. Pain of right middle finger   2. Pain in right finger(s)     Hand/UE Inj: R index A1 for trigger finger on 12/07/2022 2:20 PM Indications: pain, tendon swelling and therapeutic Details: 27 G needle, ultrasound-guided volar approach Medications: 0.5 mL lidocaine 1 %; 20 mg triamcinolone acetonide 40 MG/ML Aspirate: 0 mL Procedure, treatment alternatives, risks and benefits explained, specific risks discussed. Consent was given by the patient. Immediately prior to procedure a time out was called to verify the correct patient, procedure, equipment, support staff and site/side marked as required. Patient was prepped and draped in the usual sterile fashion.    Hand/UE Inj: L long A2 for trigger finger on 12/07/2022 2:21 PM Indications: pain, tendon swelling and therapeutic Details: 27 G needle, ultrasound-guided volar approach Medications: 0.5 mL lidocaine 1 %; 20 mg triamcinolone acetonide 40 MG/ML Aspirate: 0 mL Procedure, treatment alternatives, risks and benefits explained, specific risks discussed. Consent was given by the patient. Immediately prior to procedure a time out was called to verify the correct patient, procedure, equipment, support staff and site/side marked as required. Patient was prepped and draped in the usual sterile fashion.      Patient tolerated the procedure well.  Postprocedure instructions were given.  Pollyann Savoy, MD

## 2022-12-09 DIAGNOSIS — R350 Frequency of micturition: Secondary | ICD-10-CM | POA: Diagnosis not present

## 2022-12-09 DIAGNOSIS — N3941 Urge incontinence: Secondary | ICD-10-CM | POA: Diagnosis not present

## 2022-12-09 DIAGNOSIS — R351 Nocturia: Secondary | ICD-10-CM | POA: Diagnosis not present

## 2022-12-10 ENCOUNTER — Ambulatory Visit: Payer: Self-pay | Admitting: Genetic Counselor

## 2022-12-10 ENCOUNTER — Encounter: Payer: Self-pay | Admitting: Genetic Counselor

## 2022-12-10 DIAGNOSIS — Z1379 Encounter for other screening for genetic and chromosomal anomalies: Secondary | ICD-10-CM

## 2022-12-10 NOTE — Progress Notes (Signed)
HPI:   Pamela Davenport was previously seen in the [facility] due to a personal and family history of cancer and concerns regarding a hereditary predisposition to cancer. Please refer to our prior cancer genetics clinic note for more information regarding our discussion, assessment and recommendations, at the time. Pamela Davenport recent genetic test results were disclosed to her, as were recommendations warranted by these results. These results and recommendations are discussed in more detail below.  CANCER HISTORY:  Oncology History  Malignant neoplasm of lower-inner quadrant of left breast in female, estrogen receptor positive  11/08/2022 Surgery   Screening mammogram detected left breast masses at 8 o'clock position: 1.1 cm: Grade 1 IDC ER 95%, PR 80%, HER2 negative 0.7 cm mass: Grade 1 IDC ER 100%, PR 90%, HER2 negative, Ki-67 10%, multiple lymph nodes 1 was biopsied and benign discordant   11/15/2022 Initial Diagnosis   Primary malignant neoplasm of lower outer quadrant of female breast, left (HCC)   11/15/2022 Cancer Staging   Staging form: Breast, AJCC 8th Edition - Clinical stage from 11/15/2022: Stage IA (cT1c, cN0(f), cM0, G1, ER+, PR+, HER2-) - Signed by Ronny Bacon, PA-C on 11/15/2022 Stage prefix: Initial diagnosis Method of lymph node assessment: Core biopsy Histologic grading system: 3 grade system    Genetic Testing   [facility] Custom Cancer Panel+RNA was Negative. Report date is 11/29/2022.  The Custom Hereditary Cancers Panel offered by [facility] includes sequencing and/or deletion duplication testing of the following 43 genes: APC, ATM, AXIN2, BAP1, BARD1, BMPR1A, BRCA1, BRCA2, BRIP1, CDH1, CDK4, CDKN2A (p14ARF and p16INK4a only), CHEK2, CTNNA1, EPCAM (Deletion/duplication testing only), FH, GREM1 (promoter region duplication testing only), HOXB13, KIT, MBD4, MEN1, MLH1, MSH2, MSH3, MSH6, MUTYH, NF1, NHTL1, PALB2, PDGFRA, PMS2, POLD1, POLE, PTEN, RAD51C,  RAD51D, SMAD4, SMARCA4. STK11, TP53, TSC1, TSC2, and VHL.     FAMILY HISTORY:  We obtained a detailed, 4-generation family history.  Significant diagnoses are listed below:      Family History  Problem Relation Age of Onset   Hypertension Mother     Diabetes Mother     Uterine cancer Sister 53   Prostate cancer Brother 90   Prostate cancer Brother 100   Prostate cancer Brother     Prostate cancer Maternal Uncle     Prostate cancer Maternal Uncle     Non-Hodgkin's lymphoma Paternal Aunt 41   Pancreatic cancer Paternal Aunt 60   Colon cancer Paternal Dav[place] 13   Healthy Son     Breast cancer Neg Hx             Pamela Davenport has four brothers and one was diagnosed with prostate cancer at age 65, the second was diagnosed with prostate cancer at age 75. She has one sister who was diagnosed with uterine cancer at age 70, she died at age 36. Two maternal uncles died due to metastatic prostate cancer. One paternal aunt was diagnosed with Non-Hodgkin's Lymphoma at age 73, she is deceased. A second paternal aunt was diagnosed with pancreatic cancer at age 46. Pamela Davenport was diagnosed with colon cancer at age 51, she is deceased. Pamela Davenport is unaware of previous family history of genetic testing for hereditary cancer risks. There is no reported Ashkenazi Jewish ancestry.    GENETIC TEST RESULTS:  The [facility] Custom Panel found no pathogenic mutations.   The Custom Hereditary Cancers Panel offered by [facility] includes sequencing and/or deletion duplication testing of the following 43 genes: APC, ATM, AXIN2,  BAP1, BARD1, BMPR1A, BRCA1, BRCA2, BRIP1, CDH1, CDK4, CDKN2A (p14ARF and p16INK4a only), CHEK2, CTNNA1, EPCAM (Deletion/duplication testing only), FH, GREM1 (promoter region duplication testing only), HOXB13, KIT, MBD4, MEN1, MLH1, MSH2, MSH3, MSH6, MUTYH, NF1, NHTL1, PALB2, PDGFRA, PMS2, POLD1, POLE, PTEN, RAD51C, RAD51D, SMAD4, SMARCA4. STK11, TP53, TSC1, TSC2, and  VHL.  The test report has been scanned into EPIC and is located under the Molecular Pathology section of the Results Review tab.  A portion of the Davenport report is included below for reference. Genetic testing reported out on 11/29/2022.      Even though a pathogenic variant was not identified, possible explanations for the cancer in the family may include: There may be no hereditary risk for cancer in the family. The cancers in Pamela Davenport and/or her family may be due to other genetic or environmental factors. There may be a gene mutation in one of these genes that current testing methods cannot detect, but that chance is small. There could be another gene that has not yet been discovered, or that we have not yet tested, that is responsible for the cancer diagnoses in the family.  It is also possible there is a hereditary cause for the cancer in the family that Pamela Davenport did not inherit.  Therefore, it is important to remain in touch with cancer genetics in the future so that we can continue to offer Pamela Davenport the most up to date genetic testing.   ADDITIONAL GENETIC TESTING:  We discussed with Pamela Davenport that her genetic testing was fairly extensive.  If there are genes identified to increase cancer risk that can be analyzed in the future, we would be happy to discuss and coordinate this testing at that time.    CANCER SCREENING RECOMMENDATIONS:  Pamela Davenport is considered negative (normal).  This means that we have not identified a hereditary cause for her personal and family history of cancer at this time. An individual's cancer risk and medical management are not determined by genetic test results alone. Overall cancer risk assessment incorporates additional factors, including personal medical history, family history, and any available genetic information that may Davenport in a personalized plan for cancer prevention and surveillance. Therefore, it is recommended she continue to  follow the cancer management and screening guidelines provided by her oncology and primary healthcare provider.  RECOMMENDATIONS FOR FAMILY MEMBERS:   Since she did not inherit a mutation in a cancer predisposition gene included on this panel, her son could not have inherited a mutation from her in one of these genes. Individuals in this family might be at some increased risk of developing cancer, over the general population risk, due to the family history of cancer. We recommend women in this family have a yearly mammogram beginning at age 46, or 10 years younger than the earliest onset of cancer, an annual clinical breast exam, and perform monthly breast self-exams.  Other members of the family may still carry a pathogenic variant in one of these genes that Pamela Davenport did not inherit. Based on the family history, we recommend her brothers who have a history of prostate cancer consider genetic counseling and testing.   FOLLOW-UP:  Cancer genetics is a rapidly advancing field and it is possible that new genetic tests will be appropriate for her and/or her family members in the future. We encouraged her to remain in contact with cancer genetics on an annual basis so we can update her personal and family histories and let her know  of advances in cancer genetics that may benefit this family.   Our contact number was provided. Pamela Davenport questions were answered to her satisfaction, and she knows she is welcome to call us at anytime with additional questions or concerns.   Lalla Brothers, MS, [facility] Genetic Counselor [email or web address] (P) [phone]

## 2022-12-13 DIAGNOSIS — R310 Gross hematuria: Secondary | ICD-10-CM | POA: Diagnosis not present

## 2022-12-14 ENCOUNTER — Encounter (HOSPITAL_BASED_OUTPATIENT_CLINIC_OR_DEPARTMENT_OTHER): Payer: Self-pay | Admitting: Surgery

## 2022-12-14 ENCOUNTER — Other Ambulatory Visit: Payer: Self-pay

## 2022-12-14 ENCOUNTER — Telehealth: Payer: Self-pay | Admitting: *Deleted

## 2022-12-14 NOTE — Telephone Encounter (Signed)
   Pre-operative Risk Assessment    Patient Name: Pamela Davenport  DOB: Sep 09, 1955 MRN: [id number]      Request for Surgical Clearance    Procedure:   LUMPECTOMY  Date of Surgery:  Clearance TBD                                 Surgeon:  DR. Harriette Bouillon Surgeon's Group or Practice Name:  [facility] Phone number:  [phone] Fax number:  [phone]   Type of Clearance Requested:   - Medical  - Pharmacy:  Hold Rivaroxaban (Xarelto) NOT INDICATED HOW LONG   Type of Anesthesia:  General    Additional requests/questions:    Wilhemina Cash   12/14/2022, 4:23 PM

## 2022-12-16 ENCOUNTER — Encounter (HOSPITAL_BASED_OUTPATIENT_CLINIC_OR_DEPARTMENT_OTHER)
Admission: RE | Admit: 2022-12-16 | Discharge: 2022-12-16 | Disposition: A | Discharge status: Home / Self Care | Payer: Medicare HMO | Source: Ambulatory Visit | Attending: Surgery | Admitting: Surgery

## 2022-12-16 DIAGNOSIS — Z01812 Encounter for preprocedural laboratory examination: Secondary | ICD-10-CM | POA: Diagnosis not present

## 2022-12-16 LAB — BASIC METABOLIC PANEL
Anion gap: 9 (ref 5–15)
BUN: 29 mg/dL — ABNORMAL HIGH (ref 8–23)
CO2: 22 mmol/L (ref 22–32)
Calcium: 9.3 mg/dL (ref 8.9–10.3)
Chloride: 105 mmol/L (ref 98–111)
Creatinine, Ser: 1.28 mg/dL — ABNORMAL HIGH (ref 0.44–1.00)
GFR, Estimated: 46 mL/min — ABNORMAL LOW (ref >60)
Glucose, Bld: 82 mg/dL (ref 70–99)
Potassium: 4.4 mmol/L (ref 3.5–5.1)
Sodium: 136 mmol/L (ref 135–145)

## 2022-12-16 NOTE — Telephone Encounter (Signed)
Primary Cardiologist:James Allred, MD (Inactive)   Preoperative team, please contact this patient and set up a phone call appointment for further preoperative risk assessment. Please obtain consent and complete medication review. Thank you for your help.   I confirm that guidance regarding antiplatelet and oral anticoagulation therapy has been completed and, if necessary, noted below.  Levi Aland, NP-C  12/16/2022, 11:33 AM [address] Office [phone] Fax [phone]

## 2022-12-16 NOTE — Telephone Encounter (Signed)
Left message for the pt to call back to set up tele pre op appt add on tomorrow 12/17/22 per pre op APP today. Pt does also have an appt with Dr. Elberta Fortis 12/31/22.

## 2022-12-16 NOTE — Progress Notes (Signed)

## 2022-12-16 NOTE — Telephone Encounter (Signed)
Patient with diagnosis of afib and recurrent DVT/PE on Xarelto for anticoagulation.    Procedure: lumpectomy Date of procedure: TBD  CHA2DS2-VASc Score = 3  This indicates a 3.2% annual risk of stroke. The patient's score is based upon: CHF History: 0 HTN History: 1 Diabetes History: 0 Stroke History: 0 Vascular Disease History: 0 Age Score: 1 Gender Score: 1   CrCl 52mL/min using adj body weight Platelet count 275K  CHADS2VASc score is low but pt at higher risk of VTE off of anticoag given hx of recurrent VTE. Ok to hold Xarelto for 2 days, should resume as soon as safely possible after.  **This guidance is not considered finalized until pre-operative APP has relayed final recommendations.**

## 2022-12-17 ENCOUNTER — Other Ambulatory Visit: Payer: Self-pay | Admitting: Surgery

## 2022-12-17 ENCOUNTER — Ambulatory Visit: Payer: Medicare HMO | Attending: Nurse Practitioner | Admitting: Nurse Practitioner

## 2022-12-17 ENCOUNTER — Ambulatory Visit
Admission: RE | Admit: 2022-12-17 | Discharge: 2022-12-17 | Disposition: A | Discharge status: Home / Self Care | Payer: Medicare HMO | Source: Ambulatory Visit | Attending: Surgery | Admitting: Surgery

## 2022-12-17 ENCOUNTER — Encounter: Payer: Self-pay | Admitting: Nurse Practitioner

## 2022-12-17 ENCOUNTER — Ambulatory Visit
Admission: RE | Admit: 2022-12-17 | Discharge: 2022-12-17 | Disposition: A | Discharge status: Home / Self Care | Payer: Medicare Other | Source: Ambulatory Visit | Attending: Surgery | Admitting: Surgery

## 2022-12-17 ENCOUNTER — Telehealth: Payer: Self-pay | Admitting: *Deleted

## 2022-12-17 DIAGNOSIS — Z01810 Encounter for preprocedural cardiovascular examination: Secondary | ICD-10-CM | POA: Diagnosis not present

## 2022-12-17 DIAGNOSIS — C50912 Malignant neoplasm of unspecified site of left female breast: Secondary | ICD-10-CM

## 2022-12-17 DIAGNOSIS — C50312 Malignant neoplasm of lower-inner quadrant of left female breast: Secondary | ICD-10-CM | POA: Diagnosis not present

## 2022-12-17 DIAGNOSIS — R590 Localized enlarged lymph nodes: Secondary | ICD-10-CM | POA: Diagnosis not present

## 2022-12-17 DIAGNOSIS — Z923 Personal history of irradiation: Secondary | ICD-10-CM | POA: Diagnosis not present

## 2022-12-17 HISTORY — PX: BREAST BIOPSY: SHX20

## 2022-12-17 NOTE — Progress Notes (Signed)
Virtual Visit via Telephone Note   Because of Pamela Davenport's co-morbid illnesses, she is at least at moderate risk for complications without adequate follow up.  This format is felt to be most appropriate for this patient at this time.  The patient did not have access to video technology/had technical difficulties with video requiring transitioning to audio format only (telephone).  All issues noted in this document were discussed and addressed.  No physical exam could be performed with this format.  Please refer to the patient's chart for her consent to telehealth for [facility].  Evaluation Performed:  Preoperative cardiovascular risk assessment _____________   Date:  12/17/2022   Patient ID:  Pamela Davenport, DOB 10/01/1955, MRN [id number] Patient Location:  Home Provider location:   Office  Primary Care Provider:  [name], MD Primary Cardiologist:  Lanier Prude, MD  Chief Complaint / Patient Profile   67 y.o. y/o female with a h/o PE, PAF on chronic anticoagulation, HTN, HLD who is pending left breast lumpectomy and presents today for telephonic preoperative cardiovascular risk assessment.  History of Present Illness    Pamela Davenport is a 67 y.o. female who presents via audio/video conferencing for a telehealth visit today.  Pt was last seen in cardiology clinic on 04/07/22 by Francis Dowse.  At that time Pamela Davenport was doing well . The patient is now pending procedure as outlined above. Since her last visit, she denies chest pain, shortness of breath, lower extremity edema, fatigue, palpitations, melena, hematuria, hemoptysis, diaphoresis, weakness, presyncope, syncope, orthopnea, and PND. She had virtual visit on 06/21/22 for coloscopy/EGD and was deemed acceptable risk. Reports she has regular palpitations that occur for a few seconds several times per week. She rarely takes flecainide. No significant symptoms with walking 30 minutes daily.   Past  Medical History    Past Medical History:  Diagnosis Date   Atypical chest pain    normal myoview 11/11/10   Blood transfusion without reported diagnosis    DVT (deep venous thrombosis)    recurrent; chronically anticoagulated with coumadin   Dysrhythmia    Family history of anesthesia complication    Sister had decrease in respirations after surgery   Heart murmur    Herniated lumbar intervertebral disc    Hypertension    Paroxysmal atrial fibrillation    SINCE AGE 36    PE (pulmonary embolism)    chronically anticoagulated with coumadin   Pneumonia 07/20/2012   PONV (postoperative nausea and vomiting)    Rheumatoid arthritis    Sickle cell trait    Sinus congestion 08/14/2018   C/O NASAL/CHEST  CONGESTION, COUGHING WITH PHLEGM, PER PATIENT , SHE IS TO BEGIN TAKING AUGMENTIN DOSE PACK TODAY;     Strep throat at age 37   The patient reports a severe strep throat infection at age 24 and   is not clear as to whether or not she may have had rheumatic fever.   Symptomatic premature ventricular contractions    improved s/p ablation   Urgency of urination    Uterine bleeding 2008   Uterine artery embolization in 2008 for uterine bleeding.    Past Surgical History:  Procedure Laterality Date   BREAST BIOPSY Left 11/08/2022   [place] LT BREAST BX W LOC DEV EA ADD LESION IMG BX SPEC US GUIDE 11/08/2022 [facility]   BREAST BIOPSY Left 11/08/2022   [place] LT BREAST BX W LOC DEV 1ST LESION IMG BX SPEC  US GUIDE 11/08/2022 [facility]   COLONOSCOPY W/ POLYPECTOMY     Diagnostic D&C hysteroscopy and Novasure ablation  03/17/2004   DILATION AND CURETTAGE OF UTERUS  12/17/2011   Procedure: DILATATION AND CURETTAGE;  Surgeon: Kathreen Cosier, MD;  Location: [facility];  Service: Gynecology;  Laterality: N/A;  With Attempted Hydrothermal Ablation   LUMBAR LAMINECTOMY/DECOMPRESSION MICRODISCECTOMY Left 02/21/2013   Procedure: LUMBAR LAMINECTOMY/DECOMPRESSION MICRODISCECTOMY 1 LEVEL;  Surgeon:  Carmela Hurt, MD;  Location: [facility];  Service: Neurosurgery;  Laterality: Left;  LEFT Lumbar Three-Four Laminotomy foraminotomy microdiskectomy   MANDIBLE SURGERY     under bite   PVC Ablation  2010   ROBOTIC ASSISTED TOTAL HYSTERECTOMY WITH BILATERAL SALPINGO OOPHERECTOMY Bilateral 08/21/2018   Procedure: XI ROBOTIC ASSISTED TOTAL HYSTERECTOMY WITH BILATERAL SALPINGO OOPHORECTOMY;  Surgeon: Adolphus Birchwood, MD;  Location: [facility];  Service: Gynecology;  Laterality: Bilateral;   SPINE SURGERY     TONSILLECTOMY     UTERINE ARTERY EMBOLIZATION  2008   for uterine bleeding   VASCULAR SURGERY     laser of left leg    Allergies  Allergies  Allergen Reactions   Methylprednisolone     REACTION: Atrial fibrillation at end of medrol dose pack   Sulfa Antibiotics Hives   Sulfonamide Derivatives Hives   Macrobid [Nitrofurantoin]     Muscle tightening and cramps     Home Medications    Prior to Admission medications   Medication Sig Start Date End Date Taking? Authorizing Provider  clindamycin (CLEOCIN) 300 MG capsule Take 1 capsule (300 mg total) by mouth 3 (three) times daily. Patient not taking: Reported on 11/04/2022 08/19/22   Brock Bad, MD  ALPRAZolam Prudy Feeler) 0.5 MG tablet Take 1 tablet (0.5 mg total) by mouth 2 (two) times daily. 11/22/22   Brock Bad, MD  clindamycin (CLINDAGEL) 1 % gel Apply 1 application topically every day Patient not taking: Reported on 11/04/2022 06/07/22     esomeprazole (NEXIUM) 40 MG capsule Take 1 capsule (40 mg total) by mouth daily. 05/04/22   [name], MD  flecainide [facility]) 150 MG tablet Take 2 tablets by mouth at the onset of AFIB 06/22/21   Graciella Freer, PA-C  hydroxychloroquine (PLAQUENIL) 200 MG tablet Take 1 tablet (200 mg total) by mouth 2 (two) times daily. 11/04/22   Pollyann Savoy, MD  hydrOXYzine (ATARAX) 25 MG tablet Take 1 tablet (25 mg total) by mouth 3 (three) times daily as needed.  08/19/22   Brock Bad, MD  indapamide (LOZOL) 1.25 MG tablet Take 1 tablet (1.25 mg total) by mouth daily. 09/29/22   [name], MD  losartan (COZAAR) 100 MG tablet Take 1 tablet (100 mg total) by mouth daily. 07/01/22   Graciella Freer, PA-C  nadolol (CORGARD) 20 MG tablet Take 1 tablet (20 mg total) by mouth daily. 07/30/22   Allred, Fayrene Fearing, MD  nystatin-triamcinolone ointment [place] [employer]) Apply to affected areas two times a day 08/09/21     polyethylene glycol-electrolytes (NULYTELY) 420 g solution Use as directed for 2 day prep. Patient not taking: Reported on 11/04/2022 06/07/22     rivaroxaban (XARELTO) 20 MG TABS tablet Take 1 tablet (20 mg total) by mouth daily with supper. 07/30/22 07/30/23  Allred, Fayrene Fearing, MD  TART CHERRY PO Take 2 tablets by mouth daily.    [provider]  thiamine (VITAMIN B-1) 50 MG tablet Take 1 tablet (50 mg total) by mouth daily. 05/07/22  [name], MD    Physical Exam    Vital Signs:  Pamela Davenport does not have vital signs available for review today.  Given telephonic nature of communication, physical exam is limited. AAOx3. NAD. Normal affect.  Speech and respirations are unlabored.  Accessory Clinical Findings    None  Assessment & Plan    1.  Preoperative Cardiovascular Risk Assessment: According to the Revised Cardiac Risk Index (RCRI), her Perioperative Risk of Major Cardiac Event is (%): 0.4. Her Functional Capacity in METs is: 7.59 according to the Duke Activity Status Index (DASI). The patient is doing well from a cardiac perspective. Therefore, based on ACC/AHA guidelines, the patient would be at acceptable risk for the planned procedure without further cardiovascular testing.   The patient was advised that if she develops new symptoms prior to surgery to contact our office to arrange for a follow-up visit, and she verbalized understanding.  CHADS2VASc score is low but pt at higher risk of VTE off of anticoag given hx  of recurrent VTE. Ok to hold Xarelto for 2 days, should resume as soon as safely possible after.   A copy of this note will be routed to requesting surgeon.  Time:   Today, I have spent 10 minutes with the patient with telehealth technology discussing medical history, symptoms, and management plan.    Levi Aland, NP-C  12/17/2022, 1:01 PM [address], [address] Office [phone] Fax [phone]

## 2022-12-17 NOTE — Telephone Encounter (Signed)
I s/w the pt and she has agreed to teke add on today @ 1 pm, she said she could not do any other time today as she is going to have radiation seeds implanted today at 2 pm. I s/w the pt that our office only received the request yesterday. Pt tells that she has known about this surgery x 1 month. I stated that I cannot answer to why we only received request yesterday. Pt also tells me that her surgery is 12/21/22, which request stated TBD. Pt states she does not understand why Dr. Rosezena Sensor office waited so long to get this to her cardiology office and why they said they did not have scheduled yet. Pt was upset by actions by surgeon's office and stated that she has breast cancer and this is not anything to mess with. Med rec and consent are done. I will update all parties involved.

## 2022-12-17 NOTE — Telephone Encounter (Signed)
I s/w the pt and she has agreed to teke add on today @ 1 pm, she said she could not do any other time today as she is going to have radiation seeds implanted today at 2 pm. I s/w the pt that our office only received the request yesterday. Pt tells that she has known about this surgery x 1 month. I stated that I cannot answer to why we only received request yesterday. Pt also tells me that her surgery is 12/21/22, which request stated TBD. Pt states she does not understand why Dr. Rosezena Sensor office waited so long to get this to her cardiology office and why they said they did not have scheduled yet. Pt was upset by actions by surgeon's office and stated that she has breast cancer and this is not anything to mess with. Med rec and consent are done. I will update all parties involved.     Patient Consent for Virtual Visit        Pamela Davenport has provided verbal consent on 12/17/2022 for a virtual visit (video or telephone).   CONSENT FOR VIRTUAL VISIT FOR:  Pamela Davenport  By participating in this virtual visit I agree to the following:  I hereby voluntarily request, consent and authorize [facility] and its employed or contracted physicians, physician assistants, nurse practitioners or other licensed health care professionals (the Practitioner), to provide me with telemedicine health care services (the "Services") as deemed necessary by the treating Practitioner. I acknowledge and consent to receive the Services by the Practitioner via telemedicine. I understand that the telemedicine visit will involve communicating with the Practitioner through live audiovisual communication technology and the disclosure of certain medical information by electronic transmission. I acknowledge that I have been given the opportunity to request an in-person assessment or other available alternative prior to the telemedicine visit and am voluntarily participating in the telemedicine visit.  I understand that I  have the right to withhold or withdraw my consent to the use of telemedicine in the course of my care at any time, without affecting my right to future care or treatment, and that the Practitioner or I may terminate the telemedicine visit at any time. I understand that I have the right to inspect all information obtained and/or recorded in the course of the telemedicine visit and may receive copies of available information for a reasonable fee.  I understand that some of the potential risks of receiving the Services via telemedicine include:  Delay or interruption in medical evaluation due to technological equipment failure or disruption; Information transmitted may not be sufficient (e.g. poor resolution of images) to allow for appropriate medical decision making by the Practitioner; and/or  In rare instances, security protocols could fail, causing a breach of personal health information.  Furthermore, I acknowledge that it is my responsibility to provide information about my medical history, conditions and care that is complete and accurate to the best of my ability. I acknowledge that Practitioner's advice, recommendations, and/or decision may be based on factors not within their control, such as incomplete or inaccurate data provided by me or distortions of diagnostic images or specimens that may result from electronic transmissions. I understand that the practice of medicine is not an exact science and that Practitioner makes no warranties or guarantees regarding treatment outcomes. I acknowledge that a copy of this consent can be made available to me via my patient portal [facility] MyChart), or I can request a printed copy by calling the  office of [facility].    I understand that my insurance will be billed for this visit.   I have read or had this consent read to me. I understand the contents of this consent, which adequately explains the benefits and risks of the Services being  provided via telemedicine.  I have been provided ample opportunity to ask questions regarding this consent and the Services and have had my questions answered to my satisfaction. I give my informed consent for the services to be provided through the use of telemedicine in my medical care

## 2022-12-17 NOTE — Telephone Encounter (Signed)
Patient is returning call.  °

## 2022-12-21 ENCOUNTER — Ambulatory Visit
Admission: RE | Admit: 2022-12-21 | Discharge: 2022-12-21 | Disposition: A | Discharge status: Home / Self Care | Payer: Medicare HMO | Source: Ambulatory Visit | Attending: Surgery | Admitting: Surgery

## 2022-12-21 ENCOUNTER — Other Ambulatory Visit: Payer: Self-pay

## 2022-12-21 ENCOUNTER — Encounter (HOSPITAL_BASED_OUTPATIENT_CLINIC_OR_DEPARTMENT_OTHER)
Admission: RE | Disposition: A | Discharge status: Home / Self Care | Payer: Self-pay | Source: Ambulatory Visit | Attending: Surgery

## 2022-12-21 ENCOUNTER — Ambulatory Visit (HOSPITAL_BASED_OUTPATIENT_CLINIC_OR_DEPARTMENT_OTHER)
Admission: RE | Admit: 2022-12-21 | Discharge: 2022-12-21 | Disposition: A | Discharge status: Home / Self Care | Payer: Medicare HMO | Source: Ambulatory Visit | Attending: Surgery | Admitting: Surgery

## 2022-12-21 ENCOUNTER — Ambulatory Visit
Admission: RE | Admit: 2022-12-21 | Discharge: 2022-12-21 | Disposition: A | Discharge status: Home / Self Care | Payer: Medicare Other | Source: Ambulatory Visit | Attending: Surgery | Admitting: Surgery

## 2022-12-21 ENCOUNTER — Ambulatory Visit (HOSPITAL_BASED_OUTPATIENT_CLINIC_OR_DEPARTMENT_OTHER): Payer: Medicare HMO | Admitting: Anesthesiology

## 2022-12-21 ENCOUNTER — Encounter (HOSPITAL_BASED_OUTPATIENT_CLINIC_OR_DEPARTMENT_OTHER): Payer: Self-pay | Admitting: Surgery

## 2022-12-21 DIAGNOSIS — Z87891 Personal history of nicotine dependence: Secondary | ICD-10-CM

## 2022-12-21 DIAGNOSIS — Z86718 Personal history of other venous thrombosis and embolism: Secondary | ICD-10-CM | POA: Insufficient documentation

## 2022-12-21 DIAGNOSIS — D573 Sickle-cell trait: Secondary | ICD-10-CM | POA: Insufficient documentation

## 2022-12-21 DIAGNOSIS — D759 Disease of blood and blood-forming organs, unspecified: Secondary | ICD-10-CM | POA: Insufficient documentation

## 2022-12-21 DIAGNOSIS — C773 Secondary and unspecified malignant neoplasm of axilla and upper limb lymph nodes: Secondary | ICD-10-CM | POA: Insufficient documentation

## 2022-12-21 DIAGNOSIS — C50912 Malignant neoplasm of unspecified site of left female breast: Secondary | ICD-10-CM

## 2022-12-21 DIAGNOSIS — D630 Anemia in neoplastic disease: Secondary | ICD-10-CM | POA: Diagnosis not present

## 2022-12-21 DIAGNOSIS — G8918 Other acute postprocedural pain: Secondary | ICD-10-CM | POA: Diagnosis not present

## 2022-12-21 DIAGNOSIS — Z86711 Personal history of pulmonary embolism: Secondary | ICD-10-CM | POA: Insufficient documentation

## 2022-12-21 DIAGNOSIS — I4891 Unspecified atrial fibrillation: Secondary | ICD-10-CM | POA: Diagnosis not present

## 2022-12-21 DIAGNOSIS — C50312 Malignant neoplasm of lower-inner quadrant of left female breast: Secondary | ICD-10-CM | POA: Diagnosis not present

## 2022-12-21 DIAGNOSIS — R590 Localized enlarged lymph nodes: Secondary | ICD-10-CM | POA: Diagnosis not present

## 2022-12-21 DIAGNOSIS — Z01818 Encounter for other preprocedural examination: Secondary | ICD-10-CM

## 2022-12-21 DIAGNOSIS — Z170 Estrogen receptor positive status [ER+]: Secondary | ICD-10-CM | POA: Insufficient documentation

## 2022-12-21 DIAGNOSIS — Z7901 Long term (current) use of anticoagulants: Secondary | ICD-10-CM | POA: Insufficient documentation

## 2022-12-21 DIAGNOSIS — D649 Anemia, unspecified: Secondary | ICD-10-CM | POA: Diagnosis not present

## 2022-12-21 DIAGNOSIS — I10 Essential (primary) hypertension: Secondary | ICD-10-CM | POA: Insufficient documentation

## 2022-12-21 DIAGNOSIS — C50812 Malignant neoplasm of overlapping sites of left female breast: Secondary | ICD-10-CM | POA: Insufficient documentation

## 2022-12-21 DIAGNOSIS — Z6841 Body Mass Index (BMI) 40.0 and over, adult: Secondary | ICD-10-CM | POA: Insufficient documentation

## 2022-12-21 DIAGNOSIS — R928 Other abnormal and inconclusive findings on diagnostic imaging of breast: Secondary | ICD-10-CM | POA: Diagnosis not present

## 2022-12-21 HISTORY — DX: Cardiac arrhythmia, unspecified: I49.9

## 2022-12-21 HISTORY — PX: BREAST LUMPECTOMY WITH RADIOACTIVE SEED AND SENTINEL LYMPH NODE BIOPSY: SHX6550

## 2022-12-21 SURGERY — BREAST LUMPECTOMY WITH RADIOACTIVE SEED AND SENTINEL LYMPH NODE BIOPSY
Anesthesia: General | Laterality: Left

## 2022-12-21 MED ORDER — MIDAZOLAM HCL 2 MG/2ML IJ SOLN
INTRAMUSCULAR | Status: AC
  Filled 2022-12-21: qty 2

## 2022-12-21 MED ORDER — KETOROLAC TROMETHAMINE 30 MG/ML IJ SOLN
INTRAMUSCULAR | Status: AC
  Filled 2022-12-21: qty 1

## 2022-12-21 MED ORDER — CHLORHEXIDINE GLUCONATE CLOTH 2 % EX PADS: 6.0000 | MEDICATED_PAD | Freq: Once | CUTANEOUS | Status: DC

## 2022-12-21 MED ORDER — BUPIVACAINE-EPINEPHRINE (PF) 0.25% -1:200000 IJ SOLN
INTRAMUSCULAR | Status: DC | PRN
  Administered 2022-12-21: 26 mL

## 2022-12-21 MED ORDER — MIDAZOLAM HCL 5 MG/5ML IJ SOLN
INTRAMUSCULAR | Status: DC | PRN
  Administered 2022-12-21 (×2): 1 mg via INTRAVENOUS

## 2022-12-21 MED ORDER — PROPOFOL 500 MG/50ML IV EMUL
INTRAVENOUS | Status: DC | PRN
  Administered 2022-12-21: 150 ug/kg/min via INTRAVENOUS

## 2022-12-21 MED ORDER — OXYCODONE HCL 5 MG PO TABS
5.0000 mg | ORAL_TABLET | Freq: Four times a day (QID) | ORAL | 0 refills | Status: DC | PRN
Start: 2022-12-21 — End: 2023-01-27

## 2022-12-21 MED ORDER — ONDANSETRON HCL 4 MG/2ML IJ SOLN
INTRAMUSCULAR | Status: AC
  Filled 2022-12-21: qty 2

## 2022-12-21 MED ORDER — LIDOCAINE 2% (20 MG/ML) 5 ML SYRINGE
INTRAMUSCULAR | Status: AC
  Filled 2022-12-21: qty 5

## 2022-12-21 MED ORDER — OXYCODONE HCL 5 MG/5ML PO SOLN: 5.0000 mg | Freq: Once | ORAL | Status: AC | PRN

## 2022-12-21 MED ORDER — PHENYLEPHRINE HCL (PRESSORS) 10 MG/ML IV SOLN
INTRAVENOUS | Status: DC | PRN
  Administered 2022-12-21 (×5): 80 ug via INTRAVENOUS

## 2022-12-21 MED ORDER — MIDAZOLAM HCL 2 MG/2ML IJ SOLN
2.0000 mg | Freq: Once | INTRAMUSCULAR | Status: AC
  Administered 2022-12-21: 2 mg via INTRAVENOUS

## 2022-12-21 MED ORDER — FENTANYL CITRATE (PF) 100 MCG/2ML IJ SOLN
INTRAMUSCULAR | Status: AC
  Filled 2022-12-21: qty 2

## 2022-12-21 MED ORDER — DEXAMETHASONE SODIUM PHOSPHATE 10 MG/ML IJ SOLN
INTRAMUSCULAR | Status: AC
  Filled 2022-12-21: qty 1

## 2022-12-21 MED ORDER — FENTANYL CITRATE (PF) 100 MCG/2ML IJ SOLN
100.0000 ug | Freq: Once | INTRAMUSCULAR | Status: AC
  Administered 2022-12-21: 100 ug via INTRAVENOUS

## 2022-12-21 MED ORDER — LIDOCAINE HCL (CARDIAC) PF 100 MG/5ML IV SOSY
PREFILLED_SYRINGE | INTRAVENOUS | Status: DC | PRN
  Administered 2022-12-21: 50 mg via INTRAVENOUS

## 2022-12-21 MED ORDER — CEFAZOLIN IN SODIUM CHLORIDE 3-0.9 GM/100ML-% IV SOLN
3.0000 g | INTRAVENOUS | Status: AC
  Administered 2022-12-21: 3 g via INTRAVENOUS

## 2022-12-21 MED ORDER — DEXAMETHASONE SODIUM PHOSPHATE 4 MG/ML IJ SOLN
INTRAMUSCULAR | Status: DC | PRN
  Administered 2022-12-21: 8 mg via INTRAVENOUS

## 2022-12-21 MED ORDER — SODIUM CHLORIDE 0.9 % IV SOLN
INTRAVENOUS | Status: DC | PRN
  Administered 2022-12-21: 500 mL

## 2022-12-21 MED ORDER — MAGTRACE LYMPHATIC TRACER
INTRAMUSCULAR | Status: DC | PRN
  Administered 2022-12-21: 2 mL via INTRAMUSCULAR

## 2022-12-21 MED ORDER — LACTATED RINGERS IV SOLN: INTRAVENOUS | Status: DC

## 2022-12-21 MED ORDER — ONDANSETRON HCL 4 MG/2ML IJ SOLN: 4.0000 mg | Freq: Once | INTRAMUSCULAR | Status: DC | PRN

## 2022-12-21 MED ORDER — PROPOFOL 10 MG/ML IV BOLUS
INTRAVENOUS | Status: DC | PRN
  Administered 2022-12-21: 180 mg via INTRAVENOUS

## 2022-12-21 MED ORDER — FENTANYL CITRATE (PF) 100 MCG/2ML IJ SOLN
25.0000 ug | INTRAMUSCULAR | Status: DC | PRN
  Administered 2022-12-21 (×2): 50 ug via INTRAVENOUS

## 2022-12-21 MED ORDER — BUPIVACAINE LIPOSOME 1.3 % IJ SUSP
INTRAMUSCULAR | Status: DC | PRN
  Administered 2022-12-21: 10 mL via PERINEURAL

## 2022-12-21 MED ORDER — EPHEDRINE 5 MG/ML INJ
INTRAVENOUS | Status: AC
  Filled 2022-12-21: qty 5

## 2022-12-21 MED ORDER — ONDANSETRON HCL 4 MG/2ML IJ SOLN
INTRAMUSCULAR | Status: DC | PRN
  Administered 2022-12-21: 4 mg via INTRAVENOUS

## 2022-12-21 MED ORDER — OXYCODONE HCL 5 MG PO TABS
ORAL_TABLET | ORAL | Status: AC
  Filled 2022-12-21: qty 1

## 2022-12-21 MED ORDER — PROPOFOL 10 MG/ML IV BOLUS
INTRAVENOUS | Status: AC
  Filled 2022-12-21: qty 20

## 2022-12-21 MED ORDER — BUPIVACAINE HCL (PF) 0.5 % IJ SOLN
INTRAMUSCULAR | Status: DC | PRN
  Administered 2022-12-21: 20 mL via PERINEURAL

## 2022-12-21 MED ORDER — FENTANYL CITRATE (PF) 100 MCG/2ML IJ SOLN
INTRAMUSCULAR | Status: DC | PRN
  Administered 2022-12-21: 25 ug via INTRAVENOUS
  Administered 2022-12-21: 50 ug via INTRAVENOUS
  Administered 2022-12-21: 25 ug via INTRAVENOUS

## 2022-12-21 MED ORDER — OXYCODONE HCL 5 MG PO TABS
5.0000 mg | ORAL_TABLET | Freq: Once | ORAL | Status: AC | PRN
  Administered 2022-12-21: 5 mg via ORAL

## 2022-12-21 MED ORDER — EPHEDRINE SULFATE (PRESSORS) 50 MG/ML IJ SOLN
INTRAMUSCULAR | Status: DC | PRN
  Administered 2022-12-21: 10 mg via INTRAVENOUS

## 2022-12-21 MED ORDER — CEFAZOLIN IN SODIUM CHLORIDE 3-0.9 GM/100ML-% IV SOLN
INTRAVENOUS | Status: AC
  Filled 2022-12-21: qty 100

## 2022-12-21 MED ORDER — PHENYLEPHRINE 80 MCG/ML (10ML) SYRINGE FOR IV PUSH (FOR BLOOD PRESSURE SUPPORT)
PREFILLED_SYRINGE | INTRAVENOUS | Status: AC
  Filled 2022-12-21: qty 10

## 2022-12-21 SURGICAL SUPPLY — 49 items
ADH SKN CLS APL DERMABOND .7 (GAUZE/BANDAGES/DRESSINGS) ×1
APL PRP STRL LF DISP 70% ISPRP (MISCELLANEOUS) ×1
APPLIER CLIP 9.375 MED OPEN (MISCELLANEOUS) ×1
APR CLP MED 9.3 20 MLT OPN (MISCELLANEOUS) ×1
BINDER BREAST LRG (GAUZE/BANDAGES/DRESSINGS) IMPLANT
BINDER BREAST MEDIUM (GAUZE/BANDAGES/DRESSINGS) IMPLANT
BINDER BREAST XLRG (GAUZE/BANDAGES/DRESSINGS) IMPLANT
BINDER BREAST XXLRG (GAUZE/BANDAGES/DRESSINGS) IMPLANT
BLADE SURG 15 STRL LF DISP TIS (BLADE) ×2 IMPLANT
BLADE SURG 15 STRL SS (BLADE) ×1
CANISTER SUC SOCK COL 7IN (MISCELLANEOUS) IMPLANT
CANISTER SUCT 1200ML W/VALVE (MISCELLANEOUS) ×2 IMPLANT
CHLORAPREP W/TINT 26 (MISCELLANEOUS) ×2 IMPLANT
CLIP APPLIE 9.375 MED OPEN (MISCELLANEOUS) ×2 IMPLANT
COVER BACK TABLE 60X90IN (DRAPES) ×2 IMPLANT
COVER MAYO STAND STRL (DRAPES) ×2 IMPLANT
COVER PROBE CYLINDRICAL 5X96 (MISCELLANEOUS) ×2 IMPLANT
DERMABOND ADVANCED .7 DNX12 (GAUZE/BANDAGES/DRESSINGS) ×2 IMPLANT
DRAPE LAPAROSCOPIC ABDOMINAL (DRAPES) ×2 IMPLANT
DRAPE UTILITY XL STRL (DRAPES) ×2 IMPLANT
ELECT COATED BLADE 2.86 ST (ELECTRODE) ×2 IMPLANT
ELECT REM PT RETURN 9FT ADLT (ELECTROSURGICAL) ×1
ELECTRODE REM PT RTRN 9FT ADLT (ELECTROSURGICAL) ×2 IMPLANT
GLOVE BIOGEL PI IND STRL 8 (GLOVE) ×2 IMPLANT
GLOVE ECLIPSE 8.0 STRL XLNG CF (GLOVE) ×2 IMPLANT
GOWN STRL REUS W/ TWL LRG LVL3 (GOWN DISPOSABLE) ×4 IMPLANT
GOWN STRL REUS W/ TWL XL LVL3 (GOWN DISPOSABLE) ×2 IMPLANT
GOWN STRL REUS W/TWL LRG LVL3 (GOWN DISPOSABLE) ×2
GOWN STRL REUS W/TWL XL LVL3 (GOWN DISPOSABLE) ×1
HEMOSTAT ARISTA ABSORB 3G PWDR (HEMOSTASIS) IMPLANT
HEMOSTAT SNOW SURGICEL 2X4 (HEMOSTASIS) IMPLANT
KIT MARKER MARGIN INK (KITS) ×2 IMPLANT
NDL HYPO 25X1 1.5 SAFETY (NEEDLE) ×2 IMPLANT
NDL SAFETY ECLIP 18X1.5 (MISCELLANEOUS) IMPLANT
NEEDLE HYPO 25X1 1.5 SAFETY (NEEDLE) ×1 IMPLANT
NS IRRIG 1000ML POUR BTL (IV SOLUTION) ×2 IMPLANT
PACK BASIN DAY SURGERY FS (CUSTOM PROCEDURE TRAY) ×2 IMPLANT
PENCIL SMOKE EVACUATOR (MISCELLANEOUS) ×2 IMPLANT
SLEEVE SCD COMPRESS KNEE MED (STOCKING) ×2 IMPLANT
SPIKE FLUID TRANSFER (MISCELLANEOUS) IMPLANT
SPONGE T-LAP 4X18 ~~LOC~~+RFID (SPONGE) ×2 IMPLANT
SUT MNCRL AB 4-0 PS2 18 (SUTURE) ×2 IMPLANT
SUT VICRYL 3-0 CR8 SH (SUTURE) ×2 IMPLANT
SYR CONTROL 10ML LL (SYRINGE) ×2 IMPLANT
TOWEL GREEN STERILE FF (TOWEL DISPOSABLE) ×2 IMPLANT
TRACER MAGTRACE VIAL (MISCELLANEOUS) IMPLANT
TRAY FAXITRON CT DISP (TRAY / TRAY PROCEDURE) ×2 IMPLANT
TUBE CONNECTING 20X1/4 (TUBING) ×2 IMPLANT
YANKAUER SUCT BULB TIP NO VENT (SUCTIONS) ×2 IMPLANT

## 2022-12-21 NOTE — Progress Notes (Signed)
Assisted Dr. Foster with left, pectoralis, ultrasound guided block. Side rails up, monitors on throughout procedure. See vital signs in flow sheet. Tolerated Procedure well. 

## 2022-12-21 NOTE — Op Note (Signed)
Preoperative diagnosis: Stage II left breast cancer lower inner quadrant ER positive  Postoperative diagnosis: Same\  Procedure: Left breast seed lumpectomy using a brachial approach and targeted left axillary seed localized lymph node biopsy to deep extra lymph node and deep left axillary sentinel node mapping using mag trace Surgeon: Harriette Bouillon, MD  Anesthesia: LMA with left pectoral block and local anesthetic with 0.25% Marcaine plain  EBL: 10 cc  Specimen: Left breast tissue with 2 cc and 2 clips and grossly negative margin, left deep axillary lymph node with seed and clip, 3 additional left axillary sentinel lymph nodes deep axilla hot  Drains: None  Indications for procedure: The patient is a 67 year old female with stage II left breast cancer.  She is opted for breast conserving surgery.  She has seen medical and radiation oncology and reviewed all of her surgical options.The procedure has been discussed with the patient. Alternatives to surgery have been discussed with the patient.  Risks of surgery include bleeding,  Infection,  Seroma formation, death,  and the need for further surgery.   The patient understands and wishes to proceed. Sentinel lymph node mapping and dissection has been discussed with the patient.  Risk of bleeding,  Infection,  Seroma formation,  Additional procedures,,  Shoulder weakness ,  Shoulder stiffness,  Nerve and blood vessel injury and reaction to the mapping dyes have been discussed.  Alternatives to surgery have been discussed with the patient.  The patient agrees to proceed.     Description of procedure: The patient was met in the holding area and questions were answered.  Left breast underwent pectoral block (her protocol.  She underwent seed placement of 3 seeds as an outpatient with the radiologist.  To order around the breast tissue in the medial left breast and the last was in the noted in the deep axilla.  All questions were answered.  She was  taken back to the operating room.  She is placed upon the OR table.  After induction of general esthesia, left breast was prepped and then 2 cc of mag trace were injected in a subareolar position.  Massage for 5 minutes ensued.  The left breast was then prepped and draped formally in a sterile fashion and a second timeout performed.  Neoprobe used identify both seeds in the left medial inner quadrant.  Transverse incision was made over the area.  All tissue around both seeds and both clips were excised with grossly negative margins.  Tissue was oriented with ink.  Imaging revealed both seeds and both clips to be present and this was passed off the field.  The cavity is irrigated.  Local anesthetic infiltrated throughout and deep tissue planes closed with 3-0 Vicryl.  4-0 Monocryl used to close the skin in a subcuticular fashion.  Mag trace probe was used.  Hotspots were identified left axilla.  The neoprobe was also used to identify the left axillary lymph node that had the clip and seed in it.  Incision was made over the signal first.  Dissection was carried down into the deep left axilla.  The node with a syncopal identified and removed.  Of note this was also a sentinel node when the mag trace probe was used.  We then used a mag trace probe identified 3 additional sentinel nodes.  These were all removed and sent separately.  Background counts showed no significant spike.  Hemostasis achieved with cautery.  Arista was used for some mild oozing and hemostasis was achieved at  this point.  Local anesthetic was demonstrated throughout.  Deep tissue planes closed with 3-0 Vicryl.  4 Monocryl used to close the skin in a subcuticular fashion.  Dermabond applied.  All counts found to be correct.  Breast binder placed.  Patient was then awoke extubated taken recovery in satisfactory condition.

## 2022-12-21 NOTE — Interval H&P Note (Signed)
History and Physical Interval Note:  12/21/2022 9:07 AM  Pamela Davenport  has presented today for surgery, with the diagnosis of LEFT BREAST CANCER.  The various methods of treatment have been discussed with the patient and family. After consideration of risks, benefits and other options for treatment, the patient has consented to  Procedure(s) with comments: LEFT BREAST BRACKETED SEED LUMPECTOMY AND LEFT SENTINEL LYMPH NODE MAPPING (Left) - GEN & PEC BLOCK as a surgical intervention.  The patient's history has been reviewed, patient examined, no change in status, stable for surgery.  I have reviewed the patient's chart and labs.  Questions were answered to the patient's satisfaction.     Zeth Buday A Connelly Spruell

## 2022-12-21 NOTE — Anesthesia Preprocedure Evaluation (Signed)
Anesthesia Evaluation  Patient identified by MRN, date of birth, ID band Patient awake    Reviewed: Allergy & Precautions, NPO status , Patient's Chart, lab work & pertinent test results, reviewed documented beta blocker date and time   History of Anesthesia Complications (+) PONV, Family history of anesthesia reaction and history of anesthetic complications  Airway Mallampati: II  TM Distance: >3 FB Neck ROM: Full    Dental  (+) Teeth Intact, Missing, Dental Advisory Given   Pulmonary pneumonia, resolved, former smoker, PE   Pulmonary exam normal breath sounds clear to auscultation       Cardiovascular hypertension, Pt. on medications + DVT  + dysrhythmias Atrial Fibrillation + Valvular Problems/Murmurs  Rhythm:Irregular Rate:Normal  EKG 05/04/22 NSR, 1st Deg AV block  Echo 08/03/21 1. Prominent LV apical band. Left ventricular ejection fraction, by  estimation, is 55 to 60%. The left ventricle has normal function. The left  ventricle has no regional wall motion abnormalities. There is mild left  ventricular hypertrophy. Left  ventricular diastolic parameters were normal.   2. Right ventricular systolic function is normal. The right ventricular  size is normal. There is normal pulmonary artery systolic pressure.   3. The mitral valve is abnormal. Mild mitral valve regurgitation. No  evidence of mitral stenosis.   4. The aortic valve is normal in structure. Aortic valve regurgitation is  not visualized. No aortic stenosis is present.   5. The inferior vena cava is normal in size with greater than 50%  respiratory variability, suggesting right atrial pressure of 3 mmHg.     Neuro/Psych negative neurological ROS  negative psych ROS   GI/Hepatic Neg liver ROS,GERD  Medicated,,  Endo/Other    Morbid obesityLeft breast Ca  Renal/GU negative Renal ROS  negative genitourinary   Musculoskeletal  (+) Arthritis , Rheumatoid  disorders,    Abdominal  (+) + obese  Peds  Hematology  (+) Blood dyscrasia, Sickle cell trait and anemia Xarelto therapy- last dose 4/20   Anesthesia Other Findings   Reproductive/Obstetrics Hx/o uterine artery embolizatin                              Anesthesia Physical Anesthesia Plan  ASA: 3  Anesthesia Plan: General   Post-op Pain Management: Regional block* and Minimal or no pain anticipated   Induction: Intravenous  PONV Risk Score and Plan: 4 or greater and Treatment may vary due to age or medical condition, Ondansetron, TIVA and Propofol infusion  Airway Management Planned: LMA  Additional Equipment: None  Intra-op Plan:   Post-operative Plan: Extubation in OR  Informed Consent: I have reviewed the patients History and Physical, chart, labs and discussed the procedure including the risks, benefits and alternatives for the proposed anesthesia with the patient or authorized representative who has indicated his/her understanding and acceptance.     Dental advisory given  Plan Discussed with: CRNA and Anesthesiologist  Anesthesia Plan Comments:          Anesthesia Quick Evaluation

## 2022-12-21 NOTE — Discharge Instructions (Addendum)
Post Anesthesia Home Care Instructions  Activity: Get plenty of rest for the remainder of the day. A responsible individual must stay with you for 24 hours following the procedure.  For the next 24 hours, DO NOT: -Drive a car -Advertising copywriter -Drink alcoholic beverages -Take any medication unless instructed by your physician -Make any legal decisions or sign important papers.  Meals: Start with liquid foods such as gelatin or soup. Progress to regular foods as tolerated. Avoid greasy, spicy, heavy foods. If nausea and/or vomiting occur, drink only clear liquids until the nausea and/or vomiting subsides. Call your physician if vomiting continues.  Special Instructions/Symptoms: Your throat may feel dry or sore from the anesthesia or the breathing tube placed in your throat during surgery. If this causes discomfort, gargle with warm salt water. The discomfort should disappear within 24 hours.  If you had a scopolamine patch placed behind your ear for the management of post- operative nausea and/or vomiting:  1. The medication in the patch is effective for 72 hours, after which it should be removed.  Wrap patch in a tissue and discard in the trash. Wash hands thoroughly with soap and water. 2. You may remove the patch earlier than 72 hours if you experience unpleasant side effects which may include dry mouth, dizziness or visual disturbances. 3. Avoid touching the patch. Wash your hands with soap and water after contact with the patch.    Information for Discharge Teaching: EXPAREL (bupivacaine liposome injectable suspension)   Your surgeon or anesthesiologist gave you EXPAREL(bupivacaine) to help control your pain after surgery.  EXPAREL is a local anesthetic that provides pain relief by numbing the tissue around the surgical site. EXPAREL is designed to release pain medication over time and can control pain for up to 72 hours. Depending on how you respond to EXPAREL, you may  require less pain medication during your recovery.  Possible side effects: Temporary loss of sensation or ability to move in the area where bupivacaine was injected. Nausea, vomiting, constipation Rarely, numbness and tingling in your mouth or lips, lightheadedness, or anxiety may occur. Call your doctor right away if you think you may be experiencing any of these sensations, or if you have other questions regarding possible side effects.  Follow all other discharge instructions given to you by your surgeon or nurse. Eat a healthy diet and drink plenty of water or other fluids.  If you return to the hospital for any reason within 96 hours following the administration of EXPAREL, it is important for health care providers to know that you have received this anesthetic. A teal colored band has been placed on your arm with the date, time and amount of EXPAREL you have received in order to alert and inform your health care providers. Please leave this armband in place for the full 96 hours following administration, and then you may remove the band.   [facility] Office Phone Number [phone]  BREAST BIOPSY/ PARTIAL MASTECTOMY: POST OP INSTRUCTIONS  Always review your discharge instruction sheet given to you by the facility where your surgery was performed.  IF YOU HAVE DISABILITY OR FAMILY LEAVE FORMS, YOU MUST BRING THEM TO THE OFFICE FOR PROCESSING.  DO NOT GIVE THEM TO YOUR DOCTOR.  A prescription for pain medication may be given to you upon discharge.  Take your pain medication as prescribed, if needed.  If narcotic pain medicine is not needed, then you may take acetaminophen (Tylenol) or ibuprofen (Advil) as needed.  Take your usually prescribed medications unless otherwise directed If you need a refill on your pain medication, please contact your pharmacy.  They will contact our office to request authorization.  Prescriptions will not be filled after 5pm or on  week-ends. You should eat very light the first 24 hours after surgery, such as soup, crackers, pudding, etc.  Resume your normal diet the day after surgery. Most patients will experience some swelling and bruising in the breast.  Ice packs and a good support bra will help.  Swelling and bruising can take several days to resolve.  It is common to experience some constipation if taking pain medication after surgery.  Increasing fluid intake and taking a stool softener will usually help or prevent this problem from occurring.  A mild laxative (Milk of Magnesia or Miralax) should be taken according to package directions if there are no bowel movements after 48 hours. Unless discharge instructions indicate otherwise, you may remove your bandages 24-48 hours after surgery, and you may shower at that time.  You may have steri-strips (small skin tapes) in place directly over the incision.  These strips should be left on the skin for 7-10 days.  If your surgeon used skin glue on the incision, you may shower in 24 hours.  The glue will flake off over the next 2-3 weeks.  Any sutures or staples will be removed at the office during your follow-up visit. ACTIVITIES:  You may resume regular daily activities (gradually increasing) beginning the next day.  Wearing a good support bra or sports bra minimizes pain and swelling.  You may have sexual intercourse when it is comfortable. You may drive when you no longer are taking prescription pain medication, you can comfortably wear a seatbelt, and you can safely maneuver your car and apply brakes. RETURN TO WORK:  ______________________________________________________________________________________ Bonita Quin should see your doctor in the office for a follow-up appointment approximately two weeks after your surgery.  Your doctor's nurse will typically make your follow-up appointment when she calls you with your pathology report.  Expect your pathology report 2-3 business days after  your surgery.  You may call to check if you do not hear from [place] after three days. OTHER INSTRUCTIONS: _______________________________________________________________________________________________ _____________________________________________________________________________________________________________________________________ _____________________________________________________________________________________________________________________________________ _____________________________________________________________________________________________________________________________________  WHEN TO CALL YOUR DOCTOR: Fever over 101.0 Nausea and/or vomiting. Extreme swelling or bruising. Continued bleeding from incision. Increased pain, redness, or drainage from the incision.  The clinic staff is available to answer your questions during regular business hours.  Please don't hesitate to call and ask to speak to one of the nurses for clinical concerns.  If you have a medical emergency, go to the nearest emergency room or call 911.  A surgeon from [facility] is always on call at the hospital.  For further questions, please visit [email or web address]

## 2022-12-21 NOTE — H&P (Signed)
Chief Complaint: Breast Cancer  History of Present Illness: Pamela Davenport is a 67 y.o. female who is seen today as an office consultation for evaluation of Breast Cancer  Patient presents in the [facility] for evaluation of newly diagnosed left breast cancer. She had 2 masses left breast lower outer quadrant core biopsy proven to be consistent with invasive ductal carcinoma ER positive PR positive HER2/neu negative. She is to follow no masses 8:00 in the left breast 1 measures 1 cm maximal diameter the other is 0.7. She also had multiple abnormal appearing lymph nodes. Lymph node biopsy was negative but felt to be discordant. No history of breast mass nipple discharge or breast pain.  Review of Systems: A complete review of systems was obtained from the patient. I have reviewed this information and discussed as appropriate with the patient. See HPI as well for other ROS.    Medical History: Past Medical History:  Diagnosis Date  Arthritis  DVT (deep venous thrombosis) (CMS-HCC)  Hypertension   Patient Active Problem List  Diagnosis  Malignant neoplasm of lower-inner quadrant of left breast in female, estrogen receptor positive  Essential hypertension   Past Surgical History:  Procedure Laterality Date  ABDOMINAL HYSTERECTOMY  BREAST EXCISIONAL BIOPSY  TONSILLECTOMY  UNLISTED PROCEDURE VASCULAR SURGERY    Allergies  Allergen Reactions  Macrobid [Nitrofurantoin Monohyd/M-Cryst] Unknown  Methylprednisolone Unknown  Sulfa (Sulfonamide Antibiotics) Hives   No current outpatient medications on file prior to visit.   No current facility-administered medications on file prior to visit.   Family History  Problem Relation Age of Onset  High blood pressure (Hypertension) Mother  Diabetes Mother    Social History   Tobacco Use  Smoking Status Former  Types: Cigarettes  Smokeless Tobacco Never    Social History   Socioeconomic History  Marital status: Single  Tobacco Use   Smoking status: Former  Types: Cigarettes  Smokeless tobacco: Never  Vaping Use  Vaping Use: Never used  Substance and Sexual Activity  Alcohol use: Not Currently  Drug use: Never   Objective:  There were no vitals filed for this visit.  There is no height or weight on file to calculate BMI.  Physical Exam Exam conducted with a chaperone present.  HENT:  Head: Normocephalic.  Cardiovascular:  Rate and Rhythm: Normal rate.  Pulmonary:  Effort: Pulmonary effort is normal.  Chest:  Breasts: Right: Normal. No mass or nipple discharge.  Left: No mass or nipple discharge.   Comments: Bruising left breast noted. Musculoskeletal:  General: Normal range of motion.  Cervical back: Normal range of motion.  Lymphadenopathy:  Upper Body:  Right upper body: No axillary or pectoral adenopathy.  Left upper body: No axillary or pectoral adenopathy.  Skin: General: Skin is warm.  Neurological:  General: No focal deficit present.  Mental Status: She is alert.  Psychiatric:  Mood and Affect: Mood normal.  Behavior: Behavior normal.     Labs, Imaging and Diagnostic Testing: CLINICAL DATA: 67 year old female presenting for follow-up of 2 likely benign left breast masses. Of note, the patient has rheumatoid arthritis.  EXAM: DIGITAL DIAGNOSTIC BILATERAL MAMMOGRAM WITH TOMOSYNTHESIS; ULTRASOUND LEFT BREAST LIMITED  TECHNIQUE: Bilateral digital diagnostic mammography and breast tomosynthesis was performed.; Targeted ultrasound examination of the left breast was performed.  COMPARISON: Previous exam(s).  ACR Breast Density Category b: There are scattered areas of fibroglandular density.  FINDINGS: The mass in the lower-inner quadrant of the left breast, likely corresponding with the mass on ultrasound at 8 o'clock appears  slightly more prominent with possible associated distortion. The mass further posterior in the medial left breast is not clearly visualized on today's  exam. No new suspicious calcifications, masses or areas of distortion are seen in the bilateral breasts.  Ultrasound targeted to the left breast at 8 o'clock, 1 cm from the nipple demonstrates an irregular hypoechoic mass measuring 1.1 x 0.7 x 1.0 cm, previously measuring 0.8 x 0.5 x 0.7 mm in March of 2023.  Ultrasound targeted to the left breast at 8 o'clock, 5 cm from the nipple demonstrates a slightly larger hypoechoic oval mass measuring 0.7 x 0.4 x 0.6 cm, previously 0.5 x 0.3 x 0.4 cm.  Ultrasound of the left axilla demonstrates several mildly prominent left axillary lymph nodes with cortices measuring up to 5 mm.  IMPRESSION: 1. Mild interval increase in size of the 2 masses in the left breast at 8 o'clock.  2. Multiple prominent left axillary lymph nodes.  3. No evidence of right breast malignancy.  RECOMMENDATION: Ultrasound guided biopsy is recommended for the 2 masses in the left breast and the largest of the left axillary lymph nodes.  I have discussed the findings and recommendations with the patient. If applicable, a reminder letter will be sent to the patient regarding the next appointment.  BI-RADS CATEGORY 4: Suspicious.   Electronically Signed By: Frederico Hamman M.D. On: 10/25/2022 12:22 Diagnosis 1. Breast, left, needle core biopsy, 8 o'clock 1 cm (ribbon clip) INVASIVE DUCTAL CARCINOMA, SEE NOTE TUBULE FORMATION: SCORE 2 NUCLEAR PLEOMORPHISM: SCORE 2 MITOTIC COUNT: SCORE 1 TOTAL SCORE: 5 OVERALL GRADE: 1 LYMPHOVASCULAR INVASION: NOT IDENTIFIED CANCER LENGTH: 0.8 CM CALCIFICATIONS: NOT IDENTIFIED OTHER FINDINGS: NONE SEE NOTE NOTE: DR. PICKLESIMER REVIEWED THE CASE AND CONCURS WITH THE INTERPRETATION. A BREAST PROGNOSTIC PROFILE (ER, PR, KI-67 AND HER2) IS PENDING AND WILL BE REPORTED IN AN ADDENDUM. [facility] [place] WAS NOTIFIED ON 11/09/2022. 2. Breast, left, needle core biopsy, 8 o'clock 5 cm (heart clip) INVASIVE DUCTAL  CARCINOMA, SEE NOTE TUBULE FORMATION: SCORE 2 NUCLEAR PLEOMORPHISM: SCORE 2 MITOTIC COUNT: SCORE 1 TOTAL SCORE: 5 OVERALL GRADE: 1 LYMPHOVASCULAR INVASION: NOT IDENTIFIED CANCER LENGTH: 0.5 CM CALCIFICATIONS: NOT IDENTIFIED OTHER FINDINGS: NONE 1 of 5 FINAL for ASHLEAH, VALTIERRA [phone]) Diagnosis(continued) SEE NOTE NOTE: DR. PICKLESIMER REVIEWED THE CASE AND CONCURS WITH THE INTERPRETATION. A BREAST PROGNOSTIC PROFILE (ER, PR, KI-67 AND HER2) IS PENDING AND WILL BE REPORTED IN AN ADDENDUM. [facility] [place] WAS NOTIFIED ON 11/09/2022. 3. Lymph node, needle/core biopsy, left axilla ONE LYMPH NODE, NEGATIVE FOR CARCINOMA (0/1). Isac Caddy MD [facility] Pathologist, Electronic Signature (Case signed 11/09/2022) Assessment and Plan:   Diagnoses and all orders for this visit:  Malignant neoplasm of lower-inner quadrant of left breast in female, estrogen receptor positive    Discussed breast conservation versus mastectomy with reconstruction. Discussed lymph node mapping as well as targeted lymph node biopsy due to abnormal left axillary lymph node. Risks and benefits of surgical approaches discussed. Local regional recurrence, long-term survival and quality of life issues discussed with the patient as well. Reconstruction also discussed in light of mastectomy. She is opted for left breast seed localized ectomy and a bracketed approach as well as targeted left axillary lymph node biopsy and sentinel lymph node mapping. Risk of bleeding, infection, lymphedema, poor wound healing, injury to major blood vessel, nerve, neighboring structures, additional excisional surgery, cardiovascular risk anesthesia risk reviewed with patient today.   Hayden Rasmussen, MD

## 2022-12-21 NOTE — Anesthesia Postprocedure Evaluation (Signed)
Anesthesia Post Note  Patient: Pamela Davenport  Procedure(s) Performed: LEFT BREAST BRACKETED SEED LUMPECTOMY AND LEFT SENTINEL LYMPH NODE MAPPING (Left)     Patient location during evaluation: PACU Anesthesia Type: General Level of consciousness: awake and alert and oriented Pain management: pain level controlled Vital Signs Assessment: post-procedure vital signs reviewed and stable Respiratory status: spontaneous breathing, nonlabored ventilation and respiratory function stable Cardiovascular status: blood pressure returned to baseline and stable Postop Assessment: no apparent nausea or vomiting Anesthetic complications: no   No notable events documented.  Last Vitals:  Vitals:   12/21/22 1115 12/21/22 1130  BP: 122/81 (!) 133/57  Pulse: (!) 50 (!) 48  Resp: 17 14  Temp:    SpO2: 99% 100%    Last Pain:  Vitals:   12/21/22 1130  TempSrc:   PainSc: 5                  Shronda Boeh A.

## 2022-12-21 NOTE — Transfer of Care (Signed)
Immediate Anesthesia Transfer of Care Note  Patient: Pamela Davenport  Procedure(s) Performed: LEFT BREAST BRACKETED SEED LUMPECTOMY AND LEFT SENTINEL LYMPH NODE MAPPING (Left)  Patient Location: PACU  Anesthesia Type:General  Level of Consciousness: awake, alert , and oriented  Airway & Oxygen Therapy: Patient Spontanous Breathing and Patient connected to face mask oxygen  Post-op Assessment: Report given to RN and Post -op Vital signs reviewed and stable  Post vital signs: Reviewed and stable  Last Vitals:  Vitals Value Taken Time  BP 111/62 12/21/22 1058  Temp 36.1 C 12/21/22 1058  Pulse 78 12/21/22 1100  Resp 12 12/21/22 1100  SpO2 99 % 12/21/22 1100  Vitals shown include unvalidated device data.  Last Pain:  Vitals:   12/21/22 0737  TempSrc: Oral  PainSc: 0-No pain         Complications: No notable events documented.

## 2022-12-21 NOTE — Anesthesia Procedure Notes (Signed)
Anesthesia Regional Block: Pectoralis block   Pre-Anesthetic Checklist: , timeout performed,  Correct Patient, Correct Site, Correct Laterality,  Correct Procedure, Correct Position, site marked,  Risks and benefits discussed,  Surgical consent,  Pre-op evaluation,  At surgeon's request and post-op pain management  Laterality: Left  Prep: chloraprep       Needles:  Injection technique: Single-shot  Needle Type: Echogenic Stimulator Needle     Needle Length: 10cm  Needle Gauge: 21   Needle insertion depth: 7 cm   Additional Needles:   Procedures:,,,, ultrasound used (permanent image in chart),,    Narrative:  Start time: 12/21/2022 9:12 AM End time: 12/21/2022 9:17 AM Injection made incrementally with aspirations every 5 mL.  Performed by: Personally  Anesthesiologist: Mal Amabile, MD  Additional Notes: Timeout performed. Patient sedated. Relevant anatomy ID'd using [place]. Incremental 2-17ml injection of LA with frequent aspiration. Patient tolerated procedure well.

## 2022-12-21 NOTE — Anesthesia Procedure Notes (Signed)
Procedure Name: LMA Insertion Date/Time: 12/21/2022 9:37 AM  Performed by: Cleda Clarks, CRNAPre-anesthesia Checklist: Patient identified, Emergency Drugs available, Suction available and Patient being monitored Patient Re-evaluated:Patient Re-evaluated prior to induction Oxygen Delivery Method: Circle system utilized Preoxygenation: Pre-oxygenation with 100% oxygen Induction Type: IV induction Ventilation: Mask ventilation without difficulty LMA: LMA inserted LMA Size: 4.0 Number of attempts: 1 Placement Confirmation: positive ETCO2 Tube secured with: Tape Dental Injury: Teeth and Oropharynx as per pre-operative assessment

## 2022-12-22 ENCOUNTER — Encounter (HOSPITAL_BASED_OUTPATIENT_CLINIC_OR_DEPARTMENT_OTHER): Payer: Self-pay | Admitting: Surgery

## 2022-12-23 DIAGNOSIS — C50912 Malignant neoplasm of unspecified site of left female breast: Secondary | ICD-10-CM | POA: Diagnosis not present

## 2022-12-27 LAB — SURGICAL PATHOLOGY

## 2022-12-28 ENCOUNTER — Encounter: Payer: Self-pay | Admitting: Surgery

## 2022-12-29 ENCOUNTER — Encounter: Payer: Self-pay | Admitting: General Practice

## 2022-12-29 NOTE — Progress Notes (Signed)
[facility] Spiritual Care Note  Followed up postoperatively by phone for continued spiritual/emotional support. Pamela Davenport was very appreciative of call and is processing the news that two of her lymph nodes were positive. 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  Provided empathic listening, normalization of feelings, and emotional support.   We plan to follow up by phone ca 5/13 to provide opportunity to process post-op appointment with Dr Pamelia Hoit and any treatment plan changes that might take place then.   [address] Rush Barer, [place], [facility] Pager [phone] Voicemail [phone]

## 2022-12-30 NOTE — Progress Notes (Deleted)
  Electrophysiology Office Follow up Visit Note:    Date:  12/30/2022   ID:  Pamela Davenport, DOB 03-21-1956, MRN [id number]  PCP:  [name], MD  [facility] Cardiologist:  Lanier Prude, MD  [facility] Electrophysiologist:  Hillis Range, MD (Inactive)    Interval History:    Pamela Davenport is a 67 y.o. female who presents for a follow up visit.   Last seen 06/21/2022 by Edd Fabian, NP.  She has a history of DVT (recurrent), PE, sickle cell trait, pAF since age 72.   Was seen by [facility] 04/07/2022. Had previously seen JA. Was on PRN flecainide.       Past medical, surgical, social and family history were reviewed.  ROS:   Please see the history of present illness.    All other systems reviewed and are negative.  EKGs/Labs/Other Studies Reviewed:    The following studies were reviewed today:  EKG:  The ekg ordered today demonstrates ***   Physical Exam:    VS:  There were no vitals taken for this visit.    Wt Readings from Last 3 Encounters:  12/21/22 276 lb 3.8 oz (125.3 kg)  11/17/22 281 lb 6.4 oz (127.6 kg)  11/04/22 281 lb 12.8 oz (127.8 kg)     GEN: *** Well nourished, well developed in no acute distress CARDIAC: ***RRR, no murmurs, rubs, gallops RESPIRATORY:  Clear to auscultation without rales, wheezing or rhonchi       ASSESSMENT:    No diagnosis found. PLAN:    In order of problems listed above:  #pAF #high risk med monitoring - flecainide Minimal burden of AF on flec. ON xarelto for stroke ppx. PR and QRS duration acceptable for continued flecainide use. Continue nadolol.  #PVC's Cont flec and nadolol.  #Hypertension *** goal today.  Recommend checking blood pressures 1-2 times per week at home and recording the values.  Recommend bringing these recordings to the primary care physician.       Follow up 6 months with APP.   Signed, Steffanie Dunn, MD, [facility], [facility] 12/30/2022 6:54 PM    Electrophysiology [facility]

## 2022-12-31 ENCOUNTER — Encounter: Payer: Self-pay | Admitting: Cardiology

## 2022-12-31 ENCOUNTER — Other Ambulatory Visit (HOSPITAL_COMMUNITY): Payer: Self-pay

## 2022-12-31 ENCOUNTER — Ambulatory Visit: Payer: Medicare HMO | Attending: Cardiology | Admitting: Cardiology

## 2022-12-31 VITALS — BP 118/74 | HR 72 | Ht 69.0 in | Wt 274.0 lb

## 2022-12-31 DIAGNOSIS — Z79899 Other long term (current) drug therapy: Secondary | ICD-10-CM | POA: Diagnosis not present

## 2022-12-31 DIAGNOSIS — I10 Essential (primary) hypertension: Secondary | ICD-10-CM

## 2022-12-31 DIAGNOSIS — I480 Paroxysmal atrial fibrillation: Secondary | ICD-10-CM | POA: Diagnosis not present

## 2022-12-31 MED ORDER — AMLODIPINE BESYLATE 10 MG PO TABS: 10.0000 mg | ORAL_TABLET | Freq: Every day | ORAL | 6 refills | Status: DC

## 2022-12-31 NOTE — Progress Notes (Signed)
Electrophysiology Office Follow up Visit Note:    Date:  12/31/2022   ID:  Pamela Davenport, DOB [birth year], MRN [id number]  PCP:  [name], MD  [facility] Cardiologist:  Lanier Prude, MD  [facility] Electrophysiologist:  Hillis Range, MD (Inactive)    Interval History:    Pamela Davenport is a 67 y.o. female who presents for a follow up visit.   Last seen 06/21/2022 by Edd Fabian, NP.  She has a history of DVT (recurrent), PE, sickle cell trait, pAF since age 89.   Was seen by [facility] 04/07/2022. Had previously seen JA. Was on PRN flecainide.   Today, one of her main concerns is her blood pressure. Her PCP started her on indapamide at least a year ago. Since then she has noticed that her kidney function seems to be gradually worsening, and her GFR reportedly dropped from 51 to 46 in a week. Prior to indapamide, her blood pressure was as high as the 160s/80s, not consistently but was increasing.  She confirms having a prior ablation that was successful. Currently she is only taking flecainide if she feels that her episodes are sustained. However, she won't take it until she calls the office to confirm the dosing. She hasn't needed to take it in a while, her current bottle of flecainide is expired. She still has short runs of arrhythmia, but they don't last. She does take nadolol and xarelto daily.  Of note, she underwent lumpectomy 1 week ago. She has been working with PT and increasing her exercise. She has stopped drinking sodas and her ankles are not swelling lately.  She denies any chest pain, shortness of breath, lightheadedness, headaches, syncope, orthopnea, or PND.      Past medical, surgical, social and family history were reviewed.  ROS:   Please see the history of present illness.    (+) Palpitations All other systems reviewed and are negative.  EKGs/Labs/Other Studies Reviewed:    The following studies were reviewed today:  EKG:  The ekg ordered  today demonstrates sinus rhythm.  PR interval 194 ms.  QRS duration 96 ms.   Physical Exam:    VS:  BP 118/74   Pulse 72   Ht 5\' 9"  (1.753 m)   Wt 274 lb (124.3 kg)   SpO2 98%   BMI 40.46 kg/m     Wt Readings from Last 3 Encounters:  12/31/22 274 lb (124.3 kg)  12/21/22 276 lb 3.8 oz (125.3 kg)  11/17/22 281 lb 6.4 oz (127.6 kg)     GEN: Well nourished, well developed in no acute distress CARDIAC: RRR, no murmurs, rubs, gallops RESPIRATORY:  Clear to auscultation without rales, wheezing or rhonchi       ASSESSMENT:    1. Paroxysmal atrial fibrillation (HCC)   2. Primary hypertension   3. Encounter for long-term (current) use of high-risk medication    PLAN:    In order of problems listed above:  #pAF #high risk med monitoring - flecainide Minimal burden of AF on flec. ON xarelto for stroke ppx. PR and QRS duration acceptable for continued flecainide use as needed Continue nadolol.  #PVC's Cont nadolol.  #Hypertension At goal today.  Recommend checking blood pressures 1-2 times per week at home and recording the values.  Recommend bringing these recordings to the primary care physician. We will stop her indapamide given her concerns about her worsening kidney function.  She will stop that today and start amlodipine 10 tomorrow.  She will continue to monitor blood pressures closely.  I have referred her to the hypertension clinic with our office for further titration of her antihypertensive regimen.   Follow up 6 months with APP.  I,Mathew Stumpf,acting as a Neurosurgeon for Lanier Prude, MD.,have documented all relevant documentation on the behalf of Lanier Prude, MD,as directed by  Lanier Prude, MD while in the presence of Lanier Prude, MD.  I, Lanier Prude, MD, have reviewed all documentation for this visit. The documentation on 12/31/22 for the exam, diagnosis, procedures, and orders are all accurate and complete.   Signed, Steffanie Dunn, MD, [facility], [facility] 12/31/2022 8:58 AM    Electrophysiology [facility]

## 2022-12-31 NOTE — Patient Instructions (Addendum)
Medication Instructions:  Your physician has recommended you make the following change in your medication:  STOP Indapamide (Lozol) START Amlodipine (Norvasc) 10 mg once daily -- start this tomorrow  *If you need a refill on your cardiac medications before your next appointment, please call your pharmacy*   Lab Work: None ordered   Testing/Procedures: None ordered   Follow-Up: At [facility], you and your health needs are our priority.  As part of our continuing mission to provide you with exceptional heart care, we have created designated Provider Care Teams.  These Care Teams include your primary Cardiologist (physician) and Advanced Practice Providers (APPs -  Physician Assistants and Nurse Practitioners) who all work together to provide you with the care you need, when you need it.  Your next appointment:   6 month(s)  The format for your next appointment:   In Person  Provider:   You will see one of the following Advanced Practice Providers on your designated Care Team:   Francis Dowse, [place] Casimiro Needle "Mardelle Matte" [place], [place]   You have been referred to the Hypertension clinic.    Thank you for choosing [facility]!!   [phone]  Other Instructions  Amlodipine Tablets What is this medication? AMLODIPINE (am LOE di peen) treats high blood pressure and prevents chest pain (angina). It works by relaxing the blood vessels, which helps decrease the amount of work your heart has to do. It belongs to a group of medications called calcium channel blockers. This medicine may be used for other purposes; ask your health care provider or pharmacist if you have questions. COMMON BRAND NAME(S): Norvasc What should I tell my care team before I take this medication? They need to know if you have any of these conditions: Heart disease Liver disease An unusual or allergic reaction to amlodipine, other medications, foods, dyes, or preservatives Pregnant or trying to get  pregnant Breastfeeding How should I use this medication? Take this medication by mouth. Take it as directed on the prescription label at the same time every day. You can take it with or without food. If it upsets your stomach, take it with food. Keep taking it unless your care team tells you to stop. Talk to your care team about the use of this medication in children. While it may be prescribed for children as young as 6 for selected conditions, precautions do apply. Overdosage: If you think you have taken too much of this medicine contact a poison control center or emergency room at once. NOTE: This medicine is only for you. Do not share this medicine with others. What if I miss a dose? If you miss a dose, take it as soon as you can. If it is almost time for your next dose, take only that dose. Do not take double or extra doses. What may interact with this medication? Clarithromycin Cyclosporine Diltiazem Itraconazole Simvastatin Tacrolimus This list may not describe all possible interactions. Give your health care provider a list of all the medicines, herbs, non-prescription drugs, or dietary supplements you use. Also tell them if you smoke, drink alcohol, or use illegal drugs. Some items may interact with your medicine. What should I watch for while using this medication? Visit your care team for regular checks on your progress. Check your blood pressure as directed. Know what your blood pressure should be and when to contact your care team. Do not treat yourself for coughs, colds, or pain while you are using this medication without asking your care team  for advice. Some medications may increase your blood pressure. This medication may affect your coordination, reaction time, or judgment. Do not drive or operate machinery until you know how this medication affects you. Sit up or stand slowly to reduce the risk of dizzy or fainting spells. Drinking alcohol with this medication can increase the  risk of these side effects. What side effects may I notice from receiving this medication? Side effects that you should report to your care team as soon as possible: Allergic reactions--skin rash, itching, hives, swelling of the face, lips, tongue, or throat Heart attack--pain or tightness in the chest, shoulders, arms, or jaw, nausea, shortness of breath, cold or clammy skin, feeling faint or lightheaded Low blood pressure--dizziness, feeling faint or lightheaded, blurry vision Worsening chest pain (angina)--pain, pressure, or tightness in the chest, neck, back, or arms Side effects that usually do not require medical attention (report these to your care team if they continue or are bothersome): Facial flushing, redness Heart palpitations--rapid, pounding, or irregular heartbeat Nausea Stomach pain Swelling of the ankles, hands, or feet This list may not describe all possible side effects. Call your doctor for medical advice about side effects. You may report side effects to FDA at 1-800-FDA-1088. Where should I keep my medication? Keep out of the reach of children and pets. Store at room temperature between 20 and 25 degrees C (68 and 77 degrees F). Protect from light and moisture. Keep the container tightly closed. Get rid of any unused medication after the expiration date. To get rid of medications that are no longer needed or have expired: Take the medication to a medication take-back program. Check with your pharmacy or law enforcement to find a location. If you cannot return the medication, check the label or package insert to see if the medication should be thrown out in the garbage or flushed down the toilet. If you are not sure, ask your care team. If it is safe to put in the trash, empty the medication out of the container. Mix the medication with cat litter, dirt, coffee grounds, or other unwanted substance. Seal the mixture in a bag or container. Put it in the trash. NOTE: This sheet  is a summary. It may not cover all possible information. If you have questions about this medicine, talk to your doctor, pharmacist, or health care provider.  2023 Elsevier/Gold Standard (2022-03-08 00:00:00)

## 2023-01-01 NOTE — Progress Notes (Signed)
Patient Care Team: [name], MD as PCP - General (Internal Medicine) Lanier Prude, MD as PCP - Electrophysiology (Cardiology) Hillis Range, MD (Inactive) as Attending Physician (Cardiology) Almond Lint, MD as Consulting Physician (General Surgery) Serena Croissant, MD as Consulting Physician (Hematology and Oncology) Dorothy Puffer, MD as Consulting Physician (Radiation Oncology) Pershing Proud, RN as Oncology Nurse Navigator Donnelly Angelica, RN as Oncology Nurse Navigator  DIAGNOSIS: No diagnosis found.  SUMMARY OF ONCOLOGIC HISTORY: Oncology History  Malignant neoplasm of lower-inner quadrant of left breast in female, estrogen receptor positive (HCC)  11/08/2022 Surgery   Screening mammogram detected left breast masses at 8 o'clock position: 1.1 cm: Grade 1 IDC ER 95%, PR 80%, HER2 negative 0.7 cm mass: Grade 1 IDC ER 100%, PR 90%, HER2 negative, Ki-67 10%, multiple lymph nodes 1 was biopsied and benign discordant   11/15/2022 Initial Diagnosis   Primary malignant neoplasm of lower outer quadrant of female breast, left (HCC)   11/15/2022 Cancer Staging   Staging form: Breast, AJCC 8th Edition - Clinical stage from 11/15/2022: Stage IA (cT1c, cN0(f), cM0, G1, ER+, PR+, HER2-) - Signed by Ronny Bacon, PA-C on 11/15/2022 Stage prefix: Initial diagnosis Method of lymph node assessment: Core biopsy Histologic grading system: 3 grade system    Genetic Testing   [facility] Custom Cancer Panel+RNA was Negative. Report date is 11/29/2022.  The Custom Hereditary Cancers Panel offered by [facility] includes sequencing and/or deletion duplication testing of the following 43 genes: APC, ATM, AXIN2, BAP1, BARD1, BMPR1A, BRCA1, BRCA2, BRIP1, CDH1, CDK4, CDKN2A (p14ARF and p16INK4a only), CHEK2, CTNNA1, EPCAM (Deletion/duplication testing only), FH, GREM1 (promoter region duplication testing only), HOXB13, KIT, MBD4, MEN1, MLH1, MSH2, MSH3, MSH6, MUTYH, NF1, NHTL1, PALB2, PDGFRA,  PMS2, POLD1, POLE, PTEN, RAD51C, RAD51D, SMAD4, SMARCA4. STK11, TP53, TSC1, TSC2, and VHL.     CHIEF COMPLIANT: Follow-up after surgery  INTERVAL HISTORY: Pamela Davenport is a 67 y.o. female is here because of recent diagnosis of left breast cancer. She presents to the clinic for a follow-up.    ALLERGIES:  is allergic to methylprednisolone, sulfa antibiotics, sulfonamide derivatives, and macrobid [nitrofurantoin].  MEDICATIONS:  Current Outpatient Medications  Medication Sig Dispense Refill   ALPRAZolam (XANAX) 0.5 MG tablet Take 1 tablet (0.5 mg total) by mouth 2 (two) times daily. 60 tablet 2   amLODipine (NORVASC) 10 MG tablet Take 1 tablet (10 mg total) by mouth at bedtime. 30 tablet 6   clindamycin (CLEOCIN) 300 MG capsule Take 1 capsule (300 mg total) by mouth 3 (three) times daily. (Patient not taking: Reported on 12/31/2022) 21 capsule 2   clindamycin (CLINDAGEL) 1 % gel Apply 1 application topically every day (Patient not taking: Reported on 12/31/2022) 60 g 5   esomeprazole (NEXIUM) 40 MG capsule Take 1 capsule (40 mg total) by mouth daily. 90 capsule 1   flecainide (TAMBOCOR) 150 MG tablet Take 2 tablets by mouth at the onset of AFIB 30 tablet 3   hydroxychloroquine (PLAQUENIL) 200 MG tablet Take 1 tablet (200 mg total) by mouth 2 (two) times daily. 180 tablet 0   hydrOXYzine (ATARAX) 25 MG tablet Take 1 tablet (25 mg total) by mouth 3 (three) times daily as needed. 30 tablet 2   losartan (COZAAR) 100 MG tablet Take 1 tablet (100 mg total) by mouth daily. 90 tablet 3   nadolol (CORGARD) 20 MG tablet Take 1 tablet (20 mg total) by mouth daily. 90 tablet 3   nystatin-triamcinolone ointment (MYCOLOG) Apply  to affected areas two times a day 20 g 2   oxybutynin (DITROPAN-XL) 10 MG 24 hr tablet Take 10 mg by mouth at bedtime.     oxyCODONE (OXY IR/ROXICODONE) 5 MG immediate release tablet Take 1 tablet (5 mg total) by mouth every 6 (six) hours as needed for severe pain. 15 tablet 0    polyethylene glycol-electrolytes (NULYTELY) 420 g solution Use as directed for 2 day prep. 8000 mL 0   rivaroxaban (XARELTO) 20 MG TABS tablet Take 1 tablet (20 mg total) by mouth daily with supper. 90 tablet 3   TART CHERRY PO Take 2 tablets by mouth daily.     thiamine (VITAMIN B-1) 50 MG tablet Take 1 tablet (50 mg total) by mouth daily. 90 tablet 1   No current facility-administered medications for this visit.    PHYSICAL EXAMINATION: ECOG PERFORMANCE STATUS: {CHL ONC ECOG [phone]}  There were no vitals filed for this visit. There were no vitals filed for this visit.  BREAST:*** No palpable masses or nodules in either right or left breasts. No palpable axillary supraclavicular or infraclavicular adenopathy no breast tenderness or nipple discharge. (exam performed in the presence of a chaperone)  LABORATORY DATA:  I have reviewed the data as listed    Latest Ref Rng & Units 12/16/2022    3:46 PM 11/17/2022   12:01 PM 10/22/2022   11:50 AM  CMP  Glucose 70 - 99 mg/dL 82  99  91   BUN 8 - 23 mg/dL 29  31  21    Creatinine 0.44 - 1.00 mg/dL 4.09  8.11  9.14   Sodium 135 - 145 mmol/L 136  137  139   Potassium 3.5 - 5.1 mmol/L 4.4  4.9  4.3   Chloride 98 - 111 mmol/L 105  104  107   CO2 22 - 32 mmol/L 22  28  23    Calcium 8.9 - 10.3 mg/dL 9.3  9.3  9.0   Total Protein 6.5 - 8.1 g/dL  7.5  6.7   Total Bilirubin 0.3 - 1.2 mg/dL  0.4  0.4   Alkaline Phos 38 - 126 U/L  81    AST 15 - 41 U/L  16  14   ALT 0 - 44 U/L  11  9     Lab Results  Component Value Date   WBC 6.7 11/17/2022   HGB 11.1 (L) 11/17/2022   HCT 33.2 (L) 11/17/2022   MCV 82.4 11/17/2022   PLT 275 11/17/2022   NEUTROABS 4.5 11/17/2022    ASSESSMENT & PLAN:  No problem-specific Assessment & Plan notes found for this encounter.    No orders of the defined types were placed in this encounter.  The patient has a good understanding of the overall plan. she agrees with it. she will call with any problems  that may develop before the next visit here. Total time spent: 30 mins including face to face time and time spent for planning, charting and co-ordination of care   Sherlyn Lick, CMA 01/01/23    I Janan Ridge am acting as a Neurosurgeon for [employer]  ***

## 2023-01-03 DIAGNOSIS — H04123 Dry eye syndrome of bilateral lacrimal glands: Secondary | ICD-10-CM | POA: Diagnosis not present

## 2023-01-03 DIAGNOSIS — H2513 Age-related nuclear cataract, bilateral: Secondary | ICD-10-CM | POA: Diagnosis not present

## 2023-01-03 DIAGNOSIS — H35412 Lattice degeneration of retina, left eye: Secondary | ICD-10-CM | POA: Diagnosis not present

## 2023-01-03 DIAGNOSIS — Z79899 Other long term (current) drug therapy: Secondary | ICD-10-CM | POA: Diagnosis not present

## 2023-01-04 ENCOUNTER — Encounter: Payer: Self-pay | Admitting: *Deleted

## 2023-01-04 ENCOUNTER — Telehealth: Payer: Self-pay | Admitting: *Deleted

## 2023-01-04 ENCOUNTER — Inpatient Hospital Stay: Payer: Medicare HMO | Attending: Hematology and Oncology | Admitting: Hematology and Oncology

## 2023-01-04 VITALS — BP 132/72 | HR 75 | Temp 97.5°F | Resp 18 | Ht 69.0 in | Wt 275.6 lb

## 2023-01-04 DIAGNOSIS — Z170 Estrogen receptor positive status [ER+]: Secondary | ICD-10-CM | POA: Insufficient documentation

## 2023-01-04 DIAGNOSIS — C50312 Malignant neoplasm of lower-inner quadrant of left female breast: Secondary | ICD-10-CM | POA: Insufficient documentation

## 2023-01-04 NOTE — Telephone Encounter (Signed)
Received order for oncotype testing. Requisition sent to pathology. 

## 2023-01-04 NOTE — Assessment & Plan Note (Addendum)
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  Pathology counseling: I discussed the final pathology report of the patient provided  a copy of this report. I discussed the margins as well as lymph node surgeries. We also discussed the final staging along with previously performed ER/PR and HER-2/neu testing.  Treatment plan: Oncotype DX testing to determine if chemotherapy would be of any benefit followed by Adjuvant radiation therapy followed by Adjuvant antiestrogen therapy  Return to clinic based upon Oncotype DX test result

## 2023-01-11 ENCOUNTER — Encounter: Payer: Self-pay | Admitting: General Practice

## 2023-01-11 NOTE — Progress Notes (Signed)
[facility] Spiritual Care Note  Followed up with Pamela Pamela Davenport by phone. Despite the mild anxiety about waiting for her oncotype score (and possible consequent treatment plan changes), she is working to live her best life with joy and gratitude. Perspective and family support are helping her cope.   Provided reflective listening, emotional support, and affirmation of strengths. Pamela Davenport has direct Spiritual Care number, and we plan to follow up by phone next month.   [address] Rush Barer, [place], [facility] Pager [phone] Voicemail [phone]

## 2023-01-12 DIAGNOSIS — C50312 Malignant neoplasm of lower-inner quadrant of left female breast: Secondary | ICD-10-CM | POA: Diagnosis not present

## 2023-01-12 DIAGNOSIS — Z170 Estrogen receptor positive status [ER+]: Secondary | ICD-10-CM | POA: Diagnosis not present

## 2023-01-13 ENCOUNTER — Encounter (HOSPITAL_COMMUNITY): Payer: Self-pay

## 2023-01-14 ENCOUNTER — Telehealth: Payer: Self-pay | Admitting: *Deleted

## 2023-01-14 ENCOUNTER — Encounter: Payer: Self-pay | Admitting: *Deleted

## 2023-01-14 DIAGNOSIS — Z170 Estrogen receptor positive status [ER+]: Secondary | ICD-10-CM

## 2023-01-14 NOTE — Telephone Encounter (Signed)
Received oncotype results of 18/16%. Left message for a return phone call to give results. Referral placed for Dr. Mitzi Hansen.

## 2023-01-17 DIAGNOSIS — C50912 Malignant neoplasm of unspecified site of left female breast: Secondary | ICD-10-CM | POA: Diagnosis not present

## 2023-01-17 DIAGNOSIS — R310 Gross hematuria: Secondary | ICD-10-CM | POA: Diagnosis not present

## 2023-01-17 NOTE — Progress Notes (Signed)
Radiation Oncology         [phone]) [phone] ________________________________  Name: Pamela Davenport        MRN: [id number]  Date of Service: 11/17/2022 DOB: 1955/09/25  NW:GNFAO, Bernadene Bell, MD  Almond Lint, MD     REFERRING PHYSICIAN: Almond Lint, MD   DIAGNOSIS: The encounter diagnosis was Primary malignant neoplasm of lower outer quadrant of female breast, left (HCC).   HISTORY OF PRESENT ILLNESS: Pamela Davenport is a 67 y.o. female originally seen in the multidisciplinary breast clinic for a new diagnosis of left breast cancer. The patient has been followed with diagnostic imaging given a history of left breast masses that have been felt to be benign for several years.  She returned for diagnostic imaging in February 2024 which showed the lesion in the 8 o'clock position of the left breast had become slightly more prominent with possible associated distortion.  The second lesion was not well-visualized by mammography.  Targeted ultrasound was then performed.  In the left breast at 8:00 1 cm from the nipple there was a hypoechoic mass measuring 1.1 cm, a year ago this area measured 8 mm.  In addition also in the 8 o'clock position was a 7 mm oval mass, last March it had been 5 mm.  There were several mildly prominent left axillary lymph nodes seen by ultrasound as well.  She underwent biopsies on 11/08/2022.  The lesion 1 cm from the nipple in the 8 o'clock position showed a grade 1 invasive ductal carcinoma that was ER/PR positive HER2 negative with a Ki-67 of 10%.  A second biopsy 5 cm from the nipple in the 8 o'clock position showed grade 1 invasive ductal carcinoma as well, the specimen was also ER/PR positive, HER2 negative with a Ki-67 of 10%.  Her axillary lymph node specimen was negative for metastatic disease.    Since her last visit she underwent a left lumpectomy and sentinel node biopsy on 12/21/2022.  This showed 2 foci of invasive ductal carcinoma that was grade 1.  Each measured 10 mm  and 9 mm, one of the foci had lobular features.  There was also associated intermediate grade DCIS.  Margins were negative but less than 1 mm to the anterior edge for invasive disease and 2 mm inferiorly for invasive disease.  Her closest DCIS margins were less than 1 mm anteriorly and 2 mm inferiorly.  4 lymph nodes were examined 1 contained micrometastatic disease 1 contained micrometastatic disease, and the lymph node with micrometastatic disease was the 1 that had focal features of prior biopsy.  Oncotype Dx score was 18 so no systemic chemo was recommended.  She is seen to discuss adjuvant radiotherapy.    PREVIOUS RADIATION THERAPY: No   PAST MEDICAL HISTORY:  Past Medical History:  Diagnosis Date   Atypical chest pain    normal myoview 11/11/10   Blood transfusion without reported diagnosis    DVT (deep venous thrombosis) (HCC)    recurrent; chronically anticoagulated with coumadin   Family history of anesthesia complication    Sister had decrease in respirations after surgery   Heart murmur    Herniated lumbar intervertebral disc    Hypertension    Paroxysmal atrial fibrillation (HCC)    SINCE AGE 8    PE (pulmonary embolism)    chronically anticoagulated with coumadin   Pneumonia 07/20/12   PONV (postoperative nausea and vomiting)    Rheumatoid arthritis (HCC)    Sickle cell trait (HCC)  Sinus congestion 08/14/2018   C/O NASAL/CHEST  CONGESTION, COUGHING WITH PHLEGM, PER PATIENT , SHE IS TO BEGIN TAKING AUGMENTIN DOSE PACK TODAY;     Strep throat at age 75   The patient reports a severe strep throat infection at age 81 and   is not clear as to whether or not she may have had rheumatic fever.   Symptomatic premature ventricular contractions    improved s/p ablation   Urgency of urination    Uterine bleeding 2008   Uterine artery embolization in 2008 for uterine bleeding.        PAST SURGICAL HISTORY: Past Surgical History:  Procedure Laterality Date   BREAST BIOPSY  Left 11/08/2022   [place] LT BREAST BX W LOC DEV EA ADD LESION IMG BX SPEC US GUIDE 11/08/2022 [facility]   BREAST BIOPSY Left 11/08/2022   [place] LT BREAST BX W LOC DEV 1ST LESION IMG BX SPEC US GUIDE 11/08/2022 [facility]   COLONOSCOPY W/ POLYPECTOMY     Diagnostic D&C hysteroscopy and Novasure ablation  03/17/2004   DILATION AND CURETTAGE OF UTERUS  12/17/2011   Procedure: DILATATION AND CURETTAGE;  Surgeon: Kathreen Cosier, MD;  Location: [facility];  Service: Gynecology;  Laterality: N/A;  With Attempted Hydrothermal Ablation   LUMBAR LAMINECTOMY/DECOMPRESSION MICRODISCECTOMY Left 02/21/2013   Procedure: LUMBAR LAMINECTOMY/DECOMPRESSION MICRODISCECTOMY 1 LEVEL;  Surgeon: Carmela Hurt, MD;  Location: [facility];  Service: Neurosurgery;  Laterality: Left;  LEFT Lumbar Three-Four Laminotomy foraminotomy microdiskectomy   MANDIBLE SURGERY     under bite   PVC Ablation  2010   ROBOTIC ASSISTED TOTAL HYSTERECTOMY WITH BILATERAL SALPINGO OOPHERECTOMY Bilateral 08/21/2018   Procedure: XI ROBOTIC ASSISTED TOTAL HYSTERECTOMY WITH BILATERAL SALPINGO OOPHORECTOMY;  Surgeon: Adolphus Birchwood, MD;  Location: [facility];  Service: Gynecology;  Laterality: Bilateral;   SPINE SURGERY     TONSILLECTOMY     UTERINE ARTERY EMBOLIZATION  2008   for uterine bleeding   VASCULAR SURGERY     laser of left leg     FAMILY HISTORY:  Family History  Problem Relation Age of Onset   Hypertension Mother    Diabetes Mother    Uterine cancer Sister    Prostate cancer Brother    Prostate cancer Brother    Prostate cancer Brother    Colon cancer Paternal Grandfather    Healthy Son    Non-Hodgkin's lymphoma Paternal Aunt    Pancreatic cancer Paternal Aunt    Breast cancer Neg Hx      SOCIAL HISTORY:  reports that she quit smoking about 43 years ago. Her smoking use included cigarettes. She has a 2.00 pack-year smoking history. She has never been exposed to tobacco smoke. She has never used  smokeless tobacco. She reports current alcohol use. She reports that she does not use drugs.  The patient is single and lives in [place].  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   ALLERGIES: Methylprednisolone, Sulfa antibiotics, Sulfonamide derivatives, and Macrobid [nitrofurantoin]   MEDICATIONS:  Current Outpatient Medications  Medication Sig Dispense Refill   clindamycin (CLEOCIN) 300 MG capsule Take 1 capsule (300 mg total) by mouth 3 (three) times daily. (Patient not taking: Reported on 11/04/2022) 21 capsule 2   clindamycin (CLINDAGEL) 1 % gel Apply 1 application topically every day (Patient not taking: Reported on 11/04/2022) 60 g 5   esomeprazole (NEXIUM) 40 MG capsule Take 1 capsule (40 mg total) by mouth daily. 90 capsule 1  flecainide (TAMBOCOR) 150 MG tablet Take 2 tablets by mouth at the onset of AFIB 30 tablet 3   hydroxychloroquine (PLAQUENIL) 200 MG tablet Take 1 tablet (200 mg total) by mouth 2 (two) times daily. 180 tablet 0   hydrOXYzine (ATARAX) 25 MG tablet Take 1 tablet (25 mg total) by mouth 3 (three) times daily as needed. 30 tablet 2   indapamide (LOZOL) 1.25 MG tablet Take 1 tablet (1.25 mg total) by mouth daily. 90 tablet 0   losartan (COZAAR) 100 MG tablet Take 1 tablet (100 mg total) by mouth daily. 90 tablet 3   nadolol (CORGARD) 20 MG tablet Take 1 tablet (20 mg total) by mouth daily. 90 tablet 3   nystatin-triamcinolone ointment (MYCOLOG) Apply to affected areas two times a day 20 g 2   Omega-3 Fatty Acids (OMEGA 3 PO) Take 1 tablet by mouth daily. (Patient not taking: Reported on 11/04/2022)     polyethylene glycol-electrolytes (NULYTELY) 420 g solution Use as directed for 2 day prep. (Patient not taking: Reported on 11/04/2022) 8000 mL 0   rivaroxaban (XARELTO) 20 MG TABS tablet Take 1 tablet (20 mg total) by mouth daily with supper. 90 tablet 3   TART CHERRY PO Take 2 tablets by mouth daily.     thiamine (VITAMIN B-1) 50 MG tablet Take 1 tablet  (50 mg total) by mouth daily. (Patient not taking: Reported on 11/04/2022) 90 tablet 1   No current facility-administered medications for this encounter.     REVIEW OF SYSTEMS: On review of systems, the patient reports that she is doing well. She denies any breast specific complaints at this time.      PHYSICAL EXAM:  Wt Readings from Last 3 Encounters:  11/17/22 281 lb 6.4 oz (127.6 kg)  11/04/22 281 lb 12.8 oz (127.8 kg)  05/04/22 284 lb (128.8 kg)   Temp Readings from Last 3 Encounters:  11/17/22 (!) 97.2 F (36.2 C) (Temporal)  05/04/22 98.2 F (36.8 C) (Oral)  02/03/22 98.6 F (37 C) (Oral)   BP Readings from Last 3 Encounters:  11/17/22 (!) 156/67  11/04/22 (!) 143/83  05/04/22 136/74   Pulse Readings from Last 3 Encounters:  11/17/22 76  11/04/22 64  05/04/22 79    In general this is a well appearing African-American female in no acute distress. She's alert and oriented x4 and appropriate throughout the examination. Cardiopulmonary assessment is negative for acute distress and she exhibits normal effort. Bilateral breast exam is deferred.    ECOG = 0  0 - Asymptomatic (Fully active, able to carry on all predisease activities without restriction)  1 - Symptomatic but completely ambulatory (Restricted in physically strenuous activity but ambulatory and able to carry out work of a light or sedentary nature. For example, light housework, office work)  2 - Symptomatic, <50% in bed during the day (Ambulatory and capable of all self care but unable to carry out any work activities. Up and about more than 50% of waking hours)  3 - Symptomatic, >50% in bed, but not bedbound (Capable of only limited self-care, confined to bed or chair 50% or more of waking hours)  4 - Bedbound (Completely disabled. Cannot carry on any self-care. Totally confined to bed or chair)  5 - Death   Santiago Glad MM, Creech RH, Tormey DC, et al. [phone]). "Toxicity and response criteria of the [facility]". Am. Evlyn Clines. Oncol. 5 (6): 649-55    LABORATORY DATA:  Lab Results  Component  Value Date   WBC 6.7 11/17/2022   HGB 11.1 (L) 11/17/2022   HCT 33.2 (L) 11/17/2022   MCV 82.4 11/17/2022   PLT 275 11/17/2022   Lab Results  Component Value Date   NA 137 11/17/2022   K 4.9 11/17/2022   CL 104 11/17/2022   CO2 28 11/17/2022   Lab Results  Component Value Date   ALT 11 11/17/2022   AST 16 11/17/2022   ALKPHOS 81 11/17/2022   BILITOT 0.4 11/17/2022      RADIOGRAPHY: [place] AXILLARY NODE CORE BIOPSY LEFT  Addendum Date: 11/10/2022   ADDENDUM REPORT: 11/10/2022 09:49 ADDENDUM: Pathology revealed GRADE 1 INVASIVE DUCTAL CARCINOMA of the LEFT breast, 8 o'clock, 1 cmfn, (ribbon clip). This was found to be concordant by Dr. Baird Lyons. Pathology revealed GRADE 1 INVASIVE DUCTAL CARCINOMA of the LEFT breast, 8 o'clock, 5 cmfn, (heart clip). This was found to be concordant by Dr. Baird Lyons. Pathology revealed Lymph node, needle/core biopsy, LEFT axilla ONE LYMPH NODE, NEGATIVE FOR CARCINOMA. This was found to be DISCORDANT by Dr. Baird Lyons, with excision recommended Pathology results were discussed with the patient by telephone. The patient reported doing well after the biopsies with tenderness at the sites. Post biopsy instructions and care were reviewed and questions were answered. The patient was encouraged to call [facility] for any additional concerns. The patient was referred to [facility] on 11/17/2022, per patient request. Pathology results reported by Collene Mares RN on 11/10/2022. Electronically Signed   By: Baird Lyons M.D.   On: 11/10/2022 09:49   Result Date: 11/10/2022 CLINICAL DATA:  Indeterminate masses in the 8 o'clock region of the left breast 1 cm from the nipple, 8 o'clock region of the left breast 5 cm from the nipple and a left axillary lymph node.  EXAM: ULTRASOUND GUIDED LEFT BREAST CORE NEEDLE BIOPSIES COMPARISON:  Previous exam(s). PROCEDURE: I met with the patient and we discussed the procedure of ultrasound-guided biopsy, including benefits and alternatives. We discussed the high likelihood of a successful procedure. We discussed the risks of the procedure, including infection, bleeding, tissue injury, clip migration, and inadequate sampling. Informed written consent was given. The usual time-out protocol was performed immediately prior to the procedure. Lesion quadrant: 8 O'CLOCK 1 CM FROM THE NIPPLE Using sterile technique and 1% Lidocaine as local anesthetic, under direct ultrasound visualization, a 14 gauge spring-loaded device was used to perform biopsy of a mass in the 8 o'clock region of the left breast 1 cm from the nipple using a lateral to medial approach. At the conclusion of the procedure ribbon shaped tissue marker clip was deployed into the biopsy cavity. Follow up 2 view mammogram was performed and dictated separately. Lesion quadrant: 8 O'CLOCK 5 CM FROM THE NIPPLE Using sterile technique and 1% Lidocaine as local anesthetic, under direct ultrasound visualization, a 14 gauge spring-loaded device was used to perform biopsy of a mass in the 8 o'clock region of the left breast 5 cm from the nipple using a lateral to medial approach. At the conclusion of the procedure heart shaped tissue marker clip was deployed into the biopsy cavity. Follow up 2 view mammogram was performed and dictated separately. Lesion quadrant: LEFT AXILLA Using sterile technique and 1% Lidocaine as local anesthetic, under direct ultrasound visualization, a 14 gauge spring-loaded device was used to perform biopsy of a left axillary lymph node using a lateral to medial  approach. At the conclusion of the procedure spiral shaped HydroMARK clip shaped tissue marker clip was deployed into the biopsy cavity. Follow up 2 view mammogram was performed and dictated separately.  IMPRESSION: Ultrasound guided biopsies of the left breast and left axilla. No apparent complications. Electronically Signed: By: Baird Lyons M.D. On: 11/08/2022 13:59  [place] LT BREAST BX W LOC DEV EA ADD LESION IMG BX SPEC US GUIDE  Addendum Date: 11/10/2022   ADDENDUM REPORT: 11/10/2022 09:49 ADDENDUM: Pathology revealed GRADE 1 INVASIVE DUCTAL CARCINOMA of the LEFT breast, 8 o'clock, 1 cmfn, (ribbon clip). This was found to be concordant by Dr. Baird Lyons. Pathology revealed GRADE 1 INVASIVE DUCTAL CARCINOMA of the LEFT breast, 8 o'clock, 5 cmfn, (heart clip). This was found to be concordant by Dr. Baird Lyons. Pathology revealed Lymph node, needle/core biopsy, LEFT axilla ONE LYMPH NODE, NEGATIVE FOR CARCINOMA. This was found to be DISCORDANT by Dr. Baird Lyons, with excision recommended Pathology results were discussed with the patient by telephone. The patient reported doing well after the biopsies with tenderness at the sites. Post biopsy instructions and care were reviewed and questions were answered. The patient was encouraged to call [facility] for any additional concerns. The patient was referred to [facility] on 11/17/2022, per patient request. Pathology results reported by Collene Mares RN on 11/10/2022. Electronically Signed   By: Baird Lyons M.D.   On: 11/10/2022 09:49   Result Date: 11/10/2022 CLINICAL DATA:  Indeterminate masses in the 8 o'clock region of the left breast 1 cm from the nipple, 8 o'clock region of the left breast 5 cm from the nipple and a left axillary lymph node. EXAM: ULTRASOUND GUIDED LEFT BREAST CORE NEEDLE BIOPSIES COMPARISON:  Previous exam(s). PROCEDURE: I met with the patient and we discussed the procedure of ultrasound-guided biopsy, including benefits and alternatives. We discussed the high likelihood of a successful procedure. We discussed the risks of the procedure,  including infection, bleeding, tissue injury, clip migration, and inadequate sampling. Informed written consent was given. The usual time-out protocol was performed immediately prior to the procedure. Lesion quadrant: 8 O'CLOCK 1 CM FROM THE NIPPLE Using sterile technique and 1% Lidocaine as local anesthetic, under direct ultrasound visualization, a 14 gauge spring-loaded device was used to perform biopsy of a mass in the 8 o'clock region of the left breast 1 cm from the nipple using a lateral to medial approach. At the conclusion of the procedure ribbon shaped tissue marker clip was deployed into the biopsy cavity. Follow up 2 view mammogram was performed and dictated separately. Lesion quadrant: 8 O'CLOCK 5 CM FROM THE NIPPLE Using sterile technique and 1% Lidocaine as local anesthetic, under direct ultrasound visualization, a 14 gauge spring-loaded device was used to perform biopsy of a mass in the 8 o'clock region of the left breast 5 cm from the nipple using a lateral to medial approach. At the conclusion of the procedure heart shaped tissue marker clip was deployed into the biopsy cavity. Follow up 2 view mammogram was performed and dictated separately. Lesion quadrant: LEFT AXILLA Using sterile technique and 1% Lidocaine as local anesthetic, under direct ultrasound visualization, a 14 gauge spring-loaded device was used to perform biopsy of a left axillary lymph node using a lateral to medial approach. At the conclusion of the procedure spiral shaped HydroMARK clip shaped tissue marker clip was deployed into the biopsy cavity. Follow up 2 view  mammogram was performed and dictated separately. IMPRESSION: Ultrasound guided biopsies of the left breast and left axilla. No apparent complications. Electronically Signed: By: Baird Lyons M.D. On: 11/08/2022 13:59  [place] LT BREAST BX W LOC DEV 1ST LESION IMG BX SPEC US GUIDE  Addendum Date: 11/10/2022   ADDENDUM REPORT: 11/10/2022 09:49 ADDENDUM: Pathology revealed  GRADE 1 INVASIVE DUCTAL CARCINOMA of the LEFT breast, 8 o'clock, 1 cmfn, (ribbon clip). This was found to be concordant by Dr. Baird Lyons. Pathology revealed GRADE 1 INVASIVE DUCTAL CARCINOMA of the LEFT breast, 8 o'clock, 5 cmfn, (heart clip). This was found to be concordant by Dr. Baird Lyons. Pathology revealed Lymph node, needle/core biopsy, LEFT axilla ONE LYMPH NODE, NEGATIVE FOR CARCINOMA. This was found to be DISCORDANT by Dr. Baird Lyons, with excision recommended Pathology results were discussed with the patient by telephone. The patient reported doing well after the biopsies with tenderness at the sites. Post biopsy instructions and care were reviewed and questions were answered. The patient was encouraged to call [facility] for any additional concerns. The patient was referred to [facility] on 11/17/2022, per patient request. Pathology results reported by Collene Mares RN on 11/10/2022. Electronically Signed   By: Baird Lyons M.D.   On: 11/10/2022 09:49   Result Date: 11/10/2022 CLINICAL DATA:  Indeterminate masses in the 8 o'clock region of the left breast 1 cm from the nipple, 8 o'clock region of the left breast 5 cm from the nipple and a left axillary lymph node. EXAM: ULTRASOUND GUIDED LEFT BREAST CORE NEEDLE BIOPSIES COMPARISON:  Previous exam(s). PROCEDURE: I met with the patient and we discussed the procedure of ultrasound-guided biopsy, including benefits and alternatives. We discussed the high likelihood of a successful procedure. We discussed the risks of the procedure, including infection, bleeding, tissue injury, clip migration, and inadequate sampling. Informed written consent was given. The usual time-out protocol was performed immediately prior to the procedure. Lesion quadrant: 8 O'CLOCK 1 CM FROM THE NIPPLE Using sterile technique and 1% Lidocaine as local anesthetic, under direct  ultrasound visualization, a 14 gauge spring-loaded device was used to perform biopsy of a mass in the 8 o'clock region of the left breast 1 cm from the nipple using a lateral to medial approach. At the conclusion of the procedure ribbon shaped tissue marker clip was deployed into the biopsy cavity. Follow up 2 view mammogram was performed and dictated separately. Lesion quadrant: 8 O'CLOCK 5 CM FROM THE NIPPLE Using sterile technique and 1% Lidocaine as local anesthetic, under direct ultrasound visualization, a 14 gauge spring-loaded device was used to perform biopsy of a mass in the 8 o'clock region of the left breast 5 cm from the nipple using a lateral to medial approach. At the conclusion of the procedure heart shaped tissue marker clip was deployed into the biopsy cavity. Follow up 2 view mammogram was performed and dictated separately. Lesion quadrant: LEFT AXILLA Using sterile technique and 1% Lidocaine as local anesthetic, under direct ultrasound visualization, a 14 gauge spring-loaded device was used to perform biopsy of a left axillary lymph node using a lateral to medial approach. At the conclusion of the procedure spiral shaped HydroMARK clip shaped tissue marker clip was deployed into the biopsy cavity. Follow up 2 view mammogram was performed and dictated separately. IMPRESSION: Ultrasound guided biopsies of the left breast and left axilla. No apparent complications. Electronically Signed: By: Baird Lyons  M.D. On: 11/08/2022 13:59  MM CLIP PLACEMENT LEFT  Result Date: 11/08/2022 CLINICAL DATA:  Status post ultrasound-guided core biopsies of 2 masses in the left breast and a left axillary lymph node EXAM: 3D DIAGNOSTIC LEFT MAMMOGRAM POST ULTRASOUND BIOPSIES COMPARISON:  Previous exam(s). FINDINGS: 3D Mammographic images were obtained following ultrasound-guided core biopsies of mass in the 8 o'clock region of the left breast 1 cm from the nipple (ribbon clip), a mass in the 8 o'clock region of the  left breast 5 cm from the nipple (heart shaped clip) and a left axillary lymph node (spiral shaped HydroMARK clip). The clips are all located in appropriate position. IMPRESSION: Appropriate positioning of the ribbon shaped clip in the lower-inner quadrant of the left breast, heart shaped clip in the lower-inner quadrant of the left breast and spiral shaped HydroMARK clip in the left axilla. Final Assessment: Post Procedure Mammograms for Marker Placement Electronically Signed   By: Baird Lyons M.D.   On: 11/08/2022 14:14  MM DIAG BREAST TOMO BILATERAL  Result Date: 10/25/2022 CLINICAL DATA:  67 year old female presenting for follow-up of 2 likely benign left breast masses. Of note, the patient has rheumatoid arthritis. EXAM: DIGITAL DIAGNOSTIC BILATERAL MAMMOGRAM WITH TOMOSYNTHESIS; ULTRASOUND LEFT BREAST LIMITED TECHNIQUE: Bilateral digital diagnostic mammography and breast tomosynthesis was performed.; Targeted ultrasound examination of the left breast was performed. COMPARISON:  Previous exam(s). ACR Breast Density Category b: There are scattered areas of fibroglandular density. FINDINGS: The mass in the lower-inner quadrant of the left breast, likely corresponding with the mass on ultrasound at 8 o'clock appears slightly more prominent with possible associated distortion. The mass further posterior in the medial left breast is not clearly visualized on today's exam. No new suspicious calcifications, masses or areas of distortion are seen in the bilateral breasts. Ultrasound targeted to the left breast at 8 o'clock, 1 cm from the nipple demonstrates an irregular hypoechoic mass measuring 1.1 x 0.7 x 1.0 cm, previously measuring 0.8 x 0.5 x 0.7 mm in March of 2023. Ultrasound targeted to the left breast at 8 o'clock, 5 cm from the nipple demonstrates a slightly larger hypoechoic oval mass measuring 0.7 x 0.4 x 0.6 cm, previously 0.5 x 0.3 x 0.4 cm. Ultrasound of the left axilla demonstrates several mildly  prominent left axillary lymph nodes with cortices measuring up to 5 mm. IMPRESSION: 1. Mild interval increase in size of the 2 masses in the left breast at 8 o'clock. 2.  Multiple prominent left axillary lymph nodes. 3.  No evidence of right breast malignancy. RECOMMENDATION: Ultrasound guided biopsy is recommended for the 2 masses in the left breast and the largest of the left axillary lymph nodes. I have discussed the findings and recommendations with the patient. If applicable, a reminder letter will be sent to the patient regarding the next appointment. BI-RADS CATEGORY  4: Suspicious. Electronically Signed   By: Frederico Hamman M.D.   On: 10/25/2022 12:22  US BREAST LTD UNI LEFT INC AXILLA  Result Date: 10/25/2022 CLINICAL DATA:  67 year old female presenting for follow-up of 2 likely benign left breast masses. Of note, the patient has rheumatoid arthritis. EXAM: DIGITAL DIAGNOSTIC BILATERAL MAMMOGRAM WITH TOMOSYNTHESIS; ULTRASOUND LEFT BREAST LIMITED TECHNIQUE: Bilateral digital diagnostic mammography and breast tomosynthesis was performed.; Targeted ultrasound examination of the left breast was performed. COMPARISON:  Previous exam(s). ACR Breast Density Category b: There are scattered areas of fibroglandular density. FINDINGS: The mass in the lower-inner quadrant of the left breast, likely corresponding with the mass on  ultrasound at 8 o'clock appears slightly more prominent with possible associated distortion. The mass further posterior in the medial left breast is not clearly visualized on today's exam. No new suspicious calcifications, masses or areas of distortion are seen in the bilateral breasts. Ultrasound targeted to the left breast at 8 o'clock, 1 cm from the nipple demonstrates an irregular hypoechoic mass measuring 1.1 x 0.7 x 1.0 cm, previously measuring 0.8 x 0.5 x 0.7 mm in March of 2023. Ultrasound targeted to the left breast at 8 o'clock, 5 cm from the nipple demonstrates a slightly  larger hypoechoic oval mass measuring 0.7 x 0.4 x 0.6 cm, previously 0.5 x 0.3 x 0.4 cm. Ultrasound of the left axilla demonstrates several mildly prominent left axillary lymph nodes with cortices measuring up to 5 mm. IMPRESSION: 1. Mild interval increase in size of the 2 masses in the left breast at 8 o'clock. 2.  Multiple prominent left axillary lymph nodes. 3.  No evidence of right breast malignancy. RECOMMENDATION: Ultrasound guided biopsy is recommended for the 2 masses in the left breast and the largest of the left axillary lymph nodes. I have discussed the findings and recommendations with the patient. If applicable, a reminder letter will be sent to the patient regarding the next appointment. BI-RADS CATEGORY  4: Suspicious. Electronically Signed   By: Frederico Hamman M.D.   On: 10/25/2022 12:22      IMPRESSION/PLAN: 1. Stage IA, pT1cN1M0, grade 1, ER/PR positive invasive ductal carcinoma of the left breast. Dr. Mitzi Hansen has reviewed her final pathology. She has done well since surgery and does not need systemic chemotherapy based on oncotype score. We reviewed the role for systemic therapy. Provided that chemotherapy is not indicated, the patient's course would then be followed by external radiotherapy to the breast  to reduce risks of local recurrence followed by antiestrogen therapy. We discussed the risks, benefits, short, and long term effects of radiotherapy, as well as the curative intent, and the patient is interested in proceeding. We reviewed the delivery and logistics of radiotherapy and Dr. Mitzi Hansen recommends  6 1/2 weeks of radiotherapy to the left breast  and regional nodes with deep inspiration breath-hold technique. Written consent is obtained and placed in the chart, a copy was provided to the patient. She will simulate tomorrow. 2. Rheumatoid Arthritis.  Patient is followed by Dr. Corliss Skains, and remains on Plaquenil.  This should not interfere with plans for upcoming radiation.  In a  visit lasting *** minutes, greater than 50% of the time was spent face to face discussing the patient's condition, in preparation for the discussion, and coordinating the patient's care.      Osker Mason, [facility]       **Disclaimer: This note was dictated with voice recognition software. Similar sounding words can inadvertently be transcribed and this note may contain transcription errors which may not have been corrected upon publication of note.**

## 2023-01-18 ENCOUNTER — Encounter: Payer: Self-pay | Admitting: *Deleted

## 2023-01-19 ENCOUNTER — Ambulatory Visit
Admission: RE | Admit: 2023-01-19 | Discharge: 2023-01-19 | Disposition: A | Discharge status: Home / Self Care | Payer: Medicare HMO | Source: Ambulatory Visit | Attending: Radiation Oncology | Admitting: Radiation Oncology

## 2023-01-19 ENCOUNTER — Encounter: Payer: Self-pay | Admitting: Radiation Oncology

## 2023-01-19 VITALS — BP 130/62 | HR 64 | Temp 97.2°F | Resp 18 | Ht 69.0 in | Wt 281.4 lb

## 2023-01-19 DIAGNOSIS — M069 Rheumatoid arthritis, unspecified: Secondary | ICD-10-CM | POA: Diagnosis not present

## 2023-01-19 DIAGNOSIS — Z87891 Personal history of nicotine dependence: Secondary | ICD-10-CM | POA: Diagnosis not present

## 2023-01-19 DIAGNOSIS — R011 Cardiac murmur, unspecified: Secondary | ICD-10-CM | POA: Diagnosis not present

## 2023-01-19 DIAGNOSIS — I10 Essential (primary) hypertension: Secondary | ICD-10-CM | POA: Diagnosis not present

## 2023-01-19 DIAGNOSIS — C50512 Malignant neoplasm of lower-outer quadrant of left female breast: Secondary | ICD-10-CM | POA: Insufficient documentation

## 2023-01-19 DIAGNOSIS — Z79899 Other long term (current) drug therapy: Secondary | ICD-10-CM | POA: Insufficient documentation

## 2023-01-19 DIAGNOSIS — Z7901 Long term (current) use of anticoagulants: Secondary | ICD-10-CM | POA: Insufficient documentation

## 2023-01-19 DIAGNOSIS — I480 Paroxysmal atrial fibrillation: Secondary | ICD-10-CM | POA: Insufficient documentation

## 2023-01-19 DIAGNOSIS — Z86718 Personal history of other venous thrombosis and embolism: Secondary | ICD-10-CM | POA: Insufficient documentation

## 2023-01-19 DIAGNOSIS — Z170 Estrogen receptor positive status [ER+]: Secondary | ICD-10-CM

## 2023-01-19 DIAGNOSIS — Z800 Family history of malignant neoplasm of digestive organs: Secondary | ICD-10-CM | POA: Insufficient documentation

## 2023-01-19 DIAGNOSIS — D573 Sickle-cell trait: Secondary | ICD-10-CM | POA: Insufficient documentation

## 2023-01-19 NOTE — Therapy (Signed)
OUTPATIENT PHYSICAL THERAPY BREAST CANCER POST OP FOLLOW UP   Patient Name: Pamela Davenport MRN: [id number] DOB:1956-05-04, 67 y.o., female Today's Date: 01/20/2023  END OF SESSION:  PT End of Session - 01/20/23 1158     Visit Number 2    Number of Visits 2    PT Start Time 1154    PT Stop Time 1232    PT Time Calculation (min) 38 min    Activity Tolerance Patient tolerated treatment well    Behavior During Therapy [facility] for tasks assessed/performed             Past Medical History:  Diagnosis Date   Atypical chest pain    normal myoview 11/11/10   Blood transfusion without reported diagnosis    DVT (deep venous thrombosis) (HCC)    recurrent; chronically anticoagulated with coumadin   Dysrhythmia    Family history of anesthesia complication    Sister had decrease in respirations after surgery   Heart murmur    Herniated lumbar intervertebral disc    Hypertension    Paroxysmal atrial fibrillation (HCC)    SINCE AGE 57    PE (pulmonary embolism)    chronically anticoagulated with coumadin   Pneumonia 07/20/2012   PONV (postoperative nausea and vomiting)    Rheumatoid arthritis (HCC)    Sickle cell trait (HCC)    Sinus congestion 08/14/2018   C/O NASAL/CHEST  CONGESTION, COUGHING WITH PHLEGM, PER PATIENT , SHE IS TO BEGIN TAKING AUGMENTIN DOSE PACK TODAY;     Strep throat at age 53   The patient reports a severe strep throat infection at age 36 and   is not clear as to whether or not she may have had rheumatic fever.   Symptomatic premature ventricular contractions    improved s/p ablation   Urgency of urination    Uterine bleeding 2008   Uterine artery embolization in 2008 for uterine bleeding.    Past Surgical History:  Procedure Laterality Date   BREAST BIOPSY Left 11/08/2022   [place] LT BREAST BX W LOC DEV EA ADD LESION IMG BX SPEC US GUIDE 11/08/2022 [facility]   BREAST BIOPSY Left 11/08/2022   [place] LT BREAST BX W LOC DEV 1ST LESION IMG BX SPEC US GUIDE  11/08/2022 [facility]   BREAST BIOPSY Left 12/17/2022   [place] LT RADIOACTIVE SEED LOC 12/17/2022 [facility]   BREAST BIOPSY Left 12/17/2022   [place] LT RADIOACTIVE SEED EA ADD LESION 12/17/2022 [facility]   BREAST BIOPSY Left 12/17/2022   [place] LT RADIOACTIVE SEED EA ADD LESION 12/17/2022 [facility]   BREAST LUMPECTOMY WITH RADIOACTIVE SEED AND SENTINEL LYMPH NODE BIOPSY Left 12/21/2022   Procedure: LEFT BREAST BRACKETED SEED LUMPECTOMY AND LEFT SENTINEL LYMPH NODE MAPPING;  Surgeon: Harriette Bouillon, MD;  Location: [facility];  Service: General;  Laterality: Left;  GEN & PEC BLOCK   COLONOSCOPY W/ POLYPECTOMY     Diagnostic D&C hysteroscopy and Novasure ablation  03/17/2004   DILATION AND CURETTAGE OF UTERUS  12/17/2011   Procedure: DILATATION AND CURETTAGE;  Surgeon: Kathreen Cosier, MD;  Location: [facility];  Service: Gynecology;  Laterality: N/A;  With Attempted Hydrothermal Ablation   LUMBAR LAMINECTOMY/DECOMPRESSION MICRODISCECTOMY Left 02/21/2013   Procedure: LUMBAR LAMINECTOMY/DECOMPRESSION MICRODISCECTOMY 1 LEVEL;  Surgeon: Carmela Hurt, MD;  Location: [facility];  Service: Neurosurgery;  Laterality: Left;  LEFT Lumbar Three-Four Laminotomy foraminotomy microdiskectomy   MANDIBLE SURGERY     under bite   PVC Ablation  2010   ROBOTIC ASSISTED TOTAL HYSTERECTOMY WITH BILATERAL SALPINGO OOPHERECTOMY Bilateral 08/21/2018   Procedure: XI ROBOTIC ASSISTED TOTAL HYSTERECTOMY WITH BILATERAL SALPINGO OOPHORECTOMY;  Surgeon: Adolphus Birchwood, MD;  Location: [facility];  Service: Gynecology;  Laterality: Bilateral;   SPINE SURGERY     TONSILLECTOMY     UTERINE ARTERY EMBOLIZATION  2008   for uterine bleeding   VASCULAR SURGERY     laser of left leg   Patient Active Problem List   Diagnosis Date Noted   Genetic testing 11/30/2022   Family history of prostate cancer 11/19/2022   Malignant neoplasm of lower-inner quadrant of left breast in female,  estrogen receptor positive (HCC) 11/15/2022   Anemia due to acquired thiamine deficiency 05/07/2022   Deficiency anemia 05/04/2022   Gastroesophageal reflux disease with esophagitis without hemorrhage 05/04/2022   Esophageal dysphagia 05/04/2022   Intermittent palpitations 05/04/2022   OAB (overactive bladder) 12/31/2021   Encounter for general adult medical examination with abnormal findings 08/26/2021   Obesity, morbid, BMI 40.0-49.9 (HCC) 08/26/2021   PAF (paroxysmal atrial fibrillation) (HCC) 08/26/2021   GERD without esophagitis 10/04/2019   Vitamin D deficiency disease 09/25/2019   Hypercoagulable state (HCC) 09/24/2019   Iron deficiency anemia 12/07/2018   Other dietary vitamin B12 deficiency anemia 12/07/2018   Visit for screening mammogram 04/18/2017   Hyperlipidemia with target LDL less than 130 10/19/2016   Essential hypertension 01/13/2015   Chronic pulmonary embolism (HCC) 12/05/2008    REFERRING PROVIDER: Dr. Harriette Bouillon  REFERRING DIAG: Left breast cancer  THERAPY DIAG:  Malignant neoplasm of lower-inner quadrant of left breast in female, estrogen receptor positive (HCC)  Abnormal posture  Aftercare following surgery for neoplasm  Rationale for Evaluation and Treatment: Rehabilitation  ONSET DATE: 12/21/2022  SUBJECTIVE:                                                                                                                                                                                           SUBJECTIVE STATEMENT: Patient reports she underwent a left lumpectomy and sentinel node biopsy on 12/21/2022. She had 1 of 4 nodes positive and 1 with micrometastases. Her Oncotype score was low so she will proceed to radiation. She saw the radiation oncologist yesterday and was referred to PT and back to her surgeon to try to reduce the seroma.  PERTINENT HISTORY:  Patient was diagnosed on 11/09/2022 with left grade 1 invasive ductal carcinoma breast cancer.  She underwent a left lumpectomy and sentinel node biopsy on 12/21/2022. It is ER/PR positive and HER2 negative with a Ki67 of 10%.   PATIENT GOALS:  Reassess how my recovery  is going related to arm function, pain, and swelling.  PAIN:  Are you having pain? Yes: NPRS scale: 3/10 Pain location: left axilla Pain description: Soreness Aggravating factors: Rubbing along the bra Relieving factors: Nothing touching it  PRECAUTIONS: Recent Surgery, left UE Lymphedema risk  ACTIVITY LEVEL / LEISURE: Pt is walking 30 min/day except recently having trouble because she thinks she has gout in her right great toe   OBJECTIVE:   PATIENT SURVEYS:  QUICK DASH:  Quick Dash - 01/20/23 0001     Open a tight or new jar Mild difficulty    Do heavy household chores (wash walls, wash floors) Mild difficulty    Carry a shopping bag or briefcase No difficulty    Wash your back No difficulty    Use a knife to cut food No difficulty    Recreational activities in which you take some force or impact through your arm, shoulder, or hand (golf, hammering, tennis) No difficulty    During the past week, to what extent has your arm, shoulder or hand problem interfered with your normal social activities with family, friends, neighbors, or groups? Not at all    During the past week, to what extent has your arm, shoulder or hand problem limited your work or other regular daily activities Not at all    Arm, shoulder, or hand pain. Mild    Tingling (pins and needles) in your arm, shoulder, or hand Mild    Difficulty Sleeping Mild difficulty    DASH Score 11.36 %              OBSERVATIONS: Left breast and axillary incisions both appear to be healing well with no signs of infection. She appears to have a seroma present at the axillary incision site but sees her surgeon tomorrow. Applied compression foam in stockinette to left lateral breast to reduce edema. Pt reported immediate relief.  POSTURE:  Forward head and  rounded shoulders  LYMPHEDEMA ASSESSMENT:   UPPER EXTREMITY AROM/PROM:   A/PROM RIGHT   eval    Shoulder extension 56  Shoulder flexion 147  Shoulder abduction 146  Shoulder internal rotation 80  Shoulder external rotation 81                          (Blank rows = not tested)   A/PROM LEFT   eval LEFT 01/20/2023  Shoulder extension 72 60  Shoulder flexion 150 145  Shoulder abduction 160 155  Shoulder internal rotation 73 78  Shoulder external rotation 80 77                          (Blank rows = not tested)   CERVICAL AROM: All within normal limits   UPPER EXTREMITY STRENGTH: WFL   LYMPHEDEMA ASSESSMENTS:    LANDMARK RIGHT   eval RIGHT 01/20/2023  10 cm proximal to olecranon process 42.8 42.4  Olecranon process 30.3 30  10  cm proximal to ulnar styloid process 25 24.4  Just proximal to ulnar styloid process 16.3 17.2  Across hand at thumb web space 19 18.6  At base of 2nd digit 6.7 6.5  (Blank rows = not tested)   LANDMARK LEFT   eval LEFT 01/20/2023  10 cm proximal to olecranon process 40.4 40.9  Olecranon process 29.9 30.5  10 cm proximal to ulnar styloid process 23.3 23.5  Just proximal to ulnar styloid process 15.9 15.9  Across hand at  thumb web space 18.7 18.1  At base of 2nd digit 6.3 6.4  (Blank rows = not tested)  Surgery type/Date: 12/21/2022 left lumpectomy and sentinel node biopsy Number of lymph nodes removed: 4 Current/past treatment (chemo, radiation, hormone therapy): none Other symptoms:  Heaviness/tightness Yes Pain Yes Pitting edema No Infections No Decreased scar mobility Yes Stemmer sign No  PATIENT EDUCATION:  Education details: HEP review Person educated: Patient Education method: Explanation Education comprehension: verbalized understanding  HOME EXERCISE PROGRAM: Reviewed previously given post op HEP.   ASSESSMENT:  CLINICAL IMPRESSION: Patient is doing very well s/p left lumpectomy and sentinel node biopsy on  12/21/2022. She has had issues with a draining seroma at the incision where axillary nodes were taken. There still appears to be a seroma in that area so compression foam was placed and pt reported decreased discomfort with the foam. She sees her surgeon tomorrow to see if it needs to be drained. Her shoulder ROM and function is back to baseline and her incisions are healing well. She has no further need for PT except to return for SOZO screens.   Pt will benefit from skilled therapeutic intervention to improve on the following deficits: Decreased knowledge of precautions, impaired UE functional use, pain, decreased ROM, postural dysfunction.   PT treatment/interventions: ADL/Self care home management, Therapeutic exercises, Therapeutic activity, Patient/Family education, Self Care, and Re-evaluation   GOALS: Goals reviewed with patient? Yes  LONG TERM GOALS:  (STG=LTG)  GOALS Name Target Date  Goal status  1 Pt will demonstrate she has regained full shoulder ROM and function post operatively compared to baselines.  Baseline: 01/12/2023 MET     PLAN:  PT FREQUENCY/DURATION: N/A  PLAN FOR NEXT SESSION: D/C   [facility]  [address], [address]  [place] [address]  [phone]  After Breast Cancer Class It is recommended you attend the ABC class to be educated on lymphedema risk reduction. This class is free of charge and lasts for 1 hour. It is a 1-time class. You will need to download the TEAMS app either on your phone or computer. We will send you a link the night before or the morning of the class. You should be able to click on that link to join the class. This is not a confidential class. You don't have to turn your camera on, but other participants may be able to see your email address. You're' scheduled for June 17th at 12:00.  Scar massage You can begin gentle scar massage to you incision sites. Gently place one hand on the incision and move the skin  (without sliding on the skin) in various directions. Do this for a few minutes and then you can gently massage either coconut oil or vitamin E cream into the scars.  Compression garment You should continue wearing your compression bra until you feel like you no longer have swelling.  Home exercise Program Continue doing the exercises you were given until you feel like you can do them without feeling any tightness at the end.   Walking Program Studies show that 30 minutes of walking per day (fast enough to elevate your heart rate) can significantly reduce the risk of a cancer recurrence. If you can't walk due to other medical reasons, we encourage you to find another activity you could do (like a stationary bike or water exercise).  Posture After breast cancer surgery, people frequently sit with rounded shoulders posture because it puts their incisions on slack and feels  better. If you sit like this and scar tissue forms in that position, you can become very tight and have pain sitting or standing with good posture. Try to be aware of your posture and sit and stand up tall to heal properly.  Follow up PT: It is recommended you return every 3 months for the first 3 years following surgery to be assessed on the SOZO machine for an L-Dex score. This helps prevent clinically significant lymphedema in 95% of patients. These follow up screens are 10 minute appointments that you are not billed for. You're scheduled for August 25th at 11:50.  PHYSICAL THERAPY DISCHARGE SUMMARY  Visits from Start of Care: 2  Current functional level related to goals / functional outcomes: Goals met; see above for objective measurements   Remaining deficits: No except seroma in left axillary region   Education / Equipment: Lymphedema risk reduction education and HEP   Patient agrees to discharge. Patient goals were met. Patient is being discharged due to meeting the stated rehab goals.  Bethann Punches,  [place] 01/20/23 12:41 PM

## 2023-01-19 NOTE — Progress Notes (Signed)
Nursing interview for a 67 yr old female w/ Primary malignant neoplasm of lower outer quadrant of female breast, left (HCC). Patient identity verified x2.  Patient reports LT arm/ axilla tenderness 3/10. No other issues conveyed at this time.  Meaningful use complete. Hysterectomy   Vitals- BP 130/62 (BP Location: Right Arm, Patient Position: Sitting, Cuff Size: Large)   Pulse 64   Temp (!) 97.2 F (36.2 C) (Temporal)   Resp 18   Ht 5\' 9"  (1.753 m)   Wt 281 lb 6 oz (127.6 kg)   SpO2 99%   BMI 41.55 kg/m   This concludes the interview.   Ruel Favors, LPN

## 2023-01-20 ENCOUNTER — Ambulatory Visit: Payer: Medicare HMO | Attending: Surgery | Admitting: Physical Therapy

## 2023-01-20 ENCOUNTER — Ambulatory Visit: Payer: Medicare HMO | Admitting: Radiation Oncology

## 2023-01-20 ENCOUNTER — Encounter: Payer: Self-pay | Admitting: Physical Therapy

## 2023-01-20 DIAGNOSIS — Z170 Estrogen receptor positive status [ER+]: Secondary | ICD-10-CM | POA: Diagnosis not present

## 2023-01-20 DIAGNOSIS — Z483 Aftercare following surgery for neoplasm: Secondary | ICD-10-CM | POA: Insufficient documentation

## 2023-01-20 DIAGNOSIS — C50312 Malignant neoplasm of lower-inner quadrant of left female breast: Secondary | ICD-10-CM | POA: Diagnosis not present

## 2023-01-20 DIAGNOSIS — R293 Abnormal posture: Secondary | ICD-10-CM | POA: Insufficient documentation

## 2023-01-20 NOTE — Patient Instructions (Signed)
[facility]  [address], [address]  [place] [address]  [phone]  After Breast Cancer Class It is recommended you attend the ABC class to be educated on lymphedema risk reduction. This class is free of charge and lasts for 1 hour. It is a 1-time class. You will need to download the TEAMS app either on your phone or computer. We will send you a link the night before or the morning of the class. You should be able to click on that link to join the class. This is not a confidential class. You don't have to turn your camera on, but other participants may be able to see your email address. You're' scheduled for June 17th at 12:00.  Scar massage You can begin gentle scar massage to you incision sites. Gently place one hand on the incision and move the skin (without sliding on the skin) in various directions. Do this for a few minutes and then you can gently massage either coconut oil or vitamin E cream into the scars.  Compression garment You should continue wearing your compression bra until you feel like you no longer have swelling.  Home exercise Program Continue doing the exercises you were given until you feel like you can do them without feeling any tightness at the end.   Walking Program Studies show that 30 minutes of walking per day (fast enough to elevate your heart rate) can significantly reduce the risk of a cancer recurrence. If you can't walk due to other medical reasons, we encourage you to find another activity you could do (like a stationary bike or water exercise).  Posture After breast cancer surgery, people frequently sit with rounded shoulders posture because it puts their incisions on slack and feels better. If you sit like this and scar tissue forms in that position, you can become very tight and have pain sitting or standing with good posture. Try to be aware of your posture and sit and stand up tall to heal properly.  Follow up PT: It is  recommended you return every 3 months for the first 3 years following surgery to be assessed on the SOZO machine for an L-Dex score. This helps prevent clinically significant lymphedema in 95% of patients. These follow up screens are 10 minute appointments that you are not billed for. You're scheduled for August 25th at 11:50.

## 2023-01-25 DIAGNOSIS — R310 Gross hematuria: Secondary | ICD-10-CM | POA: Diagnosis not present

## 2023-01-25 DIAGNOSIS — N3941 Urge incontinence: Secondary | ICD-10-CM | POA: Diagnosis not present

## 2023-01-26 ENCOUNTER — Encounter: Payer: Self-pay | Admitting: *Deleted

## 2023-01-27 ENCOUNTER — Encounter: Payer: Self-pay | Admitting: Student

## 2023-01-27 ENCOUNTER — Other Ambulatory Visit: Payer: Self-pay

## 2023-01-27 ENCOUNTER — Other Ambulatory Visit (HOSPITAL_COMMUNITY): Payer: Self-pay

## 2023-01-27 ENCOUNTER — Telehealth: Payer: Self-pay | Admitting: Radiation Oncology

## 2023-01-27 ENCOUNTER — Other Ambulatory Visit: Payer: Self-pay | Admitting: Internal Medicine

## 2023-01-27 ENCOUNTER — Ambulatory Visit: Payer: Medicare HMO | Attending: Internal Medicine | Admitting: Student

## 2023-01-27 VITALS — BP 138/80 | HR 66

## 2023-01-27 DIAGNOSIS — I10 Essential (primary) hypertension: Secondary | ICD-10-CM | POA: Diagnosis not present

## 2023-01-27 DIAGNOSIS — K2100 Gastro-esophageal reflux disease with esophagitis, without bleeding: Secondary | ICD-10-CM

## 2023-01-27 MED ORDER — ESOMEPRAZOLE MAGNESIUM 40 MG PO CPDR
40.0000 mg | DELAYED_RELEASE_CAPSULE | Freq: Every day | ORAL | 0 refills | Status: DC
Start: 2023-01-27 — End: 2023-12-20
  Filled 2023-01-27 – 2023-05-23 (×2): qty 90, 90d supply, fill #0

## 2023-01-27 MED ORDER — IRBESARTAN 300 MG PO TABS: 300.0000 mg | ORAL_TABLET | Freq: Every day | ORAL | 3 refills | Status: DC

## 2023-01-27 NOTE — Assessment & Plan Note (Signed)
Assessment: BP is uncontrolled in office BP 138/80 mmHg heart rate 66 (goal <130/80) Home BP ~140-150/70-80 range  Takes current BP medications regularly and tolerates them well  Reports occasional headaches and palpitation  Denies SOB, dizziness, chest pain,or swelling Exercise - walking 30 min every day  Following low salt diet    Plan:  Will switch losartan 100 mg to longer acting ARB irbesartan 300 mg daily  Continue taking nadolol 20 mg daily, amlodipine 10 mg daily  Patient to keep record of BP readings with heart rate and report to [place] at the next visit Bring home BP monitor for validation next visit Patient to see PharmD in 4-5 weeks for follow up  Follow up lab(s) BMP today and BMP in 2 weeks

## 2023-01-27 NOTE — Patient Instructions (Addendum)
Changes made by your pharmacist Carmela Hurt, PharmD at today's visit:    Instructions/Changes  (what do you need to do) Your Notes  (what you did and when you did it)  1.stop taking losartan 100 mg daily and start taking irbesartan 300 mg daily    2. Continue taking nadolol 20 mg daily, amlodipine 10 mg daily   3. Continue following low salt diet and doing regular exercise     Bring all of your meds, your BP cuff and your record of home blood pressures to your next appointment.    HOW TO TAKE YOUR BLOOD PRESSURE AT HOME  Rest 5 minutes before taking your blood pressure.  Don't smoke or drink caffeinated beverages for at least 30 minutes before. Take your blood pressure before (not after) you eat. Sit comfortably with your back supported and both feet on the floor (don't cross your legs). Elevate your arm to heart level on a table or a desk. Use the proper sized cuff. It should fit smoothly and snugly around your bare upper arm. There should be enough room to slip a fingertip under the cuff. The bottom edge of the cuff should be 1 inch above the crease of the elbow. Ideally, take 3 measurements at one sitting and record the average.  Important lifestyle changes to control high blood pressure  Intervention  Effect on the BP  Lose extra pounds and watch your waistline Weight loss is one of the most effective lifestyle changes for controlling blood pressure. If you're overweight or obese, losing even a small amount of weight can help reduce blood pressure. Blood pressure might go down by about 1 millimeter of mercury (mm Hg) with each kilogram (about 2.2 pounds) of weight lost.  Exercise regularly As a general goal, aim for at least 30 minutes of moderate physical activity every day. Regular physical activity can lower high blood pressure by about 5 to 8 mm Hg.  Eat a healthy diet Eating a diet rich in whole grains, fruits, vegetables, and low-fat dairy products and low in saturated fat  and cholesterol. A healthy diet can lower high blood pressure by up to 11 mm Hg.  Reduce salt (sodium) in your diet Even a small reduction of sodium in the diet can improve heart health and reduce high blood pressure by about 5 to 6 mm Hg.  Limit alcohol One drink equals 12 ounces of beer, 5 ounces of wine, or 1.5 ounces of 80-proof liquor.  Limiting alcohol to less than one drink a day for women or two drinks a day for men can help lower blood pressure by about 4 mm Hg.   If you have any questions or concerns please use My Chart to send questions or call the office at [phone]

## 2023-01-27 NOTE — Progress Notes (Signed)
Patient ID: Pamela Davenport                 DOB: [birth year]                      MRN: [id number]      HPI: Pamela Davenport is a 67 y.o. female referred by Dr.Lambert to HTN clinic. PMH is significant for hx of PE, HTN, PAF GERD, HDL,obesity.   Last seen 06/21/2022 by Edd Fabian, NP. She has a history of DVT (recurrent), PE, sickle cell trait, pAF since age 16. Was seen by [facility] 04/07/2022. Had previously seen JA. Was on PRN flecainide. Saw Dr.Lambert on 12/31/2022 indapamide was d/c as patient was concern about worsening kidney function. Amlodipine 10 mg was started.  Patient presented today for hypertension clinic. Did not bring home BP readings, reports she checks BP twice week it ~140-150/70-80 range. She has occasional headaches and palpitation she does not have any dizziness. Takes BP medications regularly and tolerates them well. She was told by her kidney doctor that her eGFR is 70 so she wanted to get that BMP rechecked today. She eats healthy low salt diet and has cut down on soda. She go for 30 min walk everyday     Current HTN meds: losartan 100 mg daily, nadolol 20 mg daily, amlodipine 10 mg daily  Previously tried: indapamide 1.25 mg daily - patient was concern about declining renal function  BP goal: <130/80 mmHg  Family History:  Mother: hypertension, T2DM Sister: died from uterine cancer  Brothers: prostate cancer  Paternal GM: colon cancer   Social History:  Alcohol: never  Smoking: quit 40 year ago  Drug use: never   Diet: cut down on soda, low salt diet   Exercise: 30 min walking    Home BP readings: couple times week 140-150/70-80     Wt Readings from Last 3 Encounters:  01/19/23 281 lb 6 oz (127.6 kg)  01/04/23 275 lb 9.6 oz (125 kg)  12/31/22 274 lb (124.3 kg)   BP Readings from Last 3 Encounters:  01/27/23 138/80  01/19/23 130/62  01/04/23 132/72   Pulse Readings from Last 3 Encounters:  01/27/23 66  01/19/23 64  01/04/23 75    Renal  function: CrCl cannot be calculated (Patient's most recent lab result is older than the maximum 21 days allowed.).  Past Medical History:  Diagnosis Date   Atypical chest pain    normal myoview 11/11/10   Blood transfusion without reported diagnosis    DVT (deep venous thrombosis) (HCC)    recurrent; chronically anticoagulated with coumadin   Dysrhythmia    Family history of anesthesia complication    Sister had decrease in respirations after surgery   Heart murmur    Herniated lumbar intervertebral disc    Hypertension    Paroxysmal atrial fibrillation (HCC)    SINCE AGE 77    PE (pulmonary embolism)    chronically anticoagulated with coumadin   Pneumonia 07/20/2012   PONV (postoperative nausea and vomiting)    Rheumatoid arthritis (HCC)    Sickle cell trait (HCC)    Sinus congestion 08/14/2018   C/O NASAL/CHEST  CONGESTION, COUGHING WITH PHLEGM, PER PATIENT , SHE IS TO BEGIN TAKING AUGMENTIN DOSE PACK TODAY;     Strep throat at age 79   The patient reports a severe strep throat infection at age 51 and   is not clear as to whether or not she may have had rheumatic  fever.   Symptomatic premature ventricular contractions    improved s/p ablation   Urgency of urination    Uterine bleeding 2008   Uterine artery embolization in 2008 for uterine bleeding.     Current Outpatient Medications on File Prior to Visit  Medication Sig Dispense Refill   ALPRAZolam (XANAX) 0.5 MG tablet Take 1 tablet (0.5 mg total) by mouth 2 (two) times daily. 60 tablet 2   amLODipine (NORVASC) 10 MG tablet Take 1 tablet (10 mg total) by mouth at bedtime. 30 tablet 6   flecainide (TAMBOCOR) 150 MG tablet Take 2 tablets by mouth at the onset of AFIB 30 tablet 3   hydroxychloroquine (PLAQUENIL) 200 MG tablet Take 1 tablet (200 mg total) by mouth 2 (two) times daily. 180 tablet 0   hydrOXYzine (ATARAX) 25 MG tablet Take 1 tablet (25 mg total) by mouth 3 (three) times daily as needed. 30 tablet 2   nadolol  (CORGARD) 20 MG tablet Take 1 tablet (20 mg total) by mouth daily. 90 tablet 3   nystatin-triamcinolone ointment (MYCOLOG) Apply to affected areas two times a day 20 g 2   oxybutynin (DITROPAN-XL) 10 MG 24 hr tablet Take 10 mg by mouth at bedtime.     rivaroxaban (XARELTO) 20 MG TABS tablet Take 1 tablet (20 mg total) by mouth daily with supper. 90 tablet 3   TART CHERRY PO Take 2 tablets by mouth daily.     thiamine (VITAMIN B-1) 50 MG tablet Take 1 tablet (50 mg total) by mouth daily. 90 tablet 1   No current facility-administered medications on file prior to visit.    Allergies  Allergen Reactions   Methylprednisolone     REACTION: Atrial fibrillation at end of medrol dose pack   Sulfa Antibiotics Hives   Sulfonamide Derivatives Hives   Macrobid [Nitrofurantoin]     Muscle tightening and cramps     Blood pressure 138/80, pulse 66, SpO2 99 %.    Essential hypertension Assessment: BP is uncontrolled in office BP 138/80 mmHg heart rate 66 (goal <130/80) Home BP ~140-150/70-80 range  Takes current BP medications regularly and tolerates them well  Reports occasional headaches and palpitation  Denies SOB, dizziness, chest pain,or swelling Exercise - walking 30 min every day  Following low salt diet    Plan:  Will switch losartan 100 mg to longer acting ARB irbesartan 300 mg daily  Continue taking nadolol 20 mg daily, amlodipine 10 mg daily  Patient to keep record of BP readings with heart rate and report to [place] at the next visit Bring home BP monitor for validation next visit Patient to see PharmD in 4-5 weeks for follow up  Follow up lab(s) BMP today and BMP in 2 weeks     Thank you  Carmela Hurt, Pharm.D [facility] A Division of  [facility] [address], [place], [place] [address]  Phone: [phone]; Fax: [phone]

## 2023-01-27 NOTE — Telephone Encounter (Signed)
5/30 @ 9:33 am Patient left voicemail to confirm if her CT Sim appt was rescheduled after her appt with Dr. Luisa Hart, due to that appt was cancel and rescheduled for Monday 6/3. Patient stated Jill Side, [place] wanted to see her back after her appt with Dr. Luisa Hart.  Patient is aware her CT Sim been changed to Tues. 6/4 and will be here.

## 2023-01-28 ENCOUNTER — Other Ambulatory Visit (HOSPITAL_COMMUNITY): Payer: Self-pay

## 2023-01-28 ENCOUNTER — Ambulatory Visit: Payer: Medicare HMO | Admitting: Radiation Oncology

## 2023-01-28 ENCOUNTER — Other Ambulatory Visit: Payer: Self-pay

## 2023-01-28 LAB — BASIC METABOLIC PANEL
BUN/Creatinine Ratio: 17 (ref 12–28)
BUN: 20 mg/dL (ref 8–27)
CO2: 18 mmol/L — ABNORMAL LOW (ref 20–29)
Calcium: 9.2 mg/dL (ref 8.7–10.3)
Chloride: 108 mmol/L — ABNORMAL HIGH (ref 96–106)
Creatinine, Ser: 1.16 mg/dL — ABNORMAL HIGH (ref 0.57–1.00)
Glucose: 99 mg/dL (ref 70–99)
Potassium: 4.4 mmol/L (ref 3.5–5.2)
Sodium: 141 mmol/L (ref 134–144)
eGFR: 52 mL/min/{1.73_m2} — ABNORMAL LOW (ref >59)

## 2023-01-31 ENCOUNTER — Other Ambulatory Visit (HOSPITAL_COMMUNITY): Payer: Self-pay

## 2023-02-01 ENCOUNTER — Ambulatory Visit
Admission: RE | Admit: 2023-02-01 | Discharge: 2023-02-01 | Disposition: A | Discharge status: Home / Self Care | Payer: Medicare HMO | Source: Ambulatory Visit | Attending: Radiation Oncology | Admitting: Radiation Oncology

## 2023-02-01 DIAGNOSIS — Z170 Estrogen receptor positive status [ER+]: Secondary | ICD-10-CM | POA: Diagnosis not present

## 2023-02-01 DIAGNOSIS — C773 Secondary and unspecified malignant neoplasm of axilla and upper limb lymph nodes: Secondary | ICD-10-CM | POA: Diagnosis not present

## 2023-02-01 DIAGNOSIS — Z510 Encounter for antineoplastic radiation therapy: Secondary | ICD-10-CM | POA: Insufficient documentation

## 2023-02-01 DIAGNOSIS — C50512 Malignant neoplasm of lower-outer quadrant of left female breast: Secondary | ICD-10-CM | POA: Diagnosis not present

## 2023-02-03 ENCOUNTER — Encounter: Payer: Self-pay | Admitting: *Deleted

## 2023-02-03 DIAGNOSIS — Z170 Estrogen receptor positive status [ER+]: Secondary | ICD-10-CM

## 2023-02-04 ENCOUNTER — Telehealth: Payer: Self-pay | Admitting: Hematology and Oncology

## 2023-02-04 NOTE — Telephone Encounter (Signed)
Scheduled appointment per scheduling message. Patient is aware of the made appointment. 

## 2023-02-08 ENCOUNTER — Encounter: Payer: Self-pay | Admitting: General Practice

## 2023-02-08 NOTE — Progress Notes (Signed)
[facility] Spiritual Care Note  Reached Ms Duvall by phone at an inconvenient time, but she was eager to follow up. We plan a follow-up call later this week.   [address] Rush Barer, [place], [facility] Pager [phone] Voicemail [phone]

## 2023-02-09 ENCOUNTER — Ambulatory Visit: Payer: Medicare HMO | Attending: Cardiology

## 2023-02-09 DIAGNOSIS — I10 Essential (primary) hypertension: Secondary | ICD-10-CM | POA: Diagnosis not present

## 2023-02-10 ENCOUNTER — Encounter: Payer: Self-pay | Admitting: General Practice

## 2023-02-10 LAB — BASIC METABOLIC PANEL
BUN/Creatinine Ratio: 16 (ref 12–28)
BUN: 17 mg/dL (ref 8–27)
CO2: 20 mmol/L (ref 20–29)
Calcium: 9.3 mg/dL (ref 8.7–10.3)
Chloride: 106 mmol/L (ref 96–106)
Creatinine, Ser: 1.08 mg/dL — ABNORMAL HIGH (ref 0.57–1.00)
Glucose: 102 mg/dL — ABNORMAL HIGH (ref 70–99)
Potassium: 4.5 mmol/L (ref 3.5–5.2)
Sodium: 138 mmol/L (ref 134–144)
eGFR: 57 mL/min/{1.73_m2} — ABNORMAL LOW (ref >59)

## 2023-02-10 NOTE — Progress Notes (Signed)
[facility] Spiritual Care Note  Attempted follow-up by phone, leaving voicemail with availability, direct number, and encouragement to return call.   [address] Rush Barer, [place], [facility] Pager [phone] Voicemail [phone]

## 2023-02-16 DIAGNOSIS — C50512 Malignant neoplasm of lower-outer quadrant of left female breast: Secondary | ICD-10-CM | POA: Diagnosis not present

## 2023-02-16 DIAGNOSIS — Z510 Encounter for antineoplastic radiation therapy: Secondary | ICD-10-CM | POA: Diagnosis not present

## 2023-02-16 DIAGNOSIS — Z170 Estrogen receptor positive status [ER+]: Secondary | ICD-10-CM | POA: Diagnosis not present

## 2023-02-16 DIAGNOSIS — C773 Secondary and unspecified malignant neoplasm of axilla and upper limb lymph nodes: Secondary | ICD-10-CM | POA: Diagnosis not present

## 2023-02-17 ENCOUNTER — Other Ambulatory Visit: Payer: Self-pay

## 2023-02-17 ENCOUNTER — Ambulatory Visit
Admission: RE | Admit: 2023-02-17 | Discharge: 2023-02-17 | Disposition: A | Discharge status: Home / Self Care | Payer: Medicare HMO | Source: Ambulatory Visit | Attending: Radiation Oncology | Admitting: Radiation Oncology

## 2023-02-17 DIAGNOSIS — Z170 Estrogen receptor positive status [ER+]: Secondary | ICD-10-CM | POA: Diagnosis not present

## 2023-02-17 DIAGNOSIS — Z510 Encounter for antineoplastic radiation therapy: Secondary | ICD-10-CM | POA: Diagnosis not present

## 2023-02-17 DIAGNOSIS — C50512 Malignant neoplasm of lower-outer quadrant of left female breast: Secondary | ICD-10-CM | POA: Diagnosis not present

## 2023-02-17 DIAGNOSIS — C773 Secondary and unspecified malignant neoplasm of axilla and upper limb lymph nodes: Secondary | ICD-10-CM | POA: Diagnosis not present

## 2023-02-17 LAB — RAD ONC ARIA SESSION SUMMARY

## 2023-02-18 ENCOUNTER — Ambulatory Visit
Admission: RE | Admit: 2023-02-18 | Discharge: 2023-02-18 | Disposition: A | Discharge status: Home / Self Care | Payer: Medicare HMO | Source: Ambulatory Visit | Attending: Radiation Oncology | Admitting: Radiation Oncology

## 2023-02-18 ENCOUNTER — Other Ambulatory Visit: Payer: Self-pay

## 2023-02-18 DIAGNOSIS — C773 Secondary and unspecified malignant neoplasm of axilla and upper limb lymph nodes: Secondary | ICD-10-CM | POA: Diagnosis not present

## 2023-02-18 DIAGNOSIS — C50512 Malignant neoplasm of lower-outer quadrant of left female breast: Secondary | ICD-10-CM | POA: Diagnosis not present

## 2023-02-18 DIAGNOSIS — C50312 Malignant neoplasm of lower-inner quadrant of left female breast: Secondary | ICD-10-CM

## 2023-02-18 DIAGNOSIS — Z170 Estrogen receptor positive status [ER+]: Secondary | ICD-10-CM | POA: Diagnosis not present

## 2023-02-18 DIAGNOSIS — Z510 Encounter for antineoplastic radiation therapy: Secondary | ICD-10-CM | POA: Diagnosis not present

## 2023-02-18 LAB — RAD ONC ARIA SESSION SUMMARY

## 2023-02-18 MED ORDER — RADIAPLEXRX EX GEL: Freq: Once | CUTANEOUS | Status: AC

## 2023-02-21 ENCOUNTER — Other Ambulatory Visit: Payer: Self-pay

## 2023-02-21 ENCOUNTER — Ambulatory Visit
Admission: RE | Admit: 2023-02-21 | Discharge: 2023-02-21 | Disposition: A | Discharge status: Home / Self Care | Payer: Medicare HMO | Source: Ambulatory Visit | Attending: Radiation Oncology | Admitting: Radiation Oncology

## 2023-02-21 DIAGNOSIS — Z170 Estrogen receptor positive status [ER+]: Secondary | ICD-10-CM | POA: Diagnosis not present

## 2023-02-21 DIAGNOSIS — Z510 Encounter for antineoplastic radiation therapy: Secondary | ICD-10-CM | POA: Diagnosis not present

## 2023-02-21 DIAGNOSIS — C773 Secondary and unspecified malignant neoplasm of axilla and upper limb lymph nodes: Secondary | ICD-10-CM | POA: Diagnosis not present

## 2023-02-21 DIAGNOSIS — C50512 Malignant neoplasm of lower-outer quadrant of left female breast: Secondary | ICD-10-CM | POA: Diagnosis not present

## 2023-02-21 LAB — RAD ONC ARIA SESSION SUMMARY
Course Elapsed Days: 4
Plan Fractions Treated to Date: 3
Plan Fractions Treated to Date: 3
Plan Prescribed Dose Per Fraction: 1.8 Gy
Plan Prescribed Dose Per Fraction: 1.8 Gy
Plan Total Fractions Prescribed: 28
Plan Total Fractions Prescribed: 28
Plan Total Prescribed Dose: 50.4 Gy
Plan Total Prescribed Dose: 50.4 Gy
Reference Point Dosage Given to Date: 5.4 Gy
Reference Point Dosage Given to Date: 5.4 Gy
Reference Point Session Dosage Given: 1.8 Gy
Reference Point Session Dosage Given: 1.8 Gy
Session Number: 3

## 2023-02-22 ENCOUNTER — Ambulatory Visit: Payer: Medicare HMO | Attending: Surgery

## 2023-02-22 ENCOUNTER — Ambulatory Visit: Payer: Medicare HMO

## 2023-02-22 VITALS — Wt 276.4 lb

## 2023-02-22 DIAGNOSIS — Z483 Aftercare following surgery for neoplasm: Secondary | ICD-10-CM | POA: Insufficient documentation

## 2023-02-22 NOTE — Therapy (Signed)
OUTPATIENT PHYSICAL THERAPY SOZO SCREENING NOTE   Patient Name: Pamela Davenport MRN: [id number] DOB:26-Dec-1955, 67 y.o., female Today's Date: 02/22/2023  PCP: [name], MD REFERRING PROVIDER: Harriette Bouillon, MD   PT End of Session - 02/22/23 1215     Visit Number 2   # unchanged due to screen only   PT Start Time 1202    PT Stop Time 1216    PT Time Calculation (min) 14 min    Activity Tolerance Patient tolerated treatment well    Behavior During Therapy [facility] for tasks assessed/performed             Past Medical History:  Diagnosis Date   Atypical chest pain    normal myoview 11/11/10   Blood transfusion without reported diagnosis    DVT (deep venous thrombosis) (HCC)    recurrent; chronically anticoagulated with coumadin   Dysrhythmia    Family history of anesthesia complication    Sister had decrease in respirations after surgery   Heart murmur    Herniated lumbar intervertebral disc    Hypertension    Paroxysmal atrial fibrillation (HCC)    SINCE AGE 6    PE (pulmonary embolism)    chronically anticoagulated with coumadin   Pneumonia 07/20/2012   PONV (postoperative nausea and vomiting)    Rheumatoid arthritis (HCC)    Sickle cell trait (HCC)    Sinus congestion 08/14/2018   C/O NASAL/CHEST  CONGESTION, COUGHING WITH PHLEGM, PER PATIENT , SHE IS TO BEGIN TAKING AUGMENTIN DOSE PACK TODAY;     Strep throat at age 26   The patient reports a severe strep throat infection at age 61 and   is not clear as to whether or not she may have had rheumatic fever.   Symptomatic premature ventricular contractions    improved s/p ablation   Urgency of urination    Uterine bleeding 2008   Uterine artery embolization in 2008 for uterine bleeding.    Past Surgical History:  Procedure Laterality Date   BREAST BIOPSY Left 11/08/2022   [place] LT BREAST BX W LOC DEV EA ADD LESION IMG BX SPEC US GUIDE 11/08/2022 [facility]   BREAST BIOPSY Left 11/08/2022   [place] LT BREAST  BX W LOC DEV 1ST LESION IMG BX SPEC US GUIDE 11/08/2022 [facility]   BREAST BIOPSY Left 12/17/2022   [place] LT RADIOACTIVE SEED LOC 12/17/2022 [facility]   BREAST BIOPSY Left 12/17/2022   [place] LT RADIOACTIVE SEED EA ADD LESION 12/17/2022 [facility]   BREAST BIOPSY Left 12/17/2022   [place] LT RADIOACTIVE SEED EA ADD LESION 12/17/2022 [facility]   BREAST LUMPECTOMY WITH RADIOACTIVE SEED AND SENTINEL LYMPH NODE BIOPSY Left 12/21/2022   Procedure: LEFT BREAST BRACKETED SEED LUMPECTOMY AND LEFT SENTINEL LYMPH NODE MAPPING;  Surgeon: Harriette Bouillon, MD;  Location: [facility];  Service: General;  Laterality: Left;  GEN & PEC BLOCK   COLONOSCOPY W/ POLYPECTOMY     Diagnostic D&C hysteroscopy and Novasure ablation  03/17/2004   DILATION AND CURETTAGE OF UTERUS  12/17/2011   Procedure: DILATATION AND CURETTAGE;  Surgeon: Kathreen Cosier, MD;  Location: [facility];  Service: Gynecology;  Laterality: N/A;  With Attempted Hydrothermal Ablation   LUMBAR LAMINECTOMY/DECOMPRESSION MICRODISCECTOMY Left 02/21/2013   Procedure: LUMBAR LAMINECTOMY/DECOMPRESSION MICRODISCECTOMY 1 LEVEL;  Surgeon: Carmela Hurt, MD;  Location: [facility];  Service: Neurosurgery;  Laterality: Left;  LEFT Lumbar Three-Four Laminotomy foraminotomy microdiskectomy   MANDIBLE SURGERY     under  bite   PVC Ablation  2010   ROBOTIC ASSISTED TOTAL HYSTERECTOMY WITH BILATERAL SALPINGO OOPHERECTOMY Bilateral 08/21/2018   Procedure: XI ROBOTIC ASSISTED TOTAL HYSTERECTOMY WITH BILATERAL SALPINGO OOPHORECTOMY;  Surgeon: Adolphus Birchwood, MD;  Location: [facility];  Service: Gynecology;  Laterality: Bilateral;   SPINE SURGERY     TONSILLECTOMY     UTERINE ARTERY EMBOLIZATION  2008   for uterine bleeding   VASCULAR SURGERY     laser of left leg   Patient Active Problem List   Diagnosis Date Noted   Genetic testing 11/30/2022   Family history of prostate cancer 11/19/2022   Malignant neoplasm of  lower-inner quadrant of left breast in female, estrogen receptor positive (HCC) 11/15/2022   Anemia due to acquired thiamine deficiency 05/07/2022   Deficiency anemia 05/04/2022   Gastroesophageal reflux disease with esophagitis without hemorrhage 05/04/2022   Esophageal dysphagia 05/04/2022   Intermittent palpitations 05/04/2022   OAB (overactive bladder) 12/31/2021   Encounter for general adult medical examination with abnormal findings 08/26/2021   Obesity, morbid, BMI 40.0-49.9 (HCC) 08/26/2021   PAF (paroxysmal atrial fibrillation) (HCC) 08/26/2021   GERD without esophagitis 10/04/2019   Vitamin D deficiency disease 09/25/2019   Hypercoagulable state (HCC) 09/24/2019   Iron deficiency anemia 12/07/2018   Other dietary vitamin B12 deficiency anemia 12/07/2018   Visit for screening mammogram 04/18/2017   Hyperlipidemia with target LDL less than 130 10/19/2016   Essential hypertension 01/13/2015   Chronic pulmonary embolism (HCC) 12/05/2008    REFERRING DIAG: left breast cancer at risk for lymphedema  THERAPY DIAG: Aftercare following surgery for neoplasm  PERTINENT HISTORY: Patient was diagnosed on 11/09/2022 with left grade 1 invasive ductal carcinoma breast cancer. She underwent a left lumpectomy and sentinel node biopsy on 12/21/2022. It is ER/PR positive and HER2 negative with a Ki67 of 10%.   PRECAUTIONS: left UE Lymphedema risk, None  SUBJECTIVE: Pt returns for her first 3 month L-Dex screen. "I had a seroma that Dr. Rosezena Sensor office drained one time and then they cleared me to start radiation. I've had 4 so far. And that's going fine."   PAIN:  Are you having pain? No  SOZO SCREENING: Patient was assessed today using the SOZO machine to determine the lymphedema index score. This was compared to her baseline score. It was determined that she is within the recommended range when compared to her baseline and no further action is needed at this time. She will continue SOZO  screenings. These are done every 3 months for 2 years post operatively followed by every 6 months for 2 years, and then annually.  Pt has just recently started radiation and she had questions about when she was expected to wear her compression bra, exs during and what to expect of skin changes during radiation. Answered pts questions within scope of practice.    L-DEX FLOWSHEETS - 02/22/23 1200       L-DEX LYMPHEDEMA SCREENING   Measurement Type Unilateral    L-DEX MEASUREMENT EXTREMITY Upper Extremity    POSITION  Standing    DOMINANT SIDE Right    At Risk Side Left    BASELINE SCORE (UNILATERAL) 1.3    L-DEX SCORE (UNILATERAL) 3.3    VALUE CHANGE (UNILAT) 2              Hermenia Bers, PTA 02/22/2023, 12:17 PM

## 2023-02-23 ENCOUNTER — Other Ambulatory Visit: Payer: Self-pay

## 2023-02-23 ENCOUNTER — Ambulatory Visit
Admission: RE | Admit: 2023-02-23 | Discharge: 2023-02-23 | Disposition: A | Discharge status: Home / Self Care | Payer: Medicare HMO | Source: Ambulatory Visit | Attending: Radiation Oncology | Admitting: Radiation Oncology

## 2023-02-23 DIAGNOSIS — C50512 Malignant neoplasm of lower-outer quadrant of left female breast: Secondary | ICD-10-CM | POA: Diagnosis not present

## 2023-02-23 DIAGNOSIS — C773 Secondary and unspecified malignant neoplasm of axilla and upper limb lymph nodes: Secondary | ICD-10-CM | POA: Diagnosis not present

## 2023-02-23 DIAGNOSIS — Z510 Encounter for antineoplastic radiation therapy: Secondary | ICD-10-CM | POA: Diagnosis not present

## 2023-02-23 DIAGNOSIS — Z170 Estrogen receptor positive status [ER+]: Secondary | ICD-10-CM | POA: Diagnosis not present

## 2023-02-23 LAB — RAD ONC ARIA SESSION SUMMARY
Course Elapsed Days: 6
Plan Fractions Treated to Date: 4
Plan Fractions Treated to Date: 4
Plan Prescribed Dose Per Fraction: 1.8 Gy
Plan Prescribed Dose Per Fraction: 1.8 Gy
Plan Total Fractions Prescribed: 28
Plan Total Fractions Prescribed: 28
Plan Total Prescribed Dose: 50.4 Gy
Plan Total Prescribed Dose: 50.4 Gy
Reference Point Dosage Given to Date: 7.2 Gy
Reference Point Dosage Given to Date: 7.2 Gy
Reference Point Session Dosage Given: 1.8 Gy
Reference Point Session Dosage Given: 1.8 Gy
Session Number: 4

## 2023-02-24 ENCOUNTER — Ambulatory Visit
Admission: RE | Admit: 2023-02-24 | Discharge: 2023-02-24 | Disposition: A | Discharge status: Home / Self Care | Payer: Medicare HMO | Source: Ambulatory Visit | Attending: Radiation Oncology | Admitting: Radiation Oncology

## 2023-02-24 ENCOUNTER — Other Ambulatory Visit: Payer: Self-pay

## 2023-02-24 DIAGNOSIS — C773 Secondary and unspecified malignant neoplasm of axilla and upper limb lymph nodes: Secondary | ICD-10-CM | POA: Diagnosis not present

## 2023-02-24 DIAGNOSIS — Z510 Encounter for antineoplastic radiation therapy: Secondary | ICD-10-CM | POA: Diagnosis not present

## 2023-02-24 DIAGNOSIS — Z170 Estrogen receptor positive status [ER+]: Secondary | ICD-10-CM | POA: Diagnosis not present

## 2023-02-24 DIAGNOSIS — C50512 Malignant neoplasm of lower-outer quadrant of left female breast: Secondary | ICD-10-CM | POA: Diagnosis not present

## 2023-02-24 LAB — RAD ONC ARIA SESSION SUMMARY
Course Elapsed Days: 7
Plan Fractions Treated to Date: 5
Plan Fractions Treated to Date: 5
Plan Prescribed Dose Per Fraction: 1.8 Gy
Plan Prescribed Dose Per Fraction: 1.8 Gy
Plan Total Fractions Prescribed: 28
Plan Total Fractions Prescribed: 28
Plan Total Prescribed Dose: 50.4 Gy
Plan Total Prescribed Dose: 50.4 Gy
Reference Point Dosage Given to Date: 9 Gy
Reference Point Dosage Given to Date: 9 Gy
Reference Point Session Dosage Given: 1.8 Gy
Reference Point Session Dosage Given: 1.8 Gy
Session Number: 5

## 2023-02-25 ENCOUNTER — Ambulatory Visit
Admission: RE | Admit: 2023-02-25 | Discharge: 2023-02-25 | Disposition: A | Discharge status: Home / Self Care | Payer: Medicare HMO | Source: Ambulatory Visit | Attending: Radiation Oncology | Admitting: Radiation Oncology

## 2023-02-25 ENCOUNTER — Ambulatory Visit: Payer: Medicare HMO | Attending: Cardiovascular Disease | Admitting: Pharmacist

## 2023-02-25 ENCOUNTER — Other Ambulatory Visit: Payer: Self-pay

## 2023-02-25 VITALS — BP 144/78 | HR 69

## 2023-02-25 DIAGNOSIS — C50512 Malignant neoplasm of lower-outer quadrant of left female breast: Secondary | ICD-10-CM | POA: Diagnosis not present

## 2023-02-25 DIAGNOSIS — Z170 Estrogen receptor positive status [ER+]: Secondary | ICD-10-CM | POA: Diagnosis not present

## 2023-02-25 DIAGNOSIS — C773 Secondary and unspecified malignant neoplasm of axilla and upper limb lymph nodes: Secondary | ICD-10-CM | POA: Diagnosis not present

## 2023-02-25 DIAGNOSIS — I10 Essential (primary) hypertension: Secondary | ICD-10-CM | POA: Diagnosis not present

## 2023-02-25 DIAGNOSIS — Z510 Encounter for antineoplastic radiation therapy: Secondary | ICD-10-CM | POA: Diagnosis not present

## 2023-02-25 LAB — RAD ONC ARIA SESSION SUMMARY
Course Elapsed Days: 8
Plan Fractions Treated to Date: 6
Plan Fractions Treated to Date: 6
Plan Prescribed Dose Per Fraction: 1.8 Gy
Plan Prescribed Dose Per Fraction: 1.8 Gy
Plan Total Fractions Prescribed: 28
Plan Total Fractions Prescribed: 28
Plan Total Prescribed Dose: 50.4 Gy
Plan Total Prescribed Dose: 50.4 Gy
Reference Point Dosage Given to Date: 10.8 Gy
Reference Point Dosage Given to Date: 10.8 Gy
Reference Point Session Dosage Given: 1.8 Gy
Reference Point Session Dosage Given: 1.8 Gy
Session Number: 6

## 2023-02-25 NOTE — Patient Instructions (Signed)
Your blood pressure goal is excellent at goal < 130/64mmHg   Continue taking your current medications  Important lifestyle changes to control high blood pressure  Intervention  Effect on the BP   Weight loss Weight loss is one of the most effective lifestyle changes for controlling blood pressure. If you're overweight or obese, losing even a small amount of weight can help reduce blood pressure.    Blood pressure can decrease by 1 millimeter of mercury (mmHg) with each kilogram (about 2.2 pounds) of weight lost.   Exercise regularly As a general goal, aim for 30 minutes of moderate physical activity every day.    Regular physical activity can lower blood pressure by 5 - 8 mmHg.   Eat a healthy diet Eat a diet rich in whole grains, fruits, vegetables, lean meat, and low-fat dairy products. Limit processed foods, saturated fat, and sweets.    A heart-healthy diet can lower high blood pressure by 10 mmHg.   Reduce salt (sodium) in your diet Aim for 000mg  of sodium each day. Avoid deli meats, canned food, and frozen microwave meals which are high in sodium.     Limiting sodium can reduce blood pressure by 5 mmHg.   Limit alcohol One drink equals 12 ounces of beer, 5 ounces of wine, or 1.5 ounces of 80-proof liquor.    Limiting alcohol to < 1 drink a day for women or < 2 drinks a day for men can help lower blood pressure by about 4 mmHg.   To check your pressure at home you will need to:   Sit up in a chair, with feet flat on the floor and back supported. Do not cross your ankles or legs. Rest your left arm so that the cuff is about heart level. If the cuff goes on your upper arm, then just relax your arm on the table, arm of the chair, or your lap. If you have a wrist cuff, hold your wrist against your chest at heart level. Place the cuff snugly around your arm, about 1 inch above the crease of your elbow. The cords should be inside the groove of your elbow.  Sit quietly, with  the cuff in place, for about 5 minutes. Then press the power button to start a reading. Do not talk or move while the reading is taking place.  Record your readings on a sheet of paper. Although most cuffs have a memory, it is often easier to see a pattern developing when the numbers are all in front of you.  You can repeat the reading after 1-3 minutes if it is recommended.   Make sure your bladder is empty and you have not had caffeine or tobacco within the last 30 minutes   Always bring your blood pressure log with you to your appointments. If you have not brought your monitor in to be double checked for accuracy, please bring it to your next appointment.   You can find a list of validated (accurate) blood pressure cuffs at: validatebp.org

## 2023-02-25 NOTE — Progress Notes (Unsigned)
Patient ID: Pamela Davenport                 DOB: 1955/10/16                      MRN: [id number]     HPI: Pamela Davenport is a 67 y.o. female referred by Dr. Lalla Brothers to HTN clinic. PMH is significant for recurrent DVT/PE, sickle cell trait, afib, RA, and HTN. Her BP was well controlled at visit with Dr Lalla Brothers on 12/31/22 at 118/74 however pt expressed concern over worsening renal function on indapamide. She was advised to stop her indapamide and was started on amlodipine, with PharmD f/u scheduled. She saw PharmD on 5/30 where BP was 138/80. Home BPs trending 140-150/70-80. Her losartan was changed to irbesartan and she presents today for follow up. Of note, her SCr has improved from 1.28 to 1.16 and then 1.08 most recently after stopping her indapamide.  Pt presents today for follow up. Thinks she is tolerating her meds well, hard to tell as she is undergoing radiation for breast cancer 5 days a week so she does have some fatigue. Cancer diagnosis was in March after she retired in February. Brings in detailed log of home BP readings checked twice daily in the AM and PM. PM readings before she takes her meds (all at 6pm). Sits for 5-10 mins before checking BP. AM readings that were a bit lower were after walking in the park. Avoids NSAIDs. Has been making healthier diet choices and increasing walking over the past few months. Of note, check BP on right arm due to surgery on the left side.   Her urologist changed her from Ditropan to Vesicare - was having a lot of anticholinergic effects on Ditropan like dry eyes and dry mouth/trouble swallowing. Tolerating Vesicare much better. Myrbetriq and Gemtesa previously elevated her BP to the 200 range, even with Myrbetriq rechallenge.  Current HTN meds: amlodipine 10mg  daily, irbesartan 300mg  daily, nadolol 20mg  daily (afib) Previously tried: indapamide 1.25mg  - increase in SCr BP goal: <130/80mmHg  Family History: Mother with HTN and T2DM.  Social History:  Former tobacco use, quit 40 years ago. No drug or alcohol use.  Diet: low sodium diet, cut back on soda, junk food, and fast food, drinking more water, eating more vegetables.  Exercise: 30 minute walk daily.  Home BP readings:    Wt Readings from Last 3 Encounters:  02/22/23 276 lb 6 oz (125.4 kg)  01/19/23 281 lb 6 oz (127.6 kg)  01/04/23 275 lb 9.6 oz (125 kg)   BP Readings from Last 3 Encounters:  01/27/23 138/80  01/19/23 130/62  01/04/23 132/72   Pulse Readings from Last 3 Encounters:  01/27/23 66  01/19/23 64  01/04/23 75    Renal function: Estimated Creatinine Clearance: 72.7 mL/min (A) (by C-G formula based on SCr of 1.08 mg/dL (H)).  Past Medical History:  Diagnosis Date   Atypical chest pain    normal myoview 11/11/10   Blood transfusion without reported diagnosis    DVT (deep venous thrombosis) (HCC)    recurrent; chronically anticoagulated with coumadin   Dysrhythmia    Family history of anesthesia complication    Sister had decrease in respirations after surgery   Heart murmur    Herniated lumbar intervertebral disc    Hypertension    Paroxysmal atrial fibrillation (HCC)    SINCE AGE 34    PE (pulmonary embolism)    chronically anticoagulated with  coumadin   Pneumonia 07/20/2012   PONV (postoperative nausea and vomiting)    Rheumatoid arthritis (HCC)    Sickle cell trait (HCC)    Sinus congestion 08/14/2018   C/O NASAL/CHEST  CONGESTION, COUGHING WITH PHLEGM, PER PATIENT , SHE IS TO BEGIN TAKING AUGMENTIN DOSE PACK TODAY;     Strep throat at age 31   The patient reports a severe strep throat infection at age 33 and   is not clear as to whether or not she may have had rheumatic fever.   Symptomatic premature ventricular contractions    improved s/p ablation   Urgency of urination    Uterine bleeding 2008   Uterine artery embolization in 2008 for uterine bleeding.     Current Outpatient Medications on File Prior to Visit  Medication Sig Dispense  Refill   ALPRAZolam (XANAX) 0.5 MG tablet Take 1 tablet (0.5 mg total) by mouth 2 (two) times daily. 60 tablet 2   amLODipine (NORVASC) 10 MG tablet Take 1 tablet (10 mg total) by mouth at bedtime. 30 tablet 6   esomeprazole (NEXIUM) 40 MG capsule Take 1 capsule (40 mg total) by mouth daily. 90 capsule 0   flecainide (TAMBOCOR) 150 MG tablet Take 2 tablets by mouth at the onset of AFIB 30 tablet 3   hydroxychloroquine (PLAQUENIL) 200 MG tablet Take 1 tablet (200 mg total) by mouth 2 (two) times daily. 180 tablet 0   hydrOXYzine (ATARAX) 25 MG tablet Take 1 tablet (25 mg total) by mouth 3 (three) times daily as needed. 30 tablet 2   irbesartan (AVAPRO) 300 MG tablet Take 1 tablet (300 mg total) by mouth daily. 90 tablet 3   nadolol (CORGARD) 20 MG tablet Take 1 tablet (20 mg total) by mouth daily. 90 tablet 3   nystatin-triamcinolone ointment (MYCOLOG) Apply to affected areas two times a day 20 g 2   oxybutynin (DITROPAN-XL) 10 MG 24 hr tablet Take 10 mg by mouth at bedtime.     rivaroxaban (XARELTO) 20 MG TABS tablet Take 1 tablet (20 mg total) by mouth daily with supper. 90 tablet 3   TART CHERRY PO Take 2 tablets by mouth daily.     thiamine (VITAMIN B-1) 50 MG tablet Take 1 tablet (50 mg total) by mouth daily. 90 tablet 1   No current facility-administered medications on file prior to visit.    Allergies  Allergen Reactions   Methylprednisolone     REACTION: Atrial fibrillation at end of medrol dose pack   Sulfa Antibiotics Hives   Sulfonamide Derivatives Hives   Macrobid [Nitrofurantoin]     Muscle tightening and cramps      Assessment/Plan:  1. Hypertension - Home BP readings are excellent and all at goal < 130/70mmHg. Unsure why BP is elevated in office today, was normal at home this morning. Confirmed accuracy of her home BP cuff as well. Will continue amlodipine 10mg  daily and irbesartan 300mg  daily. Pt will call with any concerns. F/u with PharmD as needed.  Thea Holshouser E. Abrar Koone,  PharmD, BCACP, CPP [facility] [address], [place], [place] [address] Phone: [phone]; Fax: [phone] 02/25/2023 10:53 AM

## 2023-02-28 ENCOUNTER — Other Ambulatory Visit: Payer: Self-pay

## 2023-02-28 ENCOUNTER — Ambulatory Visit
Admission: RE | Admit: 2023-02-28 | Discharge: 2023-02-28 | Disposition: A | Discharge status: Home / Self Care | Payer: Medicare HMO | Source: Ambulatory Visit | Attending: Radiation Oncology | Admitting: Radiation Oncology

## 2023-02-28 DIAGNOSIS — Z170 Estrogen receptor positive status [ER+]: Secondary | ICD-10-CM | POA: Insufficient documentation

## 2023-02-28 DIAGNOSIS — C50512 Malignant neoplasm of lower-outer quadrant of left female breast: Secondary | ICD-10-CM | POA: Insufficient documentation

## 2023-02-28 DIAGNOSIS — C773 Secondary and unspecified malignant neoplasm of axilla and upper limb lymph nodes: Secondary | ICD-10-CM | POA: Diagnosis not present

## 2023-02-28 DIAGNOSIS — Z510 Encounter for antineoplastic radiation therapy: Secondary | ICD-10-CM | POA: Insufficient documentation

## 2023-02-28 LAB — RAD ONC ARIA SESSION SUMMARY
Course Elapsed Days: 11
Plan Fractions Treated to Date: 7
Plan Fractions Treated to Date: 7
Plan Prescribed Dose Per Fraction: 1.8 Gy
Plan Prescribed Dose Per Fraction: 1.8 Gy
Plan Total Fractions Prescribed: 28
Plan Total Fractions Prescribed: 28
Plan Total Prescribed Dose: 50.4 Gy
Plan Total Prescribed Dose: 50.4 Gy
Reference Point Dosage Given to Date: 12.6 Gy
Reference Point Dosage Given to Date: 12.6 Gy
Reference Point Session Dosage Given: 1.8 Gy
Reference Point Session Dosage Given: 1.8 Gy
Session Number: 7

## 2023-03-01 ENCOUNTER — Ambulatory Visit
Admission: RE | Admit: 2023-03-01 | Discharge: 2023-03-01 | Disposition: A | Discharge status: Home / Self Care | Payer: Medicare HMO | Source: Ambulatory Visit | Attending: Radiation Oncology | Admitting: Radiation Oncology

## 2023-03-01 ENCOUNTER — Other Ambulatory Visit: Payer: Self-pay

## 2023-03-01 DIAGNOSIS — C50512 Malignant neoplasm of lower-outer quadrant of left female breast: Secondary | ICD-10-CM | POA: Diagnosis not present

## 2023-03-01 DIAGNOSIS — Z170 Estrogen receptor positive status [ER+]: Secondary | ICD-10-CM | POA: Diagnosis not present

## 2023-03-01 DIAGNOSIS — Z510 Encounter for antineoplastic radiation therapy: Secondary | ICD-10-CM | POA: Diagnosis not present

## 2023-03-01 DIAGNOSIS — C773 Secondary and unspecified malignant neoplasm of axilla and upper limb lymph nodes: Secondary | ICD-10-CM | POA: Diagnosis not present

## 2023-03-01 LAB — RAD ONC ARIA SESSION SUMMARY
Course Elapsed Days: 12
Plan Fractions Treated to Date: 8
Plan Fractions Treated to Date: 8
Plan Prescribed Dose Per Fraction: 1.8 Gy
Plan Prescribed Dose Per Fraction: 1.8 Gy
Plan Total Fractions Prescribed: 28
Plan Total Fractions Prescribed: 28
Plan Total Prescribed Dose: 50.4 Gy
Plan Total Prescribed Dose: 50.4 Gy
Reference Point Dosage Given to Date: 14.4 Gy
Reference Point Dosage Given to Date: 14.4 Gy
Reference Point Session Dosage Given: 1.8 Gy
Reference Point Session Dosage Given: 1.8 Gy
Session Number: 8

## 2023-03-02 ENCOUNTER — Ambulatory Visit
Admission: RE | Admit: 2023-03-02 | Discharge: 2023-03-02 | Disposition: A | Discharge status: Home / Self Care | Payer: Medicare HMO | Source: Ambulatory Visit | Attending: Radiation Oncology | Admitting: Radiation Oncology

## 2023-03-02 ENCOUNTER — Other Ambulatory Visit: Payer: Self-pay

## 2023-03-02 DIAGNOSIS — C773 Secondary and unspecified malignant neoplasm of axilla and upper limb lymph nodes: Secondary | ICD-10-CM | POA: Diagnosis not present

## 2023-03-02 DIAGNOSIS — Z510 Encounter for antineoplastic radiation therapy: Secondary | ICD-10-CM | POA: Diagnosis not present

## 2023-03-02 DIAGNOSIS — Z170 Estrogen receptor positive status [ER+]: Secondary | ICD-10-CM | POA: Diagnosis not present

## 2023-03-02 DIAGNOSIS — C50512 Malignant neoplasm of lower-outer quadrant of left female breast: Secondary | ICD-10-CM | POA: Diagnosis not present

## 2023-03-02 LAB — RAD ONC ARIA SESSION SUMMARY
Course Elapsed Days: 13
Plan Fractions Treated to Date: 9
Plan Fractions Treated to Date: 9
Plan Prescribed Dose Per Fraction: 1.8 Gy
Plan Prescribed Dose Per Fraction: 1.8 Gy
Plan Total Fractions Prescribed: 28
Plan Total Fractions Prescribed: 28
Plan Total Prescribed Dose: 50.4 Gy
Plan Total Prescribed Dose: 50.4 Gy
Reference Point Dosage Given to Date: 16.2 Gy
Reference Point Dosage Given to Date: 16.2 Gy
Reference Point Session Dosage Given: 1.8 Gy
Reference Point Session Dosage Given: 1.8 Gy
Session Number: 9

## 2023-03-04 ENCOUNTER — Ambulatory Visit: Payer: Medicare HMO

## 2023-03-04 ENCOUNTER — Other Ambulatory Visit: Payer: Self-pay

## 2023-03-04 ENCOUNTER — Ambulatory Visit
Admission: RE | Admit: 2023-03-04 | Discharge: 2023-03-04 | Disposition: A | Discharge status: Home / Self Care | Payer: Medicare HMO | Source: Ambulatory Visit | Attending: Radiation Oncology | Admitting: Radiation Oncology

## 2023-03-04 DIAGNOSIS — C50512 Malignant neoplasm of lower-outer quadrant of left female breast: Secondary | ICD-10-CM | POA: Diagnosis not present

## 2023-03-04 DIAGNOSIS — Z510 Encounter for antineoplastic radiation therapy: Secondary | ICD-10-CM | POA: Diagnosis not present

## 2023-03-04 DIAGNOSIS — Z170 Estrogen receptor positive status [ER+]: Secondary | ICD-10-CM | POA: Diagnosis not present

## 2023-03-04 DIAGNOSIS — C773 Secondary and unspecified malignant neoplasm of axilla and upper limb lymph nodes: Secondary | ICD-10-CM | POA: Diagnosis not present

## 2023-03-04 LAB — RAD ONC ARIA SESSION SUMMARY
Course Elapsed Days: 15
Plan Fractions Treated to Date: 10
Plan Fractions Treated to Date: 10
Plan Prescribed Dose Per Fraction: 1.8 Gy
Plan Prescribed Dose Per Fraction: 1.8 Gy
Plan Total Fractions Prescribed: 28
Plan Total Fractions Prescribed: 28
Plan Total Prescribed Dose: 50.4 Gy
Plan Total Prescribed Dose: 50.4 Gy
Reference Point Dosage Given to Date: 18 Gy
Reference Point Dosage Given to Date: 18 Gy
Reference Point Session Dosage Given: 1.8 Gy
Reference Point Session Dosage Given: 1.8 Gy
Session Number: 10

## 2023-03-06 DIAGNOSIS — Z510 Encounter for antineoplastic radiation therapy: Secondary | ICD-10-CM | POA: Diagnosis not present

## 2023-03-06 DIAGNOSIS — Z170 Estrogen receptor positive status [ER+]: Secondary | ICD-10-CM | POA: Diagnosis not present

## 2023-03-06 DIAGNOSIS — C773 Secondary and unspecified malignant neoplasm of axilla and upper limb lymph nodes: Secondary | ICD-10-CM | POA: Diagnosis not present

## 2023-03-06 DIAGNOSIS — C50512 Malignant neoplasm of lower-outer quadrant of left female breast: Secondary | ICD-10-CM | POA: Diagnosis not present

## 2023-03-07 ENCOUNTER — Other Ambulatory Visit: Payer: Self-pay

## 2023-03-07 ENCOUNTER — Ambulatory Visit
Admission: RE | Admit: 2023-03-07 | Discharge: 2023-03-07 | Disposition: A | Discharge status: Home / Self Care | Payer: Medicare HMO | Source: Ambulatory Visit | Attending: Radiation Oncology | Admitting: Radiation Oncology

## 2023-03-07 DIAGNOSIS — Z170 Estrogen receptor positive status [ER+]: Secondary | ICD-10-CM | POA: Diagnosis not present

## 2023-03-07 DIAGNOSIS — C773 Secondary and unspecified malignant neoplasm of axilla and upper limb lymph nodes: Secondary | ICD-10-CM | POA: Diagnosis not present

## 2023-03-07 DIAGNOSIS — Z510 Encounter for antineoplastic radiation therapy: Secondary | ICD-10-CM | POA: Diagnosis not present

## 2023-03-07 DIAGNOSIS — C50512 Malignant neoplasm of lower-outer quadrant of left female breast: Secondary | ICD-10-CM | POA: Diagnosis not present

## 2023-03-07 LAB — RAD ONC ARIA SESSION SUMMARY
Course Elapsed Days: 18
Plan Fractions Treated to Date: 1
Plan Fractions Treated to Date: 11
Plan Prescribed Dose Per Fraction: 1.8 Gy
Plan Prescribed Dose Per Fraction: 1.8 Gy
Plan Total Fractions Prescribed: 18
Plan Total Fractions Prescribed: 28
Plan Total Prescribed Dose: 32.4 Gy
Plan Total Prescribed Dose: 50.4 Gy
Reference Point Dosage Given to Date: 19.8 Gy
Reference Point Dosage Given to Date: 19.8 Gy
Reference Point Session Dosage Given: 1.8 Gy
Reference Point Session Dosage Given: 1.8 Gy
Session Number: 11

## 2023-03-08 ENCOUNTER — Ambulatory Visit
Admission: RE | Admit: 2023-03-08 | Discharge: 2023-03-08 | Disposition: A | Discharge status: Home / Self Care | Payer: Medicare HMO | Source: Ambulatory Visit | Attending: Radiation Oncology | Admitting: Radiation Oncology

## 2023-03-08 ENCOUNTER — Other Ambulatory Visit: Payer: Self-pay

## 2023-03-08 DIAGNOSIS — C773 Secondary and unspecified malignant neoplasm of axilla and upper limb lymph nodes: Secondary | ICD-10-CM | POA: Diagnosis not present

## 2023-03-08 DIAGNOSIS — C50512 Malignant neoplasm of lower-outer quadrant of left female breast: Secondary | ICD-10-CM | POA: Diagnosis not present

## 2023-03-08 DIAGNOSIS — Z510 Encounter for antineoplastic radiation therapy: Secondary | ICD-10-CM | POA: Diagnosis not present

## 2023-03-08 DIAGNOSIS — Z170 Estrogen receptor positive status [ER+]: Secondary | ICD-10-CM | POA: Diagnosis not present

## 2023-03-08 LAB — RAD ONC ARIA SESSION SUMMARY
Course Elapsed Days: 19
Plan Fractions Treated to Date: 12
Plan Fractions Treated to Date: 2
Plan Prescribed Dose Per Fraction: 1.8 Gy
Plan Prescribed Dose Per Fraction: 1.8 Gy
Plan Total Fractions Prescribed: 18
Plan Total Fractions Prescribed: 28
Plan Total Prescribed Dose: 32.4 Gy
Plan Total Prescribed Dose: 50.4 Gy
Reference Point Dosage Given to Date: 21.6 Gy
Reference Point Dosage Given to Date: 21.6 Gy
Reference Point Session Dosage Given: 1.8 Gy
Reference Point Session Dosage Given: 1.8 Gy
Session Number: 12

## 2023-03-09 ENCOUNTER — Other Ambulatory Visit: Payer: Self-pay

## 2023-03-09 ENCOUNTER — Ambulatory Visit
Admission: RE | Admit: 2023-03-09 | Discharge: 2023-03-09 | Disposition: A | Discharge status: Home / Self Care | Payer: Medicare HMO | Source: Ambulatory Visit | Attending: Radiation Oncology | Admitting: Radiation Oncology

## 2023-03-09 DIAGNOSIS — Z510 Encounter for antineoplastic radiation therapy: Secondary | ICD-10-CM | POA: Diagnosis not present

## 2023-03-09 DIAGNOSIS — C50512 Malignant neoplasm of lower-outer quadrant of left female breast: Secondary | ICD-10-CM | POA: Diagnosis not present

## 2023-03-09 DIAGNOSIS — C773 Secondary and unspecified malignant neoplasm of axilla and upper limb lymph nodes: Secondary | ICD-10-CM | POA: Diagnosis not present

## 2023-03-09 DIAGNOSIS — Z170 Estrogen receptor positive status [ER+]: Secondary | ICD-10-CM | POA: Diagnosis not present

## 2023-03-09 LAB — RAD ONC ARIA SESSION SUMMARY
Course Elapsed Days: 20
Plan Fractions Treated to Date: 13
Plan Fractions Treated to Date: 3
Plan Prescribed Dose Per Fraction: 1.8 Gy
Plan Prescribed Dose Per Fraction: 1.8 Gy
Plan Total Fractions Prescribed: 18
Plan Total Fractions Prescribed: 28
Plan Total Prescribed Dose: 32.4 Gy
Plan Total Prescribed Dose: 50.4 Gy
Reference Point Dosage Given to Date: 23.4 Gy
Reference Point Dosage Given to Date: 23.4 Gy
Reference Point Session Dosage Given: 1.8 Gy
Reference Point Session Dosage Given: 1.8 Gy
Session Number: 13

## 2023-03-10 ENCOUNTER — Other Ambulatory Visit: Payer: Self-pay

## 2023-03-10 ENCOUNTER — Ambulatory Visit
Admission: RE | Admit: 2023-03-10 | Discharge: 2023-03-10 | Disposition: A | Discharge status: Home / Self Care | Payer: Medicare HMO | Source: Ambulatory Visit | Attending: Radiation Oncology | Admitting: Radiation Oncology

## 2023-03-10 DIAGNOSIS — N3941 Urge incontinence: Secondary | ICD-10-CM | POA: Diagnosis not present

## 2023-03-10 DIAGNOSIS — Z510 Encounter for antineoplastic radiation therapy: Secondary | ICD-10-CM | POA: Diagnosis not present

## 2023-03-10 DIAGNOSIS — Z170 Estrogen receptor positive status [ER+]: Secondary | ICD-10-CM | POA: Diagnosis not present

## 2023-03-10 DIAGNOSIS — R350 Frequency of micturition: Secondary | ICD-10-CM | POA: Diagnosis not present

## 2023-03-10 DIAGNOSIS — C50512 Malignant neoplasm of lower-outer quadrant of left female breast: Secondary | ICD-10-CM | POA: Diagnosis not present

## 2023-03-10 DIAGNOSIS — C773 Secondary and unspecified malignant neoplasm of axilla and upper limb lymph nodes: Secondary | ICD-10-CM | POA: Diagnosis not present

## 2023-03-10 LAB — RAD ONC ARIA SESSION SUMMARY
Course Elapsed Days: 21
Plan Fractions Treated to Date: 14
Plan Fractions Treated to Date: 4
Plan Prescribed Dose Per Fraction: 1.8 Gy
Plan Prescribed Dose Per Fraction: 1.8 Gy
Plan Total Fractions Prescribed: 18
Plan Total Fractions Prescribed: 28
Plan Total Prescribed Dose: 32.4 Gy
Plan Total Prescribed Dose: 50.4 Gy
Reference Point Dosage Given to Date: 25.2 Gy
Reference Point Dosage Given to Date: 25.2 Gy
Reference Point Session Dosage Given: 1.8 Gy
Reference Point Session Dosage Given: 1.8 Gy
Session Number: 14

## 2023-03-11 ENCOUNTER — Other Ambulatory Visit: Payer: Self-pay

## 2023-03-11 ENCOUNTER — Ambulatory Visit
Admission: RE | Admit: 2023-03-11 | Discharge: 2023-03-11 | Disposition: A | Discharge status: Home / Self Care | Payer: Medicare HMO | Source: Ambulatory Visit | Attending: Radiation Oncology | Admitting: Radiation Oncology

## 2023-03-11 DIAGNOSIS — C50512 Malignant neoplasm of lower-outer quadrant of left female breast: Secondary | ICD-10-CM | POA: Diagnosis not present

## 2023-03-11 DIAGNOSIS — Z170 Estrogen receptor positive status [ER+]: Secondary | ICD-10-CM | POA: Diagnosis not present

## 2023-03-11 DIAGNOSIS — C773 Secondary and unspecified malignant neoplasm of axilla and upper limb lymph nodes: Secondary | ICD-10-CM | POA: Diagnosis not present

## 2023-03-11 DIAGNOSIS — Z510 Encounter for antineoplastic radiation therapy: Secondary | ICD-10-CM | POA: Diagnosis not present

## 2023-03-11 LAB — RAD ONC ARIA SESSION SUMMARY
Course Elapsed Days: 22
Plan Fractions Treated to Date: 15
Plan Fractions Treated to Date: 5
Plan Prescribed Dose Per Fraction: 1.8 Gy
Plan Prescribed Dose Per Fraction: 1.8 Gy
Plan Total Fractions Prescribed: 18
Plan Total Fractions Prescribed: 28
Plan Total Prescribed Dose: 32.4 Gy
Plan Total Prescribed Dose: 50.4 Gy
Reference Point Dosage Given to Date: 27 Gy
Reference Point Dosage Given to Date: 27 Gy
Reference Point Session Dosage Given: 1.8 Gy
Reference Point Session Dosage Given: 1.8 Gy
Session Number: 15

## 2023-03-14 ENCOUNTER — Other Ambulatory Visit: Payer: Self-pay

## 2023-03-14 ENCOUNTER — Ambulatory Visit: Admission: RE | Admit: 2023-03-14 | Payer: Medicare HMO | Source: Ambulatory Visit

## 2023-03-14 DIAGNOSIS — C50512 Malignant neoplasm of lower-outer quadrant of left female breast: Secondary | ICD-10-CM | POA: Diagnosis not present

## 2023-03-14 DIAGNOSIS — C773 Secondary and unspecified malignant neoplasm of axilla and upper limb lymph nodes: Secondary | ICD-10-CM | POA: Diagnosis not present

## 2023-03-14 DIAGNOSIS — Z510 Encounter for antineoplastic radiation therapy: Secondary | ICD-10-CM | POA: Diagnosis not present

## 2023-03-14 DIAGNOSIS — Z170 Estrogen receptor positive status [ER+]: Secondary | ICD-10-CM | POA: Diagnosis not present

## 2023-03-14 LAB — RAD ONC ARIA SESSION SUMMARY
Course Elapsed Days: 25
Plan Fractions Treated to Date: 16
Plan Fractions Treated to Date: 6
Plan Prescribed Dose Per Fraction: 1.8 Gy
Plan Prescribed Dose Per Fraction: 1.8 Gy
Plan Total Fractions Prescribed: 18
Plan Total Fractions Prescribed: 28
Plan Total Prescribed Dose: 32.4 Gy
Plan Total Prescribed Dose: 50.4 Gy
Reference Point Dosage Given to Date: 28.8 Gy
Reference Point Dosage Given to Date: 28.8 Gy
Reference Point Session Dosage Given: 1.8 Gy
Reference Point Session Dosage Given: 1.8 Gy
Session Number: 16

## 2023-03-15 ENCOUNTER — Other Ambulatory Visit: Payer: Self-pay

## 2023-03-15 ENCOUNTER — Ambulatory Visit
Admission: RE | Admit: 2023-03-15 | Discharge: 2023-03-15 | Disposition: A | Discharge status: Home / Self Care | Payer: Medicare HMO | Source: Ambulatory Visit | Attending: Radiation Oncology | Admitting: Radiation Oncology

## 2023-03-15 DIAGNOSIS — C50512 Malignant neoplasm of lower-outer quadrant of left female breast: Secondary | ICD-10-CM | POA: Diagnosis not present

## 2023-03-15 DIAGNOSIS — Z170 Estrogen receptor positive status [ER+]: Secondary | ICD-10-CM | POA: Diagnosis not present

## 2023-03-15 DIAGNOSIS — C773 Secondary and unspecified malignant neoplasm of axilla and upper limb lymph nodes: Secondary | ICD-10-CM | POA: Diagnosis not present

## 2023-03-15 DIAGNOSIS — Z510 Encounter for antineoplastic radiation therapy: Secondary | ICD-10-CM | POA: Diagnosis not present

## 2023-03-15 LAB — RAD ONC ARIA SESSION SUMMARY
Course Elapsed Days: 26
Plan Fractions Treated to Date: 17
Plan Fractions Treated to Date: 7
Plan Prescribed Dose Per Fraction: 1.8 Gy
Plan Prescribed Dose Per Fraction: 1.8 Gy
Plan Total Fractions Prescribed: 18
Plan Total Fractions Prescribed: 28
Plan Total Prescribed Dose: 32.4 Gy
Plan Total Prescribed Dose: 50.4 Gy
Reference Point Dosage Given to Date: 30.6 Gy
Reference Point Dosage Given to Date: 30.6 Gy
Reference Point Session Dosage Given: 1.8 Gy
Reference Point Session Dosage Given: 1.8 Gy
Session Number: 17

## 2023-03-16 ENCOUNTER — Other Ambulatory Visit: Payer: Self-pay

## 2023-03-16 ENCOUNTER — Ambulatory Visit
Admission: RE | Admit: 2023-03-16 | Discharge: 2023-03-16 | Disposition: A | Discharge status: Home / Self Care | Payer: Medicare HMO | Source: Ambulatory Visit | Attending: Radiation Oncology | Admitting: Radiation Oncology

## 2023-03-16 DIAGNOSIS — C50512 Malignant neoplasm of lower-outer quadrant of left female breast: Secondary | ICD-10-CM | POA: Diagnosis not present

## 2023-03-16 DIAGNOSIS — Z510 Encounter for antineoplastic radiation therapy: Secondary | ICD-10-CM | POA: Diagnosis not present

## 2023-03-16 DIAGNOSIS — C773 Secondary and unspecified malignant neoplasm of axilla and upper limb lymph nodes: Secondary | ICD-10-CM | POA: Diagnosis not present

## 2023-03-16 DIAGNOSIS — Z170 Estrogen receptor positive status [ER+]: Secondary | ICD-10-CM | POA: Diagnosis not present

## 2023-03-16 LAB — RAD ONC ARIA SESSION SUMMARY
Course Elapsed Days: 27
Plan Fractions Treated to Date: 18
Plan Fractions Treated to Date: 8
Plan Prescribed Dose Per Fraction: 1.8 Gy
Plan Prescribed Dose Per Fraction: 1.8 Gy
Plan Total Fractions Prescribed: 18
Plan Total Fractions Prescribed: 28
Plan Total Prescribed Dose: 32.4 Gy
Plan Total Prescribed Dose: 50.4 Gy
Reference Point Dosage Given to Date: 32.4 Gy
Reference Point Dosage Given to Date: 32.4 Gy
Reference Point Session Dosage Given: 1.8 Gy
Reference Point Session Dosage Given: 1.8 Gy
Session Number: 18

## 2023-03-17 ENCOUNTER — Ambulatory Visit
Admission: RE | Admit: 2023-03-17 | Discharge: 2023-03-17 | Disposition: A | Discharge status: Home / Self Care | Payer: Medicare HMO | Source: Ambulatory Visit | Attending: Radiation Oncology | Admitting: Radiation Oncology

## 2023-03-17 ENCOUNTER — Other Ambulatory Visit: Payer: Self-pay

## 2023-03-17 DIAGNOSIS — C50512 Malignant neoplasm of lower-outer quadrant of left female breast: Secondary | ICD-10-CM | POA: Diagnosis not present

## 2023-03-17 DIAGNOSIS — Z170 Estrogen receptor positive status [ER+]: Secondary | ICD-10-CM | POA: Diagnosis not present

## 2023-03-17 DIAGNOSIS — C773 Secondary and unspecified malignant neoplasm of axilla and upper limb lymph nodes: Secondary | ICD-10-CM | POA: Diagnosis not present

## 2023-03-17 DIAGNOSIS — Z510 Encounter for antineoplastic radiation therapy: Secondary | ICD-10-CM | POA: Diagnosis not present

## 2023-03-17 LAB — RAD ONC ARIA SESSION SUMMARY
Course Elapsed Days: 28
Plan Fractions Treated to Date: 19
Plan Fractions Treated to Date: 9
Plan Prescribed Dose Per Fraction: 1.8 Gy
Plan Prescribed Dose Per Fraction: 1.8 Gy
Plan Total Fractions Prescribed: 18
Plan Total Fractions Prescribed: 28
Plan Total Prescribed Dose: 32.4 Gy
Plan Total Prescribed Dose: 50.4 Gy
Reference Point Dosage Given to Date: 34.2 Gy
Reference Point Dosage Given to Date: 34.2 Gy
Reference Point Session Dosage Given: 1.8 Gy
Reference Point Session Dosage Given: 1.8 Gy
Session Number: 19

## 2023-03-18 ENCOUNTER — Ambulatory Visit
Admission: RE | Admit: 2023-03-18 | Discharge: 2023-03-18 | Disposition: A | Discharge status: Home / Self Care | Payer: Medicare HMO | Source: Ambulatory Visit | Attending: Radiation Oncology | Admitting: Radiation Oncology

## 2023-03-18 ENCOUNTER — Other Ambulatory Visit: Payer: Self-pay

## 2023-03-18 DIAGNOSIS — C50512 Malignant neoplasm of lower-outer quadrant of left female breast: Secondary | ICD-10-CM | POA: Diagnosis not present

## 2023-03-18 DIAGNOSIS — Z170 Estrogen receptor positive status [ER+]: Secondary | ICD-10-CM | POA: Diagnosis not present

## 2023-03-18 DIAGNOSIS — C773 Secondary and unspecified malignant neoplasm of axilla and upper limb lymph nodes: Secondary | ICD-10-CM | POA: Diagnosis not present

## 2023-03-18 DIAGNOSIS — Z510 Encounter for antineoplastic radiation therapy: Secondary | ICD-10-CM | POA: Diagnosis not present

## 2023-03-18 LAB — RAD ONC ARIA SESSION SUMMARY
Course Elapsed Days: 29
Plan Fractions Treated to Date: 10
Plan Fractions Treated to Date: 20
Plan Prescribed Dose Per Fraction: 1.8 Gy
Plan Prescribed Dose Per Fraction: 1.8 Gy
Plan Total Fractions Prescribed: 18
Plan Total Fractions Prescribed: 28
Plan Total Prescribed Dose: 32.4 Gy
Plan Total Prescribed Dose: 50.4 Gy
Reference Point Dosage Given to Date: 36 Gy
Reference Point Dosage Given to Date: 36 Gy
Reference Point Session Dosage Given: 1.8 Gy
Reference Point Session Dosage Given: 1.8 Gy
Session Number: 20

## 2023-03-21 ENCOUNTER — Ambulatory Visit: Admission: RE | Admit: 2023-03-21 | Payer: Medicare HMO | Source: Ambulatory Visit

## 2023-03-21 ENCOUNTER — Other Ambulatory Visit: Payer: Self-pay

## 2023-03-21 DIAGNOSIS — Z510 Encounter for antineoplastic radiation therapy: Secondary | ICD-10-CM | POA: Diagnosis not present

## 2023-03-21 DIAGNOSIS — Z170 Estrogen receptor positive status [ER+]: Secondary | ICD-10-CM | POA: Diagnosis not present

## 2023-03-21 DIAGNOSIS — C773 Secondary and unspecified malignant neoplasm of axilla and upper limb lymph nodes: Secondary | ICD-10-CM | POA: Diagnosis not present

## 2023-03-21 DIAGNOSIS — C50512 Malignant neoplasm of lower-outer quadrant of left female breast: Secondary | ICD-10-CM | POA: Diagnosis not present

## 2023-03-21 LAB — RAD ONC ARIA SESSION SUMMARY
Course Elapsed Days: 32
Plan Fractions Treated to Date: 11
Plan Fractions Treated to Date: 21
Plan Prescribed Dose Per Fraction: 1.8 Gy
Plan Prescribed Dose Per Fraction: 1.8 Gy
Plan Total Fractions Prescribed: 18
Plan Total Fractions Prescribed: 28
Plan Total Prescribed Dose: 32.4 Gy
Plan Total Prescribed Dose: 50.4 Gy
Reference Point Dosage Given to Date: 37.8 Gy
Reference Point Dosage Given to Date: 37.8 Gy
Reference Point Session Dosage Given: 1.8 Gy
Reference Point Session Dosage Given: 1.8 Gy
Session Number: 21

## 2023-03-22 ENCOUNTER — Other Ambulatory Visit: Payer: Self-pay

## 2023-03-22 ENCOUNTER — Ambulatory Visit: Payer: Medicare HMO

## 2023-03-22 DIAGNOSIS — C50512 Malignant neoplasm of lower-outer quadrant of left female breast: Secondary | ICD-10-CM | POA: Diagnosis not present

## 2023-03-22 DIAGNOSIS — Z510 Encounter for antineoplastic radiation therapy: Secondary | ICD-10-CM | POA: Diagnosis not present

## 2023-03-22 DIAGNOSIS — Z170 Estrogen receptor positive status [ER+]: Secondary | ICD-10-CM | POA: Diagnosis not present

## 2023-03-22 DIAGNOSIS — C773 Secondary and unspecified malignant neoplasm of axilla and upper limb lymph nodes: Secondary | ICD-10-CM | POA: Diagnosis not present

## 2023-03-22 LAB — RAD ONC ARIA SESSION SUMMARY
Course Elapsed Days: 33
Plan Fractions Treated to Date: 12
Plan Fractions Treated to Date: 22
Plan Prescribed Dose Per Fraction: 1.8 Gy
Plan Prescribed Dose Per Fraction: 1.8 Gy
Plan Total Fractions Prescribed: 18
Plan Total Fractions Prescribed: 28
Plan Total Prescribed Dose: 32.4 Gy
Plan Total Prescribed Dose: 50.4 Gy
Reference Point Dosage Given to Date: 39.6 Gy
Reference Point Dosage Given to Date: 39.6 Gy
Reference Point Session Dosage Given: 1.8 Gy
Reference Point Session Dosage Given: 1.8 Gy
Session Number: 22

## 2023-03-23 ENCOUNTER — Ambulatory Visit
Admission: RE | Admit: 2023-03-23 | Discharge: 2023-03-23 | Disposition: A | Discharge status: Home / Self Care | Payer: Medicare HMO | Source: Ambulatory Visit | Attending: Radiation Oncology | Admitting: Radiation Oncology

## 2023-03-23 ENCOUNTER — Other Ambulatory Visit: Payer: Self-pay

## 2023-03-23 DIAGNOSIS — Z170 Estrogen receptor positive status [ER+]: Secondary | ICD-10-CM | POA: Diagnosis not present

## 2023-03-23 DIAGNOSIS — C773 Secondary and unspecified malignant neoplasm of axilla and upper limb lymph nodes: Secondary | ICD-10-CM | POA: Diagnosis not present

## 2023-03-23 DIAGNOSIS — C50512 Malignant neoplasm of lower-outer quadrant of left female breast: Secondary | ICD-10-CM | POA: Diagnosis not present

## 2023-03-23 DIAGNOSIS — Z510 Encounter for antineoplastic radiation therapy: Secondary | ICD-10-CM | POA: Diagnosis not present

## 2023-03-23 LAB — RAD ONC ARIA SESSION SUMMARY
Course Elapsed Days: 34
Plan Fractions Treated to Date: 13
Plan Fractions Treated to Date: 23
Plan Prescribed Dose Per Fraction: 1.8 Gy
Plan Prescribed Dose Per Fraction: 1.8 Gy
Plan Total Fractions Prescribed: 18
Plan Total Fractions Prescribed: 28
Plan Total Prescribed Dose: 32.4 Gy
Plan Total Prescribed Dose: 50.4 Gy
Reference Point Dosage Given to Date: 41.4 Gy
Reference Point Dosage Given to Date: 41.4 Gy
Reference Point Session Dosage Given: 1.8 Gy
Reference Point Session Dosage Given: 1.8 Gy
Session Number: 23

## 2023-03-24 ENCOUNTER — Other Ambulatory Visit: Payer: Self-pay

## 2023-03-24 ENCOUNTER — Ambulatory Visit
Admission: RE | Admit: 2023-03-24 | Discharge: 2023-03-24 | Disposition: A | Discharge status: Home / Self Care | Payer: Medicare HMO | Source: Ambulatory Visit | Attending: Radiation Oncology | Admitting: Radiation Oncology

## 2023-03-24 DIAGNOSIS — C773 Secondary and unspecified malignant neoplasm of axilla and upper limb lymph nodes: Secondary | ICD-10-CM | POA: Diagnosis not present

## 2023-03-24 DIAGNOSIS — C50512 Malignant neoplasm of lower-outer quadrant of left female breast: Secondary | ICD-10-CM | POA: Diagnosis not present

## 2023-03-24 DIAGNOSIS — Z510 Encounter for antineoplastic radiation therapy: Secondary | ICD-10-CM | POA: Diagnosis not present

## 2023-03-24 DIAGNOSIS — Z170 Estrogen receptor positive status [ER+]: Secondary | ICD-10-CM | POA: Diagnosis not present

## 2023-03-24 LAB — RAD ONC ARIA SESSION SUMMARY
Course Elapsed Days: 35
Plan Fractions Treated to Date: 14
Plan Fractions Treated to Date: 24
Plan Prescribed Dose Per Fraction: 1.8 Gy
Plan Prescribed Dose Per Fraction: 1.8 Gy
Plan Total Fractions Prescribed: 18
Plan Total Fractions Prescribed: 28
Plan Total Prescribed Dose: 32.4 Gy
Plan Total Prescribed Dose: 50.4 Gy
Reference Point Dosage Given to Date: 43.2 Gy
Reference Point Dosage Given to Date: 43.2 Gy
Reference Point Session Dosage Given: 1.8 Gy
Reference Point Session Dosage Given: 1.8 Gy
Session Number: 24

## 2023-03-25 ENCOUNTER — Ambulatory Visit: Payer: Medicare HMO | Admitting: Radiation Oncology

## 2023-03-25 ENCOUNTER — Ambulatory Visit
Admission: RE | Admit: 2023-03-25 | Discharge: 2023-03-25 | Disposition: A | Discharge status: Home / Self Care | Payer: Medicare HMO | Source: Ambulatory Visit | Attending: Radiation Oncology | Admitting: Radiation Oncology

## 2023-03-25 ENCOUNTER — Other Ambulatory Visit: Payer: Self-pay

## 2023-03-25 DIAGNOSIS — Z170 Estrogen receptor positive status [ER+]: Secondary | ICD-10-CM | POA: Diagnosis not present

## 2023-03-25 DIAGNOSIS — C773 Secondary and unspecified malignant neoplasm of axilla and upper limb lymph nodes: Secondary | ICD-10-CM | POA: Diagnosis not present

## 2023-03-25 DIAGNOSIS — Z510 Encounter for antineoplastic radiation therapy: Secondary | ICD-10-CM | POA: Diagnosis not present

## 2023-03-25 DIAGNOSIS — C50512 Malignant neoplasm of lower-outer quadrant of left female breast: Secondary | ICD-10-CM | POA: Diagnosis not present

## 2023-03-25 LAB — RAD ONC ARIA SESSION SUMMARY
Course Elapsed Days: 36
Plan Fractions Treated to Date: 15
Plan Fractions Treated to Date: 25
Plan Prescribed Dose Per Fraction: 1.8 Gy
Plan Prescribed Dose Per Fraction: 1.8 Gy
Plan Total Fractions Prescribed: 18
Plan Total Fractions Prescribed: 28
Plan Total Prescribed Dose: 32.4 Gy
Plan Total Prescribed Dose: 50.4 Gy
Reference Point Dosage Given to Date: 45 Gy
Reference Point Dosage Given to Date: 45 Gy
Reference Point Session Dosage Given: 1.8 Gy
Reference Point Session Dosage Given: 1.8 Gy
Session Number: 25

## 2023-03-28 ENCOUNTER — Other Ambulatory Visit: Payer: Self-pay

## 2023-03-28 ENCOUNTER — Ambulatory Visit
Admission: RE | Admit: 2023-03-28 | Discharge: 2023-03-28 | Disposition: A | Discharge status: Home / Self Care | Payer: Medicare HMO | Source: Ambulatory Visit | Attending: Radiation Oncology | Admitting: Radiation Oncology

## 2023-03-28 DIAGNOSIS — Z510 Encounter for antineoplastic radiation therapy: Secondary | ICD-10-CM | POA: Diagnosis not present

## 2023-03-28 DIAGNOSIS — C50512 Malignant neoplasm of lower-outer quadrant of left female breast: Secondary | ICD-10-CM | POA: Diagnosis not present

## 2023-03-28 DIAGNOSIS — Z170 Estrogen receptor positive status [ER+]: Secondary | ICD-10-CM | POA: Diagnosis not present

## 2023-03-28 DIAGNOSIS — C773 Secondary and unspecified malignant neoplasm of axilla and upper limb lymph nodes: Secondary | ICD-10-CM | POA: Diagnosis not present

## 2023-03-28 LAB — RAD ONC ARIA SESSION SUMMARY
Course Elapsed Days: 39
Plan Fractions Treated to Date: 16
Plan Fractions Treated to Date: 26
Plan Prescribed Dose Per Fraction: 1.8 Gy
Plan Prescribed Dose Per Fraction: 1.8 Gy
Plan Total Fractions Prescribed: 18
Plan Total Fractions Prescribed: 28
Plan Total Prescribed Dose: 32.4 Gy
Plan Total Prescribed Dose: 50.4 Gy
Reference Point Dosage Given to Date: 46.8 Gy
Reference Point Dosage Given to Date: 46.8 Gy
Reference Point Session Dosage Given: 1.8 Gy
Reference Point Session Dosage Given: 1.8 Gy
Session Number: 26

## 2023-03-29 ENCOUNTER — Ambulatory Visit
Admission: RE | Admit: 2023-03-29 | Discharge: 2023-03-29 | Disposition: A | Discharge status: Home / Self Care | Payer: Medicare HMO | Source: Ambulatory Visit | Attending: Radiation Oncology | Admitting: Radiation Oncology

## 2023-03-29 ENCOUNTER — Other Ambulatory Visit: Payer: Self-pay

## 2023-03-29 DIAGNOSIS — Z170 Estrogen receptor positive status [ER+]: Secondary | ICD-10-CM | POA: Diagnosis not present

## 2023-03-29 DIAGNOSIS — C50512 Malignant neoplasm of lower-outer quadrant of left female breast: Secondary | ICD-10-CM | POA: Diagnosis not present

## 2023-03-29 DIAGNOSIS — C773 Secondary and unspecified malignant neoplasm of axilla and upper limb lymph nodes: Secondary | ICD-10-CM | POA: Diagnosis not present

## 2023-03-29 DIAGNOSIS — Z510 Encounter for antineoplastic radiation therapy: Secondary | ICD-10-CM | POA: Diagnosis not present

## 2023-03-29 LAB — RAD ONC ARIA SESSION SUMMARY
Course Elapsed Days: 40
Plan Fractions Treated to Date: 17
Plan Fractions Treated to Date: 27
Plan Prescribed Dose Per Fraction: 1.8 Gy
Plan Prescribed Dose Per Fraction: 1.8 Gy
Plan Total Fractions Prescribed: 18
Plan Total Fractions Prescribed: 28
Plan Total Prescribed Dose: 32.4 Gy
Plan Total Prescribed Dose: 50.4 Gy
Reference Point Dosage Given to Date: 48.6 Gy
Reference Point Dosage Given to Date: 48.6 Gy
Reference Point Session Dosage Given: 1.8 Gy
Reference Point Session Dosage Given: 1.8 Gy
Session Number: 27

## 2023-03-30 ENCOUNTER — Other Ambulatory Visit: Payer: Self-pay

## 2023-03-30 ENCOUNTER — Ambulatory Visit: Payer: Medicare HMO

## 2023-03-30 ENCOUNTER — Ambulatory Visit
Admission: RE | Admit: 2023-03-30 | Discharge: 2023-03-30 | Disposition: A | Discharge status: Home / Self Care | Payer: Medicare HMO | Source: Ambulatory Visit | Attending: Radiation Oncology | Admitting: Radiation Oncology

## 2023-03-30 DIAGNOSIS — C50512 Malignant neoplasm of lower-outer quadrant of left female breast: Secondary | ICD-10-CM | POA: Diagnosis not present

## 2023-03-30 DIAGNOSIS — Z170 Estrogen receptor positive status [ER+]: Secondary | ICD-10-CM | POA: Diagnosis not present

## 2023-03-30 DIAGNOSIS — Z510 Encounter for antineoplastic radiation therapy: Secondary | ICD-10-CM | POA: Diagnosis not present

## 2023-03-30 DIAGNOSIS — C773 Secondary and unspecified malignant neoplasm of axilla and upper limb lymph nodes: Secondary | ICD-10-CM | POA: Diagnosis not present

## 2023-03-30 LAB — RAD ONC ARIA SESSION SUMMARY
Course Elapsed Days: 41
Plan Fractions Treated to Date: 18
Plan Fractions Treated to Date: 28
Plan Prescribed Dose Per Fraction: 1.8 Gy
Plan Prescribed Dose Per Fraction: 1.8 Gy
Plan Total Fractions Prescribed: 18
Plan Total Fractions Prescribed: 28
Plan Total Prescribed Dose: 32.4 Gy
Plan Total Prescribed Dose: 50.4 Gy
Reference Point Dosage Given to Date: 50.4 Gy
Reference Point Dosage Given to Date: 50.4 Gy
Reference Point Session Dosage Given: 1.8 Gy
Reference Point Session Dosage Given: 1.8 Gy
Session Number: 28

## 2023-03-31 ENCOUNTER — Ambulatory Visit
Admission: RE | Admit: 2023-03-31 | Discharge: 2023-03-31 | Disposition: A | Discharge status: Home / Self Care | Payer: Medicare HMO | Source: Ambulatory Visit | Attending: Radiation Oncology | Admitting: Radiation Oncology

## 2023-03-31 ENCOUNTER — Other Ambulatory Visit: Payer: Self-pay

## 2023-03-31 ENCOUNTER — Ambulatory Visit: Payer: Medicare HMO

## 2023-03-31 DIAGNOSIS — C50312 Malignant neoplasm of lower-inner quadrant of left female breast: Secondary | ICD-10-CM | POA: Insufficient documentation

## 2023-03-31 DIAGNOSIS — Z170 Estrogen receptor positive status [ER+]: Secondary | ICD-10-CM | POA: Insufficient documentation

## 2023-03-31 DIAGNOSIS — C773 Secondary and unspecified malignant neoplasm of axilla and upper limb lymph nodes: Secondary | ICD-10-CM | POA: Insufficient documentation

## 2023-03-31 DIAGNOSIS — Z923 Personal history of irradiation: Secondary | ICD-10-CM | POA: Insufficient documentation

## 2023-03-31 DIAGNOSIS — Z510 Encounter for antineoplastic radiation therapy: Secondary | ICD-10-CM | POA: Insufficient documentation

## 2023-03-31 DIAGNOSIS — Z79811 Long term (current) use of aromatase inhibitors: Secondary | ICD-10-CM | POA: Insufficient documentation

## 2023-03-31 DIAGNOSIS — C50512 Malignant neoplasm of lower-outer quadrant of left female breast: Secondary | ICD-10-CM | POA: Diagnosis not present

## 2023-03-31 LAB — RAD ONC ARIA SESSION SUMMARY
Course Elapsed Days: 42
Plan Fractions Treated to Date: 1
Plan Prescribed Dose Per Fraction: 2 Gy
Plan Total Fractions Prescribed: 5
Plan Total Prescribed Dose: 10 Gy
Reference Point Dosage Given to Date: 2 Gy
Reference Point Session Dosage Given: 2 Gy
Session Number: 29

## 2023-04-01 ENCOUNTER — Ambulatory Visit
Admission: RE | Admit: 2023-04-01 | Discharge: 2023-04-01 | Disposition: A | Discharge status: Home / Self Care | Payer: Medicare HMO | Source: Ambulatory Visit | Attending: Radiation Oncology | Admitting: Radiation Oncology

## 2023-04-01 ENCOUNTER — Other Ambulatory Visit: Payer: Self-pay

## 2023-04-01 DIAGNOSIS — Z923 Personal history of irradiation: Secondary | ICD-10-CM | POA: Diagnosis not present

## 2023-04-01 DIAGNOSIS — Z79811 Long term (current) use of aromatase inhibitors: Secondary | ICD-10-CM | POA: Diagnosis not present

## 2023-04-01 DIAGNOSIS — Z170 Estrogen receptor positive status [ER+]: Secondary | ICD-10-CM | POA: Diagnosis not present

## 2023-04-01 DIAGNOSIS — Z510 Encounter for antineoplastic radiation therapy: Secondary | ICD-10-CM | POA: Diagnosis not present

## 2023-04-01 DIAGNOSIS — C50312 Malignant neoplasm of lower-inner quadrant of left female breast: Secondary | ICD-10-CM | POA: Diagnosis not present

## 2023-04-01 LAB — RAD ONC ARIA SESSION SUMMARY
Course Elapsed Days: 43
Plan Fractions Treated to Date: 2
Plan Prescribed Dose Per Fraction: 2 Gy
Plan Total Fractions Prescribed: 5
Plan Total Prescribed Dose: 10 Gy
Reference Point Dosage Given to Date: 4 Gy
Reference Point Session Dosage Given: 2 Gy
Session Number: 30

## 2023-04-04 ENCOUNTER — Other Ambulatory Visit: Payer: Self-pay

## 2023-04-04 ENCOUNTER — Ambulatory Visit: Admission: RE | Admit: 2023-04-04 | Payer: Medicare HMO | Source: Ambulatory Visit

## 2023-04-04 ENCOUNTER — Ambulatory Visit: Payer: Medicare HMO

## 2023-04-04 DIAGNOSIS — C50312 Malignant neoplasm of lower-inner quadrant of left female breast: Secondary | ICD-10-CM | POA: Diagnosis not present

## 2023-04-04 DIAGNOSIS — Z170 Estrogen receptor positive status [ER+]: Secondary | ICD-10-CM | POA: Diagnosis not present

## 2023-04-04 DIAGNOSIS — C50512 Malignant neoplasm of lower-outer quadrant of left female breast: Secondary | ICD-10-CM | POA: Diagnosis not present

## 2023-04-04 DIAGNOSIS — Z79811 Long term (current) use of aromatase inhibitors: Secondary | ICD-10-CM | POA: Diagnosis not present

## 2023-04-04 DIAGNOSIS — Z923 Personal history of irradiation: Secondary | ICD-10-CM | POA: Diagnosis not present

## 2023-04-04 DIAGNOSIS — Z510 Encounter for antineoplastic radiation therapy: Secondary | ICD-10-CM | POA: Diagnosis not present

## 2023-04-04 LAB — RAD ONC ARIA SESSION SUMMARY
Course Elapsed Days: 46
Plan Fractions Treated to Date: 3
Plan Prescribed Dose Per Fraction: 2 Gy
Plan Total Fractions Prescribed: 5
Plan Total Prescribed Dose: 10 Gy
Reference Point Dosage Given to Date: 6 Gy
Reference Point Session Dosage Given: 2 Gy
Session Number: 31

## 2023-04-04 NOTE — Progress Notes (Signed)
Patient Care Team: [name], MD as PCP - General (Internal Medicine) Lanier Prude, MD as PCP - Electrophysiology (Cardiology) Hillis Range, MD (Inactive) as Attending Physician (Cardiology) Almond Lint, MD as Consulting Physician (General Surgery) Serena Croissant, MD as Consulting Physician (Hematology and Oncology) Dorothy Puffer, MD as Consulting Physician (Radiation Oncology) Pershing Proud, RN as Oncology Nurse Navigator Donnelly Angelica, RN as Oncology Nurse Navigator  DIAGNOSIS: No diagnosis found.  SUMMARY OF ONCOLOGIC HISTORY: Oncology History  Malignant neoplasm of lower-inner quadrant of left breast in female, estrogen receptor positive (HCC)  11/08/2022 Surgery   Screening mammogram detected left breast masses at 8 o'clock position: 1.1 cm: Grade 1 IDC ER 95%, PR 80%, HER2 negative 0.7 cm mass: Grade 1 IDC ER 100%, PR 90%, HER2 negative, Ki-67 10%, multiple lymph nodes 1 was biopsied and benign discordant   11/15/2022 Initial Diagnosis   Primary malignant neoplasm of lower outer quadrant of female breast, left (HCC)   11/15/2022 Cancer Staging   Staging form: Breast, AJCC 8th Edition - Clinical stage from 11/15/2022: Stage IA (cT1c, cN0(f), cM0, G1, ER+, PR+, HER2-) - Signed by Ronny Bacon, PA-C on 11/15/2022 Stage prefix: Initial diagnosis Method of lymph node assessment: Core biopsy Histologic grading system: 3 grade system    Genetic Testing   [facility] Custom Cancer Panel+RNA was Negative. Report date is 11/29/2022.  The Custom Hereditary Cancers Panel offered by [facility] includes sequencing and/or deletion duplication testing of the following 43 genes: APC, ATM, AXIN2, BAP1, BARD1, BMPR1A, BRCA1, BRCA2, BRIP1, CDH1, CDK4, CDKN2A (p14ARF and p16INK4a only), CHEK2, CTNNA1, EPCAM (Deletion/duplication testing only), FH, GREM1 (promoter region duplication testing only), HOXB13, KIT, MBD4, MEN1, MLH1, MSH2, MSH3, MSH6, MUTYH, NF1, NHTL1, PALB2, PDGFRA,  PMS2, POLD1, POLE, PTEN, RAD51C, RAD51D, SMAD4, SMARCA4. STK11, TP53, TSC1, TSC2, and VHL.   12/21/2022 Surgery   Left lumpectomy: 2 foci IDC 1 with lobular features 1 cm and 0.9 cm grade 1, grade 2 DCIS, LCIS, margins negative, LVI present, ER 95%, PR 80%, HER2 2+ by IHC, FISH negative, Ki-67 10% 1/4 lymph nodes positive, another lymph node with micrometastasis   01/04/2023 Cancer Staging   Staging form: Breast, AJCC 8th Edition - Pathologic: Stage IA (pT1b, pN1(sn), cM0, G2, ER+, PR+, HER2-) - Signed by Serena Croissant, MD on 01/04/2023 Method of lymph node assessment: Sentinel lymph node biopsy Histologic grading system: 3 grade system     CHIEF COMPLIANT: Post xrt  INTERVAL HISTORY: Pamela Davenport is a 67 y.o. female is here because of recent diagnosis of left breast cancer. She presents to the clinic for a follow-up.    ALLERGIES:  is allergic to methylprednisolone, sulfa antibiotics, sulfonamide derivatives, and macrobid [nitrofurantoin].  MEDICATIONS:  Current Outpatient Medications  Medication Sig Dispense Refill   ALPRAZolam (XANAX) 0.5 MG tablet Take 1 tablet (0.5 mg total) by mouth 2 (two) times daily. 60 tablet 2   amLODipine (NORVASC) 10 MG tablet Take 1 tablet (10 mg total) by mouth at bedtime. 30 tablet 6   esomeprazole (NEXIUM) 40 MG capsule Take 1 capsule (40 mg total) by mouth daily. 90 capsule 0   flecainide (TAMBOCOR) 150 MG tablet Take 2 tablets by mouth at the onset of AFIB 30 tablet 3   hydroxychloroquine (PLAQUENIL) 200 MG tablet Take 1 tablet (200 mg total) by mouth 2 (two) times daily. 180 tablet 0   hydrOXYzine (ATARAX) 25 MG tablet Take 1 tablet (25 mg total) by mouth 3 (three) times daily as needed.  30 tablet 2   irbesartan (AVAPRO) 300 MG tablet Take 1 tablet (300 mg total) by mouth daily. 90 tablet 3   nadolol (CORGARD) 20 MG tablet Take 1 tablet (20 mg total) by mouth daily. 90 tablet 3   nystatin-triamcinolone ointment (MYCOLOG) Apply to affected areas two  times a day 20 g 2   rivaroxaban (XARELTO) 20 MG TABS tablet Take 1 tablet (20 mg total) by mouth daily with supper. 90 tablet 3   solifenacin (VESICARE) 5 MG tablet Take 5 mg by mouth daily.     TART CHERRY PO Take 2 tablets by mouth daily.     thiamine (VITAMIN B-1) 50 MG tablet Take 1 tablet (50 mg total) by mouth daily. 90 tablet 1   No current facility-administered medications for this visit.    PHYSICAL EXAMINATION: ECOG PERFORMANCE STATUS: {CHL ONC ECOG PS:[phone]}  There were no vitals filed for this visit. There were no vitals filed for this visit.  BREAST:*** No palpable masses or nodules in either right or left breasts. No palpable axillary supraclavicular or infraclavicular adenopathy no breast tenderness or nipple discharge. (exam performed in the presence of a chaperone)  LABORATORY DATA:  I have reviewed the data as listed    Latest Ref Rng & Units 02/09/2023   10:48 AM 01/27/2023    3:15 PM 12/16/2022    3:46 PM  CMP  Glucose 70 - 99 mg/dL 301  99  82   BUN 8 - 27 mg/dL 17  20  29    Creatinine 0.57 - 1.00 mg/dL 6.01  0.93  2.35   Sodium 134 - 144 mmol/L 138  141  136   Potassium 3.5 - 5.2 mmol/L 4.5  4.4  4.4   Chloride 96 - 106 mmol/L 106  108  105   CO2 20 - 29 mmol/L 20  18  22    Calcium 8.7 - 10.3 mg/dL 9.3  9.2  9.3     Lab Results  Component Value Date   WBC 6.7 11/17/2022   HGB 11.1 (L) 11/17/2022   HCT 33.2 (L) 11/17/2022   MCV 82.4 11/17/2022   PLT 275 11/17/2022   NEUTROABS 4.5 11/17/2022    ASSESSMENT & PLAN:  No problem-specific Assessment & Plan notes found for this encounter.    No orders of the defined types were placed in this encounter.  The patient has a good understanding of the overall plan. she agrees with it. she will call with any problems that may develop before the next visit here. Total time spent: 30 mins including face to face time and time spent for planning, charting and co-ordination of care   Sherlyn Lick,  CMA 04/04/23    I Janan Ridge am acting as a Neurosurgeon for [employer]  ***

## 2023-04-05 ENCOUNTER — Other Ambulatory Visit: Payer: Self-pay

## 2023-04-05 ENCOUNTER — Ambulatory Visit: Admission: RE | Admit: 2023-04-05 | Payer: Medicare HMO | Source: Ambulatory Visit

## 2023-04-05 ENCOUNTER — Ambulatory Visit: Payer: Medicare HMO

## 2023-04-05 ENCOUNTER — Inpatient Hospital Stay: Payer: Medicare HMO | Attending: Hematology and Oncology | Admitting: Hematology and Oncology

## 2023-04-05 VITALS — BP 136/66 | HR 73 | Temp 97.3°F | Resp 18 | Ht 69.0 in | Wt 274.1 lb

## 2023-04-05 DIAGNOSIS — Z170 Estrogen receptor positive status [ER+]: Secondary | ICD-10-CM | POA: Diagnosis not present

## 2023-04-05 DIAGNOSIS — Z79811 Long term (current) use of aromatase inhibitors: Secondary | ICD-10-CM | POA: Diagnosis not present

## 2023-04-05 DIAGNOSIS — Z923 Personal history of irradiation: Secondary | ICD-10-CM | POA: Diagnosis not present

## 2023-04-05 DIAGNOSIS — C50312 Malignant neoplasm of lower-inner quadrant of left female breast: Secondary | ICD-10-CM | POA: Diagnosis not present

## 2023-04-05 DIAGNOSIS — Z510 Encounter for antineoplastic radiation therapy: Secondary | ICD-10-CM | POA: Diagnosis not present

## 2023-04-05 LAB — RAD ONC ARIA SESSION SUMMARY
Course Elapsed Days: 47
Plan Fractions Treated to Date: 4
Plan Prescribed Dose Per Fraction: 2 Gy
Plan Total Fractions Prescribed: 5
Plan Total Prescribed Dose: 10 Gy
Reference Point Dosage Given to Date: 8 Gy
Reference Point Session Dosage Given: 2 Gy
Session Number: 32

## 2023-04-05 MED ORDER — ANASTROZOLE 1 MG PO TABS: 1.0000 mg | ORAL_TABLET | Freq: Every day | ORAL | 3 refills | Status: DC

## 2023-04-05 NOTE — Assessment & Plan Note (Addendum)
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   Pathology counseling: I discussed the final pathology report of the patient provided  a copy of this report. I discussed the margins as well as lymph node surgeries. We also discussed the final staging along with previously performed ER/PR and HER-2/neu testing.   Treatment plan: Oncotype DX score 18 (distant recurrence at 9 years: 16%) Adjuvant radiation therapy 02/18/2023-04/06/2023 Adjuvant antiestrogen therapy with anastrozole 1 mg daily to start 04/05/2023   She works as a Patent examiner. Anastrozole counseling: We discussed the risks and benefits of anti-estrogen therapy with aromatase inhibitors. These include but not limited to insomnia, hot flashes, mood changes, vaginal dryness, bone density loss, and weight gain. We strongly believe that the benefits far outweigh the risks. Patient understands these risks and consented to starting treatment. Planned treatment duration is 7 years.  Return to clinic in 3 months for survivorship care plan visit

## 2023-04-06 ENCOUNTER — Other Ambulatory Visit: Payer: Self-pay

## 2023-04-06 ENCOUNTER — Encounter: Payer: Self-pay | Admitting: Radiation Oncology

## 2023-04-06 ENCOUNTER — Ambulatory Visit
Admission: RE | Admit: 2023-04-06 | Discharge: 2023-04-06 | Disposition: A | Discharge status: Home / Self Care | Payer: Medicare HMO | Source: Ambulatory Visit | Attending: Radiation Oncology | Admitting: Radiation Oncology

## 2023-04-06 DIAGNOSIS — Z510 Encounter for antineoplastic radiation therapy: Secondary | ICD-10-CM | POA: Diagnosis not present

## 2023-04-06 DIAGNOSIS — Z79811 Long term (current) use of aromatase inhibitors: Secondary | ICD-10-CM | POA: Diagnosis not present

## 2023-04-06 DIAGNOSIS — Z170 Estrogen receptor positive status [ER+]: Secondary | ICD-10-CM | POA: Diagnosis not present

## 2023-04-06 DIAGNOSIS — Z923 Personal history of irradiation: Secondary | ICD-10-CM | POA: Diagnosis not present

## 2023-04-06 DIAGNOSIS — C50312 Malignant neoplasm of lower-inner quadrant of left female breast: Secondary | ICD-10-CM | POA: Diagnosis not present

## 2023-04-06 LAB — RAD ONC ARIA SESSION SUMMARY
Course Elapsed Days: 48
Plan Fractions Treated to Date: 5
Plan Prescribed Dose Per Fraction: 2 Gy
Plan Total Fractions Prescribed: 5
Plan Total Prescribed Dose: 10 Gy
Reference Point Dosage Given to Date: 10 Gy
Reference Point Session Dosage Given: 2 Gy
Session Number: 33

## 2023-04-08 ENCOUNTER — Encounter: Payer: Self-pay | Admitting: *Deleted

## 2023-04-11 NOTE — Progress Notes (Deleted)
Office Visit Note  Patient: Pamela Davenport             Date of Birth: February 29, 1956           MRN: [id number]             PCP: [name], MD Referring: [name], MD Visit Date: 04/25/2023 Occupation: @GUAROCC @  Subjective:  No chief complaint on file.   History of Present Illness: Pamela Davenport is a 67 y.o. female ***     Activities of Daily Living:  Patient reports morning stiffness for *** {minute/hour:19697}.   Patient {ACTIONS;DENIES/REPORTS:21021675::"Denies"} nocturnal pain.  Difficulty dressing/grooming: {ACTIONS;DENIES/REPORTS:21021675::"Denies"} Difficulty climbing stairs: {ACTIONS;DENIES/REPORTS:21021675::"Denies"} Difficulty getting out of chair: {ACTIONS;DENIES/REPORTS:21021675::"Denies"} Difficulty using hands for taps, buttons, cutlery, and/or writing: {ACTIONS;DENIES/REPORTS:21021675::"Denies"}  No Rheumatology ROS completed.   PMFS History:  Patient Active Problem List   Diagnosis Date Noted   Genetic testing 11/30/2022   Family history of prostate cancer 11/19/2022   Malignant neoplasm of lower-inner quadrant of left breast in female, estrogen receptor positive (HCC) 11/15/2022   Anemia due to acquired thiamine deficiency 05/07/2022   Deficiency anemia 05/04/2022   Gastroesophageal reflux disease with esophagitis without hemorrhage 05/04/2022   Esophageal dysphagia 05/04/2022   Intermittent palpitations 05/04/2022   OAB (overactive bladder) 12/31/2021   Encounter for general adult medical examination with abnormal findings 08/26/2021   Obesity, morbid, BMI 40.0-49.9 (HCC) 08/26/2021   PAF (paroxysmal atrial fibrillation) (HCC) 08/26/2021   GERD without esophagitis 10/04/2019   Vitamin D deficiency disease 09/25/2019   Hypercoagulable state (HCC) 09/24/2019   Iron deficiency anemia 12/07/2018   Other dietary vitamin B12 deficiency anemia 12/07/2018   Visit for screening mammogram 04/18/2017   Hyperlipidemia with target LDL less than 130  10/19/2016   Essential hypertension 01/13/2015   Chronic pulmonary embolism (HCC) 12/05/2008    Past Medical History:  Diagnosis Date   Atypical chest pain    normal myoview 11/11/10   Blood transfusion without reported diagnosis    DVT (deep venous thrombosis) (HCC)    recurrent; chronically anticoagulated with coumadin   Dysrhythmia    Family history of anesthesia complication    Sister had decrease in respirations after surgery   Heart murmur    Herniated lumbar intervertebral disc    Hypertension    Paroxysmal atrial fibrillation (HCC)    SINCE AGE 78    PE (pulmonary embolism)    chronically anticoagulated with coumadin   Pneumonia 07/20/2012   PONV (postoperative nausea and vomiting)    Rheumatoid arthritis (HCC)    Sickle cell trait (HCC)    Sinus congestion 08/14/2018   C/O NASAL/CHEST  CONGESTION, COUGHING WITH PHLEGM, PER PATIENT , SHE IS TO BEGIN TAKING AUGMENTIN DOSE PACK TODAY;     Strep throat at age 30   The patient reports a severe strep throat infection at age 79 and   is not clear as to whether or not she may have had rheumatic fever.   Symptomatic premature ventricular contractions    improved s/p ablation   Urgency of urination    Uterine bleeding 2008   Uterine artery embolization in 2008 for uterine bleeding.     Family History  Problem Relation Age of Onset   Hypertension Mother    Diabetes Mother    Uterine cancer Sister 41   Prostate cancer Brother 34   Prostate cancer Brother 74   Prostate cancer Brother    Prostate cancer Maternal Uncle    Prostate cancer Maternal  Uncle    Non-Hodgkin's lymphoma Paternal Aunt 54   Pancreatic cancer Paternal Aunt 8   Colon cancer Paternal Grandmother 45   Healthy Son    Breast cancer Neg Hx    Past Surgical History:  Procedure Laterality Date   BREAST BIOPSY Left 11/08/2022   [place] LT BREAST BX W LOC DEV EA ADD LESION IMG BX SPEC US GUIDE 11/08/2022 [facility]   BREAST BIOPSY Left 11/08/2022   [place] LT  BREAST BX W LOC DEV 1ST LESION IMG BX SPEC US GUIDE 11/08/2022 [facility]   BREAST BIOPSY Left 12/17/2022   [place] LT RADIOACTIVE SEED LOC 12/17/2022 [facility]   BREAST BIOPSY Left 12/17/2022   [place] LT RADIOACTIVE SEED EA ADD LESION 12/17/2022 [facility]   BREAST BIOPSY Left 12/17/2022   [place] LT RADIOACTIVE SEED EA ADD LESION 12/17/2022 [facility]   BREAST LUMPECTOMY WITH RADIOACTIVE SEED AND SENTINEL LYMPH NODE BIOPSY Left 12/21/2022   Procedure: LEFT BREAST BRACKETED SEED LUMPECTOMY AND LEFT SENTINEL LYMPH NODE MAPPING;  Surgeon: Harriette Bouillon, MD;  Location: [facility];  Service: General;  Laterality: Left;  GEN & PEC BLOCK   COLONOSCOPY W/ POLYPECTOMY     Diagnostic D&C hysteroscopy and Novasure ablation  03/17/2004   DILATION AND CURETTAGE OF UTERUS  12/17/2011   Procedure: DILATATION AND CURETTAGE;  Surgeon: Kathreen Cosier, MD;  Location: [facility];  Service: Gynecology;  Laterality: N/A;  With Attempted Hydrothermal Ablation   LUMBAR LAMINECTOMY/DECOMPRESSION MICRODISCECTOMY Left 02/21/2013   Procedure: LUMBAR LAMINECTOMY/DECOMPRESSION MICRODISCECTOMY 1 LEVEL;  Surgeon: Carmela Hurt, MD;  Location: [facility];  Service: Neurosurgery;  Laterality: Left;  LEFT Lumbar Three-Four Laminotomy foraminotomy microdiskectomy   MANDIBLE SURGERY     under bite   PVC Ablation  2010   ROBOTIC ASSISTED TOTAL HYSTERECTOMY WITH BILATERAL SALPINGO OOPHERECTOMY Bilateral 08/21/2018   Procedure: XI ROBOTIC ASSISTED TOTAL HYSTERECTOMY WITH BILATERAL SALPINGO OOPHORECTOMY;  Surgeon: Adolphus Birchwood, MD;  Location: [facility];  Service: Gynecology;  Laterality: Bilateral;   SPINE SURGERY     TONSILLECTOMY     UTERINE ARTERY EMBOLIZATION  2008   for uterine bleeding   VASCULAR SURGERY     laser of left leg   Social History   Social History Narrative   Works as a Engineer, civil (consulting) at [employer] History  Administered Date(s) Administered    Influenza,inj,Quad PF,6+ Mos 06/14/2019   Influenza-Unspecified 05/05/2021   PFIZER(Purple Top)SARS-COV-2 Vaccination 11/08/2019, 11/30/2019, 08/19/2020   PNEUMOCOCCAL CONJUGATE-20 08/26/2021   Tdap 02/03/2015   Zoster Recombinant(Shingrix) 08/26/2021     Objective: Vital Signs: There were no vitals taken for this visit.   Physical Exam   Musculoskeletal Exam: ***  CDAI Exam: CDAI Score: -- Patient Global: --; Provider Global: -- Swollen: --; Tender: -- Joint Exam 04/25/2023   No joint exam has been documented for this visit   There is currently no information documented on the homunculus. Go to the Rheumatology activity and complete the homunculus joint exam.  Investigation: No additional findings.  Imaging: No results found.  Recent Labs: Lab Results  Component Value Date   WBC 6.7 11/17/2022   HGB 11.1 (L) 11/17/2022   PLT 275 11/17/2022   NA 138 02/09/2023   K 4.5 02/09/2023   CL 106 02/09/2023   CO2 20 02/09/2023   GLUCOSE 102 (H) 02/09/2023   BUN 17 02/09/2023   CREATININE 1.08 (H) 02/09/2023   BILITOT 0.4 11/17/2022   ALKPHOS 81 11/17/2022  AST 16 11/17/2022   ALT 11 11/17/2022   PROT 7.5 11/17/2022   ALBUMIN 4.0 11/17/2022   CALCIUM 9.3 02/09/2023   GFRAA 72 11/13/2020   QFTBGOLDPLUS NEGATIVE 01/15/2019    Speciality Comments: PLQ Eye Exam 09/21/2021 WNL @ [facility]  Follow up in 6 months   Procedures:  No procedures performed Allergies: Methylprednisolone, Sulfa antibiotics, Sulfonamide derivatives, and Macrobid [nitrofurantoin]   Assessment / Plan:     Visit Diagnoses: No diagnosis found.  Orders: No orders of the defined types were placed in this encounter.  No orders of the defined types were placed in this encounter.   Face-to-face time spent with patient was *** minutes. Greater than 50% of time was spent in counseling and coordination of care.  Follow-Up Instructions: No follow-ups on file.   Ellen Henri,  CMA  Note - This record has been created using Animal nutritionist.  Chart creation errors have been sought, but may not always  have been located. Such creation errors do not reflect on  the standard of medical care.

## 2023-04-14 NOTE — Progress Notes (Signed)
  Radiation Oncology         [phone]) [phone] ________________________________  Name: Pamela Davenport MRN: [id number]  Date: 04/06/2023  DOB: 1956-01-30  End of Treatment Note  Diagnosis:   Stage IA, pT1cN1M0, grade 1, ER/PR positive invasive ductal carcinoma of the left breast.   Indication for treatment:  Curative       Radiation treatment dates:   02/17/23-04/06/23  Site/dose:   The patient initially received a dose of 50.4 Gy in 28 fractions to the left breast and SCLV region using a 4-field approach. This was delivered using a 3-D conformal technique. The patient then received a boost to the seroma. This delivered an additional 10 Gy in 5 fractions using a Photon Boost. The total dose was 60.4 Gy.  Narrative: The patient tolerated radiation treatment relatively well.   The patient had some expected skin irritation as she progressed during treatment. Moist desquamation was not present at the end of treatment.  Plan: The patient will receive a call in about one month from the radiation oncology department. She will continue follow up with Dr. Pamelia Hoit as well.      Osker Mason, PAC

## 2023-04-16 NOTE — Radiation Completion Notes (Signed)
Patient Name: Pamela Davenport, CROUCHER MRN: [id number] Date of Birth: Aug 18, 1956 Referring Physician: Serena Croissant, M.D. Date of Service: 2023-04-16 Radiation Oncologist: Dorothy Puffer, M.D. [facility]                             RADIATION ONCOLOGY END OF TREATMENT NOTE     Diagnosis: C50.512 Malignant neoplasm of lower-outer quadrant of left female breast Staging on 2023-01-04: Malignant neoplasm of lower-inner quadrant of left breast in female, estrogen receptor positive (HCC) T=pT1b, N=pN1, M=cM0 Staging on 2022-11-15: Malignant neoplasm of lower-inner quadrant of left breast in female, estrogen receptor positive (HCC) T=cT1c, N=cN0, M=cM0 Intent: Curative     ==========DELIVERED PLANS==========  First Treatment Date: 2023-02-17 - Last Treatment Date: 2023-04-06   Plan Name: Breast_L_BH Site: Breast, Left Technique: 3D Mode: Photon Dose Per Fraction: 1.8 Gy Prescribed Dose (Delivered / Prescribed): 18 Gy / 18 Gy Prescribed Fxs (Delivered / Prescribed): 10 / 10   Plan Name: Breast_L_BH:1 Site: Breast, Left Technique: 3D Mode: Photon Dose Per Fraction: 1.8 Gy Prescribed Dose (Delivered / Prescribed): 32.4 Gy / 32.4 Gy Prescribed Fxs (Delivered / Prescribed): 18 / 18   Plan Name: Brst_L_Scv_BH Site: Sclav-LT Technique: 3D Mode: Photon Dose Per Fraction: 1.8 Gy Prescribed Dose (Delivered / Prescribed): 50.4 Gy / 50.4 Gy Prescribed Fxs (Delivered / Prescribed): 28 / 28   Plan Name: Brst_L_Bst_BH Site: Breast, Left Technique: 3D Mode: Photon Dose Per Fraction: 2 Gy Prescribed Dose (Delivered / Prescribed): 10 Gy / 10 Gy Prescribed Fxs (Delivered / Prescribed): 5 / 5     ==========ON TREATMENT VISIT DATES========== 2023-02-18, 2023-02-25, 2023-03-11, 2023-03-18, 2023-03-25, 2023-04-01, 2023-04-06     ==========UPCOMING VISITS==========       ==========APPENDIX - ON TREATMENT VISIT NOTES==========   See weekly On Treatment Notes in Epic for  details.

## 2023-04-25 ENCOUNTER — Ambulatory Visit: Payer: Medicare Other | Admitting: Physician Assistant

## 2023-04-25 DIAGNOSIS — Z862 Personal history of diseases of the blood and blood-forming organs and certain disorders involving the immune mechanism: Secondary | ICD-10-CM

## 2023-04-25 DIAGNOSIS — M7061 Trochanteric bursitis, right hip: Secondary | ICD-10-CM

## 2023-04-25 DIAGNOSIS — R7989 Other specified abnormal findings of blood chemistry: Secondary | ICD-10-CM

## 2023-04-25 DIAGNOSIS — M170 Bilateral primary osteoarthritis of knee: Secondary | ICD-10-CM

## 2023-04-25 DIAGNOSIS — Z86718 Personal history of other venous thrombosis and embolism: Secondary | ICD-10-CM

## 2023-04-25 DIAGNOSIS — Z8679 Personal history of other diseases of the circulatory system: Secondary | ICD-10-CM

## 2023-04-25 DIAGNOSIS — E785 Hyperlipidemia, unspecified: Secondary | ICD-10-CM

## 2023-04-25 DIAGNOSIS — M19042 Primary osteoarthritis, left hand: Secondary | ICD-10-CM

## 2023-04-25 DIAGNOSIS — E559 Vitamin D deficiency, unspecified: Secondary | ICD-10-CM

## 2023-04-25 DIAGNOSIS — G8929 Other chronic pain: Secondary | ICD-10-CM

## 2023-04-25 DIAGNOSIS — M0579 Rheumatoid arthritis with rheumatoid factor of multiple sites without organ or systems involvement: Secondary | ICD-10-CM

## 2023-04-25 DIAGNOSIS — M65342 Trigger finger, left ring finger: Secondary | ICD-10-CM

## 2023-04-25 DIAGNOSIS — I493 Ventricular premature depolarization: Secondary | ICD-10-CM

## 2023-04-25 DIAGNOSIS — Z86711 Personal history of pulmonary embolism: Secondary | ICD-10-CM

## 2023-04-25 DIAGNOSIS — Z79899 Other long term (current) drug therapy: Secondary | ICD-10-CM

## 2023-04-25 DIAGNOSIS — I10 Essential (primary) hypertension: Secondary | ICD-10-CM

## 2023-04-25 DIAGNOSIS — R768 Other specified abnormal immunological findings in serum: Secondary | ICD-10-CM

## 2023-04-25 DIAGNOSIS — M19071 Primary osteoarthritis, right ankle and foot: Secondary | ICD-10-CM

## 2023-04-27 ENCOUNTER — Other Ambulatory Visit: Payer: Self-pay

## 2023-04-28 NOTE — Progress Notes (Unsigned)
Office Visit Note  Patient: Pamela Davenport             Date of Birth: 1956-07-06           MRN: [id number]             PCP: [name], MD Referring: [name], MD Visit Date: 05/12/2023 Occupation: @GUAROCC @  Subjective:  Pain in both wrist joints    History of Present Illness: Pamela Davenport is a 67 y.o. female with history of seropositive rheumatoid arthritis.  Patient remains on Plaquenil 200 mg 1 tablet by mouth twice daily-Started in May 2020.  She is tolerating plaquenil without any side effects and has not missed any doses recently.  Patient presents today with increased pain involving both wrist and both thumbs.  She states her pain started about 1 month ago around the time of completing radiation therapy. She has also been started on anastrozole by Dr. Pamelia Hoit. She currently rates the pain a 4 out of 10.  She has tried using CBD oil with no improvement.  She is also been taking tart cherry and turmeric for the anti-inflammatory properties.  She denies any swelling in her wrist joints.  She denies any numbness or tingling in her hands at this time.  She denies any other joint pain or joint swelling currently.  She has been trying to walk 30 minutes daily for exercise.    Activities of Daily Living:  Patient reports morning stiffness for 30 minutes.   Patient Reports nocturnal pain.  Difficulty dressing/grooming: Reports Difficulty climbing stairs: Reports Difficulty getting out of chair: Reports Difficulty using hands for taps, buttons, cutlery, and/or writing: Reports  Review of Systems  Constitutional:  Positive for fatigue.  HENT:  Positive for mouth dryness. Negative for mouth sores.   Eyes:  Positive for dryness.  Respiratory:  Negative for shortness of breath.   Cardiovascular:  Positive for chest pain and palpitations.  Gastrointestinal:  Positive for constipation. Negative for blood in stool and diarrhea.  Endocrine: Negative for increased urination.   Genitourinary:  Negative for involuntary urination.  Musculoskeletal:  Positive for joint pain, joint pain, muscle weakness and morning stiffness. Negative for gait problem, joint swelling, myalgias, muscle tenderness and myalgias.  Skin:  Negative for color change, rash, hair loss and sensitivity to sunlight.  Allergic/Immunologic: Negative for susceptible to infections.  Neurological:  Positive for dizziness and headaches.  Hematological:  Negative for swollen glands.  Psychiatric/Behavioral:  Positive for sleep disturbance. Negative for depressed mood. The patient is nervous/anxious.     PMFS History:  Patient Active Problem List   Diagnosis Date Noted   Genetic testing 11/30/2022   Family history of prostate cancer 11/19/2022   Malignant neoplasm of lower-inner quadrant of left breast in female, estrogen receptor positive (HCC) 11/15/2022   Anemia due to acquired thiamine deficiency 05/07/2022   Deficiency anemia 05/04/2022   Gastroesophageal reflux disease with esophagitis without hemorrhage 05/04/2022   Esophageal dysphagia 05/04/2022   Intermittent palpitations 05/04/2022   OAB (overactive bladder) 12/31/2021   Encounter for general adult medical examination with abnormal findings 08/26/2021   Obesity, morbid, BMI 40.0-49.9 (HCC) 08/26/2021   PAF (paroxysmal atrial fibrillation) (HCC) 08/26/2021   GERD without esophagitis 10/04/2019   Vitamin D deficiency disease 09/25/2019   Hypercoagulable state (HCC) 09/24/2019   Iron deficiency anemia 12/07/2018   Other dietary vitamin B12 deficiency anemia 12/07/2018   Visit for screening mammogram 04/18/2017   Hyperlipidemia with target LDL less  than 130 10/19/2016   Essential hypertension 01/13/2015   Chronic pulmonary embolism (HCC) 12/05/2008    Past Medical History:  Diagnosis Date   Atypical chest pain    normal myoview 11/11/10   Blood transfusion without reported diagnosis    DVT (deep venous thrombosis) (HCC)     recurrent; chronically anticoagulated with coumadin   Dysrhythmia    Family history of anesthesia complication    Sister had decrease in respirations after surgery   Heart murmur    Herniated lumbar intervertebral disc    Hypertension    Paroxysmal atrial fibrillation (HCC)    SINCE AGE 85    PE (pulmonary embolism)    chronically anticoagulated with coumadin   Pneumonia 07/20/2012   PONV (postoperative nausea and vomiting)    Rheumatoid arthritis (HCC)    Sickle cell trait (HCC)    Sinus congestion 08/14/2018   C/O NASAL/CHEST  CONGESTION, COUGHING WITH PHLEGM, PER PATIENT , SHE IS TO BEGIN TAKING AUGMENTIN DOSE PACK TODAY;     Strep throat at age 66   The patient reports a severe strep throat infection at age 29 and   is not clear as to whether or not she may have had rheumatic fever.   Symptomatic premature ventricular contractions    improved s/p ablation   Urgency of urination    Uterine bleeding 2008   Uterine artery embolization in 2008 for uterine bleeding.     Family History  Problem Relation Age of Onset   Hypertension Mother    Diabetes Mother    Uterine cancer Sister 31   Prostate cancer Brother 59   Prostate cancer Brother 86   Prostate cancer Brother    Prostate cancer Maternal Uncle    Prostate cancer Maternal Uncle    Non-Hodgkin's lymphoma Paternal Aunt 42   Pancreatic cancer Paternal Aunt 67   Colon cancer Paternal Grandmother 34   Healthy Son    Breast cancer Neg Hx    Past Surgical History:  Procedure Laterality Date   BREAST BIOPSY Left 11/08/2022   [place] LT BREAST BX W LOC DEV EA ADD LESION IMG BX SPEC US GUIDE 11/08/2022 [facility]   BREAST BIOPSY Left 11/08/2022   [place] LT BREAST BX W LOC DEV 1ST LESION IMG BX SPEC US GUIDE 11/08/2022 [facility]   BREAST BIOPSY Left 12/17/2022   [place] LT RADIOACTIVE SEED LOC 12/17/2022 [facility]   BREAST BIOPSY Left 12/17/2022   [place] LT RADIOACTIVE SEED EA ADD LESION 12/17/2022 [facility]    BREAST BIOPSY Left 12/17/2022   [place] LT RADIOACTIVE SEED EA ADD LESION 12/17/2022 [facility]   BREAST LUMPECTOMY WITH RADIOACTIVE SEED AND SENTINEL LYMPH NODE BIOPSY Left 12/21/2022   Procedure: LEFT BREAST BRACKETED SEED LUMPECTOMY AND LEFT SENTINEL LYMPH NODE MAPPING;  Surgeon: Harriette Bouillon, MD;  Location: [facility];  Service: General;  Laterality: Left;  GEN & PEC BLOCK   COLONOSCOPY W/ POLYPECTOMY     Diagnostic D&C hysteroscopy and Novasure ablation  03/17/2004   DILATION AND CURETTAGE OF UTERUS  12/17/2011   Procedure: DILATATION AND CURETTAGE;  Surgeon: Kathreen Cosier, MD;  Location: [facility];  Service: Gynecology;  Laterality: N/A;  With Attempted Hydrothermal Ablation   LUMBAR LAMINECTOMY/DECOMPRESSION MICRODISCECTOMY Left 02/21/2013   Procedure: LUMBAR LAMINECTOMY/DECOMPRESSION MICRODISCECTOMY 1 LEVEL;  Surgeon: Carmela Hurt, MD;  Location: [facility];  Service: Neurosurgery;  Laterality: Left;  LEFT Lumbar Three-Four Laminotomy foraminotomy microdiskectomy   MANDIBLE SURGERY  under bite   PVC Ablation  2010   ROBOTIC ASSISTED TOTAL HYSTERECTOMY WITH BILATERAL SALPINGO OOPHERECTOMY Bilateral 08/21/2018   Procedure: XI ROBOTIC ASSISTED TOTAL HYSTERECTOMY WITH BILATERAL SALPINGO OOPHORECTOMY;  Surgeon: Adolphus Birchwood, MD;  Location: [facility];  Service: Gynecology;  Laterality: Bilateral;   SPINE SURGERY     TONSILLECTOMY     UTERINE ARTERY EMBOLIZATION  2008   for uterine bleeding   VASCULAR SURGERY     laser of left leg   Social History   Social History Narrative   Works as a Engineer, civil (consulting) at [employer] History  Administered Date(s) Administered   Influenza,inj,Quad PF,6+ Mos 06/14/2019   Influenza-Unspecified 05/05/2021   PFIZER(Purple Top)SARS-COV-2 Vaccination 11/08/2019, 11/30/2019, 08/19/2020   PNEUMOCOCCAL CONJUGATE-20 08/26/2021   Tdap 02/03/2015   Zoster Recombinant(Shingrix) 08/26/2021     Objective: Vital  Signs: BP 130/84 (BP Location: Right Arm, Patient Position: Sitting, Cuff Size: Large)   Pulse 73   Resp 15   Ht 5\' 9"  (1.753 m)   Wt 271 lb 12.8 oz (123.3 kg)   BMI 40.14 kg/m    Physical Exam Vitals and nursing note reviewed.  Constitutional:      Appearance: She is well-developed.  HENT:     Head: Normocephalic and atraumatic.  Eyes:     Conjunctiva/sclera: Conjunctivae normal.  Cardiovascular:     Rate and Rhythm: Normal rate and regular rhythm.     Heart sounds: Normal heart sounds.  Pulmonary:     Effort: Pulmonary effort is normal.     Breath sounds: Normal breath sounds.  Abdominal:     General: Bowel sounds are normal.     Palpations: Abdomen is soft.  Musculoskeletal:     Cervical back: Normal range of motion.  Lymphadenopathy:     Cervical: No cervical adenopathy.  Skin:    General: Skin is warm and dry.     Capillary Refill: Capillary refill takes less than 2 seconds.  Neurological:     Mental Status: She is alert and oriented to person, place, and time.  Psychiatric:        Behavior: Behavior normal.      Musculoskeletal Exam: C-spine, thoracic spine, lumbar spine have good range of motion.  Shoulder joints, elbow joints, wrist joints, MCPs, PIPs, DIPs have good range of motion with no synovitis.  Positive Finkelstein's test-bilaterally, left greater than right.  Complete fist formation noted bilaterally.  Hip joints have good range of motion with no groin pain.  Knee joints have good range of motion no warmth or effusion.  Ankle joints have good range of motion with no tenderness or joint swelling.  No tenderness or synovitis over MTP joints.  CDAI Exam: CDAI Score: -- Patient Global: 40 / 100; Provider Global: 40 / 100 Swollen: --; Tender: -- Joint Exam 05/12/2023   No joint exam has been documented for this visit   There is currently no information documented on the homunculus. Go to the Rheumatology activity and complete the homunculus joint  exam.  Investigation: No additional findings.  Imaging: No results found.  Recent Labs: Lab Results  Component Value Date   WBC 6.7 11/17/2022   HGB 11.1 (L) 11/17/2022   PLT 275 11/17/2022   NA 138 02/09/2023   K 4.5 02/09/2023   CL 106 02/09/2023   CO2 20 02/09/2023   GLUCOSE 102 (H) 02/09/2023   BUN 17 02/09/2023   CREATININE 1.08 (H) 02/09/2023   BILITOT 0.4 11/17/2022  ALKPHOS 81 11/17/2022   AST 16 11/17/2022   ALT 11 11/17/2022   PROT 7.5 11/17/2022   ALBUMIN 4.0 11/17/2022   CALCIUM 9.3 02/09/2023   GFRAA 72 11/13/2020   QFTBGOLDPLUS NEGATIVE 01/15/2019    Speciality Comments: PLQ Eye Exam: 01/03/2023 WNL @ [facility]  Follow up in 1 year.  Procedures:  No procedures performed Allergies: Methylprednisolone, Sulfa antibiotics, Sulfonamide derivatives, and Macrobid [nitrofurantoin]   Assessment / Plan:     Visit Diagnoses: Rheumatoid arthritis with rheumatoid factor of multiple sites without organ or systems involvement (HCC) - RF 74, CCP >250 ,Sed rate 45, ANA 1: 320 cytoplasmic, positive synovitis: She has no synovitis on examination today.  She has not had any signs or symptoms of a rheumatoid arthritis flare.  She has clinically been doing well taking Plaquenil 200 mg 1 tablet by mouth twice daily.  She is tolerating Plaquenil without any side effects and has not missed any doses recently.  She has been started on anastrozole but has not noticed any increased generalized arthralgias.  She presents today with some increased pain in both wrist joints consistent with de Quervain's tenosynovitis x 1 month.  Different treatment options were discussed.  She will notify us if her symptoms persist or worsen.  She will remain on plaquenil as prescribed. She was advised to notify us if she develops any new or worsening symptoms.   - Plan: hydroxychloroquine (PLAQUENIL) 200 MG tablet  High risk medication use - Plaquenil 200 mg 1 tablet by mouth twice daily.  -Started in May 2020.  PLQ Eye Exam normal on 01/03/23.   CBC updated on 11/17/22. BMP updated on 02/09/23.  Orders for CBC and CMP released today.  - Plan: COMPLETE METABOLIC PANEL WITH GFR, CBC with Differential/Platelet  Elevated serum creatinine - Creatinine 1.08 and GFR low-57 on 02/10/23.  CMP with GFR updated today.  Plan: COMPLETE METABOLIC PANEL WITH GFR  Chronic pain of both shoulders: She has good range of motion of both shoulder joints on examination today.  No warmth or effusion noted.  Trigger finger, left ring finger: Resolved   Trigger middle finger of right hand: Resolved   Trigger index finger of right hand: Resolved   Primary osteoarthritis of both hands: No tenderness or synovitis noted on examination today.  Complete fist formation bilaterally.  Trochanteric bursitis of both hips: Not currently symptomatic.  Primary osteoarthritis of both knees: Good range of motion of both knee joints on examination today.  No warmth or effusion noted.  Primary osteoarthritis of both feet: She is not experiencing any increased discomfort in her feet at this time.  She is good range of motion of both ankle joints with no tenderness or joint swelling.  Positive ANA (antinuclear antibody) - ENA, C3-C4, anticardiolipin, lupus anticoagulant, beta-2 GP 1-were all negative.  She has no other clinical features of autoimmune disease.  De Quervain's disease (radial styloid tenosynovitis): Positive Finkelstein's test bilaterally.  Patient presents today with symptoms consistent de Quervain's tenosynovitis of both wrists.  Her symptoms started about 1 month ago.  Left worse than right.   Different treatment options were discussed including topical agents and bracing. If her symptoms persist or worsen discussed the possibility of an ultrasound guided injection if needed.   Other medical conditions are listed as follows:   History of pulmonary embolism  History of DVT (deep vein  thrombosis)  Hyperlipidemia with target LDL less than 130  History of anemia  PVC's (premature ventricular contractions)  Essential hypertension: BP was 130/84 today in the office.    History of atrial fibrillation  Vitamin D deficiency    Orders: Orders Placed This Encounter  Procedures   COMPLETE METABOLIC PANEL WITH GFR   CBC with Differential/Platelet   Meds ordered this encounter  Medications   hydroxychloroquine (PLAQUENIL) 200 MG tablet    Sig: Take 1 tablet (200 mg total) by mouth 2 (two) times daily.    Dispense:  180 tablet    Refill:  0     Follow-Up Instructions: Return in about 5 months (around 10/12/2023) for Rheumatoid arthritis.   Gearldine Bienenstock, PA-C  Note - This record has been created using Dragon software.  Chart creation errors have been sought, but may not always  have been located. Such creation errors do not reflect on  the standard of medical care.

## 2023-05-12 ENCOUNTER — Encounter: Payer: Self-pay | Admitting: Physician Assistant

## 2023-05-12 ENCOUNTER — Ambulatory Visit: Payer: Medicare HMO | Attending: Physician Assistant | Admitting: Physician Assistant

## 2023-05-12 VITALS — BP 130/84 | HR 73 | Resp 15 | Ht 69.0 in | Wt 271.8 lb

## 2023-05-12 DIAGNOSIS — Z79899 Other long term (current) drug therapy: Secondary | ICD-10-CM | POA: Diagnosis not present

## 2023-05-12 DIAGNOSIS — M7061 Trochanteric bursitis, right hip: Secondary | ICD-10-CM | POA: Diagnosis not present

## 2023-05-12 DIAGNOSIS — M65321 Trigger finger, right index finger: Secondary | ICD-10-CM

## 2023-05-12 DIAGNOSIS — M19071 Primary osteoarthritis, right ankle and foot: Secondary | ICD-10-CM

## 2023-05-12 DIAGNOSIS — M65342 Trigger finger, left ring finger: Secondary | ICD-10-CM

## 2023-05-12 DIAGNOSIS — M19042 Primary osteoarthritis, left hand: Secondary | ICD-10-CM

## 2023-05-12 DIAGNOSIS — M0579 Rheumatoid arthritis with rheumatoid factor of multiple sites without organ or systems involvement: Secondary | ICD-10-CM

## 2023-05-12 DIAGNOSIS — R7989 Other specified abnormal findings of blood chemistry: Secondary | ICD-10-CM

## 2023-05-12 DIAGNOSIS — M25512 Pain in left shoulder: Secondary | ICD-10-CM

## 2023-05-12 DIAGNOSIS — M25511 Pain in right shoulder: Secondary | ICD-10-CM | POA: Diagnosis not present

## 2023-05-12 DIAGNOSIS — M170 Bilateral primary osteoarthritis of knee: Secondary | ICD-10-CM

## 2023-05-12 DIAGNOSIS — M65331 Trigger finger, right middle finger: Secondary | ICD-10-CM

## 2023-05-12 DIAGNOSIS — I10 Essential (primary) hypertension: Secondary | ICD-10-CM

## 2023-05-12 DIAGNOSIS — Z86711 Personal history of pulmonary embolism: Secondary | ICD-10-CM

## 2023-05-12 DIAGNOSIS — E785 Hyperlipidemia, unspecified: Secondary | ICD-10-CM

## 2023-05-12 DIAGNOSIS — M19041 Primary osteoarthritis, right hand: Secondary | ICD-10-CM | POA: Diagnosis not present

## 2023-05-12 DIAGNOSIS — M7062 Trochanteric bursitis, left hip: Secondary | ICD-10-CM

## 2023-05-12 DIAGNOSIS — E559 Vitamin D deficiency, unspecified: Secondary | ICD-10-CM

## 2023-05-12 DIAGNOSIS — Z862 Personal history of diseases of the blood and blood-forming organs and certain disorders involving the immune mechanism: Secondary | ICD-10-CM

## 2023-05-12 DIAGNOSIS — R768 Other specified abnormal immunological findings in serum: Secondary | ICD-10-CM

## 2023-05-12 DIAGNOSIS — M19072 Primary osteoarthritis, left ankle and foot: Secondary | ICD-10-CM

## 2023-05-12 DIAGNOSIS — Z86718 Personal history of other venous thrombosis and embolism: Secondary | ICD-10-CM

## 2023-05-12 DIAGNOSIS — G8929 Other chronic pain: Secondary | ICD-10-CM

## 2023-05-12 DIAGNOSIS — Z8679 Personal history of other diseases of the circulatory system: Secondary | ICD-10-CM

## 2023-05-12 DIAGNOSIS — M654 Radial styloid tenosynovitis [de Quervain]: Secondary | ICD-10-CM

## 2023-05-12 DIAGNOSIS — I493 Ventricular premature depolarization: Secondary | ICD-10-CM

## 2023-05-12 MED ORDER — HYDROXYCHLOROQUINE SULFATE 200 MG PO TABS
200.0000 mg | ORAL_TABLET | Freq: Two times a day (BID) | ORAL | 0 refills | Status: DC
Start: 2023-05-12 — End: 2023-09-19

## 2023-05-12 NOTE — Patient Instructions (Signed)
 De Quervain's Tenosynovitis  De Quervain's tenosynovitis is a condition that causes inflammation of the tendon on the thumb side of the wrist. Tendons are cords of tissue that connect bones to muscles. The tendons in the hand pass through a tunnel called a sheath. A slippery layer of tissue (synovium) lets the tendons move smoothly in the sheath. With de Quervain's tenosynovitis, the sheath swells or thickens, causing friction and pain. The condition is also called de Quervain's disease and de Quervain's syndrome. It occurs most often in women who are 67-8 years old. What are the causes? The exact cause of this condition is not known. It may be associated with overuse of the hand and wrist. What increases the risk? You are more likely to develop this condition if you: Use your hands far more than normal, especially if you repeat certain movements that involve twisting your hand or using a tight grip. Are pregnant. Are a middle-aged woman. Have rheumatoid arthritis. Have diabetes. What are the signs or symptoms? The main symptom of this condition is pain on the thumb side of the wrist. The pain may get worse when you grasp something or turn your wrist. Other symptoms may include: Pain that extends up the forearm. Swelling of your wrist and hand. Trouble moving the thumb and wrist. A sensation of snapping in the wrist. A bump filled with fluid (cyst) in the area of the pain. How is this diagnosed? This condition may be diagnosed based on: Your symptoms and medical history. A physical exam. During the exam, your health care provider may do a simple test Lourena Simmonds test) that involves pulling your thumb and wrist to see if this causes pain. You may also need to have an X-ray or ultrasound. How is this treated? Treatment for this condition may include: Avoiding any activity that causes pain and swelling. Taking medicines. Anti-inflammatory medicines and corticosteroid injections may be used  to reduce inflammation and relieve pain. Wearing a splint. Having surgery. This may be needed if other treatments do not work. Once the pain and swelling have gone down, you may start: Physical therapy. This includes exercises to improve movement and strength in your wrist and thumb. Occupational therapy. This includes adjusting how you move your wrist. Follow these instructions at home: If you have a splint: Wear the splint as told by your health care provider. Remove it only as told by your health care provider. Loosen the splint if your fingers tingle, become numb, or turn cold and blue. Keep the splint clean. If the splint is not waterproof: Do not let it get wet. Cover it with a watertight covering when you take a bath or a shower. Managing pain, stiffness, and swelling  Avoid movements and activities that cause pain and swelling in the wrist area. If directed, put ice on the painful area. This may be helpful after doing activities that involve the sore wrist. To do this: Put ice in a plastic bag. Place a towel between your skin and the bag. Leave the ice on for 20 minutes, 2-3 times a day. Remove the ice if your skin turns bright red. This is very important. If you cannot feel pain, heat, or cold, you have a greater risk of damage to the area. Move your fingers often to reduce stiffness and swelling. Raise (elevate) the injured area above the level of your heart while you are sitting or lying down. General instructions Return to your normal activities as told by your health care provider. Ask your  health care provider what activities are safe for you. Take over-the-counter and prescription medicines only as told by your health care provider. Keep all follow-up visits. This is important. Contact a health care provider if: Your pain medicine does not help. Your pain gets worse. You develop new symptoms. Summary De Quervain's tenosynovitis is a condition that causes inflammation  of the tendon on the thumb side of the wrist. The condition occurs most often in women who are 1-83 years old. The exact cause of this condition is not known. It may be associated with overuse of the hand and wrist. Treatment starts with avoiding activity that causes pain or swelling in the wrist area. Other treatments may include wearing a splint and taking medicine. Sometimes, surgery is needed. This information is not intended to replace advice given to you by your health care provider. Make sure you discuss any questions you have with your health care provider. Document Revised: 11/27/2019 Document Reviewed: 11/28/2019 Elsevier Patient Education  2024 [employer].

## 2023-05-13 LAB — COMPLETE METABOLIC PANEL WITH GFR
AG Ratio: 1.2 (calc) (ref 1.0–2.5)
ALT: 21 U/L (ref 6–29)
AST: 30 U/L (ref 10–35)
Albumin: 4.2 g/dL (ref 3.6–5.1)
Alkaline phosphatase (APISO): 85 U/L (ref 37–153)
BUN/Creatinine Ratio: 14 (calc) (ref 6–22)
BUN: 18 mg/dL (ref 7–25)
CO2: 23 mmol/L (ref 20–32)
Calcium: 9.8 mg/dL (ref 8.6–10.4)
Chloride: 106 mmol/L (ref 98–110)
Creat: 1.32 mg/dL — ABNORMAL HIGH (ref 0.50–1.05)
Globulin: 3.4 g/dL (ref 1.9–3.7)
Glucose, Bld: 88 mg/dL (ref 65–99)
Potassium: 4.8 mmol/L (ref 3.5–5.3)
Sodium: 139 mmol/L (ref 135–146)
Total Bilirubin: 0.4 mg/dL (ref 0.2–1.2)
Total Protein: 7.6 g/dL (ref 6.1–8.1)
eGFR: 44 mL/min/{1.73_m2} — ABNORMAL LOW (ref >=60)

## 2023-05-13 LAB — CBC WITH DIFFERENTIAL/PLATELET
Absolute Monocytes: 373 {cells}/uL (ref 200–950)
Basophils Absolute: 32 {cells}/uL (ref 0–200)
Basophils Relative: 0.6 %
Eosinophils Absolute: 178 {cells}/uL (ref 15–500)
Eosinophils Relative: 3.3 %
HCT: 36.4 % (ref 35.0–45.0)
Hemoglobin: 11.7 g/dL (ref 11.7–15.5)
Lymphs Abs: 724 {cells}/uL — ABNORMAL LOW (ref 850–3900)
MCH: 26.3 pg — ABNORMAL LOW (ref 27.0–33.0)
MCHC: 32.1 g/dL (ref 32.0–36.0)
MCV: 81.8 fL (ref 80.0–100.0)
MPV: 9.8 fL (ref 7.5–12.5)
Monocytes Relative: 6.9 %
Neutro Abs: 4093 {cells}/uL (ref 1500–7800)
Neutrophils Relative %: 75.8 %
Platelets: 224 10*3/uL (ref 140–400)
RBC: 4.45 10*6/uL (ref 3.80–5.10)
RDW: 13.2 % (ref 11.0–15.0)
Total Lymphocyte: 13.4 %
WBC: 5.4 10*3/uL (ref 3.8–10.8)

## 2023-05-13 NOTE — Progress Notes (Signed)
Creatinine is elevated-1.32 and GFR is low-44.  Avoid the use of NSAIDs.    Absolute lymphocytes are low.  Rest of CBC WNL.

## 2023-05-23 ENCOUNTER — Other Ambulatory Visit (HOSPITAL_COMMUNITY): Payer: Self-pay

## 2023-05-23 ENCOUNTER — Ambulatory Visit: Payer: Medicare HMO | Attending: Surgery

## 2023-05-23 ENCOUNTER — Other Ambulatory Visit: Payer: Self-pay

## 2023-05-23 VITALS — Wt 269.5 lb

## 2023-05-23 DIAGNOSIS — Z483 Aftercare following surgery for neoplasm: Secondary | ICD-10-CM | POA: Insufficient documentation

## 2023-05-23 NOTE — Therapy (Signed)
OUTPATIENT PHYSICAL THERAPY SOZO SCREENING NOTE   Patient Name: Pamela Davenport MRN: [id number] DOB:09-11-1955, 67 y.o., female Today's Date: 05/23/2023  PCP: [name], MD REFERRING PROVIDER: Harriette Bouillon, MD   PT End of Session - 05/23/23 1610     Visit Number 2   # unchanged due to screen only   PT Start Time 1607    PT Stop Time 1612    PT Time Calculation (min) 5 min    Activity Tolerance Patient tolerated treatment well    Behavior During Therapy [facility] for tasks assessed/performed             Past Medical History:  Diagnosis Date   Atypical chest pain    normal myoview 11/11/10   Blood transfusion without reported diagnosis    DVT (deep venous thrombosis) (HCC)    recurrent; chronically anticoagulated with coumadin   Dysrhythmia    Family history of anesthesia complication    Sister had decrease in respirations after surgery   Heart murmur    Herniated lumbar intervertebral disc    Hypertension    Paroxysmal atrial fibrillation (HCC)    SINCE AGE 24    PE (pulmonary embolism)    chronically anticoagulated with coumadin   Pneumonia 07/20/2012   PONV (postoperative nausea and vomiting)    Rheumatoid arthritis (HCC)    Sickle cell trait (HCC)    Sinus congestion 08/14/2018   C/O NASAL/CHEST  CONGESTION, COUGHING WITH PHLEGM, PER PATIENT , SHE IS TO BEGIN TAKING AUGMENTIN DOSE PACK TODAY;     Strep throat at age 62   The patient reports a severe strep throat infection at age 64 and   is not clear as to whether or not she may have had rheumatic fever.   Symptomatic premature ventricular contractions    improved s/p ablation   Urgency of urination    Uterine bleeding 2008   Uterine artery embolization in 2008 for uterine bleeding.    Past Surgical History:  Procedure Laterality Date   BREAST BIOPSY Left 11/08/2022   [place] LT BREAST BX W LOC DEV EA ADD LESION IMG BX SPEC US GUIDE 11/08/2022 [facility]   BREAST BIOPSY Left 11/08/2022   [place] LT BREAST  BX W LOC DEV 1ST LESION IMG BX SPEC US GUIDE 11/08/2022 [facility]   BREAST BIOPSY Left 12/17/2022   [place] LT RADIOACTIVE SEED LOC 12/17/2022 [facility]   BREAST BIOPSY Left 12/17/2022   [place] LT RADIOACTIVE SEED EA ADD LESION 12/17/2022 [facility]   BREAST BIOPSY Left 12/17/2022   [place] LT RADIOACTIVE SEED EA ADD LESION 12/17/2022 [facility]   BREAST LUMPECTOMY WITH RADIOACTIVE SEED AND SENTINEL LYMPH NODE BIOPSY Left 12/21/2022   Procedure: LEFT BREAST BRACKETED SEED LUMPECTOMY AND LEFT SENTINEL LYMPH NODE MAPPING;  Surgeon: Harriette Bouillon, MD;  Location: [facility];  Service: General;  Laterality: Left;  GEN & PEC BLOCK   COLONOSCOPY W/ POLYPECTOMY     Diagnostic D&C hysteroscopy and Novasure ablation  03/17/2004   DILATION AND CURETTAGE OF UTERUS  12/17/2011   Procedure: DILATATION AND CURETTAGE;  Surgeon: Kathreen Cosier, MD;  Location: [facility];  Service: Gynecology;  Laterality: N/A;  With Attempted Hydrothermal Ablation   LUMBAR LAMINECTOMY/DECOMPRESSION MICRODISCECTOMY Left 02/21/2013   Procedure: LUMBAR LAMINECTOMY/DECOMPRESSION MICRODISCECTOMY 1 LEVEL;  Surgeon: Carmela Hurt, MD;  Location: [facility];  Service: Neurosurgery;  Laterality: Left;  LEFT Lumbar Three-Four Laminotomy foraminotomy microdiskectomy   MANDIBLE SURGERY     under  bite   PVC Ablation  2010   ROBOTIC ASSISTED TOTAL HYSTERECTOMY WITH BILATERAL SALPINGO OOPHERECTOMY Bilateral 08/21/2018   Procedure: XI ROBOTIC ASSISTED TOTAL HYSTERECTOMY WITH BILATERAL SALPINGO OOPHORECTOMY;  Surgeon: Adolphus Birchwood, MD;  Location: [facility];  Service: Gynecology;  Laterality: Bilateral;   SPINE SURGERY     TONSILLECTOMY     UTERINE ARTERY EMBOLIZATION  2008   for uterine bleeding   VASCULAR SURGERY     laser of left leg   Patient Active Problem List   Diagnosis Date Noted   Genetic testing 11/30/2022   Family history of prostate cancer 11/19/2022   Malignant neoplasm of  lower-inner quadrant of left breast in female, estrogen receptor positive (HCC) 11/15/2022   Anemia due to acquired thiamine deficiency 05/07/2022   Deficiency anemia 05/04/2022   Gastroesophageal reflux disease with esophagitis without hemorrhage 05/04/2022   Esophageal dysphagia 05/04/2022   Intermittent palpitations 05/04/2022   OAB (overactive bladder) 12/31/2021   Encounter for general adult medical examination with abnormal findings 08/26/2021   Obesity, morbid, BMI 40.0-49.9 (HCC) 08/26/2021   PAF (paroxysmal atrial fibrillation) (HCC) 08/26/2021   GERD without esophagitis 10/04/2019   Vitamin D deficiency disease 09/25/2019   Hypercoagulable state (HCC) 09/24/2019   Iron deficiency anemia 12/07/2018   Other dietary vitamin B12 deficiency anemia 12/07/2018   Visit for screening mammogram 04/18/2017   Hyperlipidemia with target LDL less than 130 10/19/2016   Essential hypertension 01/13/2015   Chronic pulmonary embolism (HCC) 12/05/2008    REFERRING DIAG: left breast cancer at risk for lymphedema  THERAPY DIAG: Aftercare following surgery for neoplasm  PERTINENT HISTORY: Patient was diagnosed on 11/09/2022 with left grade 1 invasive ductal carcinoma breast cancer. She underwent a left lumpectomy and sentinel node biopsy on 12/21/2022. It is ER/PR positive and HER2 negative with a Ki67 of 10%.   PRECAUTIONS: left UE Lymphedema risk, None  SUBJECTIVE: Pt returns for her 3 month L-Dex screen. "The seroma finally calmed down."  PAIN:  Are you having pain? No  SOZO SCREENING: Patient was assessed today using the SOZO machine to determine the lymphedema index score. This was compared to her baseline score. It was determined that she is within the recommended range when compared to her baseline and no further action is needed at this time. She will continue SOZO screenings. These are done every 3 months for 2 years post operatively followed by every 6 months for 2 years, and then  annually.    L-DEX FLOWSHEETS - 05/23/23 1600       L-DEX LYMPHEDEMA SCREENING   Measurement Type Unilateral    L-DEX MEASUREMENT EXTREMITY Upper Extremity    POSITION  Standing    DOMINANT SIDE Right    At Risk Side Left    BASELINE SCORE (UNILATERAL) 1.3    L-DEX SCORE (UNILATERAL) 1    VALUE CHANGE (UNILAT) -0.3              Hermenia Bers, PTA 05/23/2023, 4:11 PM

## 2023-06-13 ENCOUNTER — Ambulatory Visit
Admission: RE | Admit: 2023-06-13 | Discharge: 2023-06-13 | Disposition: A | Discharge status: Home / Self Care | Payer: Medicare HMO | Source: Ambulatory Visit | Attending: Adult Health | Admitting: Adult Health

## 2023-06-13 NOTE — Progress Notes (Signed)
Radiation Oncology         [phone]) [phone] ________________________________  Name: Pamela Davenport MRN: [id number]  Date of Service: 06/13/2023  DOB: Mar 02, 1956  Post Treatment Telephone Note  Diagnosis:  Stage IA, [id number], grade 1, ER/PR positive invasive ductal carcinoma of the left breast. (as documented in provider EOT note)  The patient was available for call today.   Symptoms of fatigue have improved since completing therapy.  Symptoms of skin changes have improved since completing therapy.  The patient was encouraged to avoid sun exposure in the area of prior treatment for up to one year following radiation with either sunscreen or by the style of clothing worn in the sun.  The patient has scheduled follow up with her medical oncologist Dr. Pamelia Hoit for ongoing surveillance, and was encouraged to call if she develops concerns or questions regarding radiation.  This concludes the interaction.  Ruel Favors, LPN

## 2023-06-17 DIAGNOSIS — R350 Frequency of micturition: Secondary | ICD-10-CM | POA: Diagnosis not present

## 2023-06-17 DIAGNOSIS — N3941 Urge incontinence: Secondary | ICD-10-CM | POA: Diagnosis not present

## 2023-06-28 ENCOUNTER — Other Ambulatory Visit: Payer: Self-pay | Admitting: *Deleted

## 2023-06-28 MED ORDER — AMLODIPINE BESYLATE 10 MG PO TABS: 10.0000 mg | ORAL_TABLET | Freq: Every day | ORAL | 6 refills | Status: DC

## 2023-07-03 NOTE — Progress Notes (Deleted)
  Electrophysiology Office Note:   Date:  07/03/2023  ID:  Pamela Davenport, DOB October 12, 1955, MRN [id number]  Primary Cardiologist: None Electrophysiologist: Lanier Prude, MD  {Click to update primary MD,subspecialty MD or APP then REFRESH:1}    History of Present Illness:   Pamela Davenport is a 67 y.o. female with h/o PAF, PVCs, and HTN seen today for routine electrophysiology followup.   Since last being seen in our clinic the patient reports doing ***.  she denies chest pain, palpitations, dyspnea, PND, orthopnea, nausea, vomiting, dizziness, syncope, edema, weight gain, or early satiety.   Review of systems complete and found to be negative unless listed in HPI.   EP Information / Studies Reviewed:    EKG is ordered today. Personal review as below.       Echo 07/2021 LVEF 55-60%, mild MR  Myoview 07/2021 Normal, low risk study without evidence of ischemia or infarction.  Physical Exam:   VS:  There were no vitals taken for this visit.   Wt Readings from Last 3 Encounters:  05/23/23 269 lb 8 oz (122.2 kg)  05/12/23 271 lb 12.8 oz (123.3 kg)  04/05/23 274 lb 1.6 oz (124.3 kg)     GEN: Well nourished, well developed in no acute distress NECK: No JVD; No carotid bruits CARDIAC: {[id number]}, no murmurs, rubs, gallops RESPIRATORY:  Clear to auscultation without rales, wheezing or rhonchi  ABDOMEN: Soft, non-tender, non-distended EXTREMITIES:  No edema; No deformity   ASSESSMENT AND PLAN:    Paroxysmal atrial fibrillation High risk medication monitoring - Flecainide EKG today shows *** Continue Xarelto for CHA2DS2/VASc of at least 5. PR and QRS duration remain acceptable for continued flecainide use as needed. Continue nadolol  PVCs Continue nadolol  HTN Stable on current regimen    {Click here to Review PMH, Prob List, Meds, Allergies, SHx, FHx  :1}   Follow up with EP APP in 6 months  Signed, Graciella Freer, PA-C

## 2023-07-04 ENCOUNTER — Inpatient Hospital Stay: Payer: Medicare HMO | Attending: Hematology and Oncology | Admitting: Adult Health

## 2023-07-04 ENCOUNTER — Ambulatory Visit: Payer: Medicare HMO | Admitting: Student

## 2023-07-04 DIAGNOSIS — I480 Paroxysmal atrial fibrillation: Secondary | ICD-10-CM

## 2023-07-04 DIAGNOSIS — Z800 Family history of malignant neoplasm of digestive organs: Secondary | ICD-10-CM | POA: Insufficient documentation

## 2023-07-04 DIAGNOSIS — M069 Rheumatoid arthritis, unspecified: Secondary | ICD-10-CM | POA: Insufficient documentation

## 2023-07-04 DIAGNOSIS — Z808 Family history of malignant neoplasm of other organs or systems: Secondary | ICD-10-CM | POA: Insufficient documentation

## 2023-07-04 DIAGNOSIS — Z923 Personal history of irradiation: Secondary | ICD-10-CM | POA: Insufficient documentation

## 2023-07-04 DIAGNOSIS — Z79811 Long term (current) use of aromatase inhibitors: Secondary | ICD-10-CM | POA: Insufficient documentation

## 2023-07-04 DIAGNOSIS — R531 Weakness: Secondary | ICD-10-CM | POA: Insufficient documentation

## 2023-07-04 DIAGNOSIS — I493 Ventricular premature depolarization: Secondary | ICD-10-CM

## 2023-07-04 DIAGNOSIS — I10 Essential (primary) hypertension: Secondary | ICD-10-CM

## 2023-07-04 DIAGNOSIS — Z87891 Personal history of nicotine dependence: Secondary | ICD-10-CM | POA: Insufficient documentation

## 2023-07-04 DIAGNOSIS — J189 Pneumonia, unspecified organism: Secondary | ICD-10-CM | POA: Insufficient documentation

## 2023-07-04 DIAGNOSIS — Z8042 Family history of malignant neoplasm of prostate: Secondary | ICD-10-CM | POA: Insufficient documentation

## 2023-07-04 DIAGNOSIS — Z170 Estrogen receptor positive status [ER+]: Secondary | ICD-10-CM | POA: Insufficient documentation

## 2023-07-04 DIAGNOSIS — Z807 Family history of other malignant neoplasms of lymphoid, hematopoietic and related tissues: Secondary | ICD-10-CM | POA: Insufficient documentation

## 2023-07-04 DIAGNOSIS — Z79899 Other long term (current) drug therapy: Secondary | ICD-10-CM | POA: Insufficient documentation

## 2023-07-04 DIAGNOSIS — C50312 Malignant neoplasm of lower-inner quadrant of left female breast: Secondary | ICD-10-CM | POA: Insufficient documentation

## 2023-07-04 NOTE — Progress Notes (Deleted)
SURVIVORSHIP VISIT:  BRIEF ONCOLOGIC HISTORY:  Oncology History  Malignant neoplasm of lower-inner quadrant of left breast in female, estrogen receptor positive (HCC)  11/08/2022 Surgery   Screening mammogram detected left breast masses at 8 o'clock position: 1.1 cm: Grade 1 IDC ER 95%, PR 80%, HER2 negative 0.7 cm mass: Grade 1 IDC ER 100%, PR 90%, HER2 negative, Ki-67 10%, multiple lymph nodes 1 was biopsied and benign discordant   11/15/2022 Initial Diagnosis   Primary malignant neoplasm of lower outer quadrant of female breast, left (HCC)   11/15/2022 Cancer Staging   Staging form: Breast, AJCC 8th Edition - Clinical stage from 11/15/2022: Stage IA (cT1c, cN0(f), cM0, G1, ER+, PR+, HER2-) - Signed by Ronny Bacon, PA-C on 11/15/2022 Stage prefix: Initial diagnosis Method of lymph node assessment: Core biopsy Histologic grading system: 3 grade system    Genetic Testing   [facility] Custom Cancer Panel+RNA was Negative. Report date is 11/29/2022.  The Custom Hereditary Cancers Panel offered by [facility] includes sequencing and/or deletion duplication testing of the following 43 genes: APC, ATM, AXIN2, BAP1, BARD1, BMPR1A, BRCA1, BRCA2, BRIP1, CDH1, CDK4, CDKN2A (p14ARF and p16INK4a only), CHEK2, CTNNA1, EPCAM (Deletion/duplication testing only), FH, GREM1 (promoter region duplication testing only), HOXB13, KIT, MBD4, MEN1, MLH1, MSH2, MSH3, MSH6, MUTYH, NF1, NHTL1, PALB2, PDGFRA, PMS2, POLD1, POLE, PTEN, RAD51C, RAD51D, SMAD4, SMARCA4. STK11, TP53, TSC1, TSC2, and VHL.   12/21/2022 Surgery   Left lumpectomy: 2 foci IDC 1 with lobular features 1 cm and 0.9 cm grade 1, grade 2 DCIS, LCIS, margins negative, LVI present, ER 95%, PR 80%, HER2 2+ by IHC, FISH negative, Ki-67 10% 1/4 lymph nodes positive, another lymph node with micrometastasis   01/04/2023 Cancer Staging   Staging form: Breast, AJCC 8th Edition - Pathologic: Stage IA (pT1b, pN1(sn), cM0, G2, ER+, PR+, HER2-) - Signed by Serena Croissant, MD on 01/04/2023 Method of lymph node assessment: Sentinel lymph node biopsy Histologic grading system: 3 grade system   02/17/2023 - 04/06/2023 Radiation Therapy   Plan Name: Breast_L_BH Site: Breast, Left Technique: 3D Mode: Photon Dose Per Fraction: 1.8 Gy Prescribed Dose (Delivered / Prescribed): 18 Gy / 18 Gy Prescribed Fxs (Delivered / Prescribed): 10 / 10   Plan Name: Breast_L_BH:1 Site: Breast, Left Technique: 3D Mode: Photon Dose Per Fraction: 1.8 Gy Prescribed Dose (Delivered / Prescribed): 32.4 Gy / 32.4 Gy Prescribed Fxs (Delivered / Prescribed): 18 / 18   Plan Name: Brst_L_Scv_BH Site: Sclav-LT Technique: 3D Mode: Photon Dose Per Fraction: 1.8 Gy Prescribed Dose (Delivered / Prescribed): 50.4 Gy / 50.4 Gy Prescribed Fxs (Delivered / Prescribed): 28 / 28   Plan Name: Brst_L_Bst_BH Site: Breast, Left Technique: 3D Mode: Photon Dose Per Fraction: 2 Gy Prescribed Dose (Delivered / Prescribed): 10 Gy / 10 Gy Prescribed Fxs (Delivered / Prescribed): 5 / 5     03/2023 -  Anti-estrogen oral therapy   Anastrozole x 7 years     INTERVAL HISTORY:  Pamela Davenport to review her survivorship care plan detailing her treatment course for breast cancer, as well as monitoring long-term side effects of that treatment, education regarding health maintenance, screening, and overall wellness and health promotion.     Overall, Pamela Davenport reports feeling quite well   REVIEW OF SYSTEMS:  Review of Systems - Oncology Breast: Denies any new nodularity, masses, tenderness, nipple changes, or nipple discharge.       PAST MEDICAL/SURGICAL HISTORY:  Past Medical History:  Diagnosis Date  . Atypical chest pain  normal myoview 11/11/10  . Blood transfusion without reported diagnosis   . DVT (deep venous thrombosis) (HCC)    recurrent; chronically anticoagulated with coumadin  . Dysrhythmia   . Family history of anesthesia complication    Sister had decrease in  respirations after surgery  . Heart murmur   . Herniated lumbar intervertebral disc   . Hypertension   . Paroxysmal atrial fibrillation (HCC)    SINCE AGE 67   . PE (pulmonary embolism)    chronically anticoagulated with coumadin  . Pneumonia 07/20/2012  . PONV (postoperative nausea and vomiting)   . Rheumatoid arthritis (HCC)   . Sickle cell trait (HCC)   . Sinus congestion 08/14/2018   C/O NASAL/CHEST  CONGESTION, COUGHING WITH PHLEGM, PER PATIENT , SHE IS TO BEGIN TAKING AUGMENTIN DOSE PACK TODAY;    . Strep throat at age 67   The patient reports a severe strep throat infection at age 67 and   is not clear as to whether or not she may have had rheumatic fever.  . Symptomatic premature ventricular contractions    improved s/p ablation  . Urgency of urination   . Uterine bleeding 2008   Uterine artery embolization in 2008 for uterine bleeding.    Past Surgical History:  Procedure Laterality Date  . BREAST BIOPSY Left 11/08/2022   [place] LT BREAST BX W LOC DEV EA ADD LESION IMG BX SPEC US GUIDE 11/08/2022 [facility]  . BREAST BIOPSY Left 11/08/2022   [place] LT BREAST BX W LOC DEV 1ST LESION IMG BX SPEC US GUIDE 11/08/2022 [facility]  . BREAST BIOPSY Left 12/17/2022   [place] LT RADIOACTIVE SEED LOC 12/17/2022 [facility]  . BREAST BIOPSY Left 12/17/2022   [place] LT RADIOACTIVE SEED EA ADD LESION 12/17/2022 [facility]  . BREAST BIOPSY Left 12/17/2022   [place] LT RADIOACTIVE SEED EA ADD LESION 12/17/2022 [facility]  . BREAST LUMPECTOMY WITH RADIOACTIVE SEED AND SENTINEL LYMPH NODE BIOPSY Left 12/21/2022   Procedure: LEFT BREAST BRACKETED SEED LUMPECTOMY AND LEFT SENTINEL LYMPH NODE MAPPING;  Surgeon: Harriette Bouillon, MD;  Location: [facility];  Service: General;  Laterality: Left;  GEN & PEC BLOCK  . COLONOSCOPY W/ POLYPECTOMY    . Diagnostic D&C hysteroscopy and Novasure ablation  03/17/2004  . DILATION AND CURETTAGE OF UTERUS  12/17/2011   Procedure:  DILATATION AND CURETTAGE;  Surgeon: Kathreen Cosier, MD;  Location: [facility];  Service: Gynecology;  Laterality: N/A;  With Attempted Hydrothermal Ablation  . LUMBAR LAMINECTOMY/DECOMPRESSION MICRODISCECTOMY Left 02/21/2013   Procedure: LUMBAR LAMINECTOMY/DECOMPRESSION MICRODISCECTOMY 1 LEVEL;  Surgeon: Carmela Hurt, MD;  Location: [facility];  Service: Neurosurgery;  Laterality: Left;  LEFT Lumbar Three-Four Laminotomy foraminotomy microdiskectomy  . MANDIBLE SURGERY     under bite  . PVC Ablation  2010  . ROBOTIC ASSISTED TOTAL HYSTERECTOMY WITH BILATERAL SALPINGO OOPHERECTOMY Bilateral 08/21/2018   Procedure: XI ROBOTIC ASSISTED TOTAL HYSTERECTOMY WITH BILATERAL SALPINGO OOPHORECTOMY;  Surgeon: Adolphus Birchwood, MD;  Location: [facility] [place];  Service: Gynecology;  Laterality: Bilateral;  . SPINE SURGERY    . TONSILLECTOMY    . UTERINE ARTERY EMBOLIZATION  2008   for uterine bleeding  . VASCULAR SURGERY     laser of left leg     ALLERGIES:  Allergies  Allergen Reactions  . Methylprednisolone     REACTION: Atrial fibrillation at end of medrol dose pack  . Sulfa Antibiotics Hives  . Sulfonamide Derivatives Hives  . Macrobid [Nitrofurantoin]  Muscle tightening and cramps      CURRENT MEDICATIONS:  Outpatient Encounter Medications as of 07/04/2023  Medication Sig  . ALPRAZolam (XANAX) 0.5 MG tablet Take 1 tablet (0.5 mg total) by mouth 2 (two) times daily. (Patient taking differently: Take 0.5 mg by mouth as needed.)  . amLODipine (NORVASC) 10 MG tablet Take 1 tablet (10 mg total) by mouth at bedtime.  Marland Kitchen anastrozole (ARIMIDEX) 1 MG tablet Take 1 tablet (1 mg total) by mouth daily.  Marland Kitchen esomeprazole (NEXIUM) 40 MG capsule Take 1 capsule (40 mg total) by mouth daily. (Patient not taking: Reported on 05/12/2023)  . flecainide (TAMBOCOR) 150 MG tablet Take 2 tablets by mouth at the onset of AFIB  . hydroxychloroquine (PLAQUENIL) 200 MG tablet Take 1 tablet (200 mg total) by  mouth 2 (two) times daily.  . hydrOXYzine (ATARAX) 25 MG tablet Take 1 tablet (25 mg total) by mouth 3 (three) times daily as needed.  . irbesartan (AVAPRO) 300 MG tablet Take 1 tablet (300 mg total) by mouth daily.  . nadolol (CORGARD) 20 MG tablet Take 1 tablet (20 mg total) by mouth daily.  Marland Kitchen nystatin-triamcinolone ointment (MYCOLOG) Apply to affected areas two times a day  . rivaroxaban (XARELTO) 20 MG TABS tablet Take 1 tablet (20 mg total) by mouth daily with supper.  . solifenacin (VESICARE) 5 MG tablet Take 5 mg by mouth daily.  Marland Kitchen TART CHERRY PO Take 2 tablets by mouth daily.  Marland Kitchen thiamine (VITAMIN B-1) 50 MG tablet Take 1 tablet (50 mg total) by mouth daily.   No facility-administered encounter medications on file as of 07/04/2023.     ONCOLOGIC FAMILY HISTORY:  Family History  Problem Relation Age of Onset  . Hypertension Mother   . Diabetes Mother   . Uterine cancer Sister 39  . Prostate cancer Brother 65  . Prostate cancer Brother 53  . Prostate cancer Brother   . Prostate cancer Maternal Uncle   . Prostate cancer Maternal Uncle   . Non-Hodgkin's lymphoma Paternal Aunt 69  . Pancreatic cancer Paternal Aunt 53  . Colon cancer Paternal Grandmother 16  . Healthy Son   . Breast cancer Neg Hx      SOCIAL HISTORY:  Social History   Socioeconomic History  . Marital status: Single    Spouse name: Not on file  . Number of children: Not on file  . Years of education: Not on file  . Highest education level: Not on file  Occupational History  . Occupation: Nurse  Tobacco Use  . Smoking status: Former    Current packs/day: 0.00    Average packs/day: 1 pack/day for 2.0 years (2.0 ttl pk-yrs)    Types: Cigarettes    Start date: 02/12/1977    Quit date: 02/13/1979    Years since quitting: 44.4    Passive exposure: Never  . Smokeless tobacco: Never  Vaping Use  . Vaping status: Never Used  Substance and Sexual Activity  . Alcohol use: Yes    Comment: rarely  . Drug  use: No  . Sexual activity: Not on file  Other Topics Concern  . Not on file  Social History Narrative   Works as a Engineer, civil (consulting) at [employer]   Social Determinants of Health   Financial Resource Strain: Not on file  Food Insecurity: No Food Insecurity (01/19/2023)   Hunger Vital Sign   . Worried About Programme researcher, broadcasting/film/video in the Last Year: Never true   . Ran Out  of Food in the Last Year: Never true  Transportation Needs: No Transportation Needs (01/19/2023)   PRAPARE - Transportation   . Lack of Transportation (Medical): No   . Lack of Transportation (Non-Medical): No  Physical Activity: Not on file  Stress: Not on file  Social Connections: Not on file  Intimate Partner Violence: Not At Risk (01/19/2023)   Humiliation, Afraid, Rape, and Kick questionnaire   . Fear of Current or Ex-Partner: No   . Emotionally Abused: No   . Physically Abused: No   . Sexually Abused: No     OBSERVATIONS/OBJECTIVE:  There were no vitals taken for this visit. GENERAL: Patient is a well appearing female in no acute distress HEENT:  Sclerae anicteric.  Oropharynx clear and moist. No ulcerations or evidence of oropharyngeal candidiasis. Neck is supple.  NODES:  No cervical, supraclavicular, or axillary lymphadenopathy palpated.  BREAST EXAM:  left breast s/p lumpectomy and radiation, no sign of local recurrence, right breast benign LUNGS:  Clear to auscultation bilaterally.  No wheezes or rhonchi. HEART:  Regular rate and rhythm. No murmur appreciated. ABDOMEN:  Soft, nontender.  Positive, normoactive bowel sounds. No organomegaly palpated. MSK:  No focal spinal tenderness to palpation. Full range of motion bilaterally in the upper extremities. EXTREMITIES:  No peripheral edema.   SKIN:  Clear with no obvious rashes or skin changes. No nail dyscrasia. NEURO:  Nonfocal. Well oriented.  Appropriate affect.   LABORATORY DATA:  None for this visit.  DIAGNOSTIC IMAGING:  None for this visit.       ASSESSMENT AND PLAN:  Pamela Davenport is a pleasant 67 y.o. female with Stage 1A left breast invasive ductal carcinoma, ER+/PR+/HER2-, diagnosed in March 2024, treated with lumpectomy, adjuvant radiation therapy, and anti-estrogen therapy with Anastrozole beginning in 03/2023.  She presents to the Survivorship Clinic for our initial meeting and routine follow-up post-completion of treatment for breast cancer.    1. Stage 1A left breast cancer:  Pamela Davenport is continuing to recover from definitive treatment for breast cancer. She will follow-up with her medical oncologist, Dr. Pamelia Hoit with history and physical exam per surveillance protocol.  She will continue her anti-estrogen therapy with anastrozole. Thus far, she is tolerating the anastrozole well, with minimal side effects. Her mammogram is due 10/2023; orders placed today.   Today, a comprehensive survivorship care plan and treatment summary was reviewed with the patient today detailing her breast cancer diagnosis, treatment course, potential late/long-term effects of treatment, appropriate follow-up care with recommendations for the future, and patient education resources.  A copy of this summary, along with a letter will be sent to the patient's primary care provider via mail/fax/In Basket message after today's visit.    #. Problem(s) at Visit______________  #. Bone health:  Given Pamela Davenport's age/history of breast cancer and her current treatment regimen including anti-estrogen therapy with anastrozole, she is at risk for bone demineralization.  Her last DEXA scan occurred August 11, 2015 demonstrating a normal bone density.  She is recommended to undergo repeat testing. She was given education on specific activities to promote bone health.  #. Cancer screening:  Due to Pamela Davenport's history and her age, she should receive screening for skin cancers, colon cancer, and gynecologic cancers.  The information and recommendations are listed on the  patient's comprehensive care plan/treatment summary and were reviewed in detail with the patient.    #. Health maintenance and wellness promotion: Pamela Davenport was encouraged to consume 5-7 servings of fruits and vegetables  per day. We reviewed the "Nutrition Rainbow" handout.  She was also encouraged to engage in moderate to vigorous exercise for 30 minutes per day most days of the week.  She was instructed to limit her alcohol consumption and continue to abstain from tobacco use.     #. Support services/counseling: It is not uncommon for this period of the patient's cancer care trajectory to be one of many emotions and stressors.   She was given information regarding our available services and encouraged to contact me with any questions or for help enrolling in any of our support group/programs.    Follow up instructions:    -Return to cancer center in 6 months for f/u with Dr. Pamelia Hoit -Mammogram due in 10/2023 -She is welcome to return back to the Survivorship Clinic at any time; no additional follow-up needed at this time.  -Consider referral back to survivorship as a long-term survivor for continued surveillance  The patient was provided an opportunity to ask questions and all were answered. The patient agreed with the plan and demonstrated an understanding of the instructions.   Total encounter time:*** minutes*in face-to-face visit time, chart review, lab review, care coordination, order entry, and documentation of the encounter time.    Lillard Anes, NP 07/04/23 1:42 PM Medical Oncology and Hematology [facility] [address] [place], [place] [address] Tel. [phone]    Fax. [phone]  *Total Encounter Time as defined by the Centers for Medicare and Medicaid Services includes, in addition to the face-to-face time of a patient visit (documented in the note above) non-face-to-face time: obtaining and reviewing outside history, ordering and reviewing medications,  tests or procedures, care coordination (communications with other health care professionals or caregivers) and documentation in the medical record.

## 2023-07-06 ENCOUNTER — Telehealth: Payer: Self-pay | Admitting: Adult Health

## 2023-07-06 ENCOUNTER — Ambulatory Visit
Admission: EM | Admit: 2023-07-06 | Discharge: 2023-07-06 | Disposition: A | Discharge status: Home / Self Care | Payer: Medicare HMO | Attending: Family Medicine | Admitting: Family Medicine

## 2023-07-06 DIAGNOSIS — J101 Influenza due to other identified influenza virus with other respiratory manifestations: Secondary | ICD-10-CM | POA: Diagnosis not present

## 2023-07-06 DIAGNOSIS — R509 Fever, unspecified: Secondary | ICD-10-CM

## 2023-07-06 DIAGNOSIS — R059 Cough, unspecified: Secondary | ICD-10-CM

## 2023-07-06 DIAGNOSIS — R531 Weakness: Secondary | ICD-10-CM | POA: Diagnosis not present

## 2023-07-06 LAB — POC SARS CORONAVIRUS 2 AG -  ED: SARS Coronavirus 2 Ag: NEGATIVE

## 2023-07-06 LAB — POCT INFLUENZA A/B
Influenza A, POC: NEGATIVE
Influenza B, POC: POSITIVE — AB

## 2023-07-06 MED ORDER — HYDROCODONE BIT-HOMATROP MBR 5-1.5 MG/5ML PO SOLN: 5.0000 mL | Freq: Four times a day (QID) | ORAL | 0 refills | Status: DC | PRN

## 2023-07-06 MED ORDER — OSELTAMIVIR PHOSPHATE 75 MG PO CAPS
75.0000 mg | ORAL_CAPSULE | Freq: Two times a day (BID) | ORAL | 0 refills | Status: DC
Start: 2023-07-06 — End: 2023-07-19

## 2023-07-06 MED ORDER — BENZONATATE 200 MG PO CAPS: 200.0000 mg | ORAL_CAPSULE | Freq: Three times a day (TID) | ORAL | 0 refills | Status: AC | PRN

## 2023-07-06 NOTE — ED Triage Notes (Addendum)
Pt c/o fever, headache and weakness x 1 week. Tmax 100.0 on Sat evening. Taking tylenol prn.  COVID neg at home on Sat.

## 2023-07-06 NOTE — Discharge Instructions (Addendum)
Advised patient to take medication as directed with food to completion.  Advised may take Tessalon Perles daily or as needed for cough.  Advised may use Hycodan at night for cough prior to sleep due to sedative effects.  Advised may use OTC Tylenol 1 g every 6 hours for fever (oral temperature greater than 100.3).  Encouraged to increase daily water intake to 64 ounces per day while taking these medications.  Advised if symptoms worsen and/or unresolved please follow-up with PCP or here for further evaluation.

## 2023-07-06 NOTE — Telephone Encounter (Signed)
Received a voicemail from patient, unable to make appt on 11/4. Patient stats was running a fever. Called patient today, and she was currently in the emergency room. Patient sates will call back when feeling better to make appt.

## 2023-07-06 NOTE — ED Provider Notes (Signed)
[facility]    CSN: [id number] Arrival date & time: 07/06/23  1315      History   Chief Complaint Chief Complaint  Patient presents with   Weakness   Fever   Headache    HPI MARYLENE MASEK is a 67 y.o. female.   HPI 67 year old female presents with fever, headache and weakness for 1 week.  Reports temperature max was 100.0 on Saturday evening.  Reports taking Tylenol as needed.  Patient reports COVID 19 was negative at home on Saturday.  PMH significant for morbid obesity, RA, PAF, PE, and history of DVT.  Patient is currently on rivaroxaban and denies any unusual bleeding.  Past Medical History:  Diagnosis Date   Atypical chest pain    normal myoview 11/11/10   Blood transfusion without reported diagnosis    DVT (deep venous thrombosis) (HCC)    recurrent; chronically anticoagulated with coumadin   Dysrhythmia    Family history of anesthesia complication    Sister had decrease in respirations after surgery   Heart murmur    Herniated lumbar intervertebral disc    Hypertension    Paroxysmal atrial fibrillation (HCC)    SINCE AGE 67    PE (pulmonary embolism)    chronically anticoagulated with coumadin   Pneumonia 07/20/2012   PONV (postoperative nausea and vomiting)    Rheumatoid arthritis (HCC)    Sickle cell trait (HCC)    Sinus congestion 08/14/2018   C/O NASAL/CHEST  CONGESTION, COUGHING WITH PHLEGM, PER PATIENT , SHE IS TO BEGIN TAKING AUGMENTIN DOSE PACK TODAY;     Strep throat at age 52   The patient reports a severe strep throat infection at age 18 and   is not clear as to whether or not she may have had rheumatic fever.   Symptomatic premature ventricular contractions    improved s/p ablation   Urgency of urination    Uterine bleeding 2008   Uterine artery embolization in 2008 for uterine bleeding.     Patient Active Problem List   Diagnosis Date Noted   Genetic testing 11/30/2022   Family history of prostate cancer 11/19/2022    Malignant neoplasm of lower-inner quadrant of left breast in female, estrogen receptor positive (HCC) 11/15/2022   Anemia due to acquired thiamine deficiency 05/07/2022   Deficiency anemia 05/04/2022   Gastroesophageal reflux disease with esophagitis without hemorrhage 05/04/2022   Esophageal dysphagia 05/04/2022   Intermittent palpitations 05/04/2022   OAB (overactive bladder) 12/31/2021   Encounter for general adult medical examination with abnormal findings 08/26/2021   Obesity, morbid, BMI 40.0-49.9 (HCC) 08/26/2021   PAF (paroxysmal atrial fibrillation) (HCC) 08/26/2021   GERD without esophagitis 10/04/2019   Vitamin D deficiency disease 09/25/2019   Hypercoagulable state (HCC) 09/24/2019   Iron deficiency anemia 12/07/2018   Other dietary vitamin B12 deficiency anemia 12/07/2018   Visit for screening mammogram 04/18/2017   Hyperlipidemia with target LDL less than 130 10/19/2016   Essential hypertension 01/13/2015   Chronic pulmonary embolism (HCC) 12/05/2008    Past Surgical History:  Procedure Laterality Date   BREAST BIOPSY Left 11/08/2022   [place] LT BREAST BX W LOC DEV EA ADD LESION IMG BX SPEC US GUIDE 11/08/2022 [facility]   BREAST BIOPSY Left 11/08/2022   [place] LT BREAST BX W LOC DEV 1ST LESION IMG BX SPEC US GUIDE 11/08/2022 [facility]   BREAST BIOPSY Left 12/17/2022   [place] LT RADIOACTIVE SEED LOC 12/17/2022 [facility]   BREAST BIOPSY Left 12/17/2022  [place] LT RADIOACTIVE SEED EA ADD LESION 12/17/2022 [facility]   BREAST BIOPSY Left 12/17/2022   [place] LT RADIOACTIVE SEED EA ADD LESION 12/17/2022 [facility]   BREAST LUMPECTOMY WITH RADIOACTIVE SEED AND SENTINEL LYMPH NODE BIOPSY Left 12/21/2022   Procedure: LEFT BREAST BRACKETED SEED LUMPECTOMY AND LEFT SENTINEL LYMPH NODE MAPPING;  Surgeon: Harriette Bouillon, MD;  Location: [facility];  Service: General;  Laterality: Left;  GEN & PEC BLOCK   COLONOSCOPY W/ POLYPECTOMY     Diagnostic D&C  hysteroscopy and Novasure ablation  03/17/2004   DILATION AND CURETTAGE OF UTERUS  12/17/2011   Procedure: DILATATION AND CURETTAGE;  Surgeon: Kathreen Cosier, MD;  Location: [facility];  Service: Gynecology;  Laterality: N/A;  With Attempted Hydrothermal Ablation   LUMBAR LAMINECTOMY/DECOMPRESSION MICRODISCECTOMY Left 02/21/2013   Procedure: LUMBAR LAMINECTOMY/DECOMPRESSION MICRODISCECTOMY 1 LEVEL;  Surgeon: Carmela Hurt, MD;  Location: [facility];  Service: Neurosurgery;  Laterality: Left;  LEFT Lumbar Three-Four Laminotomy foraminotomy microdiskectomy   MANDIBLE SURGERY     under bite   PVC Ablation  2010   ROBOTIC ASSISTED TOTAL HYSTERECTOMY WITH BILATERAL SALPINGO OOPHERECTOMY Bilateral 08/21/2018   Procedure: XI ROBOTIC ASSISTED TOTAL HYSTERECTOMY WITH BILATERAL SALPINGO OOPHORECTOMY;  Surgeon: Adolphus Birchwood, MD;  Location: [facility];  Service: Gynecology;  Laterality: Bilateral;   SPINE SURGERY     TONSILLECTOMY     UTERINE ARTERY EMBOLIZATION  2008   for uterine bleeding   VASCULAR SURGERY     laser of left leg    OB History   No obstetric history on file.      Home Medications    Prior to Admission medications   Medication Sig Start Date End Date Taking? Authorizing Provider  benzonatate (TESSALON) 200 MG capsule Take 1 capsule (200 mg total) by mouth 3 (three) times daily as needed for up to 7 days. 07/06/23 07/13/23 Yes Trevor Iha, FNP  HYDROcodone bit-homatropine (HYCODAN) 5-1.5 MG/5ML syrup Take 5 mLs by mouth every 6 (six) hours as needed for cough. 07/06/23  Yes Trevor Iha, FNP  oseltamivir (TAMIFLU) 75 MG capsule Take 1 capsule (75 mg total) by mouth every 12 (twelve) hours. 07/06/23  Yes Trevor Iha, FNP  ALPRAZolam Prudy Feeler) 0.5 MG tablet Take 1 tablet (0.5 mg total) by mouth 2 (two) times daily. Patient taking differently: Take 0.5 mg by mouth as needed. 11/22/22   Brock Bad, MD  amLODipine (NORVASC) 10 MG tablet Take 1 tablet (10 mg  total) by mouth at bedtime. 06/28/23   Lanier Prude, MD  anastrozole (ARIMIDEX) 1 MG tablet Take 1 tablet (1 mg total) by mouth daily. 04/05/23   Serena Croissant, MD  esomeprazole (NEXIUM) 40 MG capsule Take 1 capsule (40 mg total) by mouth daily. Patient not taking: Reported on 05/12/2023 01/27/23   [name], MD  flecainide [facility]) 150 MG tablet Take 2 tablets by mouth at the onset of AFIB 06/22/21   Graciella Freer, PA-C  hydroxychloroquine (PLAQUENIL) 200 MG tablet Take 1 tablet (200 mg total) by mouth 2 (two) times daily. 05/12/23   Gearldine Bienenstock, PA-C  hydrOXYzine (ATARAX) 25 MG tablet Take 1 tablet (25 mg total) by mouth 3 (three) times daily as needed. 08/19/22   Brock Bad, MD  irbesartan (AVAPRO) 300 MG tablet Take 1 tablet (300 mg total) by mouth daily. 01/27/23   Lanier Prude, MD  nadolol (CORGARD) 20 MG tablet Take 1 tablet (20 mg total) by mouth  daily. 07/30/22   Hillis Range, MD  nystatin-triamcinolone ointment [facility]) Apply to affected areas two times a day 08/09/21     rivaroxaban (XARELTO) 20 MG TABS tablet Take 1 tablet (20 mg total) by mouth daily with supper. 07/30/22 08/03/23  Allred, Fayrene Fearing, MD  solifenacin (VESICARE) 5 MG tablet Take 5 mg by mouth daily.    [provider]  TART CHERRY PO Take 2 tablets by mouth daily.    [provider]  thiamine (VITAMIN B-1) 50 MG tablet Take 1 tablet (50 mg total) by mouth daily. 05/07/22   [name], MD    Family History Family History  Problem Relation Age of Onset   Hypertension Mother    Diabetes Mother    Uterine cancer Sister 46   Prostate cancer Brother 67   Prostate cancer Brother 24   Prostate cancer Brother    Prostate cancer Maternal Uncle    Prostate cancer Maternal Uncle    Non-Hodgkin's lymphoma Paternal Aunt 33   Pancreatic cancer Paternal Aunt 58   Colon cancer Paternal Grandmother 18   Healthy Son    Breast cancer Neg Hx     Social History Social History    Tobacco Use   Smoking status: Former    Current packs/day: 0.00    Average packs/day: 1 pack/day for 2.0 years (2.0 ttl pk-yrs)    Types: Cigarettes    Start date: 02/12/1977    Quit date: 02/13/1979    Years since quitting: 44.4    Passive exposure: Never   Smokeless tobacco: Never  Vaping Use   Vaping status: Never Used  Substance Use Topics   Alcohol use: Yes    Comment: rarely   Drug use: No     Allergies   Methylprednisolone, Sulfa antibiotics, Sulfonamide derivatives, and Macrobid [nitrofurantoin]   Review of Systems Review of Systems   Physical Exam Triage Vital Signs ED Triage Vitals  Encounter Vitals Group     BP 07/06/23 1340 137/85     Systolic BP Percentile --      Diastolic BP Percentile --      Pulse Rate 07/06/23 1340 83     Resp 07/06/23 1340 17     Temp 07/06/23 1340 99 F (37.2 C)     Temp Source 07/06/23 1340 Oral     SpO2 07/06/23 1340 96 %     Weight --      Height --      Head Circumference --      Peak Flow --      Pain Score 07/06/23 1341 8     Pain Loc --      Pain Education --      Exclude from Growth Chart --    No data found.  Updated Vital Signs BP 137/85 (BP Location: Right Arm)   Pulse 83   Temp 99 F (37.2 C) (Oral)   Resp 17   SpO2 96%    Physical Exam Vitals and nursing note reviewed.  Constitutional:      Appearance: Normal appearance. She is normal weight. She is not ill-appearing.  HENT:     Head: Normocephalic and atraumatic.     Right Ear: Tympanic membrane, ear canal and external ear normal.     Left Ear: Tympanic membrane, ear canal and external ear normal.     Mouth/Throat:     Mouth: Mucous membranes are moist.     Pharynx: Oropharynx is clear.  Eyes:  Extraocular Movements: Extraocular movements intact.     Conjunctiva/sclera: Conjunctivae normal.     Pupils: Pupils are equal, round, and reactive to light.  Cardiovascular:     Rate and Rhythm: Normal rate and regular rhythm.     Pulses:  Normal pulses.     Heart sounds: Normal heart sounds.  Pulmonary:     Effort: Pulmonary effort is normal.     Breath sounds: Normal breath sounds. No wheezing, rhonchi or rales.     Comments: Infrequent nonproductive cough noted on exam Musculoskeletal:        General: Normal range of motion.     Cervical back: Normal range of motion and neck supple.  Skin:    General: Skin is warm and dry.  Neurological:     General: No focal deficit present.     Mental Status: She is alert and oriented to person, place, and time. Mental status is at baseline.  Psychiatric:        Mood and Affect: Mood normal.        Behavior: Behavior normal.      UC Treatments / Results  Labs (all labs ordered are listed, but only abnormal results are displayed) Labs Reviewed  POCT INFLUENZA A/B - Abnormal; Notable for the following components:      Result Value   Influenza B, POC Positive (*)    All other components within normal limits  POC SARS CORONAVIRUS 2 AG -  ED    EKG   Radiology No results found.  Procedures Procedures (including critical care time)  Medications Ordered in UC Medications - No data to display  Initial Impression / Assessment and Plan / UC Course  I have reviewed the triage vital signs and the nursing notes.  Pertinent labs & imaging results that were available during my care of the patient were reviewed by me and considered in my medical decision making (see chart for details).     MDM: 1.  Influenza B-Rx'd Tamiflu 75 mg capsule: Take 1 capsule twice daily x 5 days; 2.  Cough, unspecified type-Rx'd Tessalon 200 mg capsules: Take 1 capsule 3 times daily, as needed, Rx'd Hycodan 5-1.5 mg / 5 mL syrup: Take 5 mL by mouth every 6 hours as needed for cough; 3.  Fever, unspecified-advised patient may take OTC Tylenol 1 g every 6 hours for fever (oral temperature greater than 100.3).Advised patient to take medication as directed with food to completion.  Advised may take  Tessalon Perles daily or as needed for cough.  Advised may use Hycodan at night for cough prior to sleep due to sedative effects.  Advised may use OTC Tylenol 1 g every 6 hours for fever (oral temperature greater than 100.3).  Encouraged to increase daily water intake to 64 ounces per day while taking these medications.  Advised if symptoms worsen and/or unresolved please follow-up with PCP or here for further evaluation.  Final Clinical Impressions(s) / UC Diagnoses   Final diagnoses:  Influenza B  Cough, unspecified type  Fever, unspecified     Discharge Instructions      Advised patient to take medication as directed with food to completion.  Advised may take Tessalon Perles daily or as needed for cough.  Advised may use Hycodan at night for cough prior to sleep due to sedative effects.  Advised may use OTC Tylenol 1 g every 6 hours for fever (oral temperature greater than 100.3).  Encouraged to increase daily water intake to 64 ounces per  day while taking these medications.  Advised if symptoms worsen and/or unresolved please follow-up with PCP or here for further evaluation.     ED Prescriptions     Medication Sig Dispense Auth. Provider   benzonatate (TESSALON) 200 MG capsule Take 1 capsule (200 mg total) by mouth 3 (three) times daily as needed for up to 7 days. 40 capsule Trevor Iha, FNP   HYDROcodone bit-homatropine (HYCODAN) 5-1.5 MG/5ML syrup Take 5 mLs by mouth every 6 (six) hours as needed for cough. 120 mL Trevor Iha, FNP   oseltamivir (TAMIFLU) 75 MG capsule Take 1 capsule (75 mg total) by mouth every 12 (twelve) hours. 10 capsule Trevor Iha, FNP      I have reviewed the PDMP during this encounter.   Trevor Iha, FNP 07/06/23 1440

## 2023-07-17 ENCOUNTER — Encounter (HOSPITAL_COMMUNITY): Payer: Self-pay

## 2023-07-17 ENCOUNTER — Emergency Department (HOSPITAL_COMMUNITY)
Admission: EM | Admit: 2023-07-17 | Discharge: 2023-07-17 | Disposition: A | Discharge status: Home / Self Care | Payer: Medicare HMO | Attending: Emergency Medicine | Admitting: Emergency Medicine

## 2023-07-17 ENCOUNTER — Emergency Department (HOSPITAL_COMMUNITY): Payer: Medicare HMO

## 2023-07-17 DIAGNOSIS — N6320 Unspecified lump in the left breast, unspecified quadrant: Secondary | ICD-10-CM | POA: Diagnosis not present

## 2023-07-17 DIAGNOSIS — M2550 Pain in unspecified joint: Secondary | ICD-10-CM | POA: Diagnosis not present

## 2023-07-17 DIAGNOSIS — J168 Pneumonia due to other specified infectious organisms: Secondary | ICD-10-CM | POA: Diagnosis not present

## 2023-07-17 DIAGNOSIS — R591 Generalized enlarged lymph nodes: Secondary | ICD-10-CM | POA: Diagnosis not present

## 2023-07-17 DIAGNOSIS — M79603 Pain in arm, unspecified: Secondary | ICD-10-CM | POA: Diagnosis not present

## 2023-07-17 DIAGNOSIS — J189 Pneumonia, unspecified organism: Secondary | ICD-10-CM

## 2023-07-17 DIAGNOSIS — R531 Weakness: Secondary | ICD-10-CM

## 2023-07-17 DIAGNOSIS — C50919 Malignant neoplasm of unspecified site of unspecified female breast: Secondary | ICD-10-CM | POA: Diagnosis not present

## 2023-07-17 DIAGNOSIS — R918 Other nonspecific abnormal finding of lung field: Secondary | ICD-10-CM | POA: Diagnosis not present

## 2023-07-17 DIAGNOSIS — R0781 Pleurodynia: Secondary | ICD-10-CM | POA: Diagnosis not present

## 2023-07-17 DIAGNOSIS — R079 Chest pain, unspecified: Secondary | ICD-10-CM | POA: Diagnosis not present

## 2023-07-17 HISTORY — DX: Malignant (primary) neoplasm, unspecified: C80.1

## 2023-07-17 LAB — COMPREHENSIVE METABOLIC PANEL
ALT: 45 U/L — ABNORMAL HIGH (ref 0–44)
AST: 43 U/L — ABNORMAL HIGH (ref 15–41)
Albumin: 3 g/dL — ABNORMAL LOW (ref 3.5–5.0)
Alkaline Phosphatase: 79 U/L (ref 38–126)
Anion gap: 9 (ref 5–15)
BUN: 18 mg/dL (ref 8–23)
CO2: 21 mmol/L — ABNORMAL LOW (ref 22–32)
Calcium: 9.2 mg/dL (ref 8.9–10.3)
Chloride: 105 mmol/L (ref 98–111)
Creatinine, Ser: 1.32 mg/dL — ABNORMAL HIGH (ref 0.44–1.00)
GFR, Estimated: 44 mL/min — ABNORMAL LOW (ref >60)
Glucose, Bld: 105 mg/dL — ABNORMAL HIGH (ref 70–99)
Potassium: 5 mmol/L (ref 3.5–5.1)
Sodium: 135 mmol/L (ref 135–145)
Total Bilirubin: 0.6 mg/dL (ref <1.2)
Total Protein: 7.9 g/dL (ref 6.5–8.1)

## 2023-07-17 LAB — URINALYSIS, ROUTINE W REFLEX MICROSCOPIC
Bilirubin Urine: NEGATIVE
Glucose, UA: NEGATIVE mg/dL
Hgb urine dipstick: NEGATIVE
Ketones, ur: NEGATIVE mg/dL
Leukocytes,Ua: NEGATIVE
Nitrite: NEGATIVE
Protein, ur: NEGATIVE mg/dL
Specific Gravity, Urine: 1.008 (ref 1.005–1.030)
pH: 8 (ref 5.0–8.0)

## 2023-07-17 LAB — CBC WITH DIFFERENTIAL/PLATELET
Abs Immature Granulocytes: 0.06 10*3/uL (ref 0.00–0.07)
Basophils Absolute: 0.1 10*3/uL (ref 0.0–0.1)
Basophils Relative: 1 %
Eosinophils Absolute: 0.3 10*3/uL (ref 0.0–0.5)
Eosinophils Relative: 4 %
HCT: 30.5 % — ABNORMAL LOW (ref 36.0–46.0)
Hemoglobin: 10.2 g/dL — ABNORMAL LOW (ref 12.0–15.0)
Immature Granulocytes: 1 %
Lymphocytes Relative: 6 %
Lymphs Abs: 0.5 10*3/uL — ABNORMAL LOW (ref 0.7–4.0)
MCH: 27.5 pg (ref 26.0–34.0)
MCHC: 33.4 g/dL (ref 30.0–36.0)
MCV: 82.2 fL (ref 80.0–100.0)
Monocytes Absolute: 0.6 10*3/uL (ref 0.1–1.0)
Monocytes Relative: 7 %
Neutro Abs: 7.5 10*3/uL (ref 1.7–7.7)
Neutrophils Relative %: 81 %
Platelets: 465 10*3/uL — ABNORMAL HIGH (ref 150–400)
RBC: 3.71 MIL/uL — ABNORMAL LOW (ref 3.87–5.11)
RDW: 13.3 % (ref 11.5–15.5)
WBC: 9 10*3/uL (ref 4.0–10.5)
nRBC: 0 % (ref 0.0–0.2)

## 2023-07-17 LAB — SEDIMENTATION RATE: Sed Rate: 122 mm/h — ABNORMAL HIGH (ref 0–22)

## 2023-07-17 LAB — C-REACTIVE PROTEIN: CRP: 18 mg/dL — ABNORMAL HIGH (ref <1.0)

## 2023-07-17 LAB — CK: Total CK: 40 U/L (ref 38–234)

## 2023-07-17 LAB — MAGNESIUM: Magnesium: 2.7 mg/dL — ABNORMAL HIGH (ref 1.7–2.4)

## 2023-07-17 MED ORDER — AMOXICILLIN-POT CLAVULANATE 875-125 MG PO TABS
1.0000 | ORAL_TABLET | Freq: Once | ORAL | Status: AC
  Administered 2023-07-17: 1 via ORAL
  Filled 2023-07-17: qty 1

## 2023-07-17 MED ORDER — DIPHENHYDRAMINE HCL 50 MG/ML IJ SOLN
12.5000 mg | Freq: Once | INTRAMUSCULAR | Status: AC
  Administered 2023-07-17: 12.5 mg via INTRAVENOUS
  Filled 2023-07-17: qty 1

## 2023-07-17 MED ORDER — PREDNISONE 20 MG PO TABS
20.0000 mg | ORAL_TABLET | Freq: Once | ORAL | Status: AC
Start: 2023-07-17 — End: 2023-07-17
  Administered 2023-07-17: 20 mg via ORAL
  Filled 2023-07-17: qty 1

## 2023-07-17 MED ORDER — SODIUM CHLORIDE 0.9 % IV SOLN
1.0000 g | Freq: Once | INTRAVENOUS | Status: DC
  Filled 2023-07-17: qty 10

## 2023-07-17 MED ORDER — ACETAMINOPHEN 500 MG PO TABS
1000.0000 mg | ORAL_TABLET | Freq: Once | ORAL | Status: AC
Start: 2023-07-17 — End: 2023-07-17
  Administered 2023-07-17: 1000 mg via ORAL
  Filled 2023-07-17: qty 2

## 2023-07-17 MED ORDER — METOCLOPRAMIDE HCL 5 MG/ML IJ SOLN
10.0000 mg | Freq: Once | INTRAMUSCULAR | Status: AC
  Administered 2023-07-17: 10 mg via INTRAVENOUS
  Filled 2023-07-17: qty 2

## 2023-07-17 MED ORDER — PREDNISONE 5 MG PO TABS: ORAL_TABLET | ORAL | 0 refills | Status: DC

## 2023-07-17 MED ORDER — IOHEXOL 350 MG/ML SOLN
75.0000 mL | Freq: Once | INTRAVENOUS | Status: AC | PRN
  Administered 2023-07-17: 75 mL via INTRAVENOUS

## 2023-07-17 MED ORDER — AZITHROMYCIN 250 MG PO TABS
500.0000 mg | ORAL_TABLET | Freq: Once | ORAL | Status: AC
  Administered 2023-07-17: 500 mg via ORAL
  Filled 2023-07-17: qty 2

## 2023-07-17 MED ORDER — SODIUM CHLORIDE 0.9 % IV BOLUS
1000.0000 mL | Freq: Once | INTRAVENOUS | Status: AC
  Administered 2023-07-17: 1000 mL via INTRAVENOUS

## 2023-07-17 MED ORDER — AZITHROMYCIN 250 MG PO TABS: 250.0000 mg | ORAL_TABLET | Freq: Every day | ORAL | 0 refills | Status: DC

## 2023-07-17 MED ORDER — AZITHROMYCIN 250 MG PO TABS: 250.0000 mg | ORAL_TABLET | Freq: Every day | ORAL | 0 refills | Status: AC

## 2023-07-17 MED ORDER — DEXTROSE 5 % IV SOLN
500.0000 mg | Freq: Once | INTRAVENOUS | Status: DC
  Filled 2023-07-17: qty 5

## 2023-07-17 MED ORDER — AMOXICILLIN-POT CLAVULANATE 875-125 MG PO TABS: 1.0000 | ORAL_TABLET | Freq: Two times a day (BID) | ORAL | 0 refills | Status: DC

## 2023-07-17 NOTE — Discharge Instructions (Signed)
You were seen in the emergency department today for joint pain and fatigue.  As we discussed the CT of your chest looked concerning for possible pneumonia.  I discussed your image with Dr. Al Pimple who works closely with Dr Pamelia Hoit about the image and the plan.  They recommended starting you on some antibiotics and have you follow-up closely in their office.  I have sent a message to let Dr. Pamelia Hoit know!  I have also sent some prednisone for your joint pain.  Continue to monitor how you're doing and return to the ER for new or worsening symptoms.

## 2023-07-17 NOTE — ED Notes (Signed)
PA aware that pt visitor is requesting pain medication and wanting to talk to someone regarding the POC,

## 2023-07-17 NOTE — ED Provider Notes (Signed)
[place] EMERGENCY DEPARTMENT AT [facility] Provider Note   CSN: [id number] Arrival date & time: 07/17/23  1036     History  Chief Complaint  Patient presents with   Arm Pain   Rib Cage Pain   Weakness    Pamela Davenport is a 67 y.o. female.  Patient with past history of morbid obesity, RA, PAF, PE/DVT, breast CA diagnosed March 2024 s/p radiation complicated by start of fatigue towards end of radiation treatments, subsequently started on anastroxole (self-discontinued over the past 4 days), COVID Aug 2024, Flu B 2-3 weeks ago tx tamiflu --presents to the ED today for evaluation of generalized joint pains and generalized weakness.  This has really been going on for several months but has been worsening recently and she decided today to come in to get checked.  She reports generalized joint pain but worse in her hands and right foot.  The pain is migratory.  She has been trying some electrolyte drinks at home more recently.  Patient reports compliance with her Xarelto.  She reports some mild shortness of breath with activity but overall her symptoms are more of like a generalized fatigue more than air hunger.  She denies vomiting or diarrhea.  Her medications make her constipated so she has been taking milk of magnesia.  For RA she takes hydroxychloroquine.  For A-fib she takes flecainide as needed in addition to her blood thinner.  She had fevers with her diagnosis of influenza, however these have resolved.  She reports being on steroids in the past for RA, but not currently.  Patient was previously a Engineer, civil (consulting), working in several fields.  She retired at the beginning of this year.       Home Medications Prior to Admission medications   Medication Sig Start Date End Date Taking? Authorizing Provider  ALPRAZolam Prudy Feeler) 0.5 MG tablet Take 1 tablet (0.5 mg total) by mouth 2 (two) times daily. Patient taking differently: Take 0.5 mg by mouth as needed. 11/22/22   Brock Bad, MD  amLODipine (NORVASC) 10 MG tablet Take 1 tablet (10 mg total) by mouth at bedtime. 06/28/23   Lanier Prude, MD  anastrozole (ARIMIDEX) 1 MG tablet Take 1 tablet (1 mg total) by mouth daily. 04/05/23   Serena Croissant, MD  esomeprazole (NEXIUM) 40 MG capsule Take 1 capsule (40 mg total) by mouth daily. Patient not taking: Reported on 05/12/2023 01/27/23   [name], MD  flecainide [facility]) 150 MG tablet Take 2 tablets by mouth at the onset of AFIB 06/22/21   Graciella Freer, PA-C  HYDROcodone bit-homatropine [facility]) 5-1.5 MG/5ML syrup Take 5 mLs by mouth every 6 (six) hours as needed for cough. 07/06/23   Trevor Iha, FNP  hydroxychloroquine (PLAQUENIL) 200 MG tablet Take 1 tablet (200 mg total) by mouth 2 (two) times daily. 05/12/23   Gearldine Bienenstock, PA-C  hydrOXYzine (ATARAX) 25 MG tablet Take 1 tablet (25 mg total) by mouth 3 (three) times daily as needed. 08/19/22   Brock Bad, MD  irbesartan (AVAPRO) 300 MG tablet Take 1 tablet (300 mg total) by mouth daily. 01/27/23   Lanier Prude, MD  nadolol (CORGARD) 20 MG tablet Take 1 tablet (20 mg total) by mouth daily. 07/30/22   Allred, Fayrene Fearing, MD  nystatin-triamcinolone ointment [facility]) Apply to affected areas two times a day 08/09/21     oseltamivir (TAMIFLU) 75 MG capsule Take 1 capsule (75 mg total) by mouth every 12 (twelve)  hours. 07/06/23   Trevor Iha, FNP  rivaroxaban (XARELTO) 20 MG TABS tablet Take 1 tablet (20 mg total) by mouth daily with supper. 07/30/22 08/05/23  Allred, Fayrene Fearing, MD  solifenacin (VESICARE) 5 MG tablet Take 5 mg by mouth daily.    [provider]  TART CHERRY PO Take 2 tablets by mouth daily.    [provider]  thiamine (VITAMIN B-1) 50 MG tablet Take 1 tablet (50 mg total) by mouth daily. 05/07/22   [name], MD      Allergies    Methylprednisolone, Sulfa antibiotics, Sulfonamide derivatives, and Macrobid [nitrofurantoin]    Review of Systems   Review of  Systems  Physical Exam Updated Vital Signs BP 126/77 (BP Location: Right Arm)   Pulse 81   Temp 99.1 F (37.3 C) (Oral)   Resp 18   Ht 5\' 9"  (1.753 m)   Wt 122 kg   SpO2 99%   BMI 39.72 kg/m   Physical Exam Vitals and nursing note reviewed.  Constitutional:      Appearance: She is well-developed. She is not diaphoretic.  HENT:     Head: Normocephalic and atraumatic.     Right Ear: External ear normal.     Left Ear: External ear normal.     Nose: Nose normal.     Mouth/Throat:     Mouth: Mucous membranes are moist. Mucous membranes are not dry.     Pharynx: Uvula midline.  Eyes:     General: Lids are normal.     Extraocular Movements:     Right eye: No nystagmus.     Left eye: No nystagmus.     Conjunctiva/sclera: Conjunctivae normal.     Pupils: Pupils are equal, round, and reactive to light.  Neck:     Vascular: Normal carotid pulses. No JVD.     Trachea: Trachea normal. No tracheal deviation.  Cardiovascular:     Rate and Rhythm: Normal rate and regular rhythm.     Pulses: No decreased pulses.          Radial pulses are 2+ on the right side and 2+ on the left side.     Heart sounds: Normal heart sounds, S1 normal and S2 normal. No murmur heard. Pulmonary:     Effort: Pulmonary effort is normal. No respiratory distress.     Breath sounds: Normal breath sounds. No wheezing.  Chest:     Chest wall: No tenderness.  Abdominal:     General: Bowel sounds are normal.     Palpations: Abdomen is soft.     Tenderness: There is no abdominal tenderness. There is no guarding or rebound.  Musculoskeletal:        General: Normal range of motion.     Cervical back: Normal range of motion and neck supple. No tenderness or bony tenderness. No muscular tenderness.     Right lower leg: No edema.     Left lower leg: No edema.     Comments: Patient has generalized tenderness to palpation over the bilateral hands as well as right foot.  No significant swelling or erythema.  2+  radial pulses and pedal pulses bilaterally.  Skin:    General: Skin is warm and dry.     Coloration: Skin is not pale.  Neurological:     Mental Status: She is alert and oriented to person, place, and time.     GCS: GCS eye subscore is 4. GCS verbal subscore is 5. GCS motor subscore  is 6.     Cranial Nerves: No cranial nerve deficit.     Sensory: No sensory deficit.     Motor: Weakness (No focal, generalized) present.     Coordination: Coordination normal.     Comments: Upper extremity myotomes tested bilaterally:  C5 Shoulder abduction 5/5 C6 Elbow flexion/wrist extension 5/5 C7 Elbow extension 5/5 C8 Finger flexion 5/5 T1 Finger abduction 5/5  Lower extremity myotomes tested bilaterally: L2 Hip flexion 5/5 L3 Knee extension 5/5 L4 Ankle dorsiflexion 5/5 S1 Ankle plantar flexion 5/5      ED Results / Procedures / Treatments   Labs (all labs ordered are listed, but only abnormal results are displayed) Labs Reviewed  CBC WITH DIFFERENTIAL/PLATELET - Abnormal; Notable for the following components:      Result Value   RBC 3.71 (*)    Hemoglobin 10.2 (*)    HCT 30.5 (*)    Platelets 465 (*)    Lymphs Abs 0.5 (*)    All other components within normal limits  COMPREHENSIVE METABOLIC PANEL - Abnormal; Notable for the following components:   CO2 21 (*)    Glucose, Bld 105 (*)    Creatinine, Ser 1.32 (*)    Albumin 3.0 (*)    AST 43 (*)    ALT 45 (*)    GFR, Estimated 44 (*)    All other components within normal limits  SEDIMENTATION RATE - Abnormal; Notable for the following components:   Sed Rate 122 (*)    All other components within normal limits  MAGNESIUM - Abnormal; Notable for the following components:   Magnesium 2.7 (*)    All other components within normal limits  CK  C-REACTIVE PROTEIN  URINALYSIS, ROUTINE W REFLEX MICROSCOPIC    EKG EKG Interpretation Date/Time:  Sunday July 17 2023 10:47:08 EST Ventricular Rate:  80 PR Interval:  167 QRS  Duration:  100 QT Interval:  371 QTC Calculation: 428 R Axis:   91  Text Interpretation: Sinus rhythm Consider left atrial enlargement Consider right ventricular hypertrophy Borderline T abnormalities, anterior leads Confirmed by Kristine Royal [phone]) on 07/17/2023 1:50:59 PM  Radiology DG Chest Portable 1 View  Result Date: 07/17/2023 CLINICAL DATA:  Weakness.  Chest pain. EXAM: PORTABLE CHEST 1 VIEW COMPARISON:  Two-view chest x-ray 10/01/2021 FINDINGS: The heart is enlarged. Diffuse airspace opacity is present over the peripheral left upper lobe along the chest wall. No osseous erosions are present. Atherosclerotic calcifications are present at the aortic arch. The right lung is clear. IMPRESSION: Diffuse airspace opacity over the peripheral left upper lobe along the chest wall. This is concerning for pneumonia or possibly tumor in this patient with breast cancer. Recommend CT of the chest with contrast for further evaluation. These results were called by telephone at the time of interpretation on 07/17/2023 at 12:54 pm to provider SHERI UPSTILL, who verbally acknowledged these results. Electronically Signed   By: Marin Roberts M.D.   On: 07/17/2023 12:57    Procedures Procedures    Medications Ordered in ED Medications - No data to display  ED Course/ Medical Decision Making/ A&P    Patient seen and examined. History obtained directly from patient.   Labs/EKG: Ordered CBC, CMP, CK, inflammatory markers, UA.  EKG.  Imaging: Ordered chest x-ray.  Medications/Fluids: Ordered: Fluid bolus.   Most recent vital signs reviewed and are as follows: BP 126/77 (BP Location: Right Arm)   Pulse 81   Temp 99.1 F (37.3 C) (Oral)   Resp  18   Ht 5\' 9"  (1.753 m)   Wt 122 kg   SpO2 99%   BMI 39.72 kg/m   Initial impression: Patient with generalized weakness for several months but worsening recently.  She has had several recent infections.  No acute changes today, but is looking  for some answers as to why she feels so bad.  She does not have any concerns about going home if workup is reassuring.  2:31 PM Reassessment performed. Patient appears stable.  She is requesting something for headache.  Reglan/Benadryl ordered.  Labs personally reviewed and interpreted including: CBC with normal white blood cell count, hemoglobin slightly low at 10.2 which appears to be slightly lower than her baseline anemia; CMP with minimally elevated transaminases, creatinine mildly elevated at 1.32 with BUN of 18, glucose 105, borderline high potassium at 5.0; CK normal at 40; sed rate elevated 122; magnesium 2.7.   Imaging personally visualized and interpreted including: Chest x-ray, agree left sided opacity.  Reviewed pertinent lab work and imaging with patient at bedside. Questions answered.   Most current vital signs reviewed and are as follows: BP 126/77 (BP Location: Right Arm)   Pulse 81   Temp 99.1 F (37.3 C) (Oral)   Resp 18   Ht 5\' 9"  (1.753 m)   Wt 122 kg   SpO2 99%   BMI 39.72 kg/m   Plan: Patient will need CTA for further delineation of the abnormal chest x-ray.  3:21 PM Signout to Roemhildt PA-C at shift change.   Patient currently awaiting CTA of the chest for further evaluation of abnormality seen on chest x-ray in the determine if this could be contributing to her generalized weakness.  Did discuss with patient trialing a course of prednisone for her generalized joint pains.  This will require follow-up with PCP as outpatient as well.                                Medical Decision Making Amount and/or Complexity of Data Reviewed Labs: ordered. Radiology: ordered.  Risk Prescription drug management.   Patient with history as above, currently in midst of workup.  She has generalized weakness, no focal weakness or concern for stroke.  This has been a gradually worsening problem, exacerbated by several illnesses over the past several months.  Patient is  anticoagulated.  She has an abnormal chest x-ray, awaiting further evaluation.  She does have a history of rheumatoid arthritis.  She takes hydroxychloroquine for this.  She may be having a flare given migratory joint pains and elevated inflammatory markers.  Consider steroid treatment and outpatient follow-up with her primary care team.  Currently do not suspect joint infection.        Final Clinical Impression(s) / ED Diagnoses Final diagnoses:  Generalized weakness  Arthralgia, unspecified joint    Rx / DC Orders ED Discharge Orders     None         Renne Crigler, PA-C 07/17/23 1524    Wynetta Fines, MD 07/18/23 1353

## 2023-07-17 NOTE — ED Provider Notes (Signed)
Accepted handoff at shift change from [facility]. Please see prior provider note for full HPI.  Briefly: Patient is a 67 y.o. female who presents to the ER for weakness, arm pain, rib pain since July. Hx of breast cancer s/p radiation (follows with Dr Pamelia Hoit) and RA.   DDX/Plan: Has abnormal CXR with concern for opacity over peripheral left upper lobe along chest wall concerning for pneumonia or tumor. Plan for chest CT. Also has elevated ESR, likely due to RA flare. Consider giving prednisone if discharging.   Physical Exam  BP 126/77 (BP Location: Right Arm)   Pulse 81   Temp 99.1 F (37.3 C) (Oral)   Resp 18   Ht 5\' 9"  (1.753 m)   Wt 122 kg   SpO2 99%   BMI 39.72 kg/m   Physical Exam Vitals and nursing note reviewed.  Constitutional:      Appearance: Normal appearance.  HENT:     Head: Normocephalic and atraumatic.  Eyes:     Conjunctiva/sclera: Conjunctivae normal.  Pulmonary:     Effort: Pulmonary effort is normal. No respiratory distress.  Skin:    General: Skin is warm and dry.  Neurological:     Mental Status: She is alert.  Psychiatric:        Mood and Affect: Mood normal.        Behavior: Behavior normal.     ED Course / MDM    Medical Decision Making Amount and/or Complexity of Data Reviewed Labs: ordered. Radiology: ordered.  Risk Prescription drug management.  CT chest showed evidence of dense peripheral airspace consolidation left upper lobe and lingula, no evidence of PE.   I consulted with oncologist Dr. Al Pimple who reviewed patient's image.  She felt that it was unlikely this was related to malignancy.  Felt it was more likely pneumonia.  Agreed that we should treat her for pneumonia in the outpatient setting, and have her follow closely with her primary oncology team.  We do not feel that she required admission for this.  I discussed this with the patient, her and her daughter are agreeable to this.  Will initiate her on outpatient antibiotics,  give prednisone for rheumatoid arthritis flare, and have her follow-up with oncology.  There was a documented reaction to methylprednisolone in the past, but patient states that she normally tolerates prednisone, and I confirmed this in chart review.  Patient discharged in stable condition all questions answered.   Jeanella Flattery 07/17/23 1833    Derwood Kaplan, MD 07/18/23 2228

## 2023-07-17 NOTE — ED Triage Notes (Signed)
Pt c/o increasing weakness, arm pain, and rib cage pain since July.  Pain score 8/10.  Pt reports she had last radiation in July and was started on a new medication.  Pt thinks new medication is causing her RA to flare.   Hx of L breast CA.  Pt has recently been diagnosed w/ the flu.

## 2023-07-18 ENCOUNTER — Telehealth: Payer: Self-pay

## 2023-07-18 NOTE — Telephone Encounter (Signed)
Contacted pt per MD to schedule SCP with NP. Pt is in agreement and is coming in 07/19/23 at 215.

## 2023-07-18 NOTE — Telephone Encounter (Signed)
-----   Message from Tamsen Meek sent at 07/18/2023  7:34 AM EST ----- Regarding: RE: Follow Up Thank you.  Vernona Rieger, Can you make sure she gets a SCP visit with Mardella Layman? She can discuss the scans when she sees her Thanks VG ----- Message ----- From: Jeanella Flattery Sent: 07/17/2023   5:58 PM EST To: Serena Croissant, MD Subject: Follow Up                                      Dr Pamelia Hoit,  I had the pleasure of seeing your patient Ms Valdespino in the ER this evening. She had some imaging done which showed a consolidation in the left upper lobe of her lung. I reviewed the imaging with Dr Al Pimple who felt this would be unlikely to be malignancy, but we agreed to initiate possible pneumonia treatment and have her follow up with you.  I just wanted to make you aware!  Thanks, Lorin Roemhildt, PA-C [facility], [facility] [email or web address]

## 2023-07-19 ENCOUNTER — Encounter: Payer: Self-pay | Admitting: Adult Health

## 2023-07-19 ENCOUNTER — Inpatient Hospital Stay (HOSPITAL_BASED_OUTPATIENT_CLINIC_OR_DEPARTMENT_OTHER): Payer: Medicare HMO | Admitting: Adult Health

## 2023-07-19 VITALS — BP 133/80 | HR 87 | Temp 98.0°F | Resp 18 | Ht 69.0 in | Wt 264.4 lb

## 2023-07-19 DIAGNOSIS — Z87891 Personal history of nicotine dependence: Secondary | ICD-10-CM | POA: Diagnosis not present

## 2023-07-19 DIAGNOSIS — Z923 Personal history of irradiation: Secondary | ICD-10-CM | POA: Diagnosis not present

## 2023-07-19 DIAGNOSIS — J189 Pneumonia, unspecified organism: Secondary | ICD-10-CM | POA: Diagnosis not present

## 2023-07-19 DIAGNOSIS — Z808 Family history of malignant neoplasm of other organs or systems: Secondary | ICD-10-CM | POA: Diagnosis not present

## 2023-07-19 DIAGNOSIS — M069 Rheumatoid arthritis, unspecified: Secondary | ICD-10-CM | POA: Diagnosis not present

## 2023-07-19 DIAGNOSIS — Z13820 Encounter for screening for osteoporosis: Secondary | ICD-10-CM

## 2023-07-19 DIAGNOSIS — Z8042 Family history of malignant neoplasm of prostate: Secondary | ICD-10-CM | POA: Diagnosis not present

## 2023-07-19 DIAGNOSIS — Z800 Family history of malignant neoplasm of digestive organs: Secondary | ICD-10-CM | POA: Diagnosis not present

## 2023-07-19 DIAGNOSIS — Z170 Estrogen receptor positive status [ER+]: Secondary | ICD-10-CM | POA: Diagnosis not present

## 2023-07-19 DIAGNOSIS — Z807 Family history of other malignant neoplasms of lymphoid, hematopoietic and related tissues: Secondary | ICD-10-CM | POA: Diagnosis not present

## 2023-07-19 DIAGNOSIS — Z79811 Long term (current) use of aromatase inhibitors: Secondary | ICD-10-CM

## 2023-07-19 DIAGNOSIS — C50312 Malignant neoplasm of lower-inner quadrant of left female breast: Secondary | ICD-10-CM

## 2023-07-19 DIAGNOSIS — Z79899 Other long term (current) drug therapy: Secondary | ICD-10-CM | POA: Diagnosis not present

## 2023-07-19 DIAGNOSIS — R531 Weakness: Secondary | ICD-10-CM | POA: Diagnosis not present

## 2023-07-19 NOTE — Progress Notes (Unsigned)
SURVIVORSHIP VISIT:  BRIEF ONCOLOGIC HISTORY:  Oncology History  Malignant neoplasm of lower-inner quadrant of left breast in female, estrogen receptor positive (HCC)  11/08/2022 Surgery   Screening mammogram detected left breast masses at 8 o'clock position: 1.1 cm: Grade 1 IDC ER 95%, PR 80%, HER2 negative 0.7 cm mass: Grade 1 IDC ER 100%, PR 90%, HER2 negative, Ki-67 10%, multiple lymph nodes 1 was biopsied and benign discordant   11/15/2022 Initial Diagnosis   Primary malignant neoplasm of lower outer quadrant of female breast, left (HCC)   11/15/2022 Cancer Staging   Staging form: Breast, AJCC 8th Edition - Clinical stage from 11/15/2022: Stage IA (cT1c, cN0(f), cM0, G1, ER+, PR+, HER2-) - Signed by Ronny Bacon, PA-C on 11/15/2022 Stage prefix: Initial diagnosis Method of lymph node assessment: Core biopsy Histologic grading system: 3 grade system    Genetic Testing   [facility] Custom Cancer Panel+RNA was Negative. Report date is 11/29/2022.  The Custom Hereditary Cancers Panel offered by [facility] includes sequencing and/or deletion duplication testing of the following 43 genes: APC, ATM, AXIN2, BAP1, BARD1, BMPR1A, BRCA1, BRCA2, BRIP1, CDH1, CDK4, CDKN2A (p14ARF and p16INK4a only), CHEK2, CTNNA1, EPCAM (Deletion/duplication testing only), FH, GREM1 (promoter region duplication testing only), HOXB13, KIT, MBD4, MEN1, MLH1, MSH2, MSH3, MSH6, MUTYH, NF1, NHTL1, PALB2, PDGFRA, PMS2, POLD1, POLE, PTEN, RAD51C, RAD51D, SMAD4, SMARCA4. STK11, TP53, TSC1, TSC2, and VHL.   12/21/2022 Surgery   Left lumpectomy: 2 foci IDC 1 with lobular features 1 cm and 0.9 cm grade 1, grade 2 DCIS, LCIS, margins negative, LVI present, ER 95%, PR 80%, HER2 2+ by IHC, FISH negative, Ki-67 10% 1/4 lymph nodes positive, another lymph node with micrometastasis   12/21/2022 Oncotype testing   18/16%   01/04/2023 Cancer Staging   Staging form: Breast, AJCC 8th Edition - Pathologic: Stage IA (pT1b, pN1(sn), cM0,  G2, ER+, PR+, HER2-) - Signed by Serena Croissant, MD on 01/04/2023 Method of lymph node assessment: Sentinel lymph node biopsy Histologic grading system: 3 grade system   02/17/2023 - 04/06/2023 Radiation Therapy   Plan Name: Breast_L_BH Site: Breast, Left Technique: 3D Mode: Photon Dose Per Fraction: 1.8 Gy Prescribed Dose (Delivered / Prescribed): 18 Gy / 18 Gy Prescribed Fxs (Delivered / Prescribed): 10 / 10   Plan Name: Breast_L_BH:1 Site: Breast, Left Technique: 3D Mode: Photon Dose Per Fraction: 1.8 Gy Prescribed Dose (Delivered / Prescribed): 32.4 Gy / 32.4 Gy Prescribed Fxs (Delivered / Prescribed): 18 / 18   Plan Name: Brst_L_Scv_BH Site: Sclav-LT Technique: 3D Mode: Photon Dose Per Fraction: 1.8 Gy Prescribed Dose (Delivered / Prescribed): 50.4 Gy / 50.4 Gy Prescribed Fxs (Delivered / Prescribed): 28 / 28   Plan Name: Brst_L_Bst_BH Site: Breast, Left Technique: 3D Mode: Photon Dose Per Fraction: 2 Gy Prescribed Dose (Delivered / Prescribed): 10 Gy / 10 Gy Prescribed Fxs (Delivered / Prescribed): 5 / 5     03/2023 -  Anti-estrogen oral therapy   Anastrozole x 7 years     INTERVAL HISTORY:   Discussed the use of AI scribe software for clinical note transcription with the patient, who gave verbal consent to proceed.  Pamela Davenport to review her survivorship care plan detailing her treatment course for breast cancer, as well as monitoring long-term side effects of that treatment, education regarding health maintenance, screening, and overall wellness and health promotion.     Overall, Pamela Davenport rrecently retired, presented with a complex medical history including a recent diagnosis of breast cancer, for which they underwent lumpectomy and  radiation therapy, and a history of rheumatoid arthritis. They reported a series of health issues beginning with fatigue prior to a COVID-19 diagnosis in August, followed by a flu diagnosis, for which they had not received a  vaccination. The patient noted that their symptoms were not typical of flu, primarily experiencing fatigue rather than upper respiratory symptoms or nausea.  The patient's health continued to decline with increasing joint pain, particularly in the hands, and difficulty with mobility to the point of needing assistance to get into bed. They reported that their arthritis flares were not localized but seemed to move from one joint to another. Over-the-counter pain medications were ineffective, and the patient admitted to taking ibuprofen despite being advised against NSAIDs due to elevated acne levels.  The patient was also on anastrozole, which they suspected was exacerbating their rheumatoid arthritis symptoms. They reported that their condition had deteriorated to the point where they could not tolerate touch and had difficulty moving their arms. They sought emergency care and were diagnosed with pneumonia, which they attributed to the recent bout of flu.  The patient reported a persistent low-grade fever even after the flu symptoms had subsided. They also noted that they had stopped taking anastrozole five days prior to the consultation. Prior to these health issues, the patient had been active, walking five laps around a botanical garden daily. However, their condition had deteriorated to the point where they could no longer maintain this level of activity.  She has stopped anastrozole.  REVIEW OF SYSTEMS:  Review of Systems  Constitutional:  Positive for fatigue. Negative for appetite change, chills, fever and unexpected weight change.  HENT:   Negative for hearing loss, lump/mass and trouble swallowing.   Eyes:  Negative for eye problems and icterus.  Respiratory:  Negative for chest tightness, cough and shortness of breath.   Cardiovascular:  Negative for chest pain, leg swelling and palpitations.  Gastrointestinal:  Negative for abdominal distention, abdominal pain, constipation, diarrhea, nausea  and vomiting.  Endocrine: Negative for hot flashes.  Genitourinary:  Negative for difficulty urinating.   Musculoskeletal:  Positive for arthralgias.  Skin:  Negative for itching and rash.  Neurological:  Negative for dizziness, extremity weakness, headaches and numbness.  Hematological:  Negative for adenopathy. Does not bruise/bleed easily.  Psychiatric/Behavioral:  Negative for depression. The patient is not nervous/anxious.    Breast: Denies any new nodularity, masses, tenderness, nipple changes, or nipple discharge.       PAST MEDICAL/SURGICAL HISTORY:  Past Medical History:  Diagnosis Date   Atypical chest pain    normal myoview 11/11/10   Blood transfusion without reported diagnosis    Cancer (HCC)    DVT (deep venous thrombosis) (HCC)    recurrent; chronically anticoagulated with coumadin   Dysrhythmia    Family history of anesthesia complication    Sister had decrease in respirations after surgery   Heart murmur    Herniated lumbar intervertebral disc    Hypertension    Paroxysmal atrial fibrillation (HCC)    SINCE AGE 73    PE (pulmonary embolism)    chronically anticoagulated with coumadin   Pneumonia 07/20/2012   PONV (postoperative nausea and vomiting)    Rheumatoid arthritis (HCC)    Sickle cell trait (HCC)    Sinus congestion 08/14/2018   C/O NASAL/CHEST  CONGESTION, COUGHING WITH PHLEGM, PER PATIENT , SHE IS TO BEGIN TAKING AUGMENTIN DOSE PACK TODAY;     Strep throat at age 61   The patient reports a severe  strep throat infection at age 8 and   is not clear as to whether or not she may have had rheumatic fever.   Symptomatic premature ventricular contractions    improved s/p ablation   Urgency of urination    Uterine bleeding 2008   Uterine artery embolization in 2008 for uterine bleeding.    Past Surgical History:  Procedure Laterality Date   BREAST BIOPSY Left 11/08/2022   [place] LT BREAST BX W LOC DEV EA ADD LESION IMG BX SPEC US GUIDE 11/08/2022 [facility]  MAMMOGRAPHY   BREAST BIOPSY Left 11/08/2022   [place] LT BREAST BX W LOC DEV 1ST LESION IMG BX SPEC US GUIDE 11/08/2022 [facility] MAMMOGRAPHY   BREAST BIOPSY Left 12/17/2022   [place] LT RADIOACTIVE SEED LOC 12/17/2022 [facility] MAMMOGRAPHY   BREAST BIOPSY Left 12/17/2022   [place] LT RADIOACTIVE SEED EA ADD LESION 12/17/2022 [facility] MAMMOGRAPHY   BREAST BIOPSY Left 12/17/2022   [place] LT RADIOACTIVE SEED EA ADD LESION 12/17/2022 [facility] MAMMOGRAPHY   BREAST LUMPECTOMY WITH RADIOACTIVE SEED AND SENTINEL LYMPH NODE BIOPSY Left 12/21/2022   Procedure: LEFT BREAST BRACKETED SEED LUMPECTOMY AND LEFT SENTINEL LYMPH NODE MAPPING;  Surgeon: Harriette Bouillon, MD;  Location: [facility];  Service: General;  Laterality: Left;  GEN & PEC BLOCK   COLONOSCOPY W/ POLYPECTOMY     Diagnostic D&C hysteroscopy and Novasure ablation  03/17/2004   DILATION AND CURETTAGE OF UTERUS  12/17/2011   Procedure: DILATATION AND CURETTAGE;  Surgeon: Kathreen Cosier, MD;  Location: [facility];  Service: Gynecology;  Laterality: N/A;  With Attempted Hydrothermal Ablation   LUMBAR LAMINECTOMY/DECOMPRESSION MICRODISCECTOMY Left 02/21/2013   Procedure: LUMBAR LAMINECTOMY/DECOMPRESSION MICRODISCECTOMY 1 LEVEL;  Surgeon: Carmela Hurt, MD;  Location: [facility];  Service: Neurosurgery;  Laterality: Left;  LEFT Lumbar Three-Four Laminotomy foraminotomy microdiskectomy   MANDIBLE SURGERY     under bite   PVC Ablation  2010   ROBOTIC ASSISTED TOTAL HYSTERECTOMY WITH BILATERAL SALPINGO OOPHERECTOMY Bilateral 08/21/2018   Procedure: XI ROBOTIC ASSISTED TOTAL HYSTERECTOMY WITH BILATERAL SALPINGO OOPHORECTOMY;  Surgeon: Adolphus Birchwood, MD;  Location: [facility];  Service: Gynecology;  Laterality: Bilateral;   SPINE SURGERY     TONSILLECTOMY     UTERINE ARTERY EMBOLIZATION  2008   for uterine bleeding   VASCULAR SURGERY     laser of left leg     ALLERGIES:  Allergies  Allergen Reactions   Methylprednisolone     REACTION: Atrial  fibrillation at end of medrol dose pack   Sulfa Antibiotics Hives   Sulfonamide Derivatives Hives   Macrobid [Nitrofurantoin]     Muscle tightening and cramps      CURRENT MEDICATIONS:  Outpatient Encounter Medications as of 07/19/2023  Medication Sig   amLODipine (NORVASC) 10 MG tablet Take 1 tablet (10 mg total) by mouth at bedtime.   amoxicillin-clavulanate (AUGMENTIN) 875-125 MG tablet Take 1 tablet by mouth every 12 (twelve) hours.   anastrozole (ARIMIDEX) 1 MG tablet Take 1 tablet (1 mg total) by mouth daily.   azithromycin (ZITHROMAX) 250 MG tablet Take 1 tablet (250 mg total) by mouth daily for 4 days.   flecainide (TAMBOCOR) 150 MG tablet Take 2 tablets by mouth at the onset of AFIB   HYDROcodone bit-homatropine (HYCODAN) 5-1.5 MG/5ML syrup Take 5 mLs by mouth every 6 (six) hours as needed for cough.   hydroxychloroquine (PLAQUENIL) 200 MG tablet Take 1 tablet (200 mg total) by mouth 2 (two) times daily.   irbesartan (AVAPRO) 300  MG tablet Take 1 tablet (300 mg total) by mouth daily.   nadolol (CORGARD) 20 MG tablet Take 1 tablet (20 mg total) by mouth daily.   nystatin-triamcinolone ointment (MYCOLOG) Apply to affected areas two times a day   predniSONE (DELTASONE) 5 MG tablet TAKE 4 TABS BY MOUTH DAILY FOR 4 DAYS, 3 TABS BY MOUTH DAILY FOR 4 DAYS, 2 TABS BY MOUTH DAILY FOR 4 DAYS THEN 1 TAB DAILY FOR 4 DAYS.   rivaroxaban (XARELTO) 20 MG TABS tablet Take 1 tablet (20 mg total) by mouth daily with supper.   solifenacin (VESICARE) 5 MG tablet Take 5 mg by mouth daily.   TART CHERRY PO Take 2 tablets by mouth daily.   thiamine (VITAMIN B-1) 50 MG tablet Take 1 tablet (50 mg total) by mouth daily.   ALPRAZolam (XANAX) 0.5 MG tablet Take 1 tablet (0.5 mg total) by mouth 2 (two) times daily. (Patient not taking: Reported on 07/19/2023)   esomeprazole (NEXIUM) 40 MG capsule Take 1 capsule (40 mg total) by mouth daily. (Patient not taking: Reported on 05/12/2023)   [DISCONTINUED]  hydrOXYzine (ATARAX) 25 MG tablet Take 1 tablet (25 mg total) by mouth 3 (three) times daily as needed.   [DISCONTINUED] oseltamivir (TAMIFLU) 75 MG capsule Take 1 capsule (75 mg total) by mouth every 12 (twelve) hours.   No facility-administered encounter medications on file as of 07/19/2023.     ONCOLOGIC FAMILY HISTORY:  Family History  Problem Relation Age of Onset   Hypertension Mother    Diabetes Mother    Uterine cancer Sister 40   Prostate cancer Brother 38   Prostate cancer Brother 20   Prostate cancer Brother    Prostate cancer Maternal Uncle    Prostate cancer Maternal Uncle    Non-Hodgkin's lymphoma Paternal Aunt 21   Pancreatic cancer Paternal Aunt 66   Colon cancer Paternal Grandmother 61   Healthy Son    Breast cancer Neg Hx      SOCIAL HISTORY:  Social History   Socioeconomic History   Marital status: Single    Spouse name: Not on file   Number of children: Not on file   Years of education: Not on file   Highest education level: Not on file  Occupational History   Occupation: Nurse  Tobacco Use   Smoking status: Former    Current packs/day: 0.00    Average packs/day: 1 pack/day for 2.0 years (2.0 ttl pk-yrs)    Types: Cigarettes    Start date: 02/12/1977    Quit date: 02/13/1979    Years since quitting: 44.4    Passive exposure: Never   Smokeless tobacco: Never  Vaping Use   Vaping status: Never Used  Substance and Sexual Activity   Alcohol use: Yes    Comment: rarely   Drug use: No   Sexual activity: Not on file  Other Topics Concern   Not on file  Social History Narrative   Works as a Engineer, civil (consulting) at [employer]   Social Determinants of Health   Financial Resource Strain: Not on file  Food Insecurity: No Food Insecurity (01/19/2023)   Hunger Vital Sign    Worried About Running Out of Food in the Last Year: Never true    Ran Out of Food in the Last Year: Never true  Transportation Needs: No Transportation Needs (01/19/2023)   PRAPARE -  Administrator, Civil Service (Medical): No    Lack of Transportation (Non-Medical): No  Physical Activity:  Not on file  Stress: Not on file  Social Connections: Not on file  Intimate Partner Violence: Not At Risk (01/19/2023)   Humiliation, Afraid, Rape, and Kick questionnaire    Fear of Current or Ex-Partner: No    Emotionally Abused: No    Physically Abused: No    Sexually Abused: No     OBSERVATIONS/OBJECTIVE:  BP 133/80 (BP Location: Right Arm, Patient Position: Sitting)   Pulse 87   Temp 98 F (36.7 C) (Tympanic)   Resp 18   Ht 5\' 9"  (1.753 m)   Wt 264 lb 6.4 oz (119.9 kg)   SpO2 98%   BMI 39.05 kg/m  GENERAL: Patient is a well appearing female in no acute distress HEENT:  Sclerae anicteric.  Oropharynx clear and moist. No ulcerations or evidence of oropharyngeal candidiasis. Neck is supple.  NODES:  No cervical, supraclavicular, or axillary lymphadenopathy palpated.  BREAST EXAM:  left breast s/p lumpectomy and radiation, no sign of local recurrence, right breast benign LUNGS:  Clear to auscultation bilaterally.  No wheezes or rhonchi. HEART:  Regular rate and rhythm. No murmur appreciated. ABDOMEN:  Soft, nontender.  Positive, normoactive bowel sounds. No organomegaly palpated. MSK:  No focal spinal tenderness to palpation. Full range of motion bilaterally in the upper extremities. EXTREMITIES:  No peripheral edema.   SKIN:  Clear with no obvious rashes or skin changes. No nail dyscrasia. NEURO:  Nonfocal. Well oriented.  Appropriate affect.   LABORATORY DATA:  None for this visit.  DIAGNOSTIC IMAGING:  None for this visit.      ASSESSMENT AND PLAN:  Ms.. Davenport is a pleasant 67 y.o. female with Stage IA left breast invasive ductal carcinoma, ER+/PR+/HER2-, diagnosed in 11/2022, treated with lumpectomy, adjuvant radiation therapy, and anti-estrogen therapy with Anastrozole beginning in 05/2023.  She presents to the Survivorship Clinic for our initial  meeting and routine follow-up post-completion of treatment for breast cancer.    1. Stage IA left breast cancer:  Pamela Davenport is continuing to recover from definitive treatment for breast cancer. She will follow-up as noted below. Her mammogram is due 10/2023; orders placed today.   Today, a comprehensive survivorship care plan and treatment summary was reviewed with the patient today detailing her breast cancer diagnosis, treatment course, potential late/long-term effects of treatment, appropriate follow-up care with recommendations for the future, and patient education resources.  A copy of this summary, along with a letter will be sent to the patient's primary care provider via mail/fax/In Basket message after today's visit.    2. Pneumonia and generalized weakness: Recommended she stay off Anastrozole for the next 4 weeks. We will repeat chest xray in 4 weeks and CT chest in 8-12 weeks.    3. Bone health:  Given Ms. Pelot's age/history of breast cancer and her current treatment regimen including anti-estrogen therapy with Anastrozole, she is at risk for bone demineralization. Bone density testing ordered. She was given education on specific activities to promote bone health.  4. Cancer screening:  Due to Ms. Shorey's history and her age, she should receive screening for skin cancers, colon cancer, and gynecologic cancers.  The information and recommendations are listed on the patient's comprehensive care plan/treatment summary and were reviewed in detail with the patient.    5. Health maintenance and wellness promotion: Ms. Mcclammy was encouraged to consume 5-7 servings of fruits and vegetables per day. We reviewed the "Nutrition Rainbow" handout.  She was also encouraged to engage in moderate to vigorous exercise for  30 minutes per day most days of the week.  She was instructed to limit her alcohol consumption and continue to abstain from tobacco use.     6. Support services/counseling: It is not  uncommon for this period of the patient's cancer care trajectory to be one of many emotions and stressors.   She was given information regarding our available services and encouraged to contact me with any questions or for help enrolling in any of our support group/programs.    Follow up instructions:    -Return to cancer center in 4 weeks for f/u  -Mammogram due in 11/2022 -Bone density testing ordreed -She is welcome to return back to the Survivorship Clinic at any time; no additional follow-up needed at this time.  -Consider referral back to survivorship as a long-term survivor for continued surveillance  The patient was provided an opportunity to ask questions and all were answered. The patient agreed with the plan and demonstrated an understanding of the instructions.   Total encounter time:55 minutes*in face-to-face visit time, chart review, lab review, care coordination, order entry, and documentation of the encounter time.    Lillard Anes, NP 07/19/23 2:46 PM Medical Oncology and Hematology [facility] [address] [place], [place] [address] Tel. [phone]    Fax. [phone]  *Total Encounter Time as defined by the Centers for Medicare and Medicaid Services includes, in addition to the face-to-face time of a patient visit (documented in the note above) non-face-to-face time: obtaining and reviewing outside history, ordering and reviewing medications, tests or procedures, care coordination (communications with other health care professionals or caregivers) and documentation in the medical record.

## 2023-07-22 NOTE — Progress Notes (Unsigned)
  Electrophysiology Office Note:   Date:  07/25/2023  ID:  Pamela Davenport, DOB 14-Nov-1955, MRN [id number]  Primary Cardiologist: None Electrophysiologist: Lanier Prude, MD      History of Present Illness:   Pamela Davenport is a 67 y.o. female with h/o PAF, PVCs, HTN, and Breast CA seen today for routine electrophysiology followup.   Since last being seen in our clinic the patient reports doing ok from a cardiac perspective. She has remained in NSR despite COVID -> Flu -> PNA.  She also worries she had a reaction to anastrozole and is off that for at least 4 weeks, while they determine next best steps. Finished radiation for Breast CA in July. Otherwise,  she denies chest pain, palpitations, dyspnea, PND, orthopnea, nausea, vomiting, dizziness, syncope, edema, weight gain, or early satiety.   Review of systems complete and found to be negative unless listed in HPI.   EP Information / Studies Reviewed:    EKG is ordered today. Personal review as below.  EKG Interpretation Date/Time:  Monday July 25 2023 11:04:47 EST Ventricular Rate:  66 PR Interval:  174 QRS Duration:  96 QT Interval:  408 QTC Calculation: 427 R Axis:   64  Text Interpretation: Normal sinus rhythm Incomplete right bundle branch block Nonspecific T wave abnormality When compared with ECG of 17-Jul-2023 10:47, PREVIOUS ECG IS PRESENT Confirmed by Maxine Glenn [phone]) on 07/25/2023 11:12:47 AM    Echo 07/2021 LVEF 55-60%, mild MR  Myoview 07/2021 Normal, low risk study without evidence of ischemia or infarction.  Physical Exam:   VS:  BP 110/74 (BP Location: Left Arm, Patient Position: Sitting, Cuff Size: Normal)   Pulse 66   Ht 5\' 9"  (1.753 m)   Wt 259 lb 9.6 oz (117.8 kg)   SpO2 93%   BMI 38.34 kg/m    Wt Readings from Last 3 Encounters:  07/25/23 259 lb 9.6 oz (117.8 kg)  07/19/23 264 lb 6.4 oz (119.9 kg)  07/17/23 269 lb (122 kg)     GEN: Well nourished, well developed in no acute  distress NECK: No JVD; No carotid bruits CARDIAC: Regular rate and rhythm, no murmurs, rubs, gallops RESPIRATORY:  Clear to auscultation without rales, wheezing or rhonchi  ABDOMEN: Soft, non-tender, non-distended EXTREMITIES:  No edema; No deformity   ASSESSMENT AND PLAN:    Paroxysmal atrial fibrillation High risk medication monitoring - Flecainide EKG today shows NSR with stable intervals Continue Xarelto for CHA2DS2/VASc of at least 5. PR and QRS duration remain acceptable for continued flecainide use as needed. Continue nadolol  PVCs Continue nadolol  HTN Stable on current regimen    Follow up with EP APP or Dr. Lalla Brothers in 6 months, sooner with issues.   Signed, Graciella Freer, PA-C

## 2023-07-25 ENCOUNTER — Ambulatory Visit: Payer: Medicare HMO | Attending: Student | Admitting: Student

## 2023-07-25 ENCOUNTER — Encounter: Payer: Self-pay | Admitting: Student

## 2023-07-25 VITALS — BP 110/74 | HR 66 | Ht 69.0 in | Wt 259.6 lb

## 2023-07-25 DIAGNOSIS — I480 Paroxysmal atrial fibrillation: Secondary | ICD-10-CM

## 2023-07-25 DIAGNOSIS — I10 Essential (primary) hypertension: Secondary | ICD-10-CM | POA: Diagnosis not present

## 2023-07-25 MED ORDER — NADOLOL 20 MG PO TABS: 20.0000 mg | ORAL_TABLET | Freq: Every day | ORAL | 3 refills | Status: DC

## 2023-07-25 NOTE — Patient Instructions (Signed)
Medication Instructions:  Your physician recommends that you continue on your current medications as directed. Please refer to the Current Medication list given to you today.  *If you need a refill on your cardiac medications before your next appointment, please call your pharmacy*  Lab Work: None ordered If you have labs (blood work) drawn today and your tests are completely normal, you will receive your results only by: MyChart Message (if you have MyChart) OR A paper copy in the mail If you have any lab test that is abnormal or we need to change your treatment, we will call you to review the results  Follow-Up: At [facility], you and your health needs are our priority.  As part of our continuing mission to provide you with exceptional heart care, we have created designated Provider Care Teams.  These Care Teams include your primary Cardiologist (physician) and Advanced Practice Providers (APPs -  Physician Assistants and Nurse Practitioners) who all work together to provide you with the care you need, when you need it.  Your next appointment:   6 month(s)  Provider:   Michael "Andy" Tillery, PA-C  

## 2023-08-01 NOTE — Progress Notes (Unsigned)
Office Visit Note  Patient: Pamela Davenport             Date of Birth: [birth year]           MRN: [id number]             PCP: [name], MD Referring: [name], MD Visit Date: 08/02/2023 Occupation: @GUAROCC @  Subjective:  Pain and swelling in multiple joints  History of Present Illness: Pamela Davenport is a 67 y.o. female with seropositive rheumatoid arthritis.  She returns today after her last visit on May 12, 2023.  Patient states she developed COVID-19 virus infection in August.  She had flu in November 2024.  And she developed pneumonia on November 17,2024.  She was treated with Augmentin.  She states the symptoms gradually resolved. She finished radiation therapy March 06, 2023.  She has been on anastrozole since July.  Mammogram is scheduled for February 2025. She has been experiencing increased pain and swelling in her both hands since November 2024.  She states her neck and all of her joints are painful currently.  She states she was given a prednisone taper on July 17, 2023 for pneumonia which improved her joint pain tremendously.  Now the pain is coming back.  She continues to take hydroxychloroquine 200 mg twice a day without any interruption.  She also stopped anastrozole as she felt it was causing joint pain.  She denies taking NSAIDs now.  She states she is taking NSAIDs in the past but did stop them in September because of elevated creatinine.  Activities of Daily Living:  Patient reports morning stiffness for all day. Patient Reports nocturnal pain.  Difficulty dressing/grooming: Reports Difficulty climbing stairs: Reports Difficulty getting out of chair: Reports Difficulty using hands for taps, buttons, cutlery, and/or writing: Reports  Review of Systems  Constitutional:  Positive for fatigue.  HENT:  Positive for mouth dryness. Negative for mouth sores.   Eyes:  Positive for dryness.  Respiratory:  Negative for cough, shortness of breath and  wheezing.   Cardiovascular:  Positive for palpitations. Negative for chest pain.  Gastrointestinal:  Positive for constipation. Negative for blood in stool and diarrhea.  Endocrine: Negative for increased urination.  Genitourinary:  Negative for involuntary urination.  Musculoskeletal:  Positive for joint pain, gait problem, joint pain, joint swelling, myalgias, muscle weakness, morning stiffness, muscle tenderness and myalgias.  Skin:  Negative for color change, rash, hair loss and sensitivity to sunlight.  Allergic/Immunologic: Positive for susceptible to infections.  Neurological:  Positive for headaches and weakness. Negative for dizziness.  Hematological:  Negative for swollen glands.  Psychiatric/Behavioral:  Negative for depressed mood and sleep disturbance. The patient is not nervous/anxious.     PMFS History:  Patient Active Problem List   Diagnosis Date Noted   Genetic testing 11/30/2022   Family history of prostate cancer 11/19/2022   Malignant neoplasm of lower-inner quadrant of left breast in female, estrogen receptor positive (HCC) 11/15/2022   Anemia due to acquired thiamine deficiency 05/07/2022   Deficiency anemia 05/04/2022   Gastroesophageal reflux disease with esophagitis without hemorrhage 05/04/2022   Esophageal dysphagia 05/04/2022   Intermittent palpitations 05/04/2022   OAB (overactive bladder) 12/31/2021   Encounter for general adult medical examination with abnormal findings 08/26/2021   Obesity, morbid, BMI 40.0-49.9 (HCC) 08/26/2021   PAF (paroxysmal atrial fibrillation) (HCC) 08/26/2021   GERD without esophagitis 10/04/2019   Vitamin D deficiency disease 09/25/2019   Hypercoagulable state (HCC) 09/24/2019  Iron deficiency anemia 12/07/2018   Other dietary vitamin B12 deficiency anemia 12/07/2018   Visit for screening mammogram 04/18/2017   Hyperlipidemia with target LDL less than 130 10/19/2016   Essential hypertension 01/13/2015   Chronic pulmonary  embolism (HCC) 12/05/2008    Past Medical History:  Diagnosis Date   Atypical chest pain    normal myoview 11/11/10   Blood transfusion without reported diagnosis    Cancer (HCC)    DVT (deep venous thrombosis) (HCC)    recurrent; chronically anticoagulated with coumadin   Dysrhythmia    Family history of anesthesia complication    Sister had decrease in respirations after surgery   Heart murmur    Herniated lumbar intervertebral disc    Hypertension    Paroxysmal atrial fibrillation (HCC)    SINCE AGE 42    PE (pulmonary embolism)    chronically anticoagulated with coumadin   Pneumonia 07/20/2012   PONV (postoperative nausea and vomiting)    Rheumatoid arthritis (HCC)    Sickle cell trait (HCC)    Sinus congestion 08/14/2018   C/O NASAL/CHEST  CONGESTION, COUGHING WITH PHLEGM, PER PATIENT , SHE IS TO BEGIN TAKING AUGMENTIN DOSE PACK TODAY;     Strep throat at age 73   The patient reports a severe strep throat infection at age 12 and   is not clear as to whether or not she may have had rheumatic fever.   Symptomatic premature ventricular contractions    improved s/p ablation   Urgency of urination    Uterine bleeding 2008   Uterine artery embolization in 2008 for uterine bleeding.     Family History  Problem Relation Age of Onset   Hypertension Mother    Diabetes Mother    Uterine cancer Sister 91   Prostate cancer Brother 54   Prostate cancer Brother 70   Prostate cancer Brother    Prostate cancer Maternal Uncle    Prostate cancer Maternal Uncle    Non-Hodgkin's lymphoma Paternal Aunt 25   Pancreatic cancer Paternal Aunt 33   Colon cancer Paternal Grandmother 5   Healthy Son    Breast cancer Neg Hx    Past Surgical History:  Procedure Laterality Date   BREAST BIOPSY Left 11/08/2022   [place] LT BREAST BX W LOC DEV EA ADD LESION IMG BX SPEC US GUIDE 11/08/2022 [facility]   BREAST BIOPSY Left 11/08/2022   [place] LT BREAST BX W LOC DEV 1ST LESION IMG BX SPEC US GUIDE  11/08/2022 [facility]   BREAST BIOPSY Left 12/17/2022   [place] LT RADIOACTIVE SEED LOC 12/17/2022 [facility]   BREAST BIOPSY Left 12/17/2022   [place] LT RADIOACTIVE SEED EA ADD LESION 12/17/2022 [facility]   BREAST BIOPSY Left 12/17/2022   [place] LT RADIOACTIVE SEED EA ADD LESION 12/17/2022 [facility]   BREAST LUMPECTOMY WITH RADIOACTIVE SEED AND SENTINEL LYMPH NODE BIOPSY Left 12/21/2022   Procedure: LEFT BREAST BRACKETED SEED LUMPECTOMY AND LEFT SENTINEL LYMPH NODE MAPPING;  Surgeon: Harriette Bouillon, MD;  Location: [facility];  Service: General;  Laterality: Left;  GEN & PEC BLOCK   COLONOSCOPY W/ POLYPECTOMY     Diagnostic D&C hysteroscopy and Novasure ablation  03/17/2004   DILATION AND CURETTAGE OF UTERUS  12/17/2011   Procedure: DILATATION AND CURETTAGE;  Surgeon: Kathreen Cosier, MD;  Location: [facility];  Service: Gynecology;  Laterality: N/A;  With Attempted Hydrothermal Ablation   LUMBAR LAMINECTOMY/DECOMPRESSION MICRODISCECTOMY Left 02/21/2013   Procedure: LUMBAR LAMINECTOMY/DECOMPRESSION MICRODISCECTOMY 1 LEVEL;  Surgeon: Carmela Hurt, MD;  Location: [facility];  Service: Neurosurgery;  Laterality: Left;  LEFT Lumbar Three-Four Laminotomy foraminotomy microdiskectomy   MANDIBLE SURGERY     under bite   PVC Ablation  2010   ROBOTIC ASSISTED TOTAL HYSTERECTOMY WITH BILATERAL SALPINGO OOPHERECTOMY Bilateral 08/21/2018   Procedure: XI ROBOTIC ASSISTED TOTAL HYSTERECTOMY WITH BILATERAL SALPINGO OOPHORECTOMY;  Surgeon: Adolphus Birchwood, MD;  Location: [facility];  Service: Gynecology;  Laterality: Bilateral;   SPINE SURGERY     TONSILLECTOMY     UTERINE ARTERY EMBOLIZATION  2008   for uterine bleeding   VASCULAR SURGERY     laser of left leg   Social History   Social History Narrative   Works as a Engineer, civil (consulting) at [employer] History  Administered Date(s) Administered   Influenza,inj,Quad PF,6+ Mos 06/14/2019    Influenza-Unspecified 05/05/2021   PFIZER(Purple Top)SARS-COV-2 Vaccination 11/08/2019, 11/30/2019, 08/19/2020   PNEUMOCOCCAL CONJUGATE-20 08/26/2021   Tdap 02/03/2015   Zoster Recombinant(Shingrix) 08/26/2021     Objective: Vital Signs: BP 123/84 (BP Location: Right Arm, Patient Position: Sitting, Cuff Size: Large)   Pulse 77   Resp 17   Ht 5\' 9"  (1.753 m)   Wt 259 lb (117.5 kg)   BMI 38.25 kg/m    Physical Exam Vitals and nursing note reviewed.  Constitutional:      Appearance: She is well-developed.  HENT:     Head: Normocephalic and atraumatic.  Eyes:     Conjunctiva/sclera: Conjunctivae normal.  Cardiovascular:     Rate and Rhythm: Normal rate and regular rhythm.     Heart sounds: Normal heart sounds.  Pulmonary:     Effort: Pulmonary effort is normal.     Breath sounds: Normal breath sounds.  Abdominal:     General: Bowel sounds are normal.     Palpations: Abdomen is soft.  Musculoskeletal:     Cervical back: Normal range of motion.  Lymphadenopathy:     Cervical: No cervical adenopathy.  Skin:    General: Skin is warm and dry.     Capillary Refill: Capillary refill takes less than 2 seconds.  Neurological:     Mental Status: She is alert and oriented to person, place, and time.  Psychiatric:        Behavior: Behavior normal.      Musculoskeletal Exam: Patient had painful range of motion of the cervical spine.  She had no tenderness over thoracic or lumbar spine.  She had painful range of motion of bilateral shoulders.  Elbow joints and wrist joints with good range of motion.  She discomfort over bilateral wrist joints.  She has synovitis over several of her MCPs and PIPs as described above.  Hip joints in good range of motion.  She discomfort range of motion of her knee joints.  There was no tenderness over ankles or MTPs.  CDAI Exam: CDAI Score: 36  Patient Global: 70 / 100; Provider Global: 70 / 100 Swollen: 8 ; Tender: 15  Joint Exam 08/02/2023       Right  Left  Glenohumeral   Tender   Tender  Wrist   Tender   Tender  MCP 2  Swollen Tender     MCP 3  Swollen Tender     PIP 2 (finger)  Swollen Tender  Swollen Tender  PIP 3 (finger)  Swollen Tender  Swollen Tender  PIP 4 (finger)  Swollen Tender  Swollen Tender  Cervical Spine   Tender  Knee   Tender   Tender     Investigation: No additional findings.  Imaging: CT Angio Chest PE W and/or Wo Contrast  Result Date: 07/17/2023 CLINICAL DATA:  Clinical concern for pulmonary embolism. Increasing weakness and rib cage pain. EXAM: CT ANGIOGRAPHY CHEST WITH CONTRAST TECHNIQUE: Multidetector CT imaging of the chest was performed using the standard protocol during bolus administration of intravenous contrast. Multiplanar CT image reconstructions and MIPs were obtained to evaluate the vascular anatomy. RADIATION DOSE REDUCTION: This exam was performed according to the departmental dose-optimization program which includes automated exposure control, adjustment of the mA and/or kV according to patient size and/or use of iterative reconstruction technique. CONTRAST:  75mL OMNIPAQUE IOHEXOL 350 MG/ML SOLN COMPARISON:  July 20, 2012 FINDINGS: Cardiovascular: Satisfactory opacification of the pulmonary arteries to the segmental level. No evidence of pulmonary embolism. Normal heart size. No pericardial effusion. Mediastinum/Nodes: Bilateral mediastinal and left hilar enlarged lymph nodes with index lymph node in the AP window measuring 1.5 cm in short axis. Rounded borderline enlarged lymph nodes in the right axilla. Rounded thickened lymph nodes along the left bronchus cause mass effect to the lobar bronchi. No complete bronchial occlusion is seen. Lungs/Pleura: Dense peripheral airspace consolidation in the left upper lobe and lingula. Upper Abdomen: No acute abnormality. Musculoskeletal: Postsurgical changes in the left breast. Diffuse left breast swelling. Review of the MIP images confirms the above  findings. IMPRESSION: 1. No evidence of pulmonary embolism. 2. Dense peripheral airspace consolidation in the left upper lobe and lingula. This may represent pneumonia, infectious or postobstructive, as the left hilar lymphadenopathy causes mass effect to the lobar bronchial branches. Please follow-up after empiric treatment. If residual lymphadenopathy and consolidation, consider tissue sampling. 3. Bilateral mediastinal and left hilar enlarged lymph nodes, malignant versus reactive. 4. Rounded borderline enlarged lymph nodes in the right axilla, indeterminate. 5. Postsurgical changes in the left breast. Diffuse left breast swelling. Please correlate with possible post radiation state. Electronically Signed   By: Ted Mcalpine M.D.   On: 07/17/2023 16:18   DG Chest Portable 1 View  Result Date: 07/17/2023 CLINICAL DATA:  Weakness.  Chest pain. EXAM: PORTABLE CHEST 1 VIEW COMPARISON:  Two-view chest x-ray 10/01/2021 FINDINGS: The heart is enlarged. Diffuse airspace opacity is present over the peripheral left upper lobe along the chest wall. No osseous erosions are present. Atherosclerotic calcifications are present at the aortic arch. The right lung is clear. IMPRESSION: Diffuse airspace opacity over the peripheral left upper lobe along the chest wall. This is concerning for pneumonia or possibly tumor in this patient with breast cancer. Recommend CT of the chest with contrast for further evaluation. These results were called by telephone at the time of interpretation on 07/17/2023 at 12:54 pm to provider SHERI UPSTILL, who verbally acknowledged these results. Electronically Signed   By: Marin Roberts M.D.   On: 07/17/2023 12:57    Recent Labs: Lab Results  Component Value Date   WBC 9.0 07/17/2023   HGB 10.2 (L) 07/17/2023   PLT 465 (H) 07/17/2023   NA 135 07/17/2023   K 5.0 07/17/2023   CL 105 07/17/2023   CO2 21 (L) 07/17/2023   GLUCOSE 105 (H) 07/17/2023   BUN 18 07/17/2023    CREATININE 1.32 (H) 07/17/2023   BILITOT 0.6 07/17/2023   ALKPHOS 79 07/17/2023   AST 43 (H) 07/17/2023   ALT 45 (H) 07/17/2023   PROT 7.9 07/17/2023   ALBUMIN 3.0 (L) 07/17/2023   CALCIUM 9.2 07/17/2023  GFRAA 72 11/13/2020   QFTBGOLDPLUS NEGATIVE 01/15/2019    Speciality Comments: PLQ Eye Exam: 01/03/2023 WNL @ [facility]  Follow up in 1 year. Hydroxychloroquine since May 2020  Procedures:  No procedures performed Allergies: Methylprednisolone, Sulfa antibiotics, Sulfonamide derivatives, and Macrobid [nitrofurantoin]   Assessment / Plan:     Visit Diagnoses: Rheumatoid arthritis with rheumatoid factor of multiple sites without organ or systems involvement (HCC) - RF 74, CCP >250 ,Sed rate 45, ANA 1: 320 cytoplasmic, positive synovitis: Patient states she started having a flare of her arthritis in the beginning of November.  She has been having pain and swelling in multiple joints.  She states on July 17, 2023 she was diagnosed with pneumonia at the time she was given antibiotics and steroids.  Which helped her joint symptoms.  She is still on prednisone 5 mg p.o. daily which has been helpful.  Despite of taking prednisone she has significant swelling in multiple joints.  She complains of pain and discomfort in all of her joints.  She states the symptoms started after she started taking anastrozole.  She has been off anastrozole now.  Synovitis was noted in multiple joints as described above.  Patient has been having recurrent infections.  She had COVID-19 virus infection followed by flu and then pneumonia.  Increased risk of infection with immunosuppression was discussed.  I will be hesitant to put her on a biologic agent at this point.  I discussed possible use of leflunomide if her liver functions come back to normal.  Indications side effects contraindications were discussed at length.  A handout was given and consent was taken.  Her renal functions have been elevated.  Patient  relates it to NSAID use in the past.  I will recheck her CMP today.  If her LFTs are back to normal then we can initiate leflunomide at a lower dose at 10 mg p.o. daily and increase it gradually to 20 mg p.o. daily if the labs are normal at 2 weeks x 2.  Patient states that she has not been taking NSAIDs now but has been taking Tylenol which could be contributing to elevated liver functions.  I will give her a prednisone taper as a bridging therapy starting at 10 mg p.o. daily for 1 week, 7.5 mg p.o. daily for 1 week then 5 mg p.o. daily for 2 weeks then 2.5 mg p.o. daily for 2 weeks.  Medication counseling:   Baseline Immunosuppressant Therapy Labs     Latest Ref Rng & Units 01/15/2019    9:24 AM  Quantiferon TB Gold  Quantiferon TB Gold Plus NEGATIVE NEGATIVE        Latest Ref Rng & Units 01/15/2019    9:24 AM  Hepatitis  Hep B Surface Ag NON-REACTI NON-REACTIVE   Hep B IgM NON-REACTI NON-REACTIVE     Lab Results  Component Value Date   HIV NON-REACTIVE 12/07/2018       Latest Ref Rng & Units 01/15/2019    9:24 AM  Immunoglobulin Electrophoresis  IgA  70 - 320 mg/dL 284   IgG 132 - 4,401 mg/dL 0,272   IgM 50 - 536 mg/dL 76        Latest Ref Rng & Units 07/17/2023   12:49 PM  Serum Protein Electrophoresis  Total Protein 6.5 - 8.1 g/dL 7.9     Patient was counseled on the purpose, proper use, and adverse effects of leflunomide including risk of infection, nausea/diarrhea/weight loss, increase in blood pressure, rash,  hair loss, tingling in the hands and feet, and signs and symptoms of interstitial lung disease.   Also counseled on Black Box warning of liver injury and importance of avoiding alcohol while on therapy. Discussed that there is the possibility of an increased risk of malignancy but it is not well understood if this increased risk is due to the medication or the disease state.  Counseled patient to avoid live vaccines. Recommend annual influenza, Pneumovax 23,  Prevnar 13, and Shingrix as indicated.   Discussed the importance of frequent monitoring of liver function and blood count.  Standing orders placed.  Patient is postmenopausal.  Provided patient with educational materials on leflunomide and answered all questions.  Patient consented to [place] use, and consent will be uploaded into the media tab.   Patient dose will be 10 mg p.o. daily for 2 weeks and if labs are normal we will increase it to 20 mg p.o. daily..  Prescription pending lab results and/or insurance approval.   High risk medication use - Plaquenil 200 mg 1 tablet by mouth twice daily. -Started in May 2020. PLQ Eye Exam normal on 01/03/23.  Elevated serum creatinine-her serum creatinine has been elevated since August 2023.  Patient related to NSAID use.  Will continue to monitor closely.  Elevated LFTs-LFTs were elevated AST 43 and ALT 45.  Patient related to Tylenol use.  Chronic pain of both shoulders-she had been range of motion of bilateral shoulders.  Trigger finger, left ring finger -she has intermittent triggering.  Trigger middle finger of right hand -patient reports intermittent triggering.  Trigger index finger of right hand -patient reports intermittent triggering.  Primary osteoarthritis of both hands-she has rheumatoid arthritis and osteoarthritis overlap.  She is synovitis in multiple joints today.  Trochanteric bursitis of both hips-she has mild intermittent symptoms.  Primary osteoarthritis of both knees-knee joint showing good range of motion with discomfort.  Primary osteoarthritis of both feet-she had noted Raynauds over ankles or MTPs.  Positive ANA (antinuclear antibody) - ENA, C3-C4, anticardiolipin, lupus anticoagulant, beta-2 GP 1-were all negative.  She has no other clinical features of autoimmune disease.  De Quervain's disease (radial styloid tenosynovitis) right -she continues to have mild symptoms.  Other medical problems are listed as  follows:  History of DVT (deep vein thrombosis)  History of pulmonary embolism  History of anemia  Hyperlipidemia with target LDL less than 130  PVC's (premature ventricular contractions)  Essential hypertension  History of atrial fibrillation  Vitamin D deficiency  COVID-19 virus infection - 03/2023  History of recent pneumonia - 07/17/23  Orders: Orders Placed This Encounter  Procedures   COMPLETE METABOLIC PANEL WITH GFR   No orders of the defined types were placed in this encounter.    Follow-Up Instructions: Return in about 5 months (around 12/31/2023) for Rheumatoid arthritis, Osteoarthritis.   Pollyann Savoy, MD  Note - This record has been created using Animal nutritionist.  Chart creation errors have been sought, but may not always  have been located. Such creation errors do not reflect on  the standard of medical care.

## 2023-08-02 ENCOUNTER — Encounter: Payer: Self-pay | Admitting: Rheumatology

## 2023-08-02 ENCOUNTER — Other Ambulatory Visit: Payer: Self-pay | Admitting: *Deleted

## 2023-08-02 ENCOUNTER — Ambulatory Visit: Payer: Medicare HMO | Attending: Rheumatology | Admitting: Rheumatology

## 2023-08-02 VITALS — BP 123/84 | HR 77 | Resp 17 | Ht 69.0 in | Wt 259.0 lb

## 2023-08-02 DIAGNOSIS — I10 Essential (primary) hypertension: Secondary | ICD-10-CM

## 2023-08-02 DIAGNOSIS — Z862 Personal history of diseases of the blood and blood-forming organs and certain disorders involving the immune mechanism: Secondary | ICD-10-CM

## 2023-08-02 DIAGNOSIS — E785 Hyperlipidemia, unspecified: Secondary | ICD-10-CM

## 2023-08-02 DIAGNOSIS — M7061 Trochanteric bursitis, right hip: Secondary | ICD-10-CM

## 2023-08-02 DIAGNOSIS — Z8679 Personal history of other diseases of the circulatory system: Secondary | ICD-10-CM

## 2023-08-02 DIAGNOSIS — M65342 Trigger finger, left ring finger: Secondary | ICD-10-CM | POA: Diagnosis not present

## 2023-08-02 DIAGNOSIS — M25512 Pain in left shoulder: Secondary | ICD-10-CM

## 2023-08-02 DIAGNOSIS — Z86711 Personal history of pulmonary embolism: Secondary | ICD-10-CM

## 2023-08-02 DIAGNOSIS — M19041 Primary osteoarthritis, right hand: Secondary | ICD-10-CM | POA: Diagnosis not present

## 2023-08-02 DIAGNOSIS — M65321 Trigger finger, right index finger: Secondary | ICD-10-CM

## 2023-08-02 DIAGNOSIS — I493 Ventricular premature depolarization: Secondary | ICD-10-CM

## 2023-08-02 DIAGNOSIS — R7989 Other specified abnormal findings of blood chemistry: Secondary | ICD-10-CM

## 2023-08-02 DIAGNOSIS — M19071 Primary osteoarthritis, right ankle and foot: Secondary | ICD-10-CM

## 2023-08-02 DIAGNOSIS — M25511 Pain in right shoulder: Secondary | ICD-10-CM

## 2023-08-02 DIAGNOSIS — Z79899 Other long term (current) drug therapy: Secondary | ICD-10-CM

## 2023-08-02 DIAGNOSIS — G8929 Other chronic pain: Secondary | ICD-10-CM

## 2023-08-02 DIAGNOSIS — M65331 Trigger finger, right middle finger: Secondary | ICD-10-CM | POA: Diagnosis not present

## 2023-08-02 DIAGNOSIS — M19072 Primary osteoarthritis, left ankle and foot: Secondary | ICD-10-CM

## 2023-08-02 DIAGNOSIS — M7062 Trochanteric bursitis, left hip: Secondary | ICD-10-CM

## 2023-08-02 DIAGNOSIS — M0579 Rheumatoid arthritis with rheumatoid factor of multiple sites without organ or systems involvement: Secondary | ICD-10-CM

## 2023-08-02 DIAGNOSIS — Z8701 Personal history of pneumonia (recurrent): Secondary | ICD-10-CM

## 2023-08-02 DIAGNOSIS — Z86718 Personal history of other venous thrombosis and embolism: Secondary | ICD-10-CM

## 2023-08-02 DIAGNOSIS — M654 Radial styloid tenosynovitis [de Quervain]: Secondary | ICD-10-CM

## 2023-08-02 DIAGNOSIS — M19042 Primary osteoarthritis, left hand: Secondary | ICD-10-CM

## 2023-08-02 DIAGNOSIS — E559 Vitamin D deficiency, unspecified: Secondary | ICD-10-CM

## 2023-08-02 DIAGNOSIS — M170 Bilateral primary osteoarthritis of knee: Secondary | ICD-10-CM

## 2023-08-02 DIAGNOSIS — U071 COVID-19: Secondary | ICD-10-CM

## 2023-08-02 DIAGNOSIS — R768 Other specified abnormal immunological findings in serum: Secondary | ICD-10-CM

## 2023-08-02 MED ORDER — PREDNISONE 5 MG PO TABS: ORAL_TABLET | ORAL | 0 refills | Status: DC

## 2023-08-02 NOTE — Patient Instructions (Addendum)
Leflunomide Tablets What is this medication? LEFLUNOMIDE (le FLOO na mide) treats the symptoms of rheumatoid arthritis. It works by slowing down an overactive immune system. This decreases inflammation. It belongs to a group of medications called DMARDs. This medicine may be used for other purposes; ask your health care provider or pharmacist if you have questions. COMMON BRAND NAME(S): Arava What should I tell my care team before I take this medication? They need to know if you have any of these conditions: Cancer Diabetes High blood pressure Immune system problems Infection Kidney disease Liver disease Low blood cell levels (white cells, red cells, and platelets) Lung or breathing disease, such as asthma or COPD Recent or upcoming vaccine Skin conditions Tingling of the fingers or toes, or other nerve disorder An unusual or allergic reaction to leflunomide, other medications, food, dyes, or preservatives Pregnant or trying to get pregnant Breastfeeding How should I use this medication? Take this medication by mouth with a full glass of water. Take it as directed on the prescription label at the same time every day. Keep taking it unless your care team tells you to stop. Talk to your care team about the use of this medication in children. Special care may be needed. Overdosage: If you think you have taken too much of this medicine contact a poison control center or emergency room at once. NOTE: This medicine is only for you. Do not share this medicine with others. What if I miss a dose? If you miss a dose, take it as soon as you can. If it is almost time for your next dose, take only that dose. Do not take double or extra doses. What may interact with this medication? Do not take this medication with any of the following: Teriflunomide This medication may also interact with the following: Alosetron Caffeine Cefaclor Certain medications for diabetes, such as nateglinide,  repaglinide, rosiglitazone, pioglitazone Certain medications for high cholesterol, such as atorvastatin, pravastatin, rosuvastatin, simvastatin Charcoal Cholestyramine Ciprofloxacin Duloxetine Estrogen and progestin hormones Furosemide Ketoprofen Live virus vaccines Medications that increase your risk for infection Methotrexate Mitoxantrone Paclitaxel Penicillin Theophylline Tizanidine Warfarin This list may not describe all possible interactions. Give your health care provider a list of all the medicines, herbs, non-prescription drugs, or dietary supplements you use. Also tell them if you smoke, drink alcohol, or use illegal drugs. Some items may interact with your medicine. What should I watch for while using this medication? Visit your care team for regular checks on your progress. Tell your care team if your symptoms do not start to get better or if they get worse. You may need blood work done while you are taking this medication. This medication may cause serious skin reactions. They can happen weeks to months after starting the medication. Contact your care team right away if you notice fevers or flu-like symptoms with a rash. The rash may be red or purple and then turn into blisters or peeling of the skin. You may also notice a red rash with swelling of the face, lips, or lymph nodes in your neck or under your arms. You should not receive certain vaccines during your treatment and for a certain time after your treatment with this medication ends. Talk to your care team for more information. This medication may stay in your body for up to 2 years after your last dose. Tell your care team about any unusual side effects or symptoms. A medication can be given to help lower your blood levels of this  medication more quickly. Talk to your care team if you may be pregnant. This medication can cause serious birth defects if taken during pregnancy and for a while after the last dose. You will  need a negative pregnancy test before starting this medication. Contraception is recommended while taking this medication and for a while after the last dose. Your care team can help you find the option that works for you. Do not breastfeed while taking this medication. What side effects may I notice from receiving this medication? Side effects that you should report to your care team as soon as possible: Allergic reactions--skin rash, itching, hives, swelling of the face, lips, tongue, or throat Dry cough, shortness of breath or trouble breathing Increase in blood pressure Infection--fever, chills, cough, sore throat, wounds that don't heal, pain or trouble when passing urine, general feeling of discomfort or being unwell Redness, blistering, peeling, or loosening of the skin, including inside the mouth Liver injury--right upper belly pain, loss of appetite, nausea, light-colored stool, dark yellow or brown urine, yellowing skin or eyes, unusual weakness or fatigue Pain, tingling, or numbness in the hands or feet Unusual bruising or bleeding Side effects that usually do not require medical attention (report to your care team if they continue or are bothersome): Back pain Diarrhea Hair loss Headache Nausea This list may not describe all possible side effects. Call your doctor for medical advice about side effects. You may report side effects to FDA at 1-800-FDA-1088. Where should I keep my medication? Keep out of the reach of children and pets. Store at room temperature between 20 and 25 degrees C (68 and 77 degrees F). Protect from moisture and light. Keep the container tightly closed. Get rid of any unused medication after the expiration date. To get rid of medications that are no longer needed or have expired: Take the medication to a medication take-back program. Check with your pharmacy or law enforcement to find a location. If you cannot return the medication, ask your pharmacist or  care team how to get rid of this medication safely. NOTE: This sheet is a summary. It may not cover all possible information. If you have questions about this medicine, talk to your doctor, pharmacist, or health care provider.  2024 Elsevier/Gold Standard (2022-01-14 00:00:00)  Standing Labs We placed an order today for your standing lab work.   Please have your standing labs drawn in 2 weeks x 2 and then every 3 months  Please have your labs drawn 2 weeks prior to your appointment so that the provider can discuss your lab results at your appointment, if possible.  Please note that you may see your imaging and lab results in MyChart before we have reviewed them. We will contact you once all results are reviewed. Please allow our office up to 72 hours to thoroughly review all of the results before contacting the office for clarification of your results.  WALK-IN LAB HOURS  Monday through Thursday from 8:00 am -12:30 pm and 1:00 pm-5:00 pm and Friday from 8:00 am-12:00 pm.  Patients with office visits requiring labs will be seen before walk-in labs.  You may encounter longer than normal wait times. Please allow additional time. Wait times may be shorter on  Monday and Thursday afternoons.  We do not book appointments for walk-in labs. We appreciate your patience and understanding with our staff.   Labs are drawn by [facility]. Please bring your co-pay at the time of your lab draw.  You may receive  a bill from [facility] for your lab work.  Please note if you are on Hydroxychloroquine and and an order has been placed for a Hydroxychloroquine level,  you will need to have it drawn 4 hours or more after your last dose.  If you wish to have your labs drawn at another location, please call the office 24 hours in advance so we can fax the orders.  The office is located at [address], [address], [place], [place] [address]   If you have any questions regarding directions or hours of operation,   please call [phone].   As a reminder, please drink plenty of water prior to coming for your lab work. Thanks!   Vaccines You are taking a medication(s) that can suppress your immune system.  The following immunizations are recommended: Flu annually Covid-19  RSV Td/Tdap (tetanus, diphtheria, pertussis) every 10 years Pneumonia (Prevnar 15 then Pneumovax 23 at least 1 year apart.  Alternatively, can take Prevnar 20 without needing additional dose) Shingrix: 2 doses from 4 weeks to 6 months apart  Please check with your PCP to make sure you are up to date.  If you have signs or symptoms of an infection or start antibiotics: First, call your PCP for workup of your infection. Hold your medication through the infection, until you complete your antibiotics, and until symptoms resolve if you take the following: Injectable medication (Actemra, Benlysta, Cimzia, Cosentyx, Enbrel, Humira, Kevzara, Orencia, Remicade, Simponi, Stelara, Taltz, Tremfya) Methotrexate Leflunomide (Arava) Mycophenolate (Cellcept) Pamela Davenport, Olumiant, or Rinvoq

## 2023-08-03 ENCOUNTER — Other Ambulatory Visit: Payer: Self-pay | Admitting: *Deleted

## 2023-08-03 DIAGNOSIS — Z79899 Other long term (current) drug therapy: Secondary | ICD-10-CM

## 2023-08-03 LAB — COMPLETE METABOLIC PANEL WITH GFR
AG Ratio: 1.1 (calc) (ref 1.0–2.5)
ALT: 19 U/L (ref 6–29)
AST: 16 U/L (ref 10–35)
Albumin: 3.9 g/dL (ref 3.6–5.1)
Alkaline phosphatase (APISO): 80 U/L (ref 37–153)
BUN/Creatinine Ratio: 17 (calc) (ref 6–22)
BUN: 22 mg/dL (ref 7–25)
CO2: 24 mmol/L (ref 20–32)
Calcium: 9.6 mg/dL (ref 8.6–10.4)
Chloride: 105 mmol/L (ref 98–110)
Creat: 1.26 mg/dL — ABNORMAL HIGH (ref 0.50–1.05)
Globulin: 3.6 g/dL (ref 1.9–3.7)
Glucose, Bld: 107 mg/dL — ABNORMAL HIGH (ref 65–99)
Potassium: 4.7 mmol/L (ref 3.5–5.3)
Sodium: 139 mmol/L (ref 135–146)
Total Bilirubin: 0.4 mg/dL (ref 0.2–1.2)
Total Protein: 7.5 g/dL (ref 6.1–8.1)
eGFR: 47 mL/min/{1.73_m2} — ABNORMAL LOW (ref >=60)

## 2023-08-03 MED ORDER — LEFLUNOMIDE 10 MG PO TABS: ORAL_TABLET | ORAL | 0 refills | Status: DC

## 2023-08-03 NOTE — Telephone Encounter (Signed)
-----   Message from [facility] sent at 08/03/2023  8:06 AM EST ----- Liver functions are back to normal now creatinine mildly elevated at 1.26.  Patient was started on leflunomide 10 mg p.o. daily.  Recheck labs CBC with differential and CMP with GFR in 2 weeks.  We may be able to increase leflunomide to 20 mg p.o. da ily if the labs remain stable.

## 2023-08-03 NOTE — Progress Notes (Signed)
Liver functions are back to normal now creatinine mildly elevated at 1.26.  Patient was started on leflunomide 10 mg p.o. daily.  Recheck labs CBC with differential and CMP with GFR in 2 weeks.  We may be able to increase leflunomide to 20 mg p.o. daily if the labs remain stable.

## 2023-08-08 ENCOUNTER — Other Ambulatory Visit: Payer: Self-pay | Admitting: Student

## 2023-08-08 ENCOUNTER — Other Ambulatory Visit (HOSPITAL_COMMUNITY): Payer: Self-pay

## 2023-08-08 DIAGNOSIS — I480 Paroxysmal atrial fibrillation: Secondary | ICD-10-CM

## 2023-08-08 MED ORDER — RIVAROXABAN 20 MG PO TABS
20.0000 mg | ORAL_TABLET | Freq: Every day | ORAL | 1 refills | Status: DC
  Filled 2023-08-08: qty 90, 90d supply, fill #0
  Filled 2023-11-04: qty 90, 90d supply, fill #1
  Filled 2023-11-07: qty 30, 30d supply, fill #1
  Filled 2023-12-08: qty 30, 30d supply, fill #2

## 2023-08-08 NOTE — Telephone Encounter (Signed)
Prescription refill request for Xarelto received.  Indication: PAF Last office visit: 07/25/23  Polly Cobia PA-C Weight: 161.0RU Age: 67 Scr: 1.26 on 08/02/23  Epic CrCl: 80.57  Based on above findings Xarelto 20mg  daily is the appropriate dose.  Refill approved.

## 2023-08-08 NOTE — Telephone Encounter (Signed)
Pt is requesting Pamela Davenport

## 2023-08-08 NOTE — Telephone Encounter (Signed)
*  STAT* If patient is at the pharmacy, call can be transferred to refill team.   1. Which medications need to be refilled? (please list name of each medication and dose if known) rivaroxaban (XARELTO) 20 MG TABS tablet (Expired)     2. Would you like to learn more about the convenience, safety, & potential cost savings by using the [facility]?     3. Are you open to using the [facility] (Type [facility]. ).   4. Which pharmacy/location (including street and city if local pharmacy) is medication to be sent to?[id number] - [place] ([place]), [place] - [address]    5. Do they need a 30 day or 90 day supply? 90

## 2023-08-16 ENCOUNTER — Ambulatory Visit (HOSPITAL_COMMUNITY)
Admission: RE | Admit: 2023-08-16 | Discharge: 2023-08-16 | Disposition: A | Discharge status: Home / Self Care | Payer: Medicare HMO | Source: Ambulatory Visit | Attending: Adult Health | Admitting: Adult Health

## 2023-08-16 ENCOUNTER — Inpatient Hospital Stay: Payer: Medicare HMO | Attending: Hematology and Oncology | Admitting: Adult Health

## 2023-08-16 ENCOUNTER — Encounter: Payer: Self-pay | Admitting: Adult Health

## 2023-08-16 VITALS — BP 113/68 | HR 86 | Temp 97.9°F | Resp 17 | Wt 261.5 lb

## 2023-08-16 DIAGNOSIS — Z170 Estrogen receptor positive status [ER+]: Secondary | ICD-10-CM | POA: Insufficient documentation

## 2023-08-16 DIAGNOSIS — Z87891 Personal history of nicotine dependence: Secondary | ICD-10-CM | POA: Insufficient documentation

## 2023-08-16 DIAGNOSIS — C50312 Malignant neoplasm of lower-inner quadrant of left female breast: Secondary | ICD-10-CM | POA: Insufficient documentation

## 2023-08-16 DIAGNOSIS — M069 Rheumatoid arthritis, unspecified: Secondary | ICD-10-CM | POA: Insufficient documentation

## 2023-08-16 DIAGNOSIS — Z8042 Family history of malignant neoplasm of prostate: Secondary | ICD-10-CM | POA: Insufficient documentation

## 2023-08-16 DIAGNOSIS — Z807 Family history of other malignant neoplasms of lymphoid, hematopoietic and related tissues: Secondary | ICD-10-CM | POA: Insufficient documentation

## 2023-08-16 DIAGNOSIS — R918 Other nonspecific abnormal finding of lung field: Secondary | ICD-10-CM | POA: Diagnosis not present

## 2023-08-16 DIAGNOSIS — I700 Atherosclerosis of aorta: Secondary | ICD-10-CM | POA: Diagnosis not present

## 2023-08-16 DIAGNOSIS — Z800 Family history of malignant neoplasm of digestive organs: Secondary | ICD-10-CM | POA: Diagnosis not present

## 2023-08-16 DIAGNOSIS — C773 Secondary and unspecified malignant neoplasm of axilla and upper limb lymph nodes: Secondary | ICD-10-CM | POA: Insufficient documentation

## 2023-08-16 MED ORDER — LETROZOLE 2.5 MG PO TABS: 2.5000 mg | ORAL_TABLET | Freq: Every day | ORAL | 1 refills | Status: DC

## 2023-08-16 NOTE — Assessment & Plan Note (Signed)
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   Pathology counseling: I discussed the final pathology report of the patient provided  a copy of this report. I discussed the margins as well as lymph node surgeries. We also discussed the final staging along with previously performed ER/PR and HER-2/neu testing.   Treatment plan: Oncotype DX score 18 (distant recurrence at 9 years: 16%) Adjuvant radiation therapy 02/18/2023-04/06/2023 Adjuvant antiestrogen therapy with anastrozole 1 mg daily to start 04/05/2023 (unable to tolerate), changed to Letrozole 07/2023 Breast Cancer Patient recently experienced severe side effects from previous medication. Discussed starting Letrozole as an alternative treatment. -Start Letrozole, 30 day supply. -Contact office if severe side effects occur.  Pulmonary Infection Recent history of pneumonia. Patient reports mild dyspnea on exertion but otherwise stable. -Order chest X-ray today. -Plan for CT scan in 1 month to ensure resolution of infection.  Rheumatoid Arthritis Patient recently started on Arava. Suspect this is main source of fatigue and arthralgias. -Continue Arava as prescribed. -f/u with Dr. Corliss Skains for labs and evaluation as recommended by her.  Follow-up in 5-6 weeks to review CT scan results and assess tolerance of Letrozole.

## 2023-08-16 NOTE — Progress Notes (Signed)
[facility] Cancer Follow up:    Pamela Grandchild, MD [address] [place] [address]   DIAGNOSIS:  Cancer Staging  Malignant neoplasm of lower-inner quadrant of left breast in female, estrogen receptor positive (HCC) Staging form: Breast, AJCC 8th Edition - Clinical stage from 11/15/2022: Stage IA (cT1c, cN0(f), cM0, G1, ER+, PR+, HER2-) - Signed by Ronny Bacon, PA-C on 11/15/2022 Stage prefix: Initial diagnosis Method of lymph node assessment: Core biopsy Histologic grading system: 3 grade system - Pathologic: Stage IA (pT1b, pN1(sn), cM0, G2, ER+, PR+, HER2-) - Signed by Serena Croissant, MD on 01/04/2023 Method of lymph node assessment: Sentinel lymph node biopsy Histologic grading system: 3 grade system   SUMMARY OF ONCOLOGIC HISTORY: Oncology History  Malignant neoplasm of lower-inner quadrant of left breast in female, estrogen receptor positive (HCC)  11/08/2022 Surgery   Screening mammogram detected left breast masses at 8 o'clock position: 1.1 cm: Grade 1 IDC ER 95%, PR 80%, HER2 negative 0.7 cm mass: Grade 1 IDC ER 100%, PR 90%, HER2 negative, Ki-67 10%, multiple lymph nodes 1 was biopsied and benign discordant   11/15/2022 Initial Diagnosis   Primary malignant neoplasm of lower outer quadrant of female breast, left (HCC)   11/15/2022 Cancer Staging   Staging form: Breast, AJCC 8th Edition - Clinical stage from 11/15/2022: Stage IA (cT1c, cN0(f), cM0, G1, ER+, PR+, HER2-) - Signed by Ronny Bacon, PA-C on 11/15/2022 Stage prefix: Initial diagnosis Method of lymph node assessment: Core biopsy Histologic grading system: 3 grade system    Genetic Testing   [facility] Custom Cancer Panel+RNA was Negative. Report date is 11/29/2022.  The Custom Hereditary Cancers Panel offered by [facility] includes sequencing and/or deletion duplication testing of the following 43 genes: APC, ATM, AXIN2, BAP1, BARD1, BMPR1A, BRCA1, BRCA2, BRIP1, CDH1, CDK4, CDKN2A  (p14ARF and p16INK4a only), CHEK2, CTNNA1, EPCAM (Deletion/duplication testing only), FH, GREM1 (promoter region duplication testing only), HOXB13, KIT, MBD4, MEN1, MLH1, MSH2, MSH3, MSH6, MUTYH, NF1, NHTL1, PALB2, PDGFRA, PMS2, POLD1, POLE, PTEN, RAD51C, RAD51D, SMAD4, SMARCA4. STK11, TP53, TSC1, TSC2, and VHL.   12/21/2022 Surgery   Left lumpectomy: 2 foci IDC 1 with lobular features 1 cm and 0.9 cm grade 1, grade 2 DCIS, LCIS, margins negative, LVI present, ER 95%, PR 80%, HER2 2+ by IHC, FISH negative, Ki-67 10% 1/4 lymph nodes positive, another lymph node with micrometastasis   12/21/2022 Oncotype testing   18/16%   01/04/2023 Cancer Staging   Staging form: Breast, AJCC 8th Edition - Pathologic: Stage IA (pT1b, pN1(sn), cM0, G2, ER+, PR+, HER2-) - Signed by Serena Croissant, MD on 01/04/2023 Method of lymph node assessment: Sentinel lymph node biopsy Histologic grading system: 3 grade system   02/17/2023 - 04/06/2023 Radiation Therapy   Plan Name: Breast_L_BH Site: Breast, Left Technique: 3D Mode: Photon Dose Per Fraction: 1.8 Gy Prescribed Dose (Delivered / Prescribed): 18 Gy / 18 Gy Prescribed Fxs (Delivered / Prescribed): 10 / 10   Plan Name: Breast_L_BH:1 Site: Breast, Left Technique: 3D Mode: Photon Dose Per Fraction: 1.8 Gy Prescribed Dose (Delivered / Prescribed): 32.4 Gy / 32.4 Gy Prescribed Fxs (Delivered / Prescribed): 18 / 18   Plan Name: Brst_L_Scv_BH Site: Sclav-LT Technique: 3D Mode: Photon Dose Per Fraction: 1.8 Gy Prescribed Dose (Delivered / Prescribed): 50.4 Gy / 50.4 Gy Prescribed Fxs (Delivered / Prescribed): 28 / 28   Plan Name: Brst_L_Bst_BH Site: Breast, Left Technique: 3D Mode: Photon Dose Per Fraction: 2 Gy Prescribed Dose (Delivered / Prescribed): 10  Gy / 10 Gy Prescribed Fxs (Delivered / Prescribed): 5 / 5     03/2023 -  Anti-estrogen oral therapy   Anastrozole x 7 years     CURRENT THERAPY: Anastrozole  INTERVAL HISTORY: Pamela Davenport 67  y.o. female with a history of breast cancer, rheumatoid arthritis, and recent pneumonia, presents for follow-up.  At her last appointment, she had a previously been diagnosed with pneumonia after being increasingly weak shortly after starting anastrozole.  They report feeling "okay" with occasional shortness of breath on exertion. They have been experiencing intermittent weakness in their legs and difficulty moving their fingers, particularly in the morning. They also report shoulder pain, which they attribute to using side rails due to leg weakness.  The patient recently started a new medication, Arava, for rheumatoid arthritis, which they have been taking for a week. They also mention stopping Tylenol due to liver concerns, which has left them struggling with pain management.  The patient had a recent episode of severe illness, which they describe as the worst they've ever felt. They attribute this to a "perfect storm" of flu, pneumonia, and other factors. They express fear of returning to that state of health.  The patient is also dealing with weight management issues, reporting a recent 2-pound weight gain that they are unhappy about. They express a desire to stay healthy and continue trying to lose weight.  D chest on November 17 was concerning for pneumonia.  She has undergone treatment and is due for chest x-ray today.  Repeat CT chest was recommended to occur in 8 to 12 weeks from the initial scan.  Patient Active Problem List   Diagnosis Date Noted   Genetic testing 11/30/2022   Family history of prostate cancer 11/19/2022   Malignant neoplasm of lower-inner quadrant of left breast in female, estrogen receptor positive (HCC) 11/15/2022   Anemia due to acquired thiamine deficiency 05/07/2022   Deficiency anemia 05/04/2022   Gastroesophageal reflux disease with esophagitis without hemorrhage 05/04/2022   Esophageal dysphagia 05/04/2022   Intermittent palpitations 05/04/2022   OAB  (overactive bladder) 12/31/2021   Encounter for general adult medical examination with abnormal findings 08/26/2021   Obesity, morbid, BMI 40.0-49.9 (HCC) 08/26/2021   PAF (paroxysmal atrial fibrillation) (HCC) 08/26/2021   GERD without esophagitis 10/04/2019   Vitamin D deficiency disease 09/25/2019   Hypercoagulable state (HCC) 09/24/2019   Iron deficiency anemia 12/07/2018   Other dietary vitamin B12 deficiency anemia 12/07/2018   Visit for screening mammogram 04/18/2017   Hyperlipidemia with target LDL less than 130 10/19/2016   Essential hypertension 01/13/2015   Chronic pulmonary embolism (HCC) 12/05/2008    is allergic to methylprednisolone, sulfa antibiotics, sulfonamide derivatives, and macrobid [nitrofurantoin].  MEDICAL HISTORY: Past Medical History:  Diagnosis Date   Atypical chest pain    normal myoview 11/11/10   Blood transfusion without reported diagnosis    Cancer (HCC)    DVT (deep venous thrombosis) (HCC)    recurrent; chronically anticoagulated with coumadin   Dysrhythmia    Family history of anesthesia complication    Sister had decrease in respirations after surgery   Heart murmur    Herniated lumbar intervertebral disc    Hypertension    Paroxysmal atrial fibrillation (HCC)    SINCE AGE 32    PE (pulmonary embolism)    chronically anticoagulated with coumadin   Pneumonia 07/20/2012   PONV (postoperative nausea and vomiting)    Rheumatoid arthritis (HCC)    Sickle cell trait (HCC)  Sinus congestion 08/14/2018   C/O NASAL/CHEST  CONGESTION, COUGHING WITH PHLEGM, PER PATIENT , SHE IS TO BEGIN TAKING AUGMENTIN DOSE PACK TODAY;     Strep throat at age 48   The patient reports a severe strep throat infection at age 66 and   is not clear as to whether or not she may have had rheumatic fever.   Symptomatic premature ventricular contractions    improved s/p ablation   Urgency of urination    Uterine bleeding 2008   Uterine artery embolization in 2008 for  uterine bleeding.     SURGICAL HISTORY: Past Surgical History:  Procedure Laterality Date   BREAST BIOPSY Left 11/08/2022   [place] LT BREAST BX W LOC DEV EA ADD LESION IMG BX SPEC US GUIDE 11/08/2022 [facility]   BREAST BIOPSY Left 11/08/2022   [place] LT BREAST BX W LOC DEV 1ST LESION IMG BX SPEC US GUIDE 11/08/2022 [facility]   BREAST BIOPSY Left 12/17/2022   [place] LT RADIOACTIVE SEED LOC 12/17/2022 [facility]   BREAST BIOPSY Left 12/17/2022   [place] LT RADIOACTIVE SEED EA ADD LESION 12/17/2022 [facility]   BREAST BIOPSY Left 12/17/2022   [place] LT RADIOACTIVE SEED EA ADD LESION 12/17/2022 [facility]   BREAST LUMPECTOMY WITH RADIOACTIVE SEED AND SENTINEL LYMPH NODE BIOPSY Left 12/21/2022   Procedure: LEFT BREAST BRACKETED SEED LUMPECTOMY AND LEFT SENTINEL LYMPH NODE MAPPING;  Surgeon: Harriette Bouillon, MD;  Location: [facility];  Service: General;  Laterality: Left;  GEN & PEC BLOCK   COLONOSCOPY W/ POLYPECTOMY     Diagnostic D&C hysteroscopy and Novasure ablation  03/17/2004   DILATION AND CURETTAGE OF UTERUS  12/17/2011   Procedure: DILATATION AND CURETTAGE;  Surgeon: Kathreen Cosier, MD;  Location: [facility];  Service: Gynecology;  Laterality: N/A;  With Attempted Hydrothermal Ablation   LUMBAR LAMINECTOMY/DECOMPRESSION MICRODISCECTOMY Left 02/21/2013   Procedure: LUMBAR LAMINECTOMY/DECOMPRESSION MICRODISCECTOMY 1 LEVEL;  Surgeon: Carmela Hurt, MD;  Location: [facility];  Service: Neurosurgery;  Laterality: Left;  LEFT Lumbar Three-Four Laminotomy foraminotomy microdiskectomy   MANDIBLE SURGERY     under bite   PVC Ablation  2010   ROBOTIC ASSISTED TOTAL HYSTERECTOMY WITH BILATERAL SALPINGO OOPHERECTOMY Bilateral 08/21/2018   Procedure: XI ROBOTIC ASSISTED TOTAL HYSTERECTOMY WITH BILATERAL SALPINGO OOPHORECTOMY;  Surgeon: Adolphus Birchwood, MD;  Location: [facility];  Service: Gynecology;  Laterality: Bilateral;   SPINE SURGERY     TONSILLECTOMY      UTERINE ARTERY EMBOLIZATION  2008   for uterine bleeding   VASCULAR SURGERY     laser of left leg    SOCIAL HISTORY: Social History   Socioeconomic History   Marital status: Single    Spouse name: Not on file   Number of children: Not on file   Years of education: Not on file   Highest education level: Not on file  Occupational History   Occupation: Nurse  Tobacco Use   Smoking status: Former    Current packs/day: 0.00    Average packs/day: 1 pack/day for 2.0 years (2.0 ttl pk-yrs)    Types: Cigarettes    Start date: 02/12/1977    Quit date: 02/13/1979    Years since quitting: 44.5    Passive exposure: Never   Smokeless tobacco: Never  Vaping Use   Vaping status: Never Used  Substance and Sexual Activity   Alcohol use: Yes    Comment: rarely   Drug use: No   Sexual activity: Not on  file  Other Topics Concern   Not on file  Social History Narrative   Works as a Engineer, civil (consulting) at [employer]   Social Drivers of Health   Financial Resource Strain: Not on file  Food Insecurity: No Food Insecurity (01/19/2023)   Hunger Vital Sign    Worried About Running Out of Food in the Last Year: Never true    Ran Out of Food in the Last Year: Never true  Transportation Needs: No Transportation Needs (01/19/2023)   PRAPARE - Administrator, Civil Service (Medical): No    Lack of Transportation (Non-Medical): No  Physical Activity: Not on file  Stress: Not on file  Social Connections: Not on file  Intimate Partner Violence: Not At Risk (01/19/2023)   Humiliation, Afraid, Rape, and Kick questionnaire    Fear of Current or Ex-Partner: No    Emotionally Abused: No    Physically Abused: No    Sexually Abused: No    FAMILY HISTORY: Family History  Problem Relation Age of Onset   Hypertension Mother    Diabetes Mother    Uterine cancer Sister 40   Prostate cancer Brother 30   Prostate cancer Brother 92   Prostate cancer Brother    Prostate cancer Maternal Uncle     Prostate cancer Maternal Uncle    Non-Hodgkin's lymphoma Paternal Aunt 31   Pancreatic cancer Paternal Aunt 21   Colon cancer Paternal Grandmother 64   Healthy Son    Breast cancer Neg Hx     Review of Systems  Constitutional:  Positive for fatigue. Negative for appetite change, chills, fever and unexpected weight change.  HENT:   Negative for hearing loss, lump/mass and trouble swallowing.   Eyes:  Negative for eye problems and icterus.  Respiratory:  Negative for chest tightness, cough and shortness of breath.   Cardiovascular:  Negative for chest pain, leg swelling and palpitations.  Gastrointestinal:  Negative for abdominal distention, abdominal pain, constipation, diarrhea, nausea and vomiting.  Endocrine: Negative for hot flashes.  Genitourinary:  Negative for difficulty urinating.   Musculoskeletal:  Positive for arthralgias.  Skin:  Negative for itching and rash.  Neurological:  Negative for dizziness, extremity weakness, headaches and numbness.  Hematological:  Negative for adenopathy. Does not bruise/bleed easily.  Psychiatric/Behavioral:  Negative for depression. The patient is not nervous/anxious.       PHYSICAL EXAMINATION   Onc Performance Status - 08/16/23 1000       KPS SCALE   KPS % SCORE Normal, no compliants, no evidence of disease             Vitals:   08/16/23 1014  BP: 113/68  Pulse: 86  Resp: 17  Temp: 97.9 F (36.6 C)  SpO2: 99%    Physical Exam Constitutional:      General: She is not in acute distress.    Appearance: Normal appearance. She is not toxic-appearing.  HENT:     Head: Normocephalic and atraumatic.     Mouth/Throat:     Mouth: Mucous membranes are moist.     Pharynx: Oropharynx is clear. No oropharyngeal exudate or posterior oropharyngeal erythema.  Eyes:     General: No scleral icterus. Cardiovascular:     Rate and Rhythm: Normal rate and regular rhythm.     Pulses: Normal pulses.     Heart sounds: Normal heart  sounds.  Pulmonary:     Effort: Pulmonary effort is normal.     Breath sounds: Normal breath sounds.  Abdominal:     General: Abdomen is flat. Bowel sounds are normal. There is no distension.     Palpations: Abdomen is soft.     Tenderness: There is no abdominal tenderness.  Musculoskeletal:        General: No swelling.     Cervical back: Neck supple.  Lymphadenopathy:     Cervical: No cervical adenopathy.  Skin:    General: Skin is warm and dry.     Findings: No rash.  Neurological:     General: No focal deficit present.     Mental Status: She is alert.  Psychiatric:        Mood and Affect: Mood normal.        Behavior: Behavior normal.     LABORATORY DATA:  CBC    Component Value Date/Time   WBC 9.0 07/17/2023 1249   RBC 3.71 (L) 07/17/2023 1249   HGB 10.2 (L) 07/17/2023 1249   HGB 11.1 (L) 11/17/2022 1201   HGB 12.4 03/30/2017 0952   HCT 30.5 (L) 07/17/2023 1249   HCT 36.6 03/30/2017 0952   PLT 465 (H) 07/17/2023 1249   PLT 275 11/17/2022 1201   PLT 258 03/30/2017 0952   MCV 82.2 07/17/2023 1249   MCV 80 03/30/2017 0952   MCH 27.5 07/17/2023 1249   MCHC 33.4 07/17/2023 1249   RDW 13.3 07/17/2023 1249   RDW 13.2 03/30/2017 0952   LYMPHSABS 0.5 (L) 07/17/2023 1249   LYMPHSABS 1.6 03/30/2017 0952   MONOABS 0.6 07/17/2023 1249   EOSABS 0.3 07/17/2023 1249   EOSABS 0.1 03/30/2017 0952   BASOSABS 0.1 07/17/2023 1249   BASOSABS 0.0 03/30/2017 0952     ASSESSMENT and THERAPY PLAN:   Malignant neoplasm of lower-inner quadrant of left breast in female, estrogen receptor positive (HCC) 12/21/2022:Left lumpectomy: 2 foci of grade 1IDC one with lobular features 1 cm and 0.9 cm , int grade DCIS, LCIS, margins negative, LVI present, ER 95%, PR 80%, HER2 2+ by IHC, FISH negative, Ki-67 10% 1/4 lymph nodes positive for macrometastases and 1 additional lymph node positive for micrometastases.   Pathology counseling: I discussed the final pathology report of the patient  provided  a copy of this report. I discussed the margins as well as lymph node surgeries. We also discussed the final staging along with previously performed ER/PR and HER-2/neu testing.   Treatment plan: Oncotype DX score 18 (distant recurrence at 9 years: 16%) Adjuvant radiation therapy 02/18/2023-04/06/2023 Adjuvant antiestrogen therapy with anastrozole 1 mg daily to start 04/05/2023 (unable to tolerate), changed to Letrozole 07/2023 Breast Cancer Patient recently experienced severe side effects from previous medication. Discussed starting Letrozole as an alternative treatment. -Start Letrozole, 30 day supply. -Contact office if severe side effects occur.  Pulmonary Infection Recent history of pneumonia. Patient reports mild dyspnea on exertion but otherwise stable. -Order chest X-ray today. -Plan for CT scan in 1 month to ensure resolution of infection.  Rheumatoid Arthritis Patient recently started on Arava. Suspect this is main source of fatigue and arthralgias. -Continue Arava as prescribed. -f/u with Dr. Corliss Skains for labs and evaluation as recommended by her.  Follow-up in 5-6 weeks to review CT scan results and assess tolerance of Letrozole.    All questions were answered. The patient knows to call the clinic with any problems, questions or concerns. We can certainly see the patient much sooner if necessary.  Total encounter time:30 minutes*in face-to-face visit time, chart review, lab review, care coordination, order entry, and documentation of  the encounter time.    Lillard Anes, NP 08/16/23 11:06 AM Medical Oncology and Hematology [facility] [address] [place], [place] [address] Tel. [phone]    Fax. [phone]  *Total Encounter Time as defined by the Centers for Medicare and Medicaid Services includes, in addition to the face-to-face time of a patient visit (documented in the note above) non-face-to-face time: obtaining and reviewing outside  history, ordering and reviewing medications, tests or procedures, care coordination (communications with other health care professionals or caregivers) and documentation in the medical record.

## 2023-08-22 ENCOUNTER — Other Ambulatory Visit: Payer: Self-pay | Admitting: *Deleted

## 2023-08-22 DIAGNOSIS — Z79899 Other long term (current) drug therapy: Secondary | ICD-10-CM | POA: Diagnosis not present

## 2023-08-23 LAB — CBC WITH DIFFERENTIAL/PLATELET
Absolute Lymphocytes: 442 {cells}/uL — ABNORMAL LOW (ref 850–3900)
Absolute Monocytes: 414 {cells}/uL (ref 200–950)
Basophils Absolute: 28 {cells}/uL (ref 0–200)
Basophils Relative: 0.3 %
Eosinophils Absolute: 38 {cells}/uL (ref 15–500)
Eosinophils Relative: 0.4 %
HCT: 35 % (ref 35.0–45.0)
Hemoglobin: 11.2 g/dL — ABNORMAL LOW (ref 11.7–15.5)
MCH: 26.4 pg — ABNORMAL LOW (ref 27.0–33.0)
MCHC: 32 g/dL (ref 32.0–36.0)
MCV: 82.5 fL (ref 80.0–100.0)
MPV: 9.9 fL (ref 7.5–12.5)
Monocytes Relative: 4.4 %
Neutro Abs: 8479 {cells}/uL — ABNORMAL HIGH (ref 1500–7800)
Neutrophils Relative %: 90.2 %
Platelets: 312 10*3/uL (ref 140–400)
RBC: 4.24 10*6/uL (ref 3.80–5.10)
RDW: 15.3 % — ABNORMAL HIGH (ref 11.0–15.0)
Total Lymphocyte: 4.7 %
WBC: 9.4 10*3/uL (ref 3.8–10.8)

## 2023-08-23 LAB — COMPLETE METABOLIC PANEL WITH GFR
AG Ratio: 1.2 (calc) (ref 1.0–2.5)
ALT: 15 U/L (ref 6–29)
AST: 19 U/L (ref 10–35)
Albumin: 4.1 g/dL (ref 3.6–5.1)
Alkaline phosphatase (APISO): 85 U/L (ref 37–153)
BUN/Creatinine Ratio: 15 (calc) (ref 6–22)
BUN: 17 mg/dL (ref 7–25)
CO2: 23 mmol/L (ref 20–32)
Calcium: 9.6 mg/dL (ref 8.6–10.4)
Chloride: 104 mmol/L (ref 98–110)
Creat: 1.14 mg/dL — ABNORMAL HIGH (ref 0.50–1.05)
Globulin: 3.5 g/dL (ref 1.9–3.7)
Glucose, Bld: 100 mg/dL — ABNORMAL HIGH (ref 65–99)
Potassium: 4.2 mmol/L (ref 3.5–5.3)
Sodium: 139 mmol/L (ref 135–146)
Total Bilirubin: 0.4 mg/dL (ref 0.2–1.2)
Total Protein: 7.6 g/dL (ref 6.1–8.1)
eGFR: 53 mL/min/{1.73_m2} — ABNORMAL LOW (ref >=60)

## 2023-08-24 NOTE — Progress Notes (Signed)
Hemoglobin is low and stable.  Absolute lymphocyte count is low due to immunosuppression.  Creatinine is mildly elevated and stable.  Please forward results to her PCP.

## 2023-08-26 ENCOUNTER — Ambulatory Visit
Admission: EM | Admit: 2023-08-26 | Discharge: 2023-08-26 | Disposition: A | Discharge status: Home / Self Care | Payer: Medicare HMO | Attending: Family Medicine | Admitting: Family Medicine

## 2023-08-26 ENCOUNTER — Other Ambulatory Visit: Payer: Self-pay

## 2023-08-26 ENCOUNTER — Ambulatory Visit: Payer: Medicare HMO

## 2023-08-26 DIAGNOSIS — I700 Atherosclerosis of aorta: Secondary | ICD-10-CM | POA: Diagnosis not present

## 2023-08-26 DIAGNOSIS — M25512 Pain in left shoulder: Secondary | ICD-10-CM

## 2023-08-26 DIAGNOSIS — Z79899 Other long term (current) drug therapy: Secondary | ICD-10-CM

## 2023-08-26 MED ORDER — OXYCODONE-ACETAMINOPHEN 5-325 MG PO TABS: 1.0000 | ORAL_TABLET | Freq: Three times a day (TID) | ORAL | 0 refills | Status: DC | PRN

## 2023-08-26 MED ORDER — LEFLUNOMIDE 20 MG PO TABS: 20.0000 mg | ORAL_TABLET | Freq: Every day | ORAL | 0 refills | Status: DC

## 2023-08-26 NOTE — Telephone Encounter (Signed)
Ok to increase arava to 20 mg daily. Ok to send in new prescription for arava 20 mg 1 tablet by mouth daily-please make the patient aware that the dose of arava will be changing from a 10 mg tablet to a 20 mg tablet so she will only need to take one 20 mg daily.  Recheck CBC and CMP in 2 weeks.

## 2023-08-26 NOTE — Telephone Encounter (Signed)
Contacted the patient and advised Ok to increase arava to 20 mg daily. Ok to send in new prescription for arava 20 mg 1 tablet by mouth daily-please make the patient aware that the dose of arava will be changing from a 10 mg tablet to a 20 mg tablet so she will only need to take one 20 mg daily. Recheck CBC and CMP in 2 weeks. Patient verbalized understanding. Please review and sign.

## 2023-08-26 NOTE — Telephone Encounter (Signed)
Patient contacted the office and states she was started on Leflunomide 1 tablet daily and was supposed to increase to two tablets daily if her most recent lab results were good. Patient states she does not have enough medication to last her if she is supposed to move to two tablets daily. Patient inquires if she is good to move up to two tablets and if she is she needs a refill sent to [facility] on [address]. Please advise.

## 2023-08-26 NOTE — Discharge Instructions (Addendum)
Advised patient to take medication as directed.  Advised patient pain medication can be sedating so no operation of machinery or driving.  Encouraged to increase daily water intake to 64 ounces per day while taking this medication.  Advised we will follow-up with x-ray results once received.  Advised if symptoms worsen and/or unresolved please follow-up with PCP or here for further evaluation.

## 2023-08-26 NOTE — ED Provider Notes (Signed)
[facility]    CSN: [id number] Arrival date & time: 08/26/23  1828      History   Chief Complaint Chief Complaint  Patient presents with   Shoulder Pain    HPI DOLLINE MANCIA is a 67 y.o. female.   HPI patient presents with left shoulder pain for over 1 month with difficulty moving arm in certain directions.  Patient reports breast cancer diagnosis this year with chemotherapy and current treatment with Anastazole.  The patient was experience an RA flare and was taken off this medication last month.  Patient reports was evaluated by rheumatologist 2 weeks ago and was placed on new medication and extended steroids with no improvement.  Past Medical History:  Diagnosis Date   Atypical chest pain    normal myoview 11/11/10   Blood transfusion without reported diagnosis    Cancer (HCC)    DVT (deep venous thrombosis) (HCC)    recurrent; chronically anticoagulated with coumadin   Dysrhythmia    Family history of anesthesia complication    Sister had decrease in respirations after surgery   Heart murmur    Herniated lumbar intervertebral disc    Hypertension    Paroxysmal atrial fibrillation (HCC)    SINCE AGE 30    PE (pulmonary embolism)    chronically anticoagulated with coumadin   Pneumonia 07/20/2012   PONV (postoperative nausea and vomiting)    Rheumatoid arthritis (HCC)    Sickle cell trait (HCC)    Sinus congestion 08/14/2018   C/O NASAL/CHEST  CONGESTION, COUGHING WITH PHLEGM, PER PATIENT , SHE IS TO BEGIN TAKING AUGMENTIN DOSE PACK TODAY;     Strep throat at age 55   The patient reports a severe strep throat infection at age 50 and   is not clear as to whether or not she may have had rheumatic fever.   Symptomatic premature ventricular contractions    improved s/p ablation   Urgency of urination    Uterine bleeding 2008   Uterine artery embolization in 2008 for uterine bleeding.     Patient Active Problem List   Diagnosis Date Noted   Genetic  testing 11/30/2022   Family history of prostate cancer 11/19/2022   Malignant neoplasm of lower-inner quadrant of left breast in female, estrogen receptor positive (HCC) 11/15/2022   Anemia due to acquired thiamine deficiency 05/07/2022   Deficiency anemia 05/04/2022   Gastroesophageal reflux disease with esophagitis without hemorrhage 05/04/2022   Esophageal dysphagia 05/04/2022   Intermittent palpitations 05/04/2022   OAB (overactive bladder) 12/31/2021   Encounter for general adult medical examination with abnormal findings 08/26/2021   Obesity, morbid, BMI 40.0-49.9 (HCC) 08/26/2021   PAF (paroxysmal atrial fibrillation) (HCC) 08/26/2021   GERD without esophagitis 10/04/2019   Vitamin D deficiency disease 09/25/2019   Hypercoagulable state (HCC) 09/24/2019   Iron deficiency anemia 12/07/2018   Other dietary vitamin B12 deficiency anemia 12/07/2018   Visit for screening mammogram 04/18/2017   Hyperlipidemia with target LDL less than 130 10/19/2016   Essential hypertension 01/13/2015   Chronic pulmonary embolism (HCC) 12/05/2008    Past Surgical History:  Procedure Laterality Date   BREAST BIOPSY Left 11/08/2022   [place] LT BREAST BX W LOC DEV EA ADD LESION IMG BX SPEC US GUIDE 11/08/2022 [facility]   BREAST BIOPSY Left 11/08/2022   [place] LT BREAST BX W LOC DEV 1ST LESION IMG BX SPEC US GUIDE 11/08/2022 [facility]   BREAST BIOPSY Left 12/17/2022   [place] LT RADIOACTIVE SEED LOC  12/17/2022 [facility]   BREAST BIOPSY Left 12/17/2022   [place] LT RADIOACTIVE SEED EA ADD LESION 12/17/2022 [facility]   BREAST BIOPSY Left 12/17/2022   [place] LT RADIOACTIVE SEED EA ADD LESION 12/17/2022 [facility]   BREAST LUMPECTOMY WITH RADIOACTIVE SEED AND SENTINEL LYMPH NODE BIOPSY Left 12/21/2022   Procedure: LEFT BREAST BRACKETED SEED LUMPECTOMY AND LEFT SENTINEL LYMPH NODE MAPPING;  Surgeon: Harriette Bouillon, MD;  Location: [facility];  Service: General;  Laterality: Left;   GEN & PEC BLOCK   COLONOSCOPY W/ POLYPECTOMY     Diagnostic D&C hysteroscopy and Novasure ablation  03/17/2004   DILATION AND CURETTAGE OF UTERUS  12/17/2011   Procedure: DILATATION AND CURETTAGE;  Surgeon: Kathreen Cosier, MD;  Location: [facility];  Service: Gynecology;  Laterality: N/A;  With Attempted Hydrothermal Ablation   LUMBAR LAMINECTOMY/DECOMPRESSION MICRODISCECTOMY Left 02/21/2013   Procedure: LUMBAR LAMINECTOMY/DECOMPRESSION MICRODISCECTOMY 1 LEVEL;  Surgeon: Carmela Hurt, MD;  Location: [facility];  Service: Neurosurgery;  Laterality: Left;  LEFT Lumbar Three-Four Laminotomy foraminotomy microdiskectomy   MANDIBLE SURGERY     under bite   PVC Ablation  2010   ROBOTIC ASSISTED TOTAL HYSTERECTOMY WITH BILATERAL SALPINGO OOPHERECTOMY Bilateral 08/21/2018   Procedure: XI ROBOTIC ASSISTED TOTAL HYSTERECTOMY WITH BILATERAL SALPINGO OOPHORECTOMY;  Surgeon: Adolphus Birchwood, MD;  Location: [facility] [place];  Service: Gynecology;  Laterality: Bilateral;   SPINE SURGERY     TONSILLECTOMY     UTERINE ARTERY EMBOLIZATION  2008   for uterine bleeding   VASCULAR SURGERY     laser of left leg    OB History   No obstetric history on file.      Home Medications    Prior to Admission medications   Medication Sig Start Date End Date Taking? Authorizing Provider  ALPRAZolam Prudy Feeler) 0.5 MG tablet Take 1 tablet (0.5 mg total) by mouth 2 (two) times daily. Patient not taking: Reported on 08/16/2023 11/22/22   Brock Bad, MD  amLODipine (NORVASC) 10 MG tablet Take 1 tablet (10 mg total) by mouth at bedtime. 06/28/23   Lanier Prude, MD  Black Pepper-Turmeric (TURMERIC PLUS BLACK PEPPER EXT PO) Take by mouth daily.    [provider]  esomeprazole (NEXIUM) 40 MG capsule Take 1 capsule (40 mg total) by mouth daily. 01/27/23   [name], MD  flecainide (TAMBOCOR) 150 MG tablet Take 2 tablets by mouth at the onset of AFIB Patient not taking: Reported on  08/16/2023 06/22/21   Graciella Freer, PA-C  hydroxychloroquine (PLAQUENIL) 200 MG tablet Take 1 tablet (200 mg total) by mouth 2 (two) times daily. 05/12/23   Gearldine Bienenstock, PA-C  irbesartan (AVAPRO) 300 MG tablet Take 1 tablet (300 mg total) by mouth daily. 01/27/23   Lanier Prude, MD  leflunomide (ARAVA) 10 MG tablet Take 1 tablet po daily x 2 weeks. If labs are stable increase to 2 tablets po daily. 08/03/23   Pollyann Savoy, MD  leflunomide (ARAVA) 20 MG tablet Take 1 tablet (20 mg total) by mouth daily. 08/26/23   Gearldine Bienenstock, PA-C  letrozole [facility]) 2.5 MG tablet Take 1 tablet (2.5 mg total) by mouth daily. 08/16/23   Loa Socks, NP  nadolol (CORGARD) 20 MG tablet Take 1 tablet (20 mg total) by mouth daily. 07/25/23   Graciella Freer, PA-C  nystatin-triamcinolone ointment [facility]) Apply to affected areas two times a day Patient not taking: Reported on 08/16/2023 08/09/21  oxyCODONE-acetaminophen (PERCOCET/ROXICET) 5-325 MG tablet Take 1 tablet by mouth every 8 (eight) hours as needed for severe pain (pain score 7-10). 08/26/23  Yes Trevor Iha, FNP  predniSONE (DELTASONE) 5 MG tablet TAKE 4 TABS BY MOUTH DAILY FOR 4 DAYS, 3 TABS BY MOUTH DAILY FOR 4 DAYS, 2 TABS BY MOUTH DAILY FOR 4 DAYS THEN 1 TAB DAILY FOR 4 DAYS. 07/17/23   Roemhildt, Lorin T, PA-C  predniSONE (DELTASONE) 5 MG tablet Take 2 tabs po x 7 days, 1.5  tabs po x 7 days, 1 tab po x 14 days, 0.5 tab po x 14 days 08/02/23   Pollyann Savoy, MD  rivaroxaban (XARELTO) 20 MG TABS tablet Take 1 tablet (20 mg total) by mouth daily with supper. 08/08/23 08/07/24  Lanier Prude, MD  solifenacin (VESICARE) 5 MG tablet Take 5 mg by mouth daily. Patient not taking: Reported on 08/16/2023    [provider]  TART CHERRY PO Take 2 tablets by mouth daily.    [provider]  thiamine (VITAMIN B-1) 50 MG tablet Take 1 tablet (50 mg total) by mouth daily. 05/07/22   [name], MD    Family History Family History  Problem Relation Age of Onset   Hypertension Mother    Diabetes Mother    Uterine cancer Sister 53   Prostate cancer Brother 63   Prostate cancer Brother 29   Prostate cancer Brother    Prostate cancer Maternal Uncle    Prostate cancer Maternal Uncle    Non-Hodgkin's lymphoma Paternal Aunt 27   Pancreatic cancer Paternal Aunt 59   Colon cancer Paternal Grandmother 20   Healthy Son    Breast cancer Neg Hx     Social History Social History   Tobacco Use   Smoking status: Former    Current packs/day: 0.00    Average packs/day: 1 pack/day for 2.0 years (2.0 ttl pk-yrs)    Types: Cigarettes    Start date: 02/12/1977    Quit date: 02/13/1979    Years since quitting: 44.5    Passive exposure: Never   Smokeless tobacco: Never  Vaping Use   Vaping status: Never Used  Substance Use Topics   Alcohol use: Yes    Comment: rarely   Drug use: No     Allergies   Methylprednisolone, Sulfa antibiotics, Sulfonamide derivatives, and Macrobid [nitrofurantoin]   Review of Systems Review of Systems  Musculoskeletal:        Left shoulder pain x 1 month  All other systems reviewed and are negative.    Physical Exam Triage Vital Signs ED Triage Vitals [08/26/23 1838]  Encounter Vitals Group     BP 125/81     Systolic BP Percentile      Diastolic BP Percentile      Pulse Rate 88     Resp (!) 22     Temp 99.1 F (37.3 C)     Temp src      SpO2 98 %     Weight      Height      Head Circumference      Peak Flow      Pain Score 5     Pain Loc      Pain Education      Exclude from Growth Chart    No data found.  Updated Vital Signs BP 125/81   Pulse 88   Temp 99.1 F (37.3 C)   Resp (!) 22   SpO2  98%    Physical Exam Vitals and nursing note reviewed.  Constitutional:      Appearance: Normal appearance. She is normal weight.  HENT:     Head: Normocephalic and atraumatic.     Mouth/Throat:     Mouth: Mucous membranes  are moist.     Pharynx: Oropharynx is clear.  Eyes:     Extraocular Movements: Extraocular movements intact.     Conjunctiva/sclera: Conjunctivae normal.     Pupils: Pupils are equal, round, and reactive to light.  Cardiovascular:     Rate and Rhythm: Normal rate and regular rhythm.     Pulses: Normal pulses.     Heart sounds: Normal heart sounds. No murmur heard. Pulmonary:     Effort: Pulmonary effort is normal.     Breath sounds: Normal breath sounds. No wheezing, rhonchi or rales.  Musculoskeletal:        General: Normal range of motion.     Cervical back: Normal range of motion and neck supple.     Comments: Left shoulder pain: TTP over GH and AC joints, exam limited due to pain  Skin:    General: Skin is warm and dry.  Neurological:     General: No focal deficit present.     Mental Status: She is alert and oriented to person, place, and time. Mental status is at baseline.      UC Treatments / Results  Labs (all labs ordered are listed, but only abnormal results are displayed) Labs Reviewed - No data to display  EKG   Radiology No results found.  Procedures Procedures (including critical care time)  Medications Ordered in UC Medications - No data to display  Initial Impression / Assessment and Plan / UC Course  I have reviewed the triage vital signs and the nursing notes.  Pertinent labs & imaging results that were available during my care of the patient were reviewed by me and considered in my medical decision making (see chart for details).     MDM: 1.  Acute pain of left shoulder-left shoulder x-ray revealed above, Rx'd Percocet 5/325 mg tablet: Take 1 tablet every 8 hours as needed for severe pain of left shoulder. Advised patient to take medication as directed.  Advised patient pain medication can be sedating so no operation of machinery or driving.  Encouraged to increase daily water intake to 64 ounces per day while taking this medication.  Advised we  will follow-up with x-ray results once received.  Advised if symptoms worsen and/or unresolved please follow-up with PCP or here for further evaluation.  Patient discharged home, hemodynamically stable. Final Clinical Impressions(s) / UC Diagnoses   Final diagnoses:  Acute pain of left shoulder     Discharge Instructions      Advised patient to take medication as directed.  Advised patient pain medication can be sedating so no operation of machinery or driving.  Encouraged to increase daily water intake to 64 ounces per day while taking this medication.  Advised we will follow-up with x-ray results once received.  Advised if symptoms worsen and/or unresolved please follow-up with PCP or here for further evaluation.     ED Prescriptions     Medication Sig Dispense Auth. Provider   oxyCODONE-acetaminophen (PERCOCET/ROXICET) 5-325 MG tablet Take 1 tablet by mouth every 8 (eight) hours as needed for severe pain (pain score 7-10). 21 tablet Trevor Iha, FNP      I have reviewed the PDMP during this encounter.   Trevor Iha,  FNP 08/26/23 1929

## 2023-08-26 NOTE — Addendum Note (Signed)
Addended by: Metta Clines on: 08/26/2023 03:23 PM   Modules accepted: Orders

## 2023-08-26 NOTE — ED Triage Notes (Addendum)
Pt presents to uc with co of left shoulder pain for over 1 month with difficulty moving arm in certain directions.   Pt reports pmh of breast ca diagnosis this year with chemotherapy and current treatment with anastazole. Pt was experiencing an RA flare with this and was taken off of it the last month with steroid treatment.   Pt reports she has seen her rheumatologist 2 weeks ago and was placed on a new medication and extended steroids with no improvement.

## 2023-08-27 ENCOUNTER — Telehealth: Payer: Self-pay

## 2023-08-27 NOTE — Telephone Encounter (Signed)
Per Casimiro Needle, call made to pt to notifiy of neg xray. Take pain meds as directed for severe pain and f/u as needed. Call if any questions or concerns. Pt verbalized understanding.

## 2023-08-29 ENCOUNTER — Telehealth: Payer: Self-pay

## 2023-08-29 NOTE — Telephone Encounter (Addendum)
Called pt with message below. Pt verbalized understanding.----- Message from Noreene Filbert sent at 08/29/2023  8:34 AM EST ----- Please let patient know xray shows improvement in pneumonia. ----- Message ----- From: Interface, Rad Results In Sent: 08/26/2023   7:30 PM EST To: Loa Socks, NP

## 2023-09-12 ENCOUNTER — Ambulatory Visit: Payer: Medicare HMO | Attending: Surgery

## 2023-09-12 VITALS — Wt 256.1 lb

## 2023-09-12 DIAGNOSIS — Z483 Aftercare following surgery for neoplasm: Secondary | ICD-10-CM | POA: Insufficient documentation

## 2023-09-12 NOTE — Therapy (Signed)
 OUTPATIENT PHYSICAL THERAPY SOZO SCREENING NOTE   Patient Name: Pamela Davenport MRN: [id number] DOB:[birth year], 68 y.o., female Today's Date: 09/12/2023  PCP: Joshua Debby CROME, MD REFERRING PROVIDER: Vanderbilt Debby, MD   PT End of Session - 09/12/23 1553     Visit Number 2   # unchanged due to screen only   PT Start Time 1549    PT Stop Time 1605    PT Time Calculation (min) 16 min    Activity Tolerance Patient tolerated treatment well    Behavior During Therapy [facility] for tasks assessed/performed             Past Medical History:  Diagnosis Date   Atypical chest pain    normal myoview  11/11/10   Blood transfusion without reported diagnosis    Cancer (HCC)    DVT (deep venous thrombosis) (HCC)    recurrent; chronically anticoagulated with coumadin    Dysrhythmia    Family history of anesthesia complication    Sister had decrease in respirations after surgery   Heart murmur    Herniated lumbar intervertebral disc    Hypertension    Paroxysmal atrial fibrillation (HCC)    SINCE AGE 81    PE (pulmonary embolism)    chronically anticoagulated with coumadin    Pneumonia 07/20/2012   PONV (postoperative nausea and vomiting)    Rheumatoid arthritis (HCC)    Sickle cell trait (HCC)    Sinus congestion 08/14/2018   C/O NASAL/CHEST  CONGESTION, COUGHING WITH PHLEGM, PER PATIENT , SHE IS TO BEGIN TAKING AUGMENTIN  DOSE PACK TODAY;     Strep throat at age 42   The patient reports a severe strep throat infection at age 84 and   is not clear as to whether or not she may have had rheumatic fever.   Symptomatic premature ventricular contractions    improved s/p ablation   Urgency of urination    Uterine bleeding 2008   Uterine artery embolization in 2008 for uterine bleeding.    Past Surgical History:  Procedure Laterality Date   BREAST BIOPSY Left 11/08/2022   US  LT BREAST BX W LOC DEV EA ADD LESION IMG BX SPEC US  GUIDE 11/08/2022 [facility]   BREAST BIOPSY Left 11/08/2022    US  LT BREAST BX W LOC DEV 1ST LESION IMG BX SPEC US  GUIDE 11/08/2022 [facility]   BREAST BIOPSY Left 12/17/2022   US  LT RADIOACTIVE SEED LOC 12/17/2022 [facility]   BREAST BIOPSY Left 12/17/2022   US  LT RADIOACTIVE SEED EA ADD LESION 12/17/2022 [facility]   BREAST BIOPSY Left 12/17/2022   US  LT RADIOACTIVE SEED EA ADD LESION 12/17/2022 [facility]   BREAST LUMPECTOMY WITH RADIOACTIVE SEED AND SENTINEL LYMPH NODE BIOPSY Left 12/21/2022   Procedure: LEFT BREAST BRACKETED SEED LUMPECTOMY AND LEFT SENTINEL LYMPH NODE MAPPING;  Surgeon: Vanderbilt Debby, MD;  Location: [facility];  Service: General;  Laterality: Left;  GEN & PEC BLOCK   COLONOSCOPY W/ POLYPECTOMY     Diagnostic D&C hysteroscopy and Novasure ablation  03/17/2004   DILATION AND CURETTAGE OF UTERUS  12/17/2011   Procedure: DILATATION AND CURETTAGE;  Surgeon: Aida DELENA Na, MD;  Location: [facility];  Service: Gynecology;  Laterality: N/A;  With Attempted Hydrothermal Ablation   LUMBAR LAMINECTOMY/DECOMPRESSION MICRODISCECTOMY Left 02/21/2013   Procedure: LUMBAR LAMINECTOMY/DECOMPRESSION MICRODISCECTOMY 1 LEVEL;  Surgeon: Rockey CROME Peru, MD;  Location: [facility];  Service: Neurosurgery;  Laterality: Left;  LEFT Lumbar Three-Four Laminotomy foraminotomy microdiskectomy   MANDIBLE SURGERY  under bite   PVC Ablation  2010   ROBOTIC ASSISTED TOTAL HYSTERECTOMY WITH BILATERAL SALPINGO OOPHERECTOMY Bilateral 08/21/2018   Procedure: XI ROBOTIC ASSISTED TOTAL HYSTERECTOMY WITH BILATERAL SALPINGO OOPHORECTOMY;  Surgeon: Eloy Herring, MD;  Location: [facility];  Service: Gynecology;  Laterality: Bilateral;   SPINE SURGERY     TONSILLECTOMY     UTERINE ARTERY EMBOLIZATION  2008   for uterine bleeding   VASCULAR SURGERY     laser of left leg   Patient Active Problem List   Diagnosis Date Noted   Genetic testing 11/30/2022   Family history of prostate cancer 11/19/2022   Malignant  neoplasm of lower-inner quadrant of left breast in female, estrogen receptor positive (HCC) 11/15/2022   Anemia due to acquired thiamine  deficiency 05/07/2022   Deficiency anemia 05/04/2022   Gastroesophageal reflux disease with esophagitis without hemorrhage 05/04/2022   Esophageal dysphagia 05/04/2022   Intermittent palpitations 05/04/2022   OAB (overactive bladder) 12/31/2021   Encounter for general adult medical examination with abnormal findings 08/26/2021   Obesity, morbid, BMI 40.0-49.9 (HCC) 08/26/2021   PAF (paroxysmal atrial fibrillation) (HCC) 08/26/2021   GERD without esophagitis 10/04/2019   Vitamin D  deficiency disease 09/25/2019   Hypercoagulable state (HCC) 09/24/2019   Iron deficiency anemia 12/07/2018   Other dietary vitamin B12 deficiency anemia 12/07/2018   Visit for screening mammogram 04/18/2017   Hyperlipidemia with target LDL less than 130 10/19/2016   Essential hypertension 01/13/2015   Chronic pulmonary embolism (HCC) 12/05/2008    REFERRING DIAG: left breast cancer at risk for lymphedema  THERAPY DIAG: Aftercare following surgery for neoplasm  PERTINENT HISTORY: Patient was diagnosed on 11/09/2022 with left grade 1 invasive ductal carcinoma breast cancer. She underwent a left lumpectomy and sentinel node biopsy on 12/21/2022. It is ER/PR positive and HER2 negative with a Ki67 of 10%.   PRECAUTIONS: left UE Lymphedema risk, None  SUBJECTIVE: Pt returns for her 3 month L-Dex screen.   PAIN:  Are you having pain? No  SOZO SCREENING: Patient was assessed today using the SOZO machine to determine the lymphedema index score. This was compared to her baseline score. It was determined that she is NOT within the recommended range when compared to her baseline and so she was fitted for a compression garment while in the clinic today. It is recommended she return in 1 month to be reassessed. If she continues to measure outside the recommended range, physical therapy  treatment will be recommended at that time and a referral requested.  Suggested pt to begin physical therapy to learn self MLD due to being in the red but she would like to try wearing the compression sleeve x1 month first.    L-DEX FLOWSHEETS - 09/12/23 1500       L-DEX LYMPHEDEMA SCREENING   Measurement Type Unilateral    L-DEX MEASUREMENT EXTREMITY Upper Extremity    POSITION  Standing    DOMINANT SIDE Right    At Risk Side Left    BASELINE SCORE (UNILATERAL) 1.3    L-DEX SCORE (UNILATERAL) 12.8    VALUE CHANGE (UNILAT) 11.5              Aden Berwyn Caldron, PTA 09/12/2023, 4:08 PM    PLEASE KEEP YOUR COMPRESSION GARMENT ON DURING THE DAY TO GET THE BEST SWELLING REDUCTION. HERE ARE SOME ADDITIONAL TIPS: Do not sleep in your garment. If you have pain or notice swelling in your hand or at the top of your shoulder, call your  therapist. This may be a sign that you need a different garment. 3.  Take good care of your garment so it lasts longer: Follow washing instructions on your garment label or box. Wash periodically using a mild detergent in warm water .  Do not use fabric softener or bleach.  Place garment in a mesh lingerie bag and use the gentle cycle of the washing machine or hand wash. Tumble dry low or lay flat to dry. TAKE CARE OF YOUR SKIN Apply a low pH moisturizing lotion to your skin daily Avoid scratching your skin Treat skin irritations quickly  Know the 5 warning signs of infection: redness, pain, warmth to touch, fever and increased swelling.  Call your physician immediately if you notice any of these signs of a possible infection.   Cancer Rehab [phone]

## 2023-09-14 ENCOUNTER — Ambulatory Visit (HOSPITAL_COMMUNITY)
Admission: RE | Admit: 2023-09-14 | Discharge: 2023-09-14 | Disposition: A | Discharge status: Home / Self Care | Payer: Medicare HMO | Source: Ambulatory Visit | Attending: Adult Health | Admitting: Adult Health

## 2023-09-14 DIAGNOSIS — J984 Other disorders of lung: Secondary | ICD-10-CM | POA: Diagnosis not present

## 2023-09-14 DIAGNOSIS — R918 Other nonspecific abnormal finding of lung field: Secondary | ICD-10-CM

## 2023-09-14 DIAGNOSIS — Z170 Estrogen receptor positive status [ER+]: Secondary | ICD-10-CM | POA: Diagnosis not present

## 2023-09-14 DIAGNOSIS — R590 Localized enlarged lymph nodes: Secondary | ICD-10-CM | POA: Diagnosis not present

## 2023-09-14 DIAGNOSIS — I700 Atherosclerosis of aorta: Secondary | ICD-10-CM | POA: Diagnosis not present

## 2023-09-14 DIAGNOSIS — C50312 Malignant neoplasm of lower-inner quadrant of left female breast: Secondary | ICD-10-CM | POA: Diagnosis not present

## 2023-09-14 MED ORDER — IOHEXOL 300 MG/ML  SOLN
75.0000 mL | Freq: Once | INTRAMUSCULAR | Status: AC | PRN
  Administered 2023-09-14: 75 mL via INTRAVENOUS

## 2023-09-19 ENCOUNTER — Encounter: Payer: Self-pay | Admitting: Adult Health

## 2023-09-19 ENCOUNTER — Inpatient Hospital Stay: Payer: Medicare HMO | Attending: Hematology and Oncology | Admitting: Adult Health

## 2023-09-19 ENCOUNTER — Other Ambulatory Visit: Payer: Self-pay | Admitting: *Deleted

## 2023-09-19 ENCOUNTER — Other Ambulatory Visit: Payer: Self-pay | Admitting: Rheumatology

## 2023-09-19 VITALS — BP 133/73 | HR 87 | Temp 98.1°F | Resp 18 | Wt 258.4 lb

## 2023-09-19 DIAGNOSIS — Z807 Family history of other malignant neoplasms of lymphoid, hematopoietic and related tissues: Secondary | ICD-10-CM | POA: Diagnosis not present

## 2023-09-19 DIAGNOSIS — Z1721 Progesterone receptor positive status: Secondary | ICD-10-CM | POA: Insufficient documentation

## 2023-09-19 DIAGNOSIS — J189 Pneumonia, unspecified organism: Secondary | ICD-10-CM

## 2023-09-19 DIAGNOSIS — Z79899 Other long term (current) drug therapy: Secondary | ICD-10-CM

## 2023-09-19 DIAGNOSIS — C50312 Malignant neoplasm of lower-inner quadrant of left female breast: Secondary | ICD-10-CM

## 2023-09-19 DIAGNOSIS — M069 Rheumatoid arthritis, unspecified: Secondary | ICD-10-CM | POA: Insufficient documentation

## 2023-09-19 DIAGNOSIS — Z800 Family history of malignant neoplasm of digestive organs: Secondary | ICD-10-CM | POA: Diagnosis not present

## 2023-09-19 DIAGNOSIS — Z808 Family history of malignant neoplasm of other organs or systems: Secondary | ICD-10-CM | POA: Diagnosis not present

## 2023-09-19 DIAGNOSIS — Z170 Estrogen receptor positive status [ER+]: Secondary | ICD-10-CM

## 2023-09-19 DIAGNOSIS — Z8042 Family history of malignant neoplasm of prostate: Secondary | ICD-10-CM | POA: Diagnosis not present

## 2023-09-19 DIAGNOSIS — Z1732 Human epidermal growth factor receptor 2 negative status: Secondary | ICD-10-CM | POA: Diagnosis not present

## 2023-09-19 DIAGNOSIS — M0579 Rheumatoid arthritis with rheumatoid factor of multiple sites without organ or systems involvement: Secondary | ICD-10-CM

## 2023-09-19 MED ORDER — HYDROXYCHLOROQUINE SULFATE 200 MG PO TABS: 200.0000 mg | ORAL_TABLET | Freq: Two times a day (BID) | ORAL | 0 refills | Status: DC

## 2023-09-19 MED ORDER — EXEMESTANE 25 MG PO TABS: 25.0000 mg | ORAL_TABLET | Freq: Every day | ORAL | 1 refills | Status: DC

## 2023-09-19 MED ORDER — DOXYCYCLINE HYCLATE 100 MG PO TABS: 100.0000 mg | ORAL_TABLET | Freq: Two times a day (BID) | ORAL | 0 refills | Status: DC

## 2023-09-19 NOTE — Telephone Encounter (Signed)
Patient contacted the office to request a medication refill.   1. Name of Medication: Plaquenil  2. How are you currently taking this medication (dosage and times per day)? 1 tablet 2 times a day   3. What pharmacy would you like for that to be sent to? [facility] [address]

## 2023-09-19 NOTE — Assessment & Plan Note (Signed)
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   Pathology counseling: I discussed the final pathology report of the patient provided  a copy of this report. I discussed the margins as well as lymph node surgeries. We also discussed the final staging along with previously performed ER/PR and HER-2/neu testing.   Treatment plan: Oncotype DX score 18 (distant recurrence at 9 years: 16%) Adjuvant radiation therapy 02/18/2023-04/06/2023 Adjuvant antiestrogen therapy with anastrozole 1 mg daily to start 04/05/2023 (unable to tolerate), changed to Letrozole 07/2023 Breast Cancer-difficulty with significant arthralgias on letrozole.  I counseled her to stop the letrozole x 4 weeks and sent in exemestane at 25 mg daily  Pulmonary Infection Repeat CT chest shows near resolution of pneumonia but potential new atypical pneumonia. -Doxycycline twice daily x 10 days -Referral to pulmonology  Rheumatoid Arthritis -Continue follow-up with her rheumatologist. -f/u with Dr. Corliss Skains for labs and evaluation as recommended by her.  Follow-up in 12 weeks with Dr. Pamelia Hoit to review how she is doing on exemestane and follow-up on pulmonology's recommendations.

## 2023-09-19 NOTE — Telephone Encounter (Signed)
Last Fill: 05/12/2023  Eye exam: 01/03/2023 WNL   Labs: 08/22/2023 Hemoglobin is low and stable.  Absolute lymphocyte count is low due to immunosuppression.  Creatinine is mildly elevated and stable.   Next Visit: 11/10/2023  Last Visit: 08/02/2023  ZO:[id number] arthritis with rheumatoid factor of multiple sites without organ or systems involvement   Current Dose per office note 08/02/2023: Plaquenil 200 mg 1 tablet by mouth twice daily.   Okay to refill Plaquenil?

## 2023-09-19 NOTE — Progress Notes (Signed)
[facility] Cancer Follow up:    Pamela Grandchild, MD [address] [place] [address]   DIAGNOSIS:  Cancer Staging  Malignant neoplasm of lower-inner quadrant of left breast in female, estrogen receptor positive (HCC) Staging form: Breast, AJCC 8th Edition - Clinical stage from 11/15/2022: Stage IA (cT1c, cN0(f), cM0, G1, ER+, PR+, HER2-) - Signed by Ronny Bacon, PA-C on 11/15/2022 Stage prefix: Initial diagnosis Method of lymph node assessment: Core biopsy Histologic grading system: 3 grade system - Pathologic: Stage IA (pT1b, pN1(sn), cM0, G2, ER+, PR+, HER2-) - Signed by Serena Croissant, MD on 01/04/2023 Method of lymph node assessment: Sentinel lymph node biopsy Histologic grading system: 3 grade system   SUMMARY OF ONCOLOGIC HISTORY: Oncology History  Malignant neoplasm of lower-inner quadrant of left breast in female, estrogen receptor positive (HCC)  11/08/2022 Surgery   Screening mammogram detected left breast masses at 8 o'clock position: 1.1 cm: Grade 1 IDC ER 95%, PR 80%, HER2 negative 0.7 cm mass: Grade 1 IDC ER 100%, PR 90%, HER2 negative, Ki-67 10%, multiple lymph nodes 1 was biopsied and benign discordant   11/15/2022 Initial Diagnosis   Primary malignant neoplasm of lower outer quadrant of female breast, left (HCC)   11/15/2022 Cancer Staging   Staging form: Breast, AJCC 8th Edition - Clinical stage from 11/15/2022: Stage IA (cT1c, cN0(f), cM0, G1, ER+, PR+, HER2-) - Signed by Ronny Bacon, PA-C on 11/15/2022 Stage prefix: Initial diagnosis Method of lymph node assessment: Core biopsy Histologic grading system: 3 grade system    Genetic Testing   [facility] Custom Cancer Panel+RNA was Negative. Report date is 11/29/2022.  The Custom Hereditary Cancers Panel offered by [facility] includes sequencing and/or deletion duplication testing of the following 43 genes: APC, ATM, AXIN2, BAP1, BARD1, BMPR1A, BRCA1, BRCA2, BRIP1, CDH1, CDK4, CDKN2A  (p14ARF and p16INK4a only), CHEK2, CTNNA1, EPCAM (Deletion/duplication testing only), FH, GREM1 (promoter region duplication testing only), HOXB13, KIT, MBD4, MEN1, MLH1, MSH2, MSH3, MSH6, MUTYH, NF1, NHTL1, PALB2, PDGFRA, PMS2, POLD1, POLE, PTEN, RAD51C, RAD51D, SMAD4, SMARCA4. STK11, TP53, TSC1, TSC2, and VHL.   12/21/2022 Surgery   Left lumpectomy: 2 foci IDC 1 with lobular features 1 cm and 0.9 cm grade 1, grade 2 DCIS, LCIS, margins negative, LVI present, ER 95%, PR 80%, HER2 2+ by IHC, FISH negative, Ki-67 10% 1/4 lymph nodes positive, another lymph node with micrometastasis   12/21/2022 Oncotype testing   18/16%   01/04/2023 Cancer Staging   Staging form: Breast, AJCC 8th Edition - Pathologic: Stage IA (pT1b, pN1(sn), cM0, G2, ER+, PR+, HER2-) - Signed by Serena Croissant, MD on 01/04/2023 Method of lymph node assessment: Sentinel lymph node biopsy Histologic grading system: 3 grade system   02/17/2023 - 04/06/2023 Radiation Therapy   Plan Name: Breast_L_BH Site: Breast, Left Technique: 3D Mode: Photon Dose Per Fraction: 1.8 Gy Prescribed Dose (Delivered / Prescribed): 18 Gy / 18 Gy Prescribed Fxs (Delivered / Prescribed): 10 / 10   Plan Name: Breast_L_BH:1 Site: Breast, Left Technique: 3D Mode: Photon Dose Per Fraction: 1.8 Gy Prescribed Dose (Delivered / Prescribed): 32.4 Gy / 32.4 Gy Prescribed Fxs (Delivered / Prescribed): 18 / 18   Plan Name: Brst_L_Scv_BH Site: Sclav-LT Technique: 3D Mode: Photon Dose Per Fraction: 1.8 Gy Prescribed Dose (Delivered / Prescribed): 50.4 Gy / 50.4 Gy Prescribed Fxs (Delivered / Prescribed): 28 / 28   Plan Name: Brst_L_Bst_BH Site: Breast, Left Technique: 3D Mode: Photon Dose Per Fraction: 2 Gy Prescribed Dose (Delivered / Prescribed): 10  Gy / 10 Gy Prescribed Fxs (Delivered / Prescribed): 5 / 5     03/2023 -  Anti-estrogen oral therapy   Anastrozole x 7 years     CURRENT THERAPY: Letrozole  INTERVAL HISTORY:   Discussed the use  of AI scribe software for clinical note transcription with the patient, who gave verbal consent to proceed.  Pamela Davenport 68 y.o. female returns for follow-up after undergoing repeat CT chest on September 14, 2023 to follow-up on previous left upper lobe opacity that was treated as pneumonia.  CT chest was read on September 19, 2023 and demonstrated near complete resolution of left upper lobe pneumonia however demonstrated new areas of rounded, centrally lucent, groundglass in the lungs bilaterally possibly due to an atypical infectious process or organizing pneumonia.  She had resolved mediastinal and hilar lymphadenopathy, no evidence of metastatic disease, and aortic atherosclerosis was also noted.  She notes that she has been experiencing increased arthralgias.  Most recently in the left shoulder however the pain does seem to go from joint to joint.  She has increasing fatigue.  She also has history of rheumatoid arthritis and notes that it was previously controlled on hydroxychloroquine however since her breast cancer diagnosis she is struggled with this.   Patient Active Problem List   Diagnosis Date Noted   Genetic testing 11/30/2022   Family history of prostate cancer 11/19/2022   Malignant neoplasm of lower-inner quadrant of left breast in female, estrogen receptor positive (HCC) 11/15/2022   Anemia due to acquired thiamine deficiency 05/07/2022   Deficiency anemia 05/04/2022   Gastroesophageal reflux disease with esophagitis without hemorrhage 05/04/2022   Esophageal dysphagia 05/04/2022   Intermittent palpitations 05/04/2022   OAB (overactive bladder) 12/31/2021   Encounter for general adult medical examination with abnormal findings 08/26/2021   Obesity, morbid, BMI 40.0-49.9 (HCC) 08/26/2021   PAF (paroxysmal atrial fibrillation) (HCC) 08/26/2021   GERD without esophagitis 10/04/2019   Vitamin D deficiency disease 09/25/2019   Hypercoagulable state (HCC) 09/24/2019   Iron  deficiency anemia 12/07/2018   Other dietary vitamin B12 deficiency anemia 12/07/2018   Visit for screening mammogram 04/18/2017   Hyperlipidemia with target LDL less than 130 10/19/2016   Essential hypertension 01/13/2015   Chronic pulmonary embolism (HCC) 12/05/2008    is allergic to methylprednisolone, sulfa antibiotics, sulfonamide derivatives, and macrobid [nitrofurantoin].  MEDICAL HISTORY: Past Medical History:  Diagnosis Date   Atypical chest pain    normal myoview 11/11/10   Blood transfusion without reported diagnosis    Cancer (HCC)    DVT (deep venous thrombosis) (HCC)    recurrent; chronically anticoagulated with coumadin   Dysrhythmia    Family history of anesthesia complication    Sister had decrease in respirations after surgery   Heart murmur    Herniated lumbar intervertebral disc    Hypertension    Paroxysmal atrial fibrillation (HCC)    SINCE AGE 14    PE (pulmonary embolism)    chronically anticoagulated with coumadin   Pneumonia 07/20/2012   PONV (postoperative nausea and vomiting)    Rheumatoid arthritis (HCC)    Sickle cell trait (HCC)    Sinus congestion 08/14/2018   C/O NASAL/CHEST  CONGESTION, COUGHING WITH PHLEGM, PER PATIENT , SHE IS TO BEGIN TAKING AUGMENTIN DOSE PACK TODAY;     Strep throat at age 41   The patient reports a severe strep throat infection at age 8 and   is not clear as to whether or not she may have had  rheumatic fever.   Symptomatic premature ventricular contractions    improved s/p ablation   Urgency of urination    Uterine bleeding 2008   Uterine artery embolization in 2008 for uterine bleeding.     SURGICAL HISTORY: Past Surgical History:  Procedure Laterality Date   BREAST BIOPSY Left 11/08/2022   [place] LT BREAST BX W LOC DEV EA ADD LESION IMG BX SPEC US GUIDE 11/08/2022 [facility]   BREAST BIOPSY Left 11/08/2022   [place] LT BREAST BX W LOC DEV 1ST LESION IMG BX SPEC US GUIDE 11/08/2022 [facility]   BREAST  BIOPSY Left 12/17/2022   [place] LT RADIOACTIVE SEED LOC 12/17/2022 [facility]   BREAST BIOPSY Left 12/17/2022   [place] LT RADIOACTIVE SEED EA ADD LESION 12/17/2022 [facility]   BREAST BIOPSY Left 12/17/2022   [place] LT RADIOACTIVE SEED EA ADD LESION 12/17/2022 [facility]   BREAST LUMPECTOMY WITH RADIOACTIVE SEED AND SENTINEL LYMPH NODE BIOPSY Left 12/21/2022   Procedure: LEFT BREAST BRACKETED SEED LUMPECTOMY AND LEFT SENTINEL LYMPH NODE MAPPING;  Surgeon: Harriette Bouillon, MD;  Location: [facility];  Service: General;  Laterality: Left;  GEN & PEC BLOCK   COLONOSCOPY W/ POLYPECTOMY     Diagnostic D&C hysteroscopy and Novasure ablation  03/17/2004   DILATION AND CURETTAGE OF UTERUS  12/17/2011   Procedure: DILATATION AND CURETTAGE;  Surgeon: Kathreen Cosier, MD;  Location: [facility];  Service: Gynecology;  Laterality: N/A;  With Attempted Hydrothermal Ablation   LUMBAR LAMINECTOMY/DECOMPRESSION MICRODISCECTOMY Left 02/21/2013   Procedure: LUMBAR LAMINECTOMY/DECOMPRESSION MICRODISCECTOMY 1 LEVEL;  Surgeon: Carmela Hurt, MD;  Location: [facility];  Service: Neurosurgery;  Laterality: Left;  LEFT Lumbar Three-Four Laminotomy foraminotomy microdiskectomy   MANDIBLE SURGERY     under bite   PVC Ablation  2010   ROBOTIC ASSISTED TOTAL HYSTERECTOMY WITH BILATERAL SALPINGO OOPHERECTOMY Bilateral 08/21/2018   Procedure: XI ROBOTIC ASSISTED TOTAL HYSTERECTOMY WITH BILATERAL SALPINGO OOPHORECTOMY;  Surgeon: Adolphus Birchwood, MD;  Location: [facility];  Service: Gynecology;  Laterality: Bilateral;   SPINE SURGERY     TONSILLECTOMY     UTERINE ARTERY EMBOLIZATION  2008   for uterine bleeding   VASCULAR SURGERY     laser of left leg    SOCIAL HISTORY: Social History   Socioeconomic History   Marital status: Single    Spouse name: Not on file   Number of children: Not on file   Years of education: Not on file   Highest education level: Not on file  Occupational  History   Occupation: Nurse  Tobacco Use   Smoking status: Former    Current packs/day: 0.00    Average packs/day: 1 pack/day for 2.0 years (2.0 ttl pk-yrs)    Types: Cigarettes    Start date: 02/12/1977    Quit date: 02/13/1979    Years since quitting: 44.6    Passive exposure: Never   Smokeless tobacco: Never  Vaping Use   Vaping status: Never Used  Substance and Sexual Activity   Alcohol use: Yes    Comment: rarely   Drug use: No   Sexual activity: Not on file  Other Topics Concern   Not on file  Social History Narrative   Works as a Engineer, civil (consulting) at [employer]   Social Drivers of Health   Financial Resource Strain: Not on file  Food Insecurity: No Food Insecurity (01/19/2023)   Hunger Vital Sign    Worried About Running Out of Food in the Last  Year: Never true    Ran Out of Food in the Last Year: Never true  Transportation Needs: No Transportation Needs (01/19/2023)   PRAPARE - Administrator, Civil Service (Medical): No    Lack of Transportation (Non-Medical): No  Physical Activity: Not on file  Stress: Not on file  Social Connections: Not on file  Intimate Partner Violence: Not At Risk (01/19/2023)   Humiliation, Afraid, Rape, and Kick questionnaire    Fear of Current or Ex-Partner: No    Emotionally Abused: No    Physically Abused: No    Sexually Abused: No    FAMILY HISTORY: Family History  Problem Relation Age of Onset   Hypertension Mother    Diabetes Mother    Uterine cancer Sister 31   Prostate cancer Brother 17   Prostate cancer Brother 82   Prostate cancer Brother    Prostate cancer Maternal Uncle    Prostate cancer Maternal Uncle    Non-Hodgkin's lymphoma Paternal Aunt 52   Pancreatic cancer Paternal Aunt 71   Colon cancer Paternal Grandmother 60   Healthy Son    Breast cancer Neg Hx     Review of Systems  Constitutional:  Positive for fatigue. Negative for appetite change, chills, fever and unexpected weight change.  HENT:    Negative for hearing loss, lump/mass and trouble swallowing.   Eyes:  Negative for eye problems and icterus.  Respiratory:  Negative for chest tightness, cough and shortness of breath.   Cardiovascular:  Negative for chest pain, leg swelling and palpitations.  Gastrointestinal:  Negative for abdominal distention, abdominal pain, constipation, diarrhea, nausea and vomiting.  Endocrine: Negative for hot flashes.  Genitourinary:  Negative for difficulty urinating.   Musculoskeletal:  Positive for arthralgias.  Skin:  Negative for itching and rash.  Neurological:  Negative for dizziness, extremity weakness, headaches and numbness.  Hematological:  Negative for adenopathy. Does not bruise/bleed easily.  Psychiatric/Behavioral:  Negative for depression. The patient is not nervous/anxious.       PHYSICAL EXAMINATION    Vitals:   09/19/23 1051  BP: 133/73  Pulse: 87  Resp: 18  Temp: 98.1 F (36.7 C)  SpO2: 99%    Physical Exam Constitutional:      General: She is not in acute distress.    Appearance: Normal appearance. She is not toxic-appearing.  HENT:     Head: Normocephalic and atraumatic.     Mouth/Throat:     Mouth: Mucous membranes are moist.     Pharynx: Oropharynx is clear. No oropharyngeal exudate or posterior oropharyngeal erythema.  Eyes:     General: No scleral icterus. Cardiovascular:     Rate and Rhythm: Normal rate and regular rhythm.     Pulses: Normal pulses.     Heart sounds: Normal heart sounds.  Pulmonary:     Effort: Pulmonary effort is normal.     Breath sounds: Normal breath sounds.  Abdominal:     General: Abdomen is flat. Bowel sounds are normal. There is no distension.     Palpations: Abdomen is soft.     Tenderness: There is no abdominal tenderness.  Musculoskeletal:        General: No swelling.     Cervical back: Neck supple.  Lymphadenopathy:     Cervical: No cervical adenopathy.  Skin:    General: Skin is warm and dry.     Findings: No  rash.  Neurological:     General: No focal deficit present.  Mental Status: She is alert.  Psychiatric:        Mood and Affect: Mood normal.        Behavior: Behavior normal.        ASSESSMENT and THERAPY PLAN:   Malignant neoplasm of lower-inner quadrant of left breast in female, estrogen receptor positive (HCC) 12/21/2022:Left lumpectomy: 2 foci of grade 1IDC one with lobular features 1 cm and 0.9 cm , int grade DCIS, LCIS, margins negative, LVI present, ER 95%, PR 80%, HER2 2+ by IHC, FISH negative, Ki-67 10% 1/4 lymph nodes positive for macrometastases and 1 additional lymph node positive for micrometastases.   Pathology counseling: I discussed the final pathology report of the patient provided  a copy of this report. I discussed the margins as well as lymph node surgeries. We also discussed the final staging along with previously performed ER/PR and HER-2/neu testing.   Treatment plan: Oncotype DX score 18 (distant recurrence at 9 years: 16%) Adjuvant radiation therapy 02/18/2023-04/06/2023 Adjuvant antiestrogen therapy with anastrozole 1 mg daily to start 04/05/2023 (unable to tolerate), changed to Letrozole 07/2023 Breast Cancer-difficulty with significant arthralgias on letrozole.  I counseled her to stop the letrozole x 4 weeks and sent in exemestane at 25 mg daily  Pulmonary Infection Repeat CT chest shows near resolution of pneumonia but potential new atypical pneumonia. -Doxycycline twice daily x 10 days -Referral to pulmonology  Rheumatoid Arthritis -Continue follow-up with her rheumatologist. -f/u with Dr. Corliss Skains for labs and evaluation as recommended by her.  Follow-up in 12 weeks with Dr. Pamelia Hoit to review how she is doing on exemestane and follow-up on pulmonology's recommendations.   All questions were answered. The patient knows to call the clinic with any problems, questions or concerns. We can certainly see the patient much sooner if necessary.  Total  encounter time:30 minutes*in face-to-face visit time, chart review, lab review, care coordination, order entry, and documentation of the encounter time.    Lillard Anes, NP 09/19/23 11:16 AM [facility] [address] [place], [place] [address] Tel. [phone]    Fax. [phone]  *Total Encounter Time as defined by the Centers for Medicare and Medicaid Services includes, in addition to the face-to-face time of a patient visit (documented in the note above) non-face-to-face time: obtaining and reviewing outside history, ordering and reviewing medications, tests or procedures, care coordination (communications with other health care professionals or caregivers) and documentation in the medical record.

## 2023-09-20 LAB — COMPLETE METABOLIC PANEL WITH GFR
AG Ratio: 1.2 (calc) (ref 1.0–2.5)
ALT: 11 U/L (ref 6–29)
AST: 19 U/L (ref 10–35)
Albumin: 3.9 g/dL (ref 3.6–5.1)
Alkaline phosphatase (APISO): 92 U/L (ref 37–153)
BUN/Creatinine Ratio: 12 (calc) (ref 6–22)
BUN: 13 mg/dL (ref 7–25)
CO2: 23 mmol/L (ref 20–32)
Calcium: 9.8 mg/dL (ref 8.6–10.4)
Chloride: 108 mmol/L (ref 98–110)
Creat: 1.1 mg/dL — ABNORMAL HIGH (ref 0.50–1.05)
Globulin: 3.2 g/dL (ref 1.9–3.7)
Glucose, Bld: 90 mg/dL (ref 65–99)
Potassium: 4.2 mmol/L (ref 3.5–5.3)
Sodium: 140 mmol/L (ref 135–146)
Total Bilirubin: 0.3 mg/dL (ref 0.2–1.2)
Total Protein: 7.1 g/dL (ref 6.1–8.1)
eGFR: 55 mL/min/{1.73_m2} — ABNORMAL LOW (ref >=60)

## 2023-09-20 LAB — CBC WITH DIFFERENTIAL/PLATELET
Absolute Lymphocytes: 506 {cells}/uL — ABNORMAL LOW (ref 850–3900)
Absolute Monocytes: 422 {cells}/uL (ref 200–950)
Basophils Absolute: 38 {cells}/uL (ref 0–200)
Basophils Relative: 0.6 %
Eosinophils Absolute: 154 {cells}/uL (ref 15–500)
Eosinophils Relative: 2.4 %
HCT: 36 % (ref 35.0–45.0)
Hemoglobin: 11.6 g/dL — ABNORMAL LOW (ref 11.7–15.5)
MCH: 26.3 pg — ABNORMAL LOW (ref 27.0–33.0)
MCHC: 32.2 g/dL (ref 32.0–36.0)
MCV: 81.6 fL (ref 80.0–100.0)
MPV: 10.4 fL (ref 7.5–12.5)
Monocytes Relative: 6.6 %
Neutro Abs: 5280 {cells}/uL (ref 1500–7800)
Neutrophils Relative %: 82.5 %
Platelets: 283 10*3/uL (ref 140–400)
RBC: 4.41 10*6/uL (ref 3.80–5.10)
RDW: 14.4 % (ref 11.0–15.0)
Total Lymphocyte: 7.9 %
WBC: 6.4 10*3/uL (ref 3.8–10.8)

## 2023-09-20 NOTE — Progress Notes (Signed)
Creatinine remains elevated but has improved--1.10--continues to trend down and GFR is low-55-improved from 53.  Rest of CMP WNL   Hemoglobin remains low but has improved-11.6.   Absolute lymphocytes are low but have improved. We will continue to monitor lab work closely

## 2023-09-27 DIAGNOSIS — C50912 Malignant neoplasm of unspecified site of left female breast: Secondary | ICD-10-CM | POA: Diagnosis not present

## 2023-10-03 ENCOUNTER — Other Ambulatory Visit: Payer: Self-pay | Admitting: *Deleted

## 2023-10-03 MED ORDER — LEFLUNOMIDE 20 MG PO TABS: 20.0000 mg | ORAL_TABLET | Freq: Every day | ORAL | 0 refills | Status: DC

## 2023-10-03 NOTE — Telephone Encounter (Signed)
Last Fill: 08/26/2023 # 30  Labs: 09/19/2023  Creatinine remains elevated but has improved--1.10--continues to trend down and GFR is low-55-improved from 53.  Rest of CMP WNL   Hemoglobin remains low but has improved-11.6.   Absolute lymphocytes are low but have improved. We will continue to monitor lab work closely  Next Visit: 11/10/2023  Last Visit: 08/02/2023  DX: Rheumatoid arthritis with rheumatoid factor of multiple sites without organ or systems involvement   Current Dose per office note 08/02/2023: Arava use, and consent will be uploaded into the media tab.   Patient dose will be 10 mg p.o. daily for 2 weeks and if labs are normal we will increase it to 20 mg p.o. daily.  Okay to refill Arava ?

## 2023-10-04 ENCOUNTER — Telehealth: Payer: Self-pay

## 2023-10-04 NOTE — Telephone Encounter (Signed)
 Patient contacted the office in regards to her leflunomide  refill. Advised the patient a refill of leflunomide  was sent on 10/03/2023 to [facility] on [address] with 90 tablets. Patient verbalized understanding. Patient states she will contact her pharmacy.

## 2023-10-18 ENCOUNTER — Ambulatory Visit: Payer: Medicare HMO | Attending: Rheumatology | Admitting: Rheumatology

## 2023-10-18 ENCOUNTER — Ambulatory Visit: Payer: Medicare HMO

## 2023-10-18 DIAGNOSIS — M65331 Trigger finger, right middle finger: Secondary | ICD-10-CM | POA: Diagnosis not present

## 2023-10-18 MED ORDER — LIDOCAINE HCL 1 % IJ SOLN
0.5000 mL | INTRAMUSCULAR | Status: AC | PRN
  Administered 2023-10-18: .5 mL

## 2023-10-18 MED ORDER — TRIAMCINOLONE ACETONIDE 40 MG/ML IJ SUSP
20.0000 mg | INTRAMUSCULAR | Status: AC | PRN
  Administered 2023-10-18: 20 mg

## 2023-10-18 NOTE — Progress Notes (Signed)
   Procedure Note  Patient: Pamela Davenport             Date of Birth: [birth year]           MRN: [id number]             Visit Date: 10/18/2023  Procedures: Visit Diagnoses:  1. Trigger middle finger of right hand    Ultrasound guided injection is preferred based studies that show increased duration, increased effect, greater accuracy, decreased procedural pain, increased response rate, and decreased cost with ultrasound guided versus blind injection.   Verbal informed consent obtained.  Time-out conducted.  Noted no overlying erythema, induration, or other signs of local infection. Ultrasound-guided trigger finger injection: After sterile prep with Betadine, injected 0.5 mL of 1% lidocaine and 20 mg Kenalog using a 27-gauge needle needle, in the flexor tendon sheath.    Hand/UE Inj: R long A1 for trigger finger on 10/18/2023 8:05 AM Indications: pain, tendon swelling and therapeutic Details: 27 G needle, ultrasound-guided volar approach Medications: 0.5 mL lidocaine 1 %; 20 mg triamcinolone acetonide 40 MG/ML Aspirate: 0 mL Consent was given by the patient. Immediately prior to procedure a time out was called to verify the correct patient, procedure, equipment, support staff and site/side marked as required. Patient was prepped and draped in the usual sterile fashion.     Patient tolerated the procedure well.  Postprocedure instructions were given.  Patient has a finger splint that she will use at home.  Pollyann Savoy, MD

## 2023-10-20 ENCOUNTER — Ambulatory Visit: Payer: Medicare HMO | Attending: Surgery | Admitting: Physical Therapy

## 2023-10-20 ENCOUNTER — Encounter: Payer: Self-pay | Admitting: Physical Therapy

## 2023-10-20 DIAGNOSIS — Z483 Aftercare following surgery for neoplasm: Secondary | ICD-10-CM | POA: Insufficient documentation

## 2023-10-20 NOTE — Therapy (Signed)
OUTPATIENT PHYSICAL THERAPY SOZO SCREENING NOTE   Patient Name: Pamela Davenport MRN: [id number] DOB:June 07, 1956, 68 y.o., female Today's Date: 10/20/2023  PCP: [name], MD REFERRING PROVIDER: Harriette Bouillon, MD   PT End of Session - 10/20/23 1034     Visit Number 2    PT Start Time 1018    PT Stop Time 1024    PT Time Calculation (min) 6 min    Activity Tolerance Patient tolerated treatment well    Behavior During Therapy [facility] for tasks assessed/performed             Past Medical History:  Diagnosis Date   Atypical chest pain    normal myoview 11/11/10   Blood transfusion without reported diagnosis    Cancer (HCC)    DVT (deep venous thrombosis) (HCC)    recurrent; chronically anticoagulated with coumadin   Dysrhythmia    Family history of anesthesia complication    Sister had decrease in respirations after surgery   Heart murmur    Herniated lumbar intervertebral disc    Hypertension    Paroxysmal atrial fibrillation (HCC)    SINCE AGE 52    PE (pulmonary embolism)    chronically anticoagulated with coumadin   Pneumonia 07/20/2012   PONV (postoperative nausea and vomiting)    Rheumatoid arthritis (HCC)    Sickle cell trait (HCC)    Sinus congestion 08/14/2018   C/O NASAL/CHEST  CONGESTION, COUGHING WITH PHLEGM, PER PATIENT , SHE IS TO BEGIN TAKING AUGMENTIN DOSE PACK TODAY;     Strep throat at age 33   The patient reports a severe strep throat infection at age 67 and   is not clear as to whether or not she may have had rheumatic fever.   Symptomatic premature ventricular contractions    improved s/p ablation   Urgency of urination    Uterine bleeding 2008   Uterine artery embolization in 2008 for uterine bleeding.    Past Surgical History:  Procedure Laterality Date   BREAST BIOPSY Left 11/08/2022   [place] LT BREAST BX W LOC DEV EA ADD LESION IMG BX SPEC US GUIDE 11/08/2022 [facility]   BREAST BIOPSY Left 11/08/2022   [place] LT BREAST BX W LOC DEV 1ST  LESION IMG BX SPEC US GUIDE 11/08/2022 [facility]   BREAST BIOPSY Left 12/17/2022   [place] LT RADIOACTIVE SEED LOC 12/17/2022 [facility]   BREAST BIOPSY Left 12/17/2022   [place] LT RADIOACTIVE SEED EA ADD LESION 12/17/2022 [facility]MO[facility]   BREAST BIOPSY Left 12/17/2022   [place] LT RADIOACTIVE SEED EA ADD LESION 12/17/2022 [facility]AMMO[facility]   BREAST LUMPECTOMY WITH RADIOACTIVE SEED AND SENTINEL LYMPH NODE BIOPSY Left 12/21/2022   Procedure: LEFT BREAST BRACKETED SEED LUMPECTOMY AND LEFT SENTINEL LYMPH NODE MAPPING;  Surgeon: Harriette Bouillon, MD;  Location: [facility];  Service: General;  Laterality: Left;  GEN & PEC BLOCK   COLONOSCOPY W/ POLYPECTOMY     Diagnostic D&C hysteroscopy and Novasure ablation  03/17/2004   DILATION AND CURETTAGE OF UTERUS  12/17/2011   Procedure: DILATATION AND CURETTAGE;  Surgeon: Kathreen Cosier, MD;  Location: [facility];  Service: Gynecology;  Laterality: N/A;  With Attempted Hydrothermal Ablation   LUMBAR LAMINECTOMY/DECOMPRESSION MICRODISCECTOMY Left 02/21/2013   Procedure: LUMBAR LAMINECTOMY/DECOMPRESSION MICRODISCECTOMY 1 LEVEL;  Surgeon: Carmela Hurt, MD;  Location: [facility];  Service: Neurosurgery;  Laterality: Left;  LEFT Lumbar Three-Four Laminotomy foraminotomy microdiskectomy   MANDIBLE SURGERY     under bite  PVC Ablation  2010   ROBOTIC ASSISTED TOTAL HYSTERECTOMY WITH BILATERAL SALPINGO OOPHERECTOMY Bilateral 08/21/2018   Procedure: XI ROBOTIC ASSISTED TOTAL HYSTERECTOMY WITH BILATERAL SALPINGO OOPHORECTOMY;  Surgeon: Adolphus Birchwood, MD;  Location: [facility];  Service: Gynecology;  Laterality: Bilateral;   SPINE SURGERY     TONSILLECTOMY     UTERINE ARTERY EMBOLIZATION  2008   for uterine bleeding   VASCULAR SURGERY     laser of left leg   Patient Active Problem List   Diagnosis Date Noted   Genetic testing 11/30/2022   Family history of prostate cancer 11/19/2022   Malignant neoplasm of lower-inner quadrant  of left breast in female, estrogen receptor positive (HCC) 11/15/2022   Anemia due to acquired thiamine deficiency 05/07/2022   Deficiency anemia 05/04/2022   Gastroesophageal reflux disease with esophagitis without hemorrhage 05/04/2022   Esophageal dysphagia 05/04/2022   Intermittent palpitations 05/04/2022   OAB (overactive bladder) 12/31/2021   Encounter for general adult medical examination with abnormal findings 08/26/2021   Obesity, morbid, BMI 40.0-49.9 (HCC) 08/26/2021   PAF (paroxysmal atrial fibrillation) (HCC) 08/26/2021   GERD without esophagitis 10/04/2019   Vitamin D deficiency disease 09/25/2019   Hypercoagulable state (HCC) 09/24/2019   Iron deficiency anemia 12/07/2018   Other dietary vitamin B12 deficiency anemia 12/07/2018   Visit for screening mammogram 04/18/2017   Hyperlipidemia with target LDL less than 130 10/19/2016   Essential hypertension 01/13/2015   Chronic pulmonary embolism (HCC) 12/05/2008    REFERRING DIAG: left breast cancer at risk for lymphedema  THERAPY DIAG:  Aftercare following surgery for neoplasm  PERTINENT HISTORY: Patient was diagnosed on 11/09/2022 with left grade 1 invasive ductal carcinoma breast cancer. She underwent a left lumpectomy and sentinel node biopsy on 12/21/2022. It is ER/PR positive and HER2 negative with a Ki67 of 10%.   PRECAUTIONS: left UE Lymphedema risk  SUBJECTIVE: Pt here for SOZO screen  PAIN:  Are you having pain? No  SOZO SCREENING: Patient was assessed today using the SOZO machine to determine the lymphedema index score. This was compared to her baseline score. It was determined that she is within the recommended range when compared to her baseline and no further action is needed at this time. She will continue SOZO screenings. These are done every 3 months for 2 years post operatively followed by every 6 months for 2 years, and then annually.   L-DEX FLOWSHEETS - 10/20/23 1000       L-DEX LYMPHEDEMA  SCREENING   Measurement Type Unilateral    L-DEX MEASUREMENT EXTREMITY Upper Extremity    POSITION  Standing    DOMINANT SIDE Right    At Risk Side Left    BASELINE SCORE (UNILATERAL) 1.3    L-DEX SCORE (UNILATERAL) -5.2    VALUE CHANGE (UNILAT) -6.5            Bethann Punches, [place] 10/20/23 10:54 AM

## 2023-10-27 ENCOUNTER — Ambulatory Visit
Admission: RE | Admit: 2023-10-27 | Discharge: 2023-10-27 | Disposition: A | Discharge status: Home / Self Care | Payer: Medicare HMO | Source: Ambulatory Visit | Attending: Adult Health | Admitting: Adult Health

## 2023-10-27 DIAGNOSIS — C50312 Malignant neoplasm of lower-inner quadrant of left female breast: Secondary | ICD-10-CM | POA: Diagnosis not present

## 2023-10-27 DIAGNOSIS — Z170 Estrogen receptor positive status [ER+]: Secondary | ICD-10-CM | POA: Diagnosis not present

## 2023-10-27 DIAGNOSIS — Z853 Personal history of malignant neoplasm of breast: Secondary | ICD-10-CM | POA: Diagnosis not present

## 2023-10-28 NOTE — Progress Notes (Signed)
 Office Visit Note  Patient: Pamela Davenport             Date of Birth: 1956-01-11           MRN: [id number]             PCP: [name], MD Referring: [name], MD Visit Date: 11/10/2023 Occupation: @GUAROCC @  Subjective:  Intermittent pain in hands  History of Present Illness: Pamela Davenport is a 68 y.o. female with seropositive rheumatoid arthritis.  She states she has intermittent discomfort in her hands which is doing certain activities.  She has not noticed any joint swelling.  She has been taking leflunomide 20 mg daily along with hydroxychloroquine 200 mg p.o. twice daily.  She has noticed improvement in her symptoms since she has been taking leflunomide.  She denies any discomfort in her shoulders.  She states that the right middle finger occasionally triggers send rest of the fingers are better.  She denies any discomfort in the trochanteric region today.  She has intermittent discomfort in her knees and her feet.  She has not noticed any swelling.    Activities of Daily Living:  Patient reports morning stiffness for 1  hour.   Patient Denies nocturnal pain.  Difficulty dressing/grooming: Denies Difficulty climbing stairs: Reports Difficulty getting out of chair: Reports Difficulty using hands for taps, buttons, cutlery, and/or writing: Denies  Review of Systems  Constitutional:  Negative for fatigue.  HENT:  Positive for mouth dryness.   Eyes:  Negative for dryness.  Respiratory:  Negative for shortness of breath.   Cardiovascular:  Negative for chest pain and palpitations.  Gastrointestinal:  Positive for constipation. Negative for blood in stool and diarrhea.  Endocrine: Negative for increased urination.  Genitourinary:  Negative for decreased urine output.  Musculoskeletal:  Positive for joint pain, joint pain and morning stiffness. Negative for gait problem, joint swelling, myalgias and myalgias.  Skin:  Negative for color change, rash and sensitivity  to sunlight.  Allergic/Immunologic: Negative for susceptible to infections.  Neurological:  Negative for fainting and headaches.  Hematological:  Negative for swollen glands.  Psychiatric/Behavioral:  Positive for sleep disturbance. Negative for depressed mood. The patient is not nervous/anxious.     PMFS History:  Patient Active Problem List   Diagnosis Date Noted   Genetic testing 11/30/2022   Family history of prostate cancer 11/19/2022   Malignant neoplasm of lower-inner quadrant of left breast in female, estrogen receptor positive (HCC) 11/15/2022   Anemia due to acquired thiamine deficiency 05/07/2022   Deficiency anemia 05/04/2022   Gastroesophageal reflux disease with esophagitis without hemorrhage 05/04/2022   Esophageal dysphagia 05/04/2022   Intermittent palpitations 05/04/2022   OAB (overactive bladder) 12/31/2021   Encounter for general adult medical examination with abnormal findings 08/26/2021   Obesity, morbid, BMI 40.0-49.9 (HCC) 08/26/2021   PAF (paroxysmal atrial fibrillation) (HCC) 08/26/2021   GERD without esophagitis 10/04/2019   Vitamin D deficiency disease 09/25/2019   Hypercoagulable state (HCC) 09/24/2019   Iron deficiency anemia 12/07/2018   Other dietary vitamin B12 deficiency anemia 12/07/2018   Visit for screening mammogram 04/18/2017   Hyperlipidemia with target LDL less than 130 10/19/2016   Essential hypertension 01/13/2015   Chronic pulmonary embolism (HCC) 12/05/2008    Past Medical History:  Diagnosis Date   Atypical chest pain    normal myoview 11/11/10   Blood transfusion without reported diagnosis    Cancer (HCC)    DVT (deep venous thrombosis) (HCC)  recurrent; chronically anticoagulated with coumadin   Dysrhythmia    Family history of anesthesia complication    Sister had decrease in respirations after surgery   Heart murmur    Herniated lumbar intervertebral disc    Hypertension    Paroxysmal atrial fibrillation (HCC)    SINCE  AGE 75    PE (pulmonary embolism)    chronically anticoagulated with coumadin   Pneumonia 07/20/2012   PONV (postoperative nausea and vomiting)    Rheumatoid arthritis (HCC)    Sickle cell trait (HCC)    Sinus congestion 08/14/2018   C/O NASAL/CHEST  CONGESTION, COUGHING WITH PHLEGM, PER PATIENT , SHE IS TO BEGIN TAKING AUGMENTIN DOSE PACK TODAY;     Strep throat at age 23   The patient reports a severe strep throat infection at age 36 and   is not clear as to whether or not she may have had rheumatic fever.   Symptomatic premature ventricular contractions    improved s/p ablation   Urgency of urination    Uterine bleeding 2008   Uterine artery embolization in 2008 for uterine bleeding.     Family History  Problem Relation Age of Onset   Hypertension Mother    Diabetes Mother    Uterine cancer Sister 41   Prostate cancer Brother 58   Prostate cancer Brother 10   Prostate cancer Brother    Prostate cancer Maternal Uncle    Prostate cancer Maternal Uncle    Non-Hodgkin's lymphoma Paternal Aunt 23   Pancreatic cancer Paternal Aunt 6   Colon cancer Paternal Grandmother 55   Healthy Son    Breast cancer Neg Hx    Past Surgical History:  Procedure Laterality Date   BREAST BIOPSY Left 11/08/2022   [place] LT BREAST BX W LOC DEV EA ADD LESION IMG BX SPEC US GUIDE 11/08/2022 [facility]   BREAST BIOPSY Left 11/08/2022   [place] LT BREAST BX W LOC DEV 1ST LESION IMG BX SPEC US GUIDE 11/08/2022 [facility]   BREAST BIOPSY Left 12/17/2022   [place] LT RADIOACTIVE SEED LOC 12/17/2022 [facility]   BREAST BIOPSY Left 12/17/2022   [place] LT RADIOACTIVE SEED EA ADD LESION 12/17/2022 [facility]   BREAST BIOPSY Left 12/17/2022   [place] LT RADIOACTIVE SEED EA ADD LESION 12/17/2022 [facility]   BREAST LUMPECTOMY WITH RADIOACTIVE SEED AND SENTINEL LYMPH NODE BIOPSY Left 12/21/2022   Procedure: LEFT BREAST BRACKETED SEED LUMPECTOMY AND LEFT SENTINEL LYMPH NODE MAPPING;  Surgeon: Harriette Bouillon, MD;  Location: [facility];  Service: General;  Laterality: Left;  GEN & PEC BLOCK   COLONOSCOPY W/ POLYPECTOMY     Diagnostic D&C hysteroscopy and Novasure ablation  03/17/2004   DILATION AND CURETTAGE OF UTERUS  12/17/2011   Procedure: DILATATION AND CURETTAGE;  Surgeon: Kathreen Cosier, MD;  Location: [facility];  Service: Gynecology;  Laterality: N/A;  With Attempted Hydrothermal Ablation   LUMBAR LAMINECTOMY/DECOMPRESSION MICRODISCECTOMY Left 02/21/2013   Procedure: LUMBAR LAMINECTOMY/DECOMPRESSION MICRODISCECTOMY 1 LEVEL;  Surgeon: Carmela Hurt, MD;  Location: [facility];  Service: Neurosurgery;  Laterality: Left;  LEFT Lumbar Three-Four Laminotomy foraminotomy microdiskectomy   MANDIBLE SURGERY     under bite   PVC Ablation  2010   ROBOTIC ASSISTED TOTAL HYSTERECTOMY WITH BILATERAL SALPINGO OOPHERECTOMY Bilateral 08/21/2018   Procedure: XI ROBOTIC ASSISTED TOTAL HYSTERECTOMY WITH BILATERAL SALPINGO OOPHORECTOMY;  Surgeon: Adolphus Birchwood, MD;  Location: [facility] [place];  Service: Gynecology;  Laterality: Bilateral;   SPINE SURGERY  TONSILLECTOMY     UTERINE ARTERY EMBOLIZATION  2008   for uterine bleeding   VASCULAR SURGERY     laser of left leg   Social History   Social History Narrative   Works as a Engineer, civil (consulting) at [employer] History  Administered Date(s) Administered   Influenza,inj,Quad PF,6+ Mos 06/14/2019   Influenza-Unspecified 05/05/2021   PFIZER(Purple Top)SARS-COV-2 Vaccination 11/08/2019, 11/30/2019, 08/19/2020   PNEUMOCOCCAL CONJUGATE-20 08/26/2021   Tdap 02/03/2015   Zoster Recombinant(Shingrix) 08/26/2021     Objective: Vital Signs: BP 125/84 (BP Location: Right Arm, Cuff Size: Large)   Pulse 79   Resp 13   Ht 5\' 9"  (1.753 m)   Wt 254 lb 12.8 oz (115.6 kg)   BMI 37.63 kg/m    Physical Exam Vitals and nursing note reviewed.  Constitutional:      Appearance: She is well-developed.  HENT:     Head:  Normocephalic and atraumatic.  Eyes:     Conjunctiva/sclera: Conjunctivae normal.  Cardiovascular:     Rate and Rhythm: Normal rate and regular rhythm.     Heart sounds: Normal heart sounds.  Pulmonary:     Effort: Pulmonary effort is normal.     Breath sounds: Normal breath sounds.  Abdominal:     General: Bowel sounds are normal.     Palpations: Abdomen is soft.  Musculoskeletal:     Cervical back: Normal range of motion.  Lymphadenopathy:     Cervical: No cervical adenopathy.  Skin:    General: Skin is warm and dry.     Capillary Refill: Capillary refill takes less than 2 seconds.  Neurological:     Mental Status: She is alert and oriented to person, place, and time.  Psychiatric:        Behavior: Behavior normal.      Musculoskeletal Exam: She had good range of motion of the cervical spine.  There was no tenderness over thoracic or lumbar spine.  She good range of motion of bilateral shoulders, elbows, wrist joints, MCPs PIPs and DIPs with good range of motion with no synovitis.  Hip joints and knee joints in good range of motion.  There was no tenderness over ankles or MTPs.  CDAI Exam: CDAI Score: -- Patient Global: 10 / 100; Provider Global: 10 / 100 Swollen: --; Tender: -- Joint Exam 11/10/2023   No joint exam has been documented for this visit   There is currently no information documented on the homunculus. Go to the Rheumatology activity and complete the homunculus joint exam.  Investigation: No additional findings.  Imaging: MM 3D DIAGNOSTIC MAMMOGRAM BILATERAL BREAST Result Date: 10/27/2023 CLINICAL DATA:  68 year old female presents for annual follow-up. History of LEFT breast cancer and lumpectomy in 2024. EXAM: DIGITAL DIAGNOSTIC BILATERAL MAMMOGRAM WITH TOMOSYNTHESIS AND CAD TECHNIQUE: Bilateral digital diagnostic mammography and breast tomosynthesis was performed. The images were evaluated with computer-aided detection. COMPARISON:  Previous exam(s). ACR  Breast Density Category b: There are scattered areas of fibroglandular density. FINDINGS: Full field views of both breasts and a magnification view of the lumpectomy site demonstrate no suspicious mass, nonsurgical distortion or worrisome calcifications. Surgical changes within the LEFT breast and LEFT axilla are noted. IMPRESSION: No evidence of breast malignancy. RECOMMENDATION: Bilateral diagnostic mammogram in 1 year. I have discussed the findings and recommendations with the patient. If applicable, a reminder letter will be sent to the patient regarding the next appointment. BI-RADS CATEGORY  2: Benign. Electronically Signed   By: Henrietta Hoover.D.  On: 10/27/2023 11:24   US Guided Needle Placement Result Date: 10/18/2023 Ultrasound guided injection is preferred based studies that show increased duration, increased effect, greater accuracy, decreased procedural pain, increased response rate, and decreased cost with ultrasound guided versus blind injection.   Verbal informed consent obtained.  Time-out conducted.  Noted no overlying erythema, induration, or other signs of local infection. Ultrasound-guided trigger finger injection: After sterile prep with Betadine, injected 0.5 mL of 1% lidocaine and 20 mg Kenalog using a 27-gauge needle needle, in the flexor tendon sheath.     Recent Labs: Lab Results  Component Value Date   WBC 6.4 09/19/2023   HGB 11.6 (L) 09/19/2023   PLT 283 09/19/2023   NA 140 09/19/2023   K 4.2 09/19/2023   CL 108 09/19/2023   CO2 23 09/19/2023   GLUCOSE 90 09/19/2023   BUN 13 09/19/2023   CREATININE 1.10 (H) 09/19/2023   BILITOT 0.3 09/19/2023   ALKPHOS 79 07/17/2023   AST 19 09/19/2023   ALT 11 09/19/2023   PROT 7.1 09/19/2023   ALBUMIN 3.0 (L) 07/17/2023   CALCIUM 9.8 09/19/2023   GFRAA 72 11/13/2020   QFTBGOLDPLUS NEGATIVE 01/15/2019    Speciality Comments: PLQ Eye Exam: 01/03/2023 WNL @ [facility]  Follow up in 1 year. Hydroxychloroquine since  May 2020  Procedures:  No procedures performed Allergies: Methylprednisolone, Sulfa antibiotics, Sulfonamide derivatives, and Macrobid [nitrofurantoin]   Assessment / Plan:     Visit Diagnoses: Rheumatoid arthritis with rheumatoid factor of multiple sites without organ or systems involvement (HCC) - RF 74, CCP >250 ,Sed rate 45, ANA 1: 320 cytoplasmic, positive synovitis: Patient is doing better since she has been on the combination of leflunomide and Plaquenil.  Leflunomide was added at the last visit in December.  She has been tolerating it well without any side effects.  No synovitis was noted on the examination today.  She states that she gets intermittent discomfort in her wrist joints after certain activities.  No synovitis was noted on the examination today.  High risk medication use - Arava 20mg  by mouth daily, Plaquenil 200 mg 1 tablet by mouth twice daily. -Started in May 2020. PLQ Eye Exam normal on 01/03/23.  September 19, 2023 CBC and CMP were stable.  Hemoglobin was low at 11.6 and creatinine 1.10.  LFTs were normal.  She was advised to get labs every 3 months.  Information normalization was placed in the AVS.  She was advised to hold Areva if she develops an infection resume after the infection resolves.  Elevated serum creatinine-creatinine stays mildly elevated.-  Chronic pain of both shoulders- she had good range of motion of bilateral shoulder joints without any discomfort.   Trigger finger, left ring finger-resolved  Trigger middle finger of right hand-patient reports intermittent triggering.  Trigger index finger of right hand-resolved  Primary osteoarthritis of both hands-she continues to have some discomfort in her hands.  No synovitis was noted on the examination.  She states the discomfort is sometimes in the left wrist joint.  Trochanteric bursitis of both hips-improved without any tenderness over the trochanteric region.  Primary osteoarthritis of both knees-good range  of motion of bilateral knee joints without any warmth swelling or effusion.  Primary osteoarthritis of both feet-upper fitting shoes were advised.  Positive ANA (antinuclear antibody) - ENA, C3-C4, anticardiolipin, lupus anticoagulant, beta-2 GP 1-were all negative.  She has no other clinical features of autoimmune disease.  De Quervain's disease (radial styloid tenosynovitis) right-resolved  Other  medical problems are listed as follows:  History of DVT (deep vein thrombosis)  History of pulmonary embolism  Hyperlipidemia with target LDL less than 130  PVC's (premature ventricular contractions)  Essential hypertension  History of atrial fibrillation  Vitamin D deficiency  COVID-19 virus infection - 03/2023  History of recent pneumonia - 07/17/23  History of anemia  Orders: No orders of the defined types were placed in this encounter.  No orders of the defined types were placed in this encounter.    Follow-Up Instructions: Return in about 5 months (around 04/11/2024) for Rheumatoid arthritis, Osteoarthritis.   Pollyann Savoy, MD  Note - This record has been created using Animal nutritionist.  Chart creation errors have been sought, but may not always  have been located. Such creation errors do not reflect on  the standard of medical care.

## 2023-11-05 ENCOUNTER — Other Ambulatory Visit (HOSPITAL_COMMUNITY): Payer: Self-pay

## 2023-11-07 ENCOUNTER — Other Ambulatory Visit (HOSPITAL_COMMUNITY): Payer: Self-pay

## 2023-11-10 ENCOUNTER — Ambulatory Visit: Payer: Medicare HMO | Attending: Rheumatology | Admitting: Rheumatology

## 2023-11-10 ENCOUNTER — Encounter: Payer: Self-pay | Admitting: Rheumatology

## 2023-11-10 VITALS — BP 125/84 | HR 79 | Resp 13 | Ht 69.0 in | Wt 254.8 lb

## 2023-11-10 DIAGNOSIS — M654 Radial styloid tenosynovitis [de Quervain]: Secondary | ICD-10-CM

## 2023-11-10 DIAGNOSIS — M170 Bilateral primary osteoarthritis of knee: Secondary | ICD-10-CM

## 2023-11-10 DIAGNOSIS — M7062 Trochanteric bursitis, left hip: Secondary | ICD-10-CM

## 2023-11-10 DIAGNOSIS — I493 Ventricular premature depolarization: Secondary | ICD-10-CM

## 2023-11-10 DIAGNOSIS — M19041 Primary osteoarthritis, right hand: Secondary | ICD-10-CM | POA: Diagnosis not present

## 2023-11-10 DIAGNOSIS — M0579 Rheumatoid arthritis with rheumatoid factor of multiple sites without organ or systems involvement: Secondary | ICD-10-CM

## 2023-11-10 DIAGNOSIS — M19072 Primary osteoarthritis, left ankle and foot: Secondary | ICD-10-CM

## 2023-11-10 DIAGNOSIS — I10 Essential (primary) hypertension: Secondary | ICD-10-CM

## 2023-11-10 DIAGNOSIS — M7061 Trochanteric bursitis, right hip: Secondary | ICD-10-CM | POA: Diagnosis not present

## 2023-11-10 DIAGNOSIS — R7989 Other specified abnormal findings of blood chemistry: Secondary | ICD-10-CM

## 2023-11-10 DIAGNOSIS — M65321 Trigger finger, right index finger: Secondary | ICD-10-CM

## 2023-11-10 DIAGNOSIS — G8929 Other chronic pain: Secondary | ICD-10-CM

## 2023-11-10 DIAGNOSIS — E785 Hyperlipidemia, unspecified: Secondary | ICD-10-CM

## 2023-11-10 DIAGNOSIS — E559 Vitamin D deficiency, unspecified: Secondary | ICD-10-CM

## 2023-11-10 DIAGNOSIS — Z86711 Personal history of pulmonary embolism: Secondary | ICD-10-CM

## 2023-11-10 DIAGNOSIS — U071 COVID-19: Secondary | ICD-10-CM

## 2023-11-10 DIAGNOSIS — M25511 Pain in right shoulder: Secondary | ICD-10-CM

## 2023-11-10 DIAGNOSIS — M19071 Primary osteoarthritis, right ankle and foot: Secondary | ICD-10-CM

## 2023-11-10 DIAGNOSIS — Z86718 Personal history of other venous thrombosis and embolism: Secondary | ICD-10-CM

## 2023-11-10 DIAGNOSIS — Z79899 Other long term (current) drug therapy: Secondary | ICD-10-CM

## 2023-11-10 DIAGNOSIS — M25512 Pain in left shoulder: Secondary | ICD-10-CM

## 2023-11-10 DIAGNOSIS — Z8701 Personal history of pneumonia (recurrent): Secondary | ICD-10-CM

## 2023-11-10 DIAGNOSIS — Z862 Personal history of diseases of the blood and blood-forming organs and certain disorders involving the immune mechanism: Secondary | ICD-10-CM

## 2023-11-10 DIAGNOSIS — M65342 Trigger finger, left ring finger: Secondary | ICD-10-CM

## 2023-11-10 DIAGNOSIS — M19042 Primary osteoarthritis, left hand: Secondary | ICD-10-CM

## 2023-11-10 DIAGNOSIS — Z8679 Personal history of other diseases of the circulatory system: Secondary | ICD-10-CM

## 2023-11-10 DIAGNOSIS — R768 Other specified abnormal immunological findings in serum: Secondary | ICD-10-CM

## 2023-11-10 DIAGNOSIS — M65331 Trigger finger, right middle finger: Secondary | ICD-10-CM | POA: Diagnosis not present

## 2023-11-10 NOTE — Patient Instructions (Signed)
 Standing Labs We placed an order today for your standing lab work.   Please have your standing labs drawn in April and every 3 months  Please have your labs drawn 2 weeks prior to your appointment so that the provider can discuss your lab results at your appointment, if possible.  Please note that you may see your imaging and lab results in MyChart before we have reviewed them. We will contact you once all results are reviewed. Please allow our office up to 72 hours to thoroughly review all of the results before contacting the office for clarification of your results.  WALK-IN LAB HOURS  Monday through Thursday from 8:00 am -12:30 pm and 1:00 pm-5:00 pm and Friday from 8:00 am-12:00 pm.  Patients with office visits requiring labs will be seen before walk-in labs.  You may encounter longer than normal wait times. Please allow additional time. Wait times may be shorter on  Monday and Thursday afternoons.  We do not book appointments for walk-in labs. We appreciate your patience and understanding with our staff.   Labs are drawn by [facility]. Please bring your co-pay at the time of your lab draw.  You may receive a bill from [facility] for your lab work.  Please note if you are on Hydroxychloroquine and and an order has been placed for a Hydroxychloroquine level,  you will need to have it drawn 4 hours or more after your last dose.  If you wish to have your labs drawn at another location, please call the office 24 hours in advance so we can fax the orders.  The office is located at [address], [place], [place] [address]   If you have any questions regarding directions or hours of operation,  please call [phone].   As a reminder, please drink plenty of water prior to coming for your lab work. Thanks!   Vaccines You are taking a medication(s) that can suppress your immune system.  The following immunizations are recommended: Flu annually Covid-19  RSV Td/Tdap (tetanus,  diphtheria, pertussis) every 10 years Pneumonia (Prevnar 15 then Pneumovax 23 at least 1 year apart.  Alternatively, can take Prevnar 20 without needing additional dose) Shingrix: 2 doses from 4 weeks to 6 months apart  Please check with your PCP to make sure you are up to date.  If you have signs or symptoms of an infection or start antibiotics: First, call your PCP for workup of your infection. Hold your medication through the infection, until you complete your antibiotics, and until symptoms resolve if you take the following: Injectable medication (Actemra, Benlysta, Cimzia, Cosentyx, Enbrel, Humira, Kevzara, Orencia, Remicade, Simponi, Stelara, Taltz, Tremfya) Methotrexate Leflunomide (Arava) Mycophenolate (Cellcept) Harriette Ohara, Olumiant, or Rinvoq

## 2023-11-21 ENCOUNTER — Other Ambulatory Visit: Payer: Self-pay | Admitting: Obstetrics

## 2023-11-21 DIAGNOSIS — R110 Nausea: Secondary | ICD-10-CM

## 2023-11-21 MED ORDER — HYDROXYZINE PAMOATE 50 MG PO CAPS: 50.0000 mg | ORAL_CAPSULE | Freq: Three times a day (TID) | ORAL | 2 refills | Status: AC | PRN

## 2023-12-08 ENCOUNTER — Other Ambulatory Visit (HOSPITAL_COMMUNITY): Payer: Self-pay

## 2023-12-08 ENCOUNTER — Other Ambulatory Visit: Payer: Self-pay

## 2023-12-08 ENCOUNTER — Other Ambulatory Visit: Payer: Self-pay | Admitting: *Deleted

## 2023-12-08 DIAGNOSIS — I480 Paroxysmal atrial fibrillation: Secondary | ICD-10-CM

## 2023-12-08 DIAGNOSIS — M0579 Rheumatoid arthritis with rheumatoid factor of multiple sites without organ or systems involvement: Secondary | ICD-10-CM

## 2023-12-08 MED ORDER — HYDROXYCHLOROQUINE SULFATE 200 MG PO TABS: 200.0000 mg | ORAL_TABLET | Freq: Two times a day (BID) | ORAL | 0 refills | Status: DC

## 2023-12-08 MED ORDER — LEFLUNOMIDE 20 MG PO TABS: 20.0000 mg | ORAL_TABLET | Freq: Every day | ORAL | 0 refills | Status: DC

## 2023-12-08 MED ORDER — NADOLOL 20 MG PO TABS: 20.0000 mg | ORAL_TABLET | Freq: Every day | ORAL | 2 refills | Status: DC

## 2023-12-08 NOTE — Telephone Encounter (Signed)
 Refill request received via fax from [facility] for Plaquenil and Arava  Last Fill: 09/19/2023 (PLQ), 10/03/2023 Ranae Plumber)  Eye exam: 01/03/2023 WNL    Labs: 09/19/2023 Creatinine remains elevated but has improved--1.10--continues to trend down and GFR is low-55-improved from 53.  Rest of CMP WNL Hemoglobin remains low but has improved-11.6.   Absolute lymphocytes are low but have improved.   Next Visit: 04/17/2024  Last Visit: 11/10/2023  NF:[id number] arthritis with rheumatoid factor of multiple sites without organ or systems involvement   Current Dose per office note 11/10/2023:  Arava 20mg  by mouth daily, Plaquenil 200 mg 1 tablet by mouth twice daily   Okay to refill Plaquenil and Arava?

## 2023-12-09 ENCOUNTER — Other Ambulatory Visit: Payer: Self-pay

## 2023-12-09 DIAGNOSIS — I480 Paroxysmal atrial fibrillation: Secondary | ICD-10-CM

## 2023-12-09 MED ORDER — RIVAROXABAN 20 MG PO TABS
20.0000 mg | ORAL_TABLET | Freq: Every day | ORAL | 1 refills | Status: DC
Start: 2023-12-09 — End: 2024-05-07

## 2023-12-09 MED ORDER — IRBESARTAN 300 MG PO TABS: 300.0000 mg | ORAL_TABLET | Freq: Every day | ORAL | 2 refills | Status: DC

## 2023-12-09 MED ORDER — AMLODIPINE BESYLATE 10 MG PO TABS: 10.0000 mg | ORAL_TABLET | Freq: Every day | ORAL | 2 refills | Status: DC

## 2023-12-09 NOTE — Telephone Encounter (Signed)
 Xarelto 20mg  refill request received. Pt is 68 years old, weight-115.6kg, Crea-1.10 on 09/19/23, last seen by Otilio Saber on 07/25/23, Diagnosis-Afib, CrCl-90.57 mL/min; Dose is appropriate based on dosing criteria. Will send in refill to requested pharmacy.

## 2023-12-14 ENCOUNTER — Other Ambulatory Visit: Payer: Self-pay | Admitting: Internal Medicine

## 2023-12-14 DIAGNOSIS — K2100 Gastro-esophageal reflux disease with esophagitis, without bleeding: Secondary | ICD-10-CM

## 2023-12-14 NOTE — Telephone Encounter (Signed)
 Copied from CRM [id number]. Topic: Clinical - Medication Refill >> Dec 14, 2023  4:11 PM Howard Macho wrote: Most Recent Primary Care Visit:  Provider: Arcadio Knuckles  Department: [facility]  Visit Type: OFFICE VISIT  Date: 05/04/2022  Medication: esomeprazole (NEXIUM) 40 MG capsule  Has the patient contacted their pharmacy? No (Agent: If no, request that the patient contact the pharmacy for the refill. If patient does not wish to contact the pharmacy document the reason why and proceed with request.) (Agent: If yes, when and what did the pharmacy advise?)  Is this the correct pharmacy for this prescription? Yes If no, delete pharmacy and type the correct one.  This is the patient's preferred pharmacy:  [facility] [phone] [place]. [place] [address] [phone]  Has the prescription been filled recently? No  Is the patient out of the medication? Yes  Has the patient been seen for an appointment in the last year OR does the patient have an upcoming appointment? Yes  Can we respond through MyChart? Yes  Agent: Please be advised that Rx refills may take up to 3 business days. We ask that you follow-up with your pharmacy.

## 2023-12-20 ENCOUNTER — Inpatient Hospital Stay: Payer: Medicare HMO | Attending: Hematology and Oncology | Admitting: Hematology and Oncology

## 2023-12-20 ENCOUNTER — Ambulatory Visit (INDEPENDENT_AMBULATORY_CARE_PROVIDER_SITE_OTHER): Admitting: Internal Medicine

## 2023-12-20 ENCOUNTER — Encounter: Payer: Self-pay | Admitting: Internal Medicine

## 2023-12-20 VITALS — BP 131/69 | HR 77 | Temp 97.5°F | Resp 18 | Ht 69.0 in | Wt 255.4 lb

## 2023-12-20 VITALS — BP 140/82 | HR 80 | Temp 98.3°F | Resp 16 | Ht 69.0 in | Wt 254.8 lb

## 2023-12-20 DIAGNOSIS — E519 Thiamine deficiency, unspecified: Secondary | ICD-10-CM | POA: Diagnosis not present

## 2023-12-20 DIAGNOSIS — D513 Other dietary vitamin B12 deficiency anemia: Secondary | ICD-10-CM

## 2023-12-20 DIAGNOSIS — D539 Nutritional anemia, unspecified: Secondary | ICD-10-CM

## 2023-12-20 DIAGNOSIS — R072 Precordial pain: Secondary | ICD-10-CM | POA: Diagnosis not present

## 2023-12-20 DIAGNOSIS — I480 Paroxysmal atrial fibrillation: Secondary | ICD-10-CM

## 2023-12-20 DIAGNOSIS — K2100 Gastro-esophageal reflux disease with esophagitis, without bleeding: Secondary | ICD-10-CM

## 2023-12-20 DIAGNOSIS — Z170 Estrogen receptor positive status [ER+]: Secondary | ICD-10-CM | POA: Diagnosis not present

## 2023-12-20 DIAGNOSIS — Z0000 Encounter for general adult medical examination without abnormal findings: Secondary | ICD-10-CM

## 2023-12-20 DIAGNOSIS — C50312 Malignant neoplasm of lower-inner quadrant of left female breast: Secondary | ICD-10-CM | POA: Insufficient documentation

## 2023-12-20 DIAGNOSIS — Z0001 Encounter for general adult medical examination with abnormal findings: Secondary | ICD-10-CM

## 2023-12-20 DIAGNOSIS — D509 Iron deficiency anemia, unspecified: Secondary | ICD-10-CM | POA: Diagnosis not present

## 2023-12-20 DIAGNOSIS — R0609 Other forms of dyspnea: Secondary | ICD-10-CM

## 2023-12-20 DIAGNOSIS — D538 Other specified nutritional anemias: Secondary | ICD-10-CM

## 2023-12-20 DIAGNOSIS — Z1721 Progesterone receptor positive status: Secondary | ICD-10-CM | POA: Diagnosis not present

## 2023-12-20 DIAGNOSIS — I10 Essential (primary) hypertension: Secondary | ICD-10-CM

## 2023-12-20 DIAGNOSIS — E785 Hyperlipidemia, unspecified: Secondary | ICD-10-CM | POA: Diagnosis not present

## 2023-12-20 DIAGNOSIS — E559 Vitamin D deficiency, unspecified: Secondary | ICD-10-CM | POA: Diagnosis not present

## 2023-12-20 DIAGNOSIS — Z1731 Human epidermal growth factor receptor 2 positive status: Secondary | ICD-10-CM | POA: Insufficient documentation

## 2023-12-20 LAB — IBC + FERRITIN
Ferritin: 76.2 ng/mL (ref 10.0–291.0)
Iron: 90 ug/dL (ref 42–145)
Saturation Ratios: 31.5 % (ref 20.0–50.0)
TIBC: 285.6 ug/dL (ref 250.0–450.0)
Transferrin: 204 mg/dL — ABNORMAL LOW (ref 212.0–360.0)

## 2023-12-20 LAB — CBC WITH DIFFERENTIAL/PLATELET
Basophils Absolute: 0 10*3/uL (ref 0.0–0.1)
Basophils Relative: 0.5 % (ref 0.0–3.0)
Eosinophils Absolute: 0.2 10*3/uL (ref 0.0–0.7)
Eosinophils Relative: 2.8 % (ref 0.0–5.0)
HCT: 37 % (ref 36.0–46.0)
Hemoglobin: 12 g/dL (ref 12.0–15.0)
Lymphocytes Relative: 12.6 % (ref 12.0–46.0)
Lymphs Abs: 0.7 10*3/uL (ref 0.7–4.0)
MCHC: 32.4 g/dL (ref 30.0–36.0)
MCV: 80.7 fl (ref 78.0–100.0)
Monocytes Absolute: 0.3 10*3/uL (ref 0.1–1.0)
Monocytes Relative: 5.4 % (ref 3.0–12.0)
Neutro Abs: 4.5 10*3/uL (ref 1.4–7.7)
Neutrophils Relative %: 78.7 % — ABNORMAL HIGH (ref 43.0–77.0)
Platelets: 261 10*3/uL (ref 150.0–400.0)
RBC: 4.58 Mil/uL (ref 3.87–5.11)
RDW: 14.5 % (ref 11.5–15.5)
WBC: 5.8 10*3/uL (ref 4.0–10.5)

## 2023-12-20 LAB — TSH: TSH: 2.01 u[IU]/mL (ref 0.35–5.50)

## 2023-12-20 LAB — LIPID PANEL
Cholesterol: 185 mg/dL (ref 0–200)
HDL: 35.6 mg/dL — ABNORMAL LOW (ref >39.00)
LDL Cholesterol: 128 mg/dL — ABNORMAL HIGH (ref 0–99)
NonHDL: 149.53
Total CHOL/HDL Ratio: 5
Triglycerides: 108 mg/dL (ref 0.0–149.0)
VLDL: 21.6 mg/dL (ref 0.0–40.0)

## 2023-12-20 LAB — VITAMIN D 25 HYDROXY (VIT D DEFICIENCY, FRACTURES): VITD: 25.01 ng/mL — ABNORMAL LOW (ref 30.00–100.00)

## 2023-12-20 LAB — FOLATE: Folate: 7.5 ng/mL (ref >5.9)

## 2023-12-20 LAB — BRAIN NATRIURETIC PEPTIDE: Pro B Natriuretic peptide (BNP): 12 pg/mL (ref 0.0–100.0)

## 2023-12-20 LAB — VITAMIN B12: Vitamin B-12: 267 pg/mL (ref 211–911)

## 2023-12-20 LAB — TROPONIN I (HIGH SENSITIVITY): High Sens Troponin I: 6 ng/L (ref 2–17)

## 2023-12-20 MED ORDER — ROSUVASTATIN CALCIUM 10 MG PO TABS: 10.0000 mg | ORAL_TABLET | Freq: Every day | ORAL | 0 refills | Status: DC

## 2023-12-20 MED ORDER — VITAMIN D-3 25 MCG (1000 UT) PO CAPS: 2000.0000 [IU] | ORAL_CAPSULE | Freq: Every day | ORAL | 1 refills | Status: AC

## 2023-12-20 MED ORDER — ESOMEPRAZOLE MAGNESIUM 40 MG PO CPDR
40.0000 mg | DELAYED_RELEASE_CAPSULE | Freq: Every day | ORAL | 1 refills | Status: DC
Start: 2023-12-20 — End: 2023-12-20

## 2023-12-20 MED ORDER — EXEMESTANE 25 MG PO TABS: 25.0000 mg | ORAL_TABLET | Freq: Every day | ORAL | 3 refills | Status: DC

## 2023-12-20 MED ORDER — ESOMEPRAZOLE MAGNESIUM 40 MG PO CPDR: 40.0000 mg | DELAYED_RELEASE_CAPSULE | Freq: Every day | ORAL | 1 refills | Status: DC

## 2023-12-20 MED ORDER — ROSUVASTATIN CALCIUM 10 MG PO TABS
10.0000 mg | ORAL_TABLET | Freq: Every day | ORAL | 0 refills | Status: DC
Start: 2023-12-20 — End: 2024-05-04

## 2023-12-20 NOTE — Progress Notes (Signed)
 Subjective:  Patient ID: Pamela Davenport, female    DOB: 1956-05-15  Age: 68 y.o. MRN: [id number]  CC: Annual Exam, Hypertension, and Hyperlipidemia   HPI MUSKAAN SMET presents for a CPX and f/up ----  Discussed the use of AI scribe software for clinical note transcription with the patient, who gave verbal consent to proceed.  History of Present Illness   Pamela Davenport is a 68 year old female with breast cancer and atrial fibrillation who presents with chest pain and shortness of breath.  She was diagnosed with breast cancer in March 2024 and underwent a lumpectomy, sentinel lymph node removal, and completed 33 rounds of radiation therapy, with the last session on March 06, 2023. Her Oncotype DX score was 18, so she did not require chemotherapy. She is currently on exemestane , her third medication, to prevent cancer recurrence, as previous medications exacerbated her arthritis.  In August 2024, she contracted COVID-19, followed by the flu a month later, and pneumonia another month after that. Her symptoms of chest pain and shortness of breath worsened after these illnesses. The chest pain and shortness of breath are intermittent and sometimes occur with exertion. She also experiences tenderness in the area affected by radiation, which she finds difficult to distinguish from her chest pain.  She has a history of atrial fibrillation but has not experienced it in a couple of years. No recent atrial fibrillation episodes. Her last EKG was approximately six months ago.  No current blood pressure symptoms such as headaches or blurred vision. She takes her blood pressure medication at night and reports a recent blood pressure reading of 142/86.  She has lost 30 pounds since her last visit and denies any gastrointestinal issues or thyroid -related symptoms.       Outpatient Medications Prior to Visit  Medication Sig Dispense Refill   ALPRAZolam  (XANAX ) 0.5 MG tablet Take 1 tablet (0.5 mg  total) by mouth 2 (two) times daily. (Patient taking differently: Take 0.5 mg by mouth as needed.) 60 tablet 2   amLODipine  (NORVASC ) 10 MG tablet Take 1 tablet (10 mg total) by mouth at bedtime. 90 tablet 2   Black Pepper-Turmeric (TURMERIC PLUS BLACK PEPPER EXT PO) Take by mouth daily.     exemestane  (AROMASIN ) 25 MG tablet Take 1 tablet (25 mg total) by mouth daily after breakfast. 90 tablet 3   flecainide  (TAMBOCOR ) 150 MG tablet Take 2 tablets by mouth at the onset of AFIB 30 tablet 3   hydroxychloroquine  (PLAQUENIL ) 200 MG tablet Take 1 tablet (200 mg total) by mouth 2 (two) times daily. 180 tablet 0   hydrOXYzine  (VISTARIL ) 50 MG capsule Take 1 capsule (50 mg total) by mouth 3 (three) times daily as needed. 60 capsule 2   irbesartan  (AVAPRO ) 300 MG tablet Take 1 tablet (300 mg total) by mouth daily. 90 tablet 2   leflunomide  (ARAVA ) 20 MG tablet Take 1 tablet (20 mg total) by mouth daily. 90 tablet 0   nadolol  (CORGARD ) 20 MG tablet Take 1 tablet (20 mg total) by mouth daily. 90 tablet 2   nystatin -triamcinolone  ointment (MYCOLOG) Apply to affected areas two times a day (Patient taking differently: Apply 1 Application topically as needed.) 20 g 2   rivaroxaban  (XARELTO ) 20 MG TABS tablet Take 1 tablet (20 mg total) by mouth daily with supper. 90 tablet 1   solifenacin (VESICARE) 5 MG tablet Take 5 mg by mouth daily.     TART CHERRY PO Take 2 tablets by  mouth daily.     thiamine  (VITAMIN B-1) 50 MG tablet Take 1 tablet (50 mg total) by mouth daily. 90 tablet 1   Cholecalciferol  (VITAMIN D -3) 25 MCG (1000 UT) CAPS Take by mouth.     esomeprazole  (NEXIUM ) 40 MG capsule Take 1 capsule (40 mg total) by mouth daily. 90 capsule 0   No facility-administered medications prior to visit.    ROS Review of Systems  Constitutional:  Negative for appetite change, chills, diaphoresis, fatigue and fever.  HENT: Negative.    Eyes:  Negative for visual disturbance.  Respiratory:  Positive for chest  tightness and shortness of breath. Negative for cough and wheezing.   Cardiovascular:  Negative for chest pain, palpitations and leg swelling.  Gastrointestinal: Negative.  Negative for abdominal pain, diarrhea and nausea.  Genitourinary: Negative.  Negative for difficulty urinating.  Musculoskeletal: Negative.  Negative for arthralgias and myalgias.  Neurological:  Negative for dizziness, weakness and headaches.  Hematological:  Negative for adenopathy. Does not bruise/bleed easily.  Psychiatric/Behavioral: Negative.      Objective:  BP (!) 140/82 (BP Location: Right Arm, Patient Position: Sitting, Cuff Size: Normal)   Pulse 80   Temp 98.3 F (36.8 C) (Oral)   Resp 16   Ht 5\' 9"  (1.753 m)   Wt 254 lb 12.8 oz (115.6 kg)   SpO2 99%   BMI 37.63 kg/m   BP Readings from Last 3 Encounters:  12/20/23 (!) 140/82  12/20/23 131/69  11/10/23 125/84    Wt Readings from Last 3 Encounters:  12/20/23 254 lb 12.8 oz (115.6 kg)  12/20/23 255 lb 6.4 oz (115.8 kg)  11/10/23 254 lb 12.8 oz (115.6 kg)    Physical Exam Vitals reviewed.  Constitutional:      Appearance: Normal appearance.  HENT:     Nose: Nose normal.     Mouth/Throat:     Mouth: Mucous membranes are moist.  Eyes:     General: No scleral icterus.    Conjunctiva/sclera: Conjunctivae normal.  Cardiovascular:     Rate and Rhythm: Normal rate and regular rhythm.     Heart sounds: No murmur heard.    No friction rub. No gallop.     Comments: EKG---  NSR, 65 bpm No LVH, Q waves, or ST/T wave changes  Pulmonary:     Effort: Pulmonary effort is normal.     Breath sounds: No stridor. No wheezing, rhonchi or rales.  Abdominal:     Palpations: There is no mass.     Tenderness: There is no abdominal tenderness. There is no guarding.     Hernia: No hernia is present.  Musculoskeletal:        General: Normal range of motion.     Cervical back: Neck supple.     Right lower leg: No edema.     Left lower leg: No edema.   Lymphadenopathy:     Cervical: No cervical adenopathy.  Skin:    General: Skin is warm and dry.     Coloration: Skin is not pale.     Findings: No rash.  Neurological:     General: No focal deficit present.     Mental Status: She is alert. Mental status is at baseline.  Psychiatric:        Mood and Affect: Mood normal.        Behavior: Behavior normal.     Lab Results  Component Value Date   WBC 5.8 12/20/2023   HGB 12.0 12/20/2023  HCT 37.0 12/20/2023   PLT 261.0 12/20/2023   GLUCOSE 90 09/19/2023   CHOL 185 12/20/2023   TRIG 108.0 12/20/2023   HDL 35.60 (L) 12/20/2023   LDLCALC 128 (H) 12/20/2023   ALT 11 09/19/2023   AST 19 09/19/2023   NA 140 09/19/2023   K 4.2 09/19/2023   CL 108 09/19/2023   CREATININE 1.10 (H) 09/19/2023   BUN 13 09/19/2023   CO2 23 09/19/2023   TSH 2.01 12/20/2023   INR 3.1 04/26/2013    MM 3D DIAGNOSTIC MAMMOGRAM BILATERAL BREAST Result Date: 10/27/2023 CLINICAL DATA:  68 year old female presents for annual follow-up. History of LEFT breast cancer and lumpectomy in 2024. EXAM: DIGITAL DIAGNOSTIC BILATERAL MAMMOGRAM WITH TOMOSYNTHESIS AND CAD TECHNIQUE: Bilateral digital diagnostic mammography and breast tomosynthesis was performed. The images were evaluated with computer-aided detection. COMPARISON:  Previous exam(s). ACR Breast Density Category b: There are scattered areas of fibroglandular density. FINDINGS: Full field views of both breasts and a magnification view of the lumpectomy site demonstrate no suspicious mass, nonsurgical distortion or worrisome calcifications. Surgical changes within the LEFT breast and LEFT axilla are noted. IMPRESSION: No evidence of breast malignancy. RECOMMENDATION: Bilateral diagnostic mammogram in 1 year. I have discussed the findings and recommendations with the patient. If applicable, a reminder letter will be sent to the patient regarding the next appointment. BI-RADS CATEGORY  2: Benign. Electronically Signed    By: Sundra Engel M.D.   On: 10/27/2023 11:24    Assessment & Plan:  PAF (paroxysmal atrial fibrillation) (HCC)- She has good R/R control. -     TSH; Future  Other dietary vitamin B12 deficiency anemia -     Vitamin B12; Future -     Folate; Future  Iron deficiency anemia, unspecified iron deficiency anemia type -     IBC + Ferritin; Future -     CBC with Differential/Platelet; Future  Essential hypertension- Her BP is adequately well controlled. EKG is negative for LVH. -     Urinalysis, Routine w reflex microscopic; Future  Hyperlipidemia with target LDL less than 130 -     TSH; Future -     Lipid panel; Future -     Rosuvastatin  Calcium ; Take 1 tablet (10 mg total) by mouth daily.  Dispense: 90 tablet; Refill: 0  Anemia due to acquired thiamine  deficiency -     Vitamin B1; Future  Encounter for general adult medical examination with abnormal findings- Exam completed, labs reviewed, vaccines reviewed, cancer screenings are UTD, pt ed material was given.   Vitamin D  deficiency disease -     VITAMIN D  25 Hydroxy (Vit-D Deficiency, Fractures); Future -     Vitamin D -3; Take 2 capsules (2,000 Units total) by mouth daily.  Dispense: 180 capsule; Refill: 1  Deficiency anemia -     Zinc ; Future  DOE (dyspnea on exertion)- Will evaluate with a CA CT scan -     Troponin I (High Sensitivity); Future -     Brain natriuretic peptide; Future -     Vitamin B12; Future -     Folate; Future -     EKG 12-Lead  Precordial pain -     Troponin I (High Sensitivity); Future -     Brain natriuretic peptide; Future  Gastroesophageal reflux disease with esophagitis without hemorrhage -     Esomeprazole  Magnesium ; Take 1 capsule (40 mg total) by mouth daily.  Dispense: 90 capsule; Refill: 1     Follow-up: Return in about 6 months (  around 06/20/2024).  Sandra Crouch, MD

## 2023-12-20 NOTE — Patient Instructions (Signed)

## 2023-12-20 NOTE — Assessment & Plan Note (Signed)
 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    Treatment plan: Oncotype DX score 18 (distant recurrence at 9 years: 16%) Adjuvant radiation therapy 02/18/2023-04/06/2023 Adjuvant antiestrogen therapy with anastrozole  1 mg daily to start 04/05/2023 (unable to tolerate), changed to Letrozole  07/2023 switched to exemestane  January 2025 (due to arthralgias)  Exemestane  toxicities:  Breast cancer surveillance: Breast exam 12/20/2023: Benign Mammogram 10/27/2023: Benign breast density category B Bone density ordered for 03/15/2024  Return to clinic in 1 year for follow-up

## 2023-12-20 NOTE — Progress Notes (Signed)
 Patient Care Team: Arcadio Knuckles, MD as PCP - General (Internal Medicine) Boyce Byes, MD as PCP - Electrophysiology (Cardiology) Lockie Rima, MD as Consulting Physician (General Surgery) Cameron Cea, MD as Consulting Physician (Hematology and Oncology) Johna Myers, MD as Consulting Physician (Radiation Oncology)  DIAGNOSIS:  Encounter Diagnosis  Name Primary?   Malignant neoplasm of lower-inner quadrant of left breast in female, estrogen receptor positive (HCC) Yes    SUMMARY OF ONCOLOGIC HISTORY: Oncology History  Malignant neoplasm of lower-inner quadrant of left breast in female, estrogen receptor positive (HCC)  11/08/2022 Surgery   Screening mammogram detected left breast masses at 8 o'clock position: 1.1 cm: Grade 1 IDC ER 95%, PR 80%, HER2 negative 0.7 cm mass: Grade 1 IDC ER 100%, PR 90%, HER2 negative, Ki-67 10%, multiple lymph nodes 1 was biopsied and benign discordant   11/15/2022 Initial Diagnosis   Primary malignant neoplasm of lower outer quadrant of female breast, left (HCC)   11/15/2022 Cancer Staging   Staging form: Breast, AJCC 8th Edition - Clinical stage from 11/15/2022: Stage IA (cT1c, cN0(f), cM0, G1, ER+, PR+, HER2-) - Signed by Bettejane Brownie, PA-C on 11/15/2022 Stage prefix: Initial diagnosis Method of lymph node assessment: Core biopsy Histologic grading system: 3 grade system    Genetic Testing   [facility] Custom Cancer Panel+RNA was Negative. Report date is 11/29/2022.  The Custom Hereditary Cancers Panel offered by [facility] includes sequencing and/or deletion duplication testing of the following 43 genes: APC, ATM, AXIN2, BAP1, BARD1, BMPR1A, BRCA1, BRCA2, BRIP1, CDH1, CDK4, CDKN2A (p14ARF and p16INK4a only), CHEK2, CTNNA1, EPCAM (Deletion/duplication testing only), FH, GREM1 (promoter region duplication testing only), HOXB13, KIT, MBD4, MEN1, MLH1, MSH2, MSH3, MSH6, MUTYH, NF1, NHTL1, PALB2, PDGFRA, PMS2, POLD1, POLE, PTEN, RAD51C,  RAD51D, SMAD4, SMARCA4. STK11, TP53, TSC1, TSC2, and VHL.   12/21/2022 Surgery   Left lumpectomy: 2 foci IDC 1 with lobular features 1 cm and 0.9 cm grade 1, grade 2 DCIS, LCIS, margins negative, LVI present, ER 95%, PR 80%, HER2 2+ by IHC, FISH negative, Ki-67 10% 1/4 lymph nodes positive, another lymph node with micrometastasis   12/21/2022 Oncotype testing   18/16%   01/04/2023 Cancer Staging   Staging form: Breast, AJCC 8th Edition - Pathologic: Stage IA (pT1b, pN1(sn), cM0, G2, ER+, PR+, HER2-) - Signed by Cameron Cea, MD on 01/04/2023 Method of lymph node assessment: Sentinel lymph node biopsy Histologic grading system: 3 grade system   02/17/2023 - 04/06/2023 Radiation Therapy   Plan Name: Breast_L_BH Site: Breast, Left Technique: 3D Mode: Photon Dose Per Fraction: 1.8 Gy Prescribed Dose (Delivered / Prescribed): 18 Gy / 18 Gy Prescribed Fxs (Delivered / Prescribed): 10 / 10   Plan Name: Breast_L_BH:1 Site: Breast, Left Technique: 3D Mode: Photon Dose Per Fraction: 1.8 Gy Prescribed Dose (Delivered / Prescribed): 32.4 Gy / 32.4 Gy Prescribed Fxs (Delivered / Prescribed): 18 / 18   Plan Name: Brst_L_Scv_BH Site: Sclav-LT Technique: 3D Mode: Photon Dose Per Fraction: 1.8 Gy Prescribed Dose (Delivered / Prescribed): 50.4 Gy / 50.4 Gy Prescribed Fxs (Delivered / Prescribed): 28 / 28   Plan Name: Brst_L_Bst_BH Site: Breast, Left Technique: 3D Mode: Photon Dose Per Fraction: 2 Gy Prescribed Dose (Delivered / Prescribed): 10 Gy / 10 Gy Prescribed Fxs (Delivered / Prescribed): 5 / 5     03/2023 -  Anti-estrogen oral therapy   Anastrozole  x 7 years     CHIEF COMPLIANT: Follow-up on anastrozole  therapy  HISTORY OF PRESENT ILLNESS:   History of Present  Illness Pamela Davenport, a patient with a history of arthritis and recent pneumonia, presents for a follow-up visit. She reports that her arthritis symptoms have improved with a new medication prescribed by Dr. Harvey Linen.  Although she still experiences occasional pain, it is significantly less severe than before and she describes it as "tolerable".  She also mentions a recent bout of pneumonia, during which imaging revealed a "ground glass" appearance in her lungs. She has a follow-up appointment with a pulmonologist scheduled in May to further investigate this finding. She occasionally experiences shortness of breath and weakness, but these symptoms resolve with rest.  Pamela Davenport also discusses her recent mammogram, which was reported as normal with a breast density of B. She has a bone density test scheduled for July. She has been vaccinated for influenza and pneumonia in the past, and has received the first dose of the shingles vaccine, although she had a negative reaction to it. She is considering whether to receive further vaccinations.     ALLERGIES:  is allergic to methylprednisolone , sulfa antibiotics, sulfonamide derivatives, and macrobid  [nitrofurantoin ].  MEDICATIONS:  Current Outpatient Medications  Medication Sig Dispense Refill   amLODipine  (NORVASC ) 10 MG tablet Take 1 tablet (10 mg total) by mouth at bedtime. 90 tablet 2   Black Pepper-Turmeric (TURMERIC PLUS BLACK PEPPER EXT PO) Take by mouth daily.     Cholecalciferol  (VITAMIN D -3) 25 MCG (1000 UT) CAPS Take by mouth.     esomeprazole  (NEXIUM ) 40 MG capsule Take 1 capsule (40 mg total) by mouth daily. 90 capsule 0   hydroxychloroquine  (PLAQUENIL ) 200 MG tablet Take 1 tablet (200 mg total) by mouth 2 (two) times daily. 180 tablet 0   hydrOXYzine  (VISTARIL ) 50 MG capsule Take 1 capsule (50 mg total) by mouth 3 (three) times daily as needed. (Patient not taking: Reported on 12/20/2023) 60 capsule 2   irbesartan  (AVAPRO ) 300 MG tablet Take 1 tablet (300 mg total) by mouth daily. 90 tablet 2   leflunomide  (ARAVA ) 20 MG tablet Take 1 tablet (20 mg total) by mouth daily. 90 tablet 0   nadolol  (CORGARD ) 20 MG tablet Take 1 tablet (20 mg total) by mouth  daily. 90 tablet 2   rivaroxaban  (XARELTO ) 20 MG TABS tablet Take 1 tablet (20 mg total) by mouth daily with supper. 90 tablet 1   solifenacin (VESICARE) 5 MG tablet Take 5 mg by mouth daily.     TART CHERRY PO Take 2 tablets by mouth daily.     thiamine  (VITAMIN B-1) 50 MG tablet Take 1 tablet (50 mg total) by mouth daily. 90 tablet 1   ALPRAZolam  (XANAX ) 0.5 MG tablet Take 1 tablet (0.5 mg total) by mouth 2 (two) times daily. (Patient not taking: Reported on 12/20/2023) 60 tablet 2   exemestane  (AROMASIN ) 25 MG tablet Take 1 tablet (25 mg total) by mouth daily after breakfast. 90 tablet 3   flecainide  (TAMBOCOR ) 150 MG tablet Take 2 tablets by mouth at the onset of AFIB (Patient not taking: Reported on 12/20/2023) 30 tablet 3   nystatin -triamcinolone  ointment (MYCOLOG) Apply to affected areas two times a day (Patient not taking: Reported on 12/20/2023) 20 g 2   No current facility-administered medications for this visit.    PHYSICAL EXAMINATION: ECOG PERFORMANCE STATUS: 1 - Symptomatic but completely ambulatory  Vitals:   12/20/23 1100  BP: 131/69  Pulse: 77  Resp: 18  Temp: (!) 97.5 F (36.4 C)  SpO2: 100%   Filed Weights   12/20/23 1100  Weight: 255 lb 6.4 oz (115.8 kg)      LABORATORY DATA:  I have reviewed the data as listed    Latest Ref Rng & Units 09/19/2023   11:51 AM 08/22/2023   11:46 AM 08/02/2023    3:14 PM  CMP  Glucose 65 - 99 mg/dL 90  045  409   BUN 7 - 25 mg/dL 13  17  22    Creatinine 0.50 - 1.05 mg/dL 8.11  9.14  7.82   Sodium 135 - 146 mmol/L 140  139  139   Potassium 3.5 - 5.3 mmol/L 4.2  4.2  4.7   Chloride 98 - 110 mmol/L 108  104  105   CO2 20 - 32 mmol/L 23  23  24    Calcium  8.6 - 10.4 mg/dL 9.8  9.6  9.6   Total Protein 6.1 - 8.1 g/dL 7.1  7.6  7.5   Total Bilirubin 0.2 - 1.2 mg/dL 0.3  0.4  0.4   AST 10 - 35 U/L 19  19  16    ALT 6 - 29 U/L 11  15  19      Lab Results  Component Value Date   WBC 6.4 09/19/2023   HGB 11.6 (L) 09/19/2023    HCT 36.0 09/19/2023   MCV 81.6 09/19/2023   PLT 283 09/19/2023   NEUTROABS 5,280 09/19/2023    ASSESSMENT & PLAN:  Malignant neoplasm of lower-inner quadrant of left breast in female, estrogen receptor positive (HCC) 12/21/2022:Left lumpectomy: 2 foci of grade 1IDC one with lobular features 1 cm and 0.9 cm , int grade DCIS, LCIS, margins negative, LVI present, ER 95%, PR 80%, HER2 2+ by IHC, FISH negative, Ki-67 10% 1/4 lymph nodes positive for macrometastases and 1 additional lymph node positive for micrometastases.    Treatment plan: Oncotype DX score 18 (distant recurrence at 9 years: 16%) Adjuvant radiation therapy 02/18/2023-04/06/2023 Adjuvant antiestrogen therapy with anastrozole  1 mg daily to start 04/05/2023 (unable to tolerate), changed to Letrozole  07/2023 switched to exemestane  January 2025 (due to arthralgias)  Exemestane  toxicities: Significantly improved muscle aches and pains compared to previous antiestrogen therapies.  She still has aches and pains but they are more manageable.  Breast cancer surveillance: Breast exam 12/20/2023: Benign Mammogram 10/27/2023: Benign breast density category B Bone density ordered for 03/15/2024  Return to clinic in 1 year for follow-up      No orders of the defined types were placed in this encounter.  The patient has a good understanding of the overall plan. she agrees with it. she will call with any problems that may develop before the next visit here. Total time spent: 30 mins including face to face time and time spent for planning, charting and co-ordination of care   Margert Sheerer, MD 12/20/23

## 2023-12-21 ENCOUNTER — Encounter: Payer: Self-pay | Admitting: Internal Medicine

## 2023-12-21 ENCOUNTER — Telehealth: Payer: Self-pay | Admitting: Hematology and Oncology

## 2023-12-21 NOTE — Telephone Encounter (Signed)
 Left vm about scheduled appt date and time.

## 2023-12-23 LAB — ZINC: Zinc: 71 ug/dL (ref 60–130)

## 2023-12-23 LAB — VITAMIN B1: Vitamin B1 (Thiamine): 57 nmol/L — ABNORMAL HIGH (ref 8–30)

## 2024-01-05 ENCOUNTER — Ambulatory Visit: Admitting: Pulmonary Disease

## 2024-01-05 ENCOUNTER — Encounter: Payer: Self-pay | Admitting: Pulmonary Disease

## 2024-01-05 VITALS — BP 132/82 | HR 82 | Ht 69.0 in | Wt 259.8 lb

## 2024-01-05 DIAGNOSIS — R9389 Abnormal findings on diagnostic imaging of other specified body structures: Secondary | ICD-10-CM

## 2024-01-05 DIAGNOSIS — R0609 Other forms of dyspnea: Secondary | ICD-10-CM | POA: Diagnosis not present

## 2024-01-05 DIAGNOSIS — Z87891 Personal history of nicotine dependence: Secondary | ICD-10-CM

## 2024-01-05 NOTE — Progress Notes (Signed)
 @Patient  ID: Pamela Davenport, female    DOB: [birth year], 68 y.o.   MRN: [id number]  Chief Complaint  Patient presents with   Consult    Referring provider: Stanley Earls*  HPI:   68 y.o. woman history of breast cancer status postlumpectomy, radiation, currently on aromatase inhibitor number seen for dyspnea on exertion and abnormal CT scan.  Multiple oncology notes reviewed.  Patient became ill 07/2023.  CT scan revealed dense left upper lobe infiltrate concerning for pneumonia on my review of interpretation, rest of lung clear.  She was treated with Augmentin  and azithromycin .  ED note reviewed.  Follow-up with her oncology doctor repeat CT scan was obtained 08/2023 to ensure resolution, that showed marked decrease in size of infiltrate left upper lobe with residual peripheral infiltrate likely developing scar as well as scattered groundglass opacities linear well-circumscribed scattered throughout on my review interpretation.  She had no significant symptoms at that time.  No cough fever chills etc.  She continues to deny cough fever chills etc.  She does have dyspnea on exertion.  This is present with radiation therapy.  Back in July 2024.  She been short breath since.  Several bit of chest discomfort on the left side or in the middle of her chest sometimes.  Not reliably reproduced with exertion.  Comes and goes.  She was started on anastrozole  03/2023 but developed adverse reaction, side effects that were not tolerable.  She has been on letrozole  08/19/2023 through 09/19/2023, the latter end of that timeframe is when the CT scan in January 2025 was obtained.  This is not for similar reasons.  Currently on exemestane .  She denies any history of asthma.  Minimal smoking history.  Does have hayfever seasonal allergies but that usually gets better on its own.  Questionaires / Pulmonary Flowsheets:   ACT:      No data to display          MMRC:     No data to display           Epworth:      No data to display          Tests:   FENO:  No results found for: "NITRICOXIDE"  PFT:     No data to display          WALK:      No data to display          Imaging: Personally reviewed and as per EMR and discussion in this note No results found.  Lab Results: Personally reviewed CBC    Component Value Date/Time   WBC 5.8 12/20/2023 1549   RBC 4.58 12/20/2023 1549   HGB 12.0 12/20/2023 1549   HGB 11.1 (L) 11/17/2022 1201   HGB 12.4 03/30/2017 0952   HCT 37.0 12/20/2023 1549   HCT 36.6 03/30/2017 0952   PLT 261.0 12/20/2023 1549   PLT 275 11/17/2022 1201   PLT 258 03/30/2017 0952   MCV 80.7 12/20/2023 1549   MCV 80 03/30/2017 0952   MCH 26.3 (L) 09/19/2023 1151   MCHC 32.4 12/20/2023 1549   RDW 14.5 12/20/2023 1549   RDW 13.2 03/30/2017 0952   LYMPHSABS 0.7 12/20/2023 1549   LYMPHSABS 1.6 03/30/2017 0952   MONOABS 0.3 12/20/2023 1549   EOSABS 0.2 12/20/2023 1549   EOSABS 0.1 03/30/2017 0952   BASOSABS 0.0 12/20/2023 1549   BASOSABS 0.0 03/30/2017 4098    BMET    Component Value Date/Time  NA 140 09/19/2023 1151   NA 138 02/09/2023 1048   K 4.2 09/19/2023 1151   CL 108 09/19/2023 1151   CO2 23 09/19/2023 1151   GLUCOSE 90 09/19/2023 1151   BUN 13 09/19/2023 1151   BUN 17 02/09/2023 1048   CREATININE 1.10 (H) 09/19/2023 1151   CALCIUM  9.8 09/19/2023 1151   GFRNONAA 44 (L) 07/17/2023 1249   GFRNONAA 46 (L) 11/17/2022 1201   GFRNONAA 62 11/13/2020 1127   GFRAA 72 11/13/2020 1127    BNP No results found for: "BNP"  ProBNP    Component Value Date/Time   PROBNP 12.0 12/20/2023 1549    Specialty Problems       Pulmonary Problems   DOE (dyspnea on exertion)    Allergies  Allergen Reactions   Methylprednisolone      REACTION: Atrial fibrillation at end of medrol  dose pack   Sulfa Antibiotics Hives   Sulfonamide Derivatives Hives   Macrobid  [Nitrofurantoin ]     Muscle tightening and cramps      Immunization History  Administered Date(s) Administered   Influenza,inj,Quad PF,6+ Mos 06/14/2019   Influenza-Unspecified 05/05/2021   PFIZER(Purple Top)SARS-COV-2 Vaccination 11/08/2019, 11/30/2019, 08/19/2020   PNEUMOCOCCAL CONJUGATE-20 08/26/2021   Tdap 02/03/2015   Zoster Recombinant(Shingrix) 08/26/2021    Past Medical History:  Diagnosis Date   Atypical chest pain    normal myoview  11/11/10   Blood transfusion without reported diagnosis    Cancer (HCC)    DVT (deep venous thrombosis) (HCC)    recurrent; chronically anticoagulated with coumadin    Dysrhythmia    Family history of anesthesia complication    Sister had decrease in respirations after surgery   Heart murmur    Herniated lumbar intervertebral disc    Hypertension    Paroxysmal atrial fibrillation (HCC)    SINCE AGE 40    PE (pulmonary embolism)    chronically anticoagulated with coumadin    Pneumonia 07/20/2012   PONV (postoperative nausea and vomiting)    Rheumatoid arthritis (HCC)    Sickle cell trait (HCC)    Sinus congestion 08/14/2018   C/O NASAL/CHEST  CONGESTION, COUGHING WITH PHLEGM, PER PATIENT , SHE IS TO BEGIN TAKING AUGMENTIN  DOSE PACK TODAY;     Strep throat at age 52   The patient reports a severe strep throat infection at age 62 and   is not clear as to whether or not she may have had rheumatic fever.   Symptomatic premature ventricular contractions    improved s/p ablation   Urgency of urination    Uterine bleeding 2008   Uterine artery embolization in 2008 for uterine bleeding.     Tobacco History: Social History   Tobacco Use  Smoking Status Former   Current packs/day: 0.00   Average packs/day: 1 pack/day for 2.0 years (2.0 ttl pk-yrs)   Types: Cigarettes   Start date: 02/12/1977   Quit date: 02/13/1979   Years since quitting: 44.9   Passive exposure: Never  Smokeless Tobacco Never   Counseling given: Not Answered   Continue to not smoke  Outpatient Encounter  Medications as of 01/05/2024  Medication Sig   ALPRAZolam  (XANAX ) 0.5 MG tablet Take 1 tablet (0.5 mg total) by mouth 2 (two) times daily. (Patient taking differently: Take 0.5 mg by mouth as needed.)   amLODipine  (NORVASC ) 10 MG tablet Take 1 tablet (10 mg total) by mouth at bedtime.   Black Pepper-Turmeric (TURMERIC PLUS BLACK PEPPER EXT PO) Take by mouth daily.   Cholecalciferol  (VITAMIN D -3) 25 MCG (  1000 UT) CAPS Take 2 capsules (2,000 Units total) by mouth daily.   esomeprazole  (NEXIUM ) 40 MG capsule Take 1 capsule (40 mg total) by mouth daily.   exemestane  (AROMASIN ) 25 MG tablet Take 1 tablet (25 mg total) by mouth daily after breakfast.   flecainide  (TAMBOCOR ) 150 MG tablet Take 2 tablets by mouth at the onset of AFIB   hydroxychloroquine  (PLAQUENIL ) 200 MG tablet Take 1 tablet (200 mg total) by mouth 2 (two) times daily.   hydrOXYzine  (VISTARIL ) 50 MG capsule Take 1 capsule (50 mg total) by mouth 3 (three) times daily as needed.   irbesartan  (AVAPRO ) 300 MG tablet Take 1 tablet (300 mg total) by mouth daily.   leflunomide  (ARAVA ) 20 MG tablet Take 1 tablet (20 mg total) by mouth daily.   nadolol  (CORGARD ) 20 MG tablet Take 1 tablet (20 mg total) by mouth daily.   nystatin -triamcinolone  ointment (MYCOLOG) Apply to affected areas two times a day (Patient taking differently: Apply 1 Application topically as needed.)   rivaroxaban  (XARELTO ) 20 MG TABS tablet Take 1 tablet (20 mg total) by mouth daily with supper.   rosuvastatin  (CRESTOR ) 10 MG tablet Take 1 tablet (10 mg total) by mouth daily.   solifenacin (VESICARE) 5 MG tablet Take 5 mg by mouth daily.   TART CHERRY PO Take 2 tablets by mouth daily.   thiamine  (VITAMIN B-1) 50 MG tablet Take 1 tablet (50 mg total) by mouth daily.   No facility-administered encounter medications on file as of 01/05/2024.     Review of Systems  Review of Systems  No chest pain with exertion.  No orthopnea or PND.  Comprehensive review of systems  otherwise negative. Physical Exam  BP 132/82 (BP Location: Right Arm, Patient Position: Sitting, Cuff Size: Large)   Pulse 82   Ht 5\' 9"  (1.753 m)   Wt 259 lb 12.8 oz (117.8 kg)   SpO2 97%   BMI 38.37 kg/m   Wt Readings from Last 5 Encounters:  01/05/24 259 lb 12.8 oz (117.8 kg)  12/20/23 254 lb 12.8 oz (115.6 kg)  12/20/23 255 lb 6.4 oz (115.8 kg)  11/10/23 254 lb 12.8 oz (115.6 kg)  09/19/23 258 lb 6.4 oz (117.2 kg)    BMI Readings from Last 5 Encounters:  01/05/24 38.37 kg/m  12/20/23 37.63 kg/m  12/20/23 37.72 kg/m  11/10/23 37.63 kg/m  09/19/23 38.16 kg/m     Physical Exam General: Sitting in chair, no acute distress Eyes: EOMI, no icterus Neck: Supple, no JVP Pulmonary: Clear, normal work of breathing, good excursion Cardiovascular: Warm Abdomen: Nondistended MSK: No synovitis, no joint effusion Neuro: Normal gait, no weakness Psych: Normal mood, full affect   Assessment & Plan:   Dyspnea on exertion: Really new and not an issue until after radiation therapy.  Suspect deconditioning after all she has been through in the last year, decreased activity.  Lung parenchyma historically clear.  Denies history of asthma.  She does have atopic symptoms so asthma is possible.  PFTs ordered for further evaluation.  Groundglass opacities: Scattered throughout the lungs 08/2023, not present 07/2023.  Possible residual inflammation from healing pneumonia versus organizing pneumonia as positive by radiologist.  There are reports organize pneumonias with aromatase inhibitors of which she has been on, now on her third.  She was on letrozole  07/2023 and taking it at the time of her CT scan.  Other than dyspnea as above she has no infectious symptoms nor symptoms of organized pneumonia such as cough  sputum production etc.  Repeat CT scan without contrast, if these lesions persist consider biopsy.  If they are gone, nothing further to worry about.  Return in about 6 weeks (around  02/16/2024) for f/u Dr. Marygrace Snellen, after PFT, after CT scan.   Guerry Leek, MD 01/05/2024   This appointment required 61 minutes of patient care (this includes precharting, chart review, review of results, face-to-face care, etc.).

## 2024-01-05 NOTE — Patient Instructions (Signed)
 Nice to meet you  Lets repeat a CT scan to see what is going on, hopefully those spots or groundglass opacities have gone away.  If not we can discuss next steps.  My suspicion is that these groundglass opacities were related to a drug or prescription medication.  To further evaluate your shortness of breath, I will order pulmonary function test.  Distortedly your lungs are clear on CT scan which is great news I suspect they will be reassuring but lets just make sure.  I wonder if some of the discomfort you feel in your chest or difficulty getting a deep breath could be related to ongoing healing from the pneumonia in the left upper lobe, I suspect this will lead to some scarring which could cause some symptoms like you are feeling.  This may get better over time.  No new medications for now  Return to clinic in 6 to 8 weeks with Dr. Marygrace Snellen after CT scan, pulmonary function test scheduled in interim or the same day with follow-up visit with me afterwards

## 2024-01-06 ENCOUNTER — Other Ambulatory Visit: Payer: Self-pay | Admitting: Internal Medicine

## 2024-01-06 DIAGNOSIS — D539 Nutritional anemia, unspecified: Secondary | ICD-10-CM

## 2024-01-06 DIAGNOSIS — I10 Essential (primary) hypertension: Secondary | ICD-10-CM

## 2024-01-06 DIAGNOSIS — D513 Other dietary vitamin B12 deficiency anemia: Secondary | ICD-10-CM

## 2024-01-06 DIAGNOSIS — R739 Hyperglycemia, unspecified: Secondary | ICD-10-CM | POA: Insufficient documentation

## 2024-01-06 DIAGNOSIS — E785 Hyperlipidemia, unspecified: Secondary | ICD-10-CM

## 2024-01-09 ENCOUNTER — Other Ambulatory Visit (INDEPENDENT_AMBULATORY_CARE_PROVIDER_SITE_OTHER)

## 2024-01-09 ENCOUNTER — Telehealth: Payer: Self-pay | Admitting: *Deleted

## 2024-01-09 DIAGNOSIS — D539 Nutritional anemia, unspecified: Secondary | ICD-10-CM | POA: Diagnosis not present

## 2024-01-09 DIAGNOSIS — I10 Essential (primary) hypertension: Secondary | ICD-10-CM

## 2024-01-09 DIAGNOSIS — R739 Hyperglycemia, unspecified: Secondary | ICD-10-CM

## 2024-01-09 DIAGNOSIS — D513 Other dietary vitamin B12 deficiency anemia: Secondary | ICD-10-CM

## 2024-01-09 DIAGNOSIS — E785 Hyperlipidemia, unspecified: Secondary | ICD-10-CM

## 2024-01-09 LAB — HEMOGLOBIN A1C: Hgb A1c MFr Bld: 5.5 % (ref 4.6–6.5)

## 2024-01-09 LAB — BASIC METABOLIC PANEL WITH GFR
BUN: 14 mg/dL (ref 6–23)
CO2: 26 meq/L (ref 19–32)
Calcium: 9.4 mg/dL (ref 8.4–10.5)
Chloride: 105 meq/L (ref 96–112)
Creatinine, Ser: 1.19 mg/dL (ref 0.40–1.20)
GFR: 47.24 mL/min — ABNORMAL LOW (ref >60.00)
Glucose, Bld: 94 mg/dL (ref 70–99)
Potassium: 4 meq/L (ref 3.5–5.1)
Sodium: 140 meq/L (ref 135–145)

## 2024-01-09 LAB — HEPATIC FUNCTION PANEL
ALT: 10 U/L (ref 0–35)
AST: 16 U/L (ref 0–37)
Albumin: 4 g/dL (ref 3.5–5.2)
Alkaline Phosphatase: 75 U/L (ref 39–117)
Bilirubin, Direct: 0.1 mg/dL (ref 0.0–0.3)
Total Bilirubin: 0.4 mg/dL (ref 0.2–1.2)
Total Protein: 7.5 g/dL (ref 6.0–8.3)

## 2024-01-09 NOTE — Telephone Encounter (Signed)
 Patient contacted the office stating she was having trouble getting her Arava  from [facility].   Reached out to [facility]. They advised the prescription had been cancelled but they are not sure why. They were able to reactivated the prescription and advised it should go out to the patient by end of business day today. Patient advised.

## 2024-01-12 LAB — METHYLMALONIC ACID, SERUM: Methylmalonic Acid, Quant: 283 nmol/L (ref 69–390)

## 2024-01-13 ENCOUNTER — Ambulatory Visit: Payer: Self-pay | Admitting: Internal Medicine

## 2024-01-16 ENCOUNTER — Ambulatory Visit: Payer: Self-pay

## 2024-01-16 NOTE — Telephone Encounter (Signed)
  Chief Complaint: tingling in feet Symptoms: burning and tingling in feet Frequency: x 4 weeks Pertinent Negatives: Patient denies swelling Disposition: [] ED /[] Urgent Care (no appt availability in office) / [x] Appointment(In office/virtual)/ []  [facility]/ [] Home Care/ [] Refused Recommended Disposition /[] [facility]/ []  Follow-up with PCP Additional Notes: pt states that she has been experiencing burning and tingling in her feet for about 4 weeks that was coming and going but has gotten progressively worse.  States that its on the ball of her foot and across her toes.  States top of her foot feels like its burning and worse at night.  Pt states that she has been using tylenol  arthritis and cream on her feet.   Copied from CRM [id number]. Topic: Clinical - Red Word Triage >> Jan 16, 2024  9:27 AM Adonis Hoot wrote: Red Word that prompted transfer to Nurse Triage: tingling and burning in feet x 4 weeks Reason for Disposition  [1] Numbness or tingling in one or both feet AND [2] is a chronic symptom (recurrent or ongoing AND present > 4 weeks)  Answer Assessment - Initial Assessment Questions 1. SYMPTOM: "What is the main symptom you are concerned about?" (e.g., weakness, numbness)     Tingling and burning 2. ONSET: "When did this start?" (minutes, hours, days; while sleeping)     4 weeks ago 3. LAST NORMAL: "When was the last time you (the patient) were normal (no symptoms)?"     4 weeks 4. PATTERN "Does this come and go, or has it been constant since it started?"  "Is it present now?"     Comes and goes 5. CARDIAC SYMPTOMS: "Have you had any of the following symptoms: chest pain, difficulty breathing, palpitations?"     no 6. NEUROLOGIC SYMPTOMS: "Have you had any of the following symptoms: headache, dizziness, vision loss, double vision, changes in speech, unsteady on your feet?"     no 7. OTHER SYMPTOMS: "Do you have any other symptoms?"     no  Protocols used:  Neurologic Deficit-A-AH

## 2024-01-16 NOTE — Progress Notes (Signed)
 Subjective:    Patient ID: Pamela Davenport, female    DOB: 05/23/1956, 68 y.o.   MRN: [id number]      HPI Searra is here for  Chief Complaint  Patient presents with   Tingling    Tingling and burning bottom of feet    Tingling, burning bottom of feet - started 4 weeks ago with the left foot then started in the right foot.  It Nareh Matzke and Ingels on the distal dorsal and plantar aspect of her feet -  it also feels like she has sand inbetween her toes.    Sometimes tingling in her fingers- but that resolves when she moves the fingers.   Taking tylenol  arthritis gel has helped a little bit, but it is affecting her sleep..  Had  flare of RA last year after going on  anastrozole .  Then placed no arava  in addition to the hydroxychloroquine  which she was already on.  Then changed to letrozole  - still had bad RA symptoms. . Then changed to exemestane   Still having some joint pain and fatigue.  She has an area in the perineum region that is tender.  Initially it was above and now it is very tender-she thinks it has gotten a little bit better.  She was not sure if it was an inflamed or infected hair follicle.  It does burn when she urinates.  Medications and allergies reviewed with patient and updated if appropriate.  Current Outpatient Medications on File Prior to Visit  Medication Sig Dispense Refill   ALPRAZolam  (XANAX ) 0.5 MG tablet Take 1 tablet (0.5 mg total) by mouth 2 (two) times daily. (Patient taking differently: Take 0.5 mg by mouth as needed.) 60 tablet 2   amLODipine  (NORVASC ) 10 MG tablet Take 1 tablet (10 mg total) by mouth at bedtime. 90 tablet 2   Black Pepper-Turmeric (TURMERIC PLUS BLACK PEPPER EXT PO) Take by mouth daily.     Cholecalciferol  (VITAMIN D -3) 25 MCG (1000 UT) CAPS Take 2 capsules (2,000 Units total) by mouth daily. 180 capsule 1   esomeprazole  (NEXIUM ) 40 MG capsule Take 1 capsule (40 mg total) by mouth daily. 90 capsule 1   exemestane  (AROMASIN ) 25 MG  tablet Take 1 tablet (25 mg total) by mouth daily after breakfast. 90 tablet 3   flecainide  (TAMBOCOR ) 150 MG tablet Take 2 tablets by mouth at the onset of AFIB 30 tablet 3   hydroxychloroquine  (PLAQUENIL ) 200 MG tablet Take 1 tablet (200 mg total) by mouth 2 (two) times daily. 180 tablet 0   hydrOXYzine  (VISTARIL ) 50 MG capsule Take 1 capsule (50 mg total) by mouth 3 (three) times daily as needed. 60 capsule 2   irbesartan  (AVAPRO ) 300 MG tablet Take 1 tablet (300 mg total) by mouth daily. 90 tablet 2   leflunomide  (ARAVA ) 20 MG tablet Take 1 tablet (20 mg total) by mouth daily. 90 tablet 0   nadolol  (CORGARD ) 20 MG tablet Take 1 tablet (20 mg total) by mouth daily. 90 tablet 2   nystatin -triamcinolone  ointment (MYCOLOG) Apply to affected areas two times a day (Patient taking differently: Apply 1 Application topically as needed.) 20 g 2   rivaroxaban  (XARELTO ) 20 MG TABS tablet Take 1 tablet (20 mg total) by mouth daily with supper. 90 tablet 1   rosuvastatin  (CRESTOR ) 10 MG tablet Take 1 tablet (10 mg total) by mouth daily. 90 tablet 0   solifenacin (VESICARE) 5 MG tablet Take 5 mg by mouth daily.  TART CHERRY PO Take 2 tablets by mouth daily.     thiamine  (VITAMIN B-1) 50 MG tablet Take 1 tablet (50 mg total) by mouth daily. 90 tablet 1   No current facility-administered medications on file prior to visit.    Review of Systems  Constitutional:  Positive for fatigue.  Musculoskeletal:  Positive for arthralgias (better - still there).  Skin:  Negative for rash.  Neurological:  Positive for numbness (burning, tingling) and headaches (occ).       Objective:   Vitals:   01/17/24 1519  BP: 124/70  Pulse: 78  Temp: 98.2 F (36.8 C)  SpO2: 95%   BP Readings from Last 3 Encounters:  01/17/24 124/70  01/05/24 132/82  12/20/23 (!) 140/82   Wt Readings from Last 3 Encounters:  01/17/24 258 lb (117 kg)  01/05/24 259 lb 12.8 oz (117.8 kg)  12/20/23 254 lb 12.8 oz (115.6 kg)    Body mass index is 38.1 kg/m.    Physical Exam Constitutional:      General: She is not in acute distress.    Appearance: Normal appearance.  HENT:     Head: Normocephalic and atraumatic.  Eyes:     Conjunctiva/sclera: Conjunctivae normal.  Cardiovascular:     Rate and Rhythm: Normal rate and regular rhythm.     Pulses: Normal pulses.     Heart sounds: Normal heart sounds.  Pulmonary:     Effort: Pulmonary effort is normal. No respiratory distress.     Breath sounds: Normal breath sounds. No wheezing.  Musculoskeletal:     Cervical back: Neck supple.     Right lower leg: No edema.     Left lower leg: No edema.  Lymphadenopathy:     Cervical: No cervical adenopathy.  Skin:    General: Skin is warm and dry.     Findings: Lesion (Small ulcer on the left side of the lower vulva-possible folliculitis that is healing-no area of induration, no discharge) present. No rash.  Neurological:     Mental Status: She is alert.     Sensory: No sensory deficit (No decrease sensation bilateral feet).            Assessment & Plan:    Neuropathy: New Started about 4 weeks ago Bilateral distal feet-plantar and dorsal aspects of feet from ball of foot distally Burning, tingling sensation Tylenol  arthritis gel which has Zeyocaine does help some ?  Related to RA-had a severe flare last year which may have triggered vasculitis-neuropathy Will discuss with rheumatology Start gabapentin  100-300 at bedtime as needed-discussed possible side effects Referral to neurology ordered  Vulvar ulcer: Acute Has only been present for 3 days It is improving Looks like it may have been folliculitis or infected hair follicle that has become an ulcer, which is why it is burning when she urinates No obvious abscess or infection at this time, but she will monitor closely and if it is getting bigger or if there is more tenderness she will let me know and I will send in an antibiotic Expect the ulcer  to slowly heal-can you try Desitin to help with some of the pain with urination

## 2024-01-17 ENCOUNTER — Ambulatory Visit (INDEPENDENT_AMBULATORY_CARE_PROVIDER_SITE_OTHER): Admitting: Internal Medicine

## 2024-01-17 ENCOUNTER — Encounter: Payer: Self-pay | Admitting: Internal Medicine

## 2024-01-17 VITALS — BP 124/70 | HR 78 | Temp 98.2°F | Ht 69.0 in | Wt 258.0 lb

## 2024-01-17 DIAGNOSIS — N766 Ulceration of vulva: Secondary | ICD-10-CM | POA: Diagnosis not present

## 2024-01-17 DIAGNOSIS — G629 Polyneuropathy, unspecified: Secondary | ICD-10-CM | POA: Diagnosis not present

## 2024-01-17 MED ORDER — GABAPENTIN 100 MG PO CAPS: 100.0000 mg | ORAL_CAPSULE | Freq: Every evening | ORAL | 5 refills | Status: DC | PRN

## 2024-01-17 NOTE — Patient Instructions (Addendum)
       Medications changes include :   gabapentin  100-300 mg at night as needed    A referral was ordered neurology and someone will call you to schedule an appointment.    Contact Dr Alvira Josephs

## 2024-01-19 ENCOUNTER — Encounter: Payer: Self-pay | Admitting: Pulmonary Disease

## 2024-01-19 DIAGNOSIS — H35412 Lattice degeneration of retina, left eye: Secondary | ICD-10-CM | POA: Diagnosis not present

## 2024-01-19 DIAGNOSIS — H2513 Age-related nuclear cataract, bilateral: Secondary | ICD-10-CM | POA: Diagnosis not present

## 2024-01-19 DIAGNOSIS — H04123 Dry eye syndrome of bilateral lacrimal glands: Secondary | ICD-10-CM | POA: Diagnosis not present

## 2024-01-19 DIAGNOSIS — Z79899 Other long term (current) drug therapy: Secondary | ICD-10-CM | POA: Diagnosis not present

## 2024-01-20 ENCOUNTER — Encounter: Payer: Self-pay | Admitting: Neurology

## 2024-01-20 ENCOUNTER — Ambulatory Visit
Admission: RE | Admit: 2024-01-20 | Discharge: 2024-01-20 | Disposition: A | Discharge status: Home / Self Care | Source: Ambulatory Visit | Attending: Pulmonary Disease | Admitting: Pulmonary Disease

## 2024-01-20 ENCOUNTER — Other Ambulatory Visit: Payer: Self-pay | Admitting: Cardiology

## 2024-01-20 DIAGNOSIS — K449 Diaphragmatic hernia without obstruction or gangrene: Secondary | ICD-10-CM | POA: Diagnosis not present

## 2024-01-20 DIAGNOSIS — J984 Other disorders of lung: Secondary | ICD-10-CM | POA: Diagnosis not present

## 2024-01-20 DIAGNOSIS — R0609 Other forms of dyspnea: Secondary | ICD-10-CM | POA: Diagnosis not present

## 2024-01-20 DIAGNOSIS — R9389 Abnormal findings on diagnostic imaging of other specified body structures: Secondary | ICD-10-CM

## 2024-01-20 DIAGNOSIS — J8410 Pulmonary fibrosis, unspecified: Secondary | ICD-10-CM | POA: Diagnosis not present

## 2024-01-24 ENCOUNTER — Other Ambulatory Visit (HOSPITAL_COMMUNITY): Payer: Self-pay | Admitting: Emergency Medicine

## 2024-01-24 ENCOUNTER — Ambulatory Visit: Attending: Rheumatology | Admitting: Rheumatology

## 2024-01-24 ENCOUNTER — Encounter (HOSPITAL_COMMUNITY): Payer: Self-pay

## 2024-01-24 ENCOUNTER — Ambulatory Visit: Payer: Medicare HMO | Attending: Surgery

## 2024-01-24 ENCOUNTER — Encounter: Payer: Self-pay | Admitting: Rheumatology

## 2024-01-24 VITALS — Wt 257.2 lb

## 2024-01-24 VITALS — BP 132/83 | HR 81 | Resp 15 | Ht 69.0 in | Wt 259.2 lb

## 2024-01-24 DIAGNOSIS — Z8679 Personal history of other diseases of the circulatory system: Secondary | ICD-10-CM

## 2024-01-24 DIAGNOSIS — M170 Bilateral primary osteoarthritis of knee: Secondary | ICD-10-CM

## 2024-01-24 DIAGNOSIS — R768 Other specified abnormal immunological findings in serum: Secondary | ICD-10-CM | POA: Diagnosis not present

## 2024-01-24 DIAGNOSIS — Z79899 Other long term (current) drug therapy: Secondary | ICD-10-CM | POA: Diagnosis not present

## 2024-01-24 DIAGNOSIS — Z8701 Personal history of pneumonia (recurrent): Secondary | ICD-10-CM

## 2024-01-24 DIAGNOSIS — E559 Vitamin D deficiency, unspecified: Secondary | ICD-10-CM

## 2024-01-24 DIAGNOSIS — M19041 Primary osteoarthritis, right hand: Secondary | ICD-10-CM

## 2024-01-24 DIAGNOSIS — E785 Hyperlipidemia, unspecified: Secondary | ICD-10-CM

## 2024-01-24 DIAGNOSIS — M25512 Pain in left shoulder: Secondary | ICD-10-CM

## 2024-01-24 DIAGNOSIS — Z862 Personal history of diseases of the blood and blood-forming organs and certain disorders involving the immune mechanism: Secondary | ICD-10-CM

## 2024-01-24 DIAGNOSIS — M19042 Primary osteoarthritis, left hand: Secondary | ICD-10-CM

## 2024-01-24 DIAGNOSIS — Z86718 Personal history of other venous thrombosis and embolism: Secondary | ICD-10-CM

## 2024-01-24 DIAGNOSIS — I10 Essential (primary) hypertension: Secondary | ICD-10-CM

## 2024-01-24 DIAGNOSIS — R7989 Other specified abnormal findings of blood chemistry: Secondary | ICD-10-CM | POA: Diagnosis not present

## 2024-01-24 DIAGNOSIS — M19072 Primary osteoarthritis, left ankle and foot: Secondary | ICD-10-CM

## 2024-01-24 DIAGNOSIS — M25511 Pain in right shoulder: Secondary | ICD-10-CM | POA: Diagnosis not present

## 2024-01-24 DIAGNOSIS — U071 COVID-19: Secondary | ICD-10-CM

## 2024-01-24 DIAGNOSIS — M19071 Primary osteoarthritis, right ankle and foot: Secondary | ICD-10-CM | POA: Diagnosis not present

## 2024-01-24 DIAGNOSIS — Z86711 Personal history of pulmonary embolism: Secondary | ICD-10-CM

## 2024-01-24 DIAGNOSIS — Z483 Aftercare following surgery for neoplasm: Secondary | ICD-10-CM | POA: Insufficient documentation

## 2024-01-24 DIAGNOSIS — R202 Paresthesia of skin: Secondary | ICD-10-CM

## 2024-01-24 DIAGNOSIS — M0579 Rheumatoid arthritis with rheumatoid factor of multiple sites without organ or systems involvement: Secondary | ICD-10-CM | POA: Diagnosis not present

## 2024-01-24 DIAGNOSIS — M654 Radial styloid tenosynovitis [de Quervain]: Secondary | ICD-10-CM

## 2024-01-24 DIAGNOSIS — I493 Ventricular premature depolarization: Secondary | ICD-10-CM

## 2024-01-24 DIAGNOSIS — G8929 Other chronic pain: Secondary | ICD-10-CM

## 2024-01-24 MED ORDER — METOPROLOL TARTRATE 100 MG PO TABS
100.0000 mg | ORAL_TABLET | Freq: Once | ORAL | 0 refills | Status: DC
Start: 2024-01-24 — End: 2024-02-02

## 2024-01-24 NOTE — Therapy (Signed)
 OUTPATIENT PHYSICAL THERAPY SOZO SCREENING NOTE   Patient Name: Pamela Davenport MRN: [id number] DOB:1956/02/19, 68 y.o., female Today's Date: 01/24/2024  PCP: Arcadio Knuckles, MD REFERRING PROVIDER: Sim Dryer, MD   PT End of Session - 01/24/24 [phone]     Visit Number 2   # unchanged due to screen only   PT Start Time 0935    PT Stop Time 0939    PT Time Calculation (min) 4 min    Activity Tolerance Patient tolerated treatment well    Behavior During Therapy [facility] for tasks assessed/performed             Past Medical History:  Diagnosis Date   Atypical chest pain    normal myoview  11/11/10   Blood transfusion without reported diagnosis    Cancer (HCC)    DVT (deep venous thrombosis) (HCC)    recurrent; chronically anticoagulated with coumadin    Dysrhythmia    Family history of anesthesia complication    Sister had decrease in respirations after surgery   Heart murmur    Herniated lumbar intervertebral disc    Hypertension    Paroxysmal atrial fibrillation (HCC)    SINCE AGE 52    PE (pulmonary embolism)    chronically anticoagulated with coumadin    Pneumonia 07/20/2012   PONV (postoperative nausea and vomiting)    Rheumatoid arthritis (HCC)    Sickle cell trait (HCC)    Sinus congestion 08/14/2018   C/O NASAL/CHEST  CONGESTION, COUGHING WITH PHLEGM, PER PATIENT , SHE IS TO BEGIN TAKING AUGMENTIN  DOSE PACK TODAY;     Strep throat at age 30   The patient reports a severe strep throat infection at age 34 and   is not clear as to whether or not she may have had rheumatic fever.   Symptomatic premature ventricular contractions    improved s/p ablation   Urgency of urination    Uterine bleeding 2008   Uterine artery embolization in 2008 for uterine bleeding.    Past Surgical History:  Procedure Laterality Date   BREAST BIOPSY Left 11/08/2022   US  LT BREAST BX W LOC DEV EA ADD LESION IMG BX SPEC US  GUIDE 11/08/2022 [facility]   BREAST BIOPSY Left 11/08/2022    US  LT BREAST BX W LOC DEV 1ST LESION IMG BX SPEC US  GUIDE 11/08/2022 [facility]   BREAST BIOPSY Left 12/17/2022   US  LT RADIOACTIVE SEED LOC 12/17/2022 [facility]   BREAST BIOPSY Left 12/17/2022   US  LT RADIOACTIVE SEED EA ADD LESION 12/17/2022 [facility]   BREAST BIOPSY Left 12/17/2022   US  LT RADIOACTIVE SEED EA ADD LESION 12/17/2022 [facility]   BREAST LUMPECTOMY WITH RADIOACTIVE SEED AND SENTINEL LYMPH NODE BIOPSY Left 12/21/2022   Procedure: LEFT BREAST BRACKETED SEED LUMPECTOMY AND LEFT SENTINEL LYMPH NODE MAPPING;  Surgeon: Sim Dryer, MD;  Location: [facility];  Service: General;  Laterality: Left;  GEN & PEC BLOCK   COLONOSCOPY W/ POLYPECTOMY     Diagnostic D&C hysteroscopy and Novasure ablation  03/17/2004   DILATION AND CURETTAGE OF UTERUS  12/17/2011   Procedure: DILATATION AND CURETTAGE;  Surgeon: Heide Livings, MD;  Location: [facility];  Service: Gynecology;  Laterality: N/A;  With Attempted Hydrothermal Ablation   LUMBAR LAMINECTOMY/DECOMPRESSION MICRODISCECTOMY Left 02/21/2013   Procedure: LUMBAR LAMINECTOMY/DECOMPRESSION MICRODISCECTOMY 1 LEVEL;  Surgeon: Pasty Bongo, MD;  Location: [facility];  Service: Neurosurgery;  Laterality: Left;  LEFT Lumbar Three-Four Laminotomy foraminotomy microdiskectomy   MANDIBLE SURGERY  under bite   PVC Ablation  2010   ROBOTIC ASSISTED TOTAL HYSTERECTOMY WITH BILATERAL SALPINGO OOPHERECTOMY Bilateral 08/21/2018   Procedure: XI ROBOTIC ASSISTED TOTAL HYSTERECTOMY WITH BILATERAL SALPINGO OOPHORECTOMY;  Surgeon: Alphonso Aschoff, MD;  Location: [facility];  Service: Gynecology;  Laterality: Bilateral;   SPINE SURGERY     TONSILLECTOMY     UTERINE ARTERY EMBOLIZATION  2008   for uterine bleeding   VASCULAR SURGERY     laser of left leg   Patient Active Problem List   Diagnosis Date Noted   Chronic hyperglycemia 01/06/2024   Deficiency anemia 12/20/2023   Malignant neoplasm  of lower-inner quadrant of left breast in female, estrogen receptor positive (HCC) 11/15/2022   Anemia due to acquired thiamine  deficiency 05/07/2022   Gastroesophageal reflux disease with esophagitis without hemorrhage 05/04/2022   Esophageal dysphagia 05/04/2022   OAB (overactive bladder) 12/31/2021   Encounter for general adult medical examination with abnormal findings 08/26/2021   PAF (paroxysmal atrial fibrillation) (HCC) 08/26/2021   GERD without esophagitis 10/04/2019   Vitamin D  deficiency disease 09/25/2019   Hypercoagulable state (HCC) 09/24/2019   Iron deficiency anemia 12/07/2018   Other dietary vitamin B12 deficiency anemia 12/07/2018   Visit for screening mammogram 04/18/2017   Hyperlipidemia with target LDL less than 130 10/19/2016   Essential hypertension 01/13/2015   Chronic pulmonary embolism (HCC) 12/05/2008    REFERRING DIAG: left breast cancer at risk for lymphedema  THERAPY DIAG: Aftercare following surgery for neoplasm  PERTINENT HISTORY: Patient was diagnosed on 11/09/2022 with left grade 1 invasive ductal carcinoma breast cancer. She underwent a left lumpectomy and sentinel node biopsy on 12/21/2022. It is ER/PR positive and HER2 negative with a Ki67 of 10%.   PRECAUTIONS: left UE Lymphedema risk, None  SUBJECTIVE: Pt returns for her 3 month L-Dex screen.   PAIN:  Are you having pain? No  SOZO SCREENING: Patient was assessed today using the SOZO machine to determine the lymphedema index score. This was compared to her baseline score. It was determined that she is NOT within the recommended range when compared to her baseline. She still has her compression sleeve from last increase so encouraged her to resume wear of this. It is recommended she return in 1 month to be reassessed. If she continues to measure outside the recommended range, physical therapy treatment will be recommended at that time and a referral requested.    L-DEX FLOWSHEETS - 01/24/24 0900        L-DEX LYMPHEDEMA SCREENING   Measurement Type Unilateral    L-DEX MEASUREMENT EXTREMITY Upper Extremity    POSITION  Standing    DOMINANT SIDE Right    At Risk Side Left    BASELINE SCORE (UNILATERAL) 1.3    L-DEX SCORE (UNILATERAL) 9.2    VALUE CHANGE (UNILAT) 7.9              Denyce Flank, PTA 01/24/2024, 9:40 AM
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
## 2024-01-24 NOTE — Patient Instructions (Signed)
 Standing Labs We placed an order today for your standing lab work.   Please have your standing labs drawn in August and every 3 months  Please have your labs drawn 2 weeks prior to your appointment so that the provider can discuss your lab results at your appointment, if possible.  Please note that you may see your imaging and lab results in MyChart before we have reviewed them. We will contact you once all results are reviewed. Please allow our office up to 72 hours to thoroughly review all of the results before contacting the office for clarification of your results.  WALK-IN LAB HOURS  Monday through Thursday from 8:00 am -12:30 pm and 1:00 pm-4:00 pm and Friday from 8:00 am-12:00 pm.  Patients with office visits requiring labs will be seen before walk-in labs.  You may encounter longer than normal wait times. Please allow additional time. Wait times may be shorter on  Monday and Thursday afternoons.  We do not book appointments for walk-in labs. We appreciate your patience and understanding with our staff.   Labs are drawn by [facility]. Please bring your co-pay at the time of your lab draw.  You may receive a bill from [facility] for your lab work.  Please note if you are on Hydroxychloroquine and and an order has been placed for a Hydroxychloroquine level,  you will need to have it drawn 4 hours or more after your last dose.  If you wish to have your labs drawn at another location, please call the office 24 hours in advance so we can fax the orders.  The office is located at [address], [address], [place], [place] [address]   If you have any questions regarding directions or hours of operation,  please call [phone].   As a reminder, please drink plenty of water prior to coming for your lab work. Thanks!   Vaccines You are taking a medication(s) that can suppress your immune system.  The following immunizations are recommended: Flu annually Covid-19  Td/Tdap (tetanus,  diphtheria, pertussis) every 10 years Pneumonia (Prevnar 15 then Pneumovax 23 at least 1 year apart.  Alternatively, can take Prevnar 20 without needing additional dose) Shingrix: 2 doses from 4 weeks to 6 months apart  Please check with your PCP to make sure you are up to date.   If you have signs or symptoms of an infection or start antibiotics: First, call your PCP for workup of your infection. Hold your medication through the infection, until you complete your antibiotics, and until symptoms resolve if you take the following: Injectable medication (Actemra, Benlysta, Cimzia, Cosentyx, Enbrel, Humira, Kevzara, Orencia, Remicade, Simponi, Stelara, Taltz, Tremfya) Methotrexate  Leflunomide  (Arava ) Mycophenolate (Cellcept) Xeljanz, Olumiant, or Rinvoq

## 2024-01-26 ENCOUNTER — Ambulatory Visit (HOSPITAL_COMMUNITY)
Admission: RE | Admit: 2024-01-26 | Discharge: 2024-01-26 | Disposition: A | Discharge status: Home / Self Care | Source: Ambulatory Visit | Attending: Cardiology | Admitting: Cardiology

## 2024-01-26 DIAGNOSIS — R072 Precordial pain: Secondary | ICD-10-CM | POA: Diagnosis not present

## 2024-01-26 DIAGNOSIS — R0609 Other forms of dyspnea: Secondary | ICD-10-CM | POA: Insufficient documentation

## 2024-01-26 MED ORDER — DILTIAZEM HCL 25 MG/5ML IV SOLN: 10.0000 mg | INTRAVENOUS | Status: DC | PRN

## 2024-01-26 MED ORDER — IOHEXOL 350 MG/ML SOLN
100.0000 mL | Freq: Once | INTRAVENOUS | Status: AC | PRN
  Administered 2024-01-26: 100 mL via INTRAVENOUS

## 2024-01-26 MED ORDER — NITROGLYCERIN 0.4 MG SL SUBL
0.8000 mg | SUBLINGUAL_TABLET | Freq: Once | SUBLINGUAL | Status: AC
  Administered 2024-01-26: 0.8 mg via SUBLINGUAL

## 2024-01-26 MED ORDER — METOPROLOL TARTRATE 5 MG/5ML IV SOLN: 10.0000 mg | Freq: Once | INTRAVENOUS | Status: DC | PRN

## 2024-01-27 ENCOUNTER — Ambulatory Visit: Payer: Self-pay | Admitting: Internal Medicine

## 2024-02-02 ENCOUNTER — Ambulatory Visit

## 2024-02-02 VITALS — Ht 69.0 in | Wt 259.0 lb

## 2024-02-02 DIAGNOSIS — Z0000 Encounter for general adult medical examination without abnormal findings: Secondary | ICD-10-CM

## 2024-02-02 NOTE — Progress Notes (Signed)
 Subjective:   Pamela Davenport is a 68 y.o. who presents for a Medicare Wellness preventive visit.  As a reminder, Annual Wellness Visits don't include a physical exam, and some assessments may be limited, especially if this visit is performed virtually. We may recommend an in-person follow-up visit with your provider if needed.  Visit Complete: Virtual I connected with  Micala G Bickhart on 02/02/24 by a audio enabled telemedicine application and verified that I am speaking with the correct person using two identifiers.  Patient Location: Home  Provider Location: Office/Clinic  I discussed the limitations of evaluation and management by telemedicine. The patient expressed understanding and agreed to proceed.  Vital Signs: Because this visit was a virtual/telehealth visit, some criteria may be missing or patient reported. Any vitals not documented were not able to be obtained and vitals that have been documented are patient reported.  VideoDeclined- This patient declined Librarian, academic. Therefore the visit was completed with audio only.  Persons Participating in Visit: Patient.  AWV Questionnaire: No: Patient Medicare AWV questionnaire was not completed prior to this visit.  Cardiac Risk Factors include: advanced age (>66men, >9 women);dyslipidemia;hypertension;obesity (BMI >30kg/m2)     Objective:     Today's Vitals   02/02/24 1017  Weight: 259 lb (117.5 kg)  Height: 5\' 9"  (1.753 m)   Body mass index is 38.25 kg/m.     02/02/2024   10:15 AM 07/17/2023   10:51 AM 01/19/2023    1:51 PM 01/04/2023    2:53 PM 12/21/2022    7:32 AM 11/17/2022    1:22 PM 09/13/2018    2:39 PM  Advanced Directives  Does Patient Have a Medical Advance Directive? Yes No No No No No No  Type of Estate agent of [place];Living will        Copy of Healthcare Power of Attorney in Chart? No - copy requested        Would patient like information on  creating a medical advance directive?  No - Patient declined  No - Patient declined No - Patient declined No - Patient declined No - Patient declined    Current Medications (verified) Outpatient Encounter Medications as of 02/02/2024  Medication Sig   ALPRAZolam  (XANAX ) 0.5 MG tablet Take 1 tablet (0.5 mg total) by mouth 2 (two) times daily. (Patient taking differently: Take 0.5 mg by mouth as needed.)   amLODipine  (NORVASC ) 10 MG tablet Take 1 tablet (10 mg total) by mouth at bedtime.   Black Pepper-Turmeric (TURMERIC PLUS BLACK PEPPER EXT PO) Take by mouth daily.   Cholecalciferol  (VITAMIN D -3) 25 MCG (1000 UT) CAPS Take 2 capsules (2,000 Units total) by mouth daily.   esomeprazole  (NEXIUM ) 40 MG capsule Take 1 capsule (40 mg total) by mouth daily.   exemestane  (AROMASIN ) 25 MG tablet Take 1 tablet (25 mg total) by mouth daily after breakfast.   flecainide  (TAMBOCOR ) 150 MG tablet Take 2 tablets by mouth at the onset of AFIB   gabapentin  (NEURONTIN ) 100 MG capsule Take 1-3 capsules (100-300 mg total) by mouth at bedtime as needed.   hydroxychloroquine  (PLAQUENIL ) 200 MG tablet Take 1 tablet (200 mg total) by mouth 2 (two) times daily.   hydrOXYzine  (VISTARIL ) 50 MG capsule Take 1 capsule (50 mg total) by mouth 3 (three) times daily as needed.   irbesartan  (AVAPRO ) 300 MG tablet Take 1 tablet by mouth once daily   leflunomide  (ARAVA ) 20 MG tablet Take 1 tablet (20 mg total)  by mouth daily.   nadolol  (CORGARD ) 20 MG tablet Take 1 tablet (20 mg total) by mouth daily.   nystatin -triamcinolone  ointment (MYCOLOG) Apply to affected areas two times a day   rivaroxaban  (XARELTO ) 20 MG TABS tablet Take 1 tablet (20 mg total) by mouth daily with supper.   rosuvastatin  (CRESTOR ) 10 MG tablet Take 1 tablet (10 mg total) by mouth daily.   solifenacin (VESICARE) 5 MG tablet Take 5 mg by mouth daily.   TART CHERRY PO Take 2 tablets by mouth daily.   thiamine  (VITAMIN B-1) 50 MG tablet Take 1 tablet (50 mg  total) by mouth daily.   [DISCONTINUED] metoprolol  tartrate (LOPRESSOR ) 100 MG tablet Take 1 tablet (100 mg total) by mouth once for 1 dose. Please take one time dose 100mg  metoprolol  tartrate 2 hr prior to cardiac CT for HR control IF HR >55bpm.   No facility-administered encounter medications on file as of 02/02/2024.    Allergies (verified) Methylprednisolone , Sulfa antibiotics, Sulfonamide derivatives, and Macrobid  [nitrofurantoin ]   History: Past Medical History:  Diagnosis Date   Atypical chest pain    normal myoview  11/11/10   Blood transfusion without reported diagnosis    Cancer (HCC)    DVT (deep venous thrombosis) (HCC)    recurrent; chronically anticoagulated with coumadin    Dysrhythmia    Family history of anesthesia complication    Sister had decrease in respirations after surgery   Heart murmur    Herniated lumbar intervertebral disc    Hypertension    Paroxysmal atrial fibrillation (HCC)    SINCE AGE 2    PE (pulmonary embolism)    chronically anticoagulated with coumadin    Pneumonia 07/20/2012   PONV (postoperative nausea and vomiting)    Rheumatoid arthritis (HCC)    Sickle cell trait (HCC)    Sinus congestion 08/14/2018   C/O NASAL/CHEST  CONGESTION, COUGHING WITH PHLEGM, PER PATIENT , SHE IS TO BEGIN TAKING AUGMENTIN  DOSE PACK TODAY;     Strep throat at age 53   The patient reports a severe strep throat infection at age 14 and   is not clear as to whether or not she may have had rheumatic fever.   Symptomatic premature ventricular contractions    improved s/p ablation   Urgency of urination    Uterine bleeding 2008   Uterine artery embolization in 2008 for uterine bleeding.    Past Surgical History:  Procedure Laterality Date   BREAST BIOPSY Left 11/08/2022   US  LT BREAST BX W LOC DEV EA ADD LESION IMG BX SPEC US  GUIDE 11/08/2022 [facility]   BREAST BIOPSY Left 11/08/2022   US  LT BREAST BX W LOC DEV 1ST LESION IMG BX SPEC US  GUIDE 11/08/2022 [facility]   BREAST BIOPSY Left 12/17/2022   US  LT RADIOACTIVE SEED LOC 12/17/2022 [facility]   BREAST BIOPSY Left 12/17/2022   US  LT RADIOACTIVE SEED EA ADD LESION 12/17/2022 [facility]   BREAST BIOPSY Left 12/17/2022   US  LT RADIOACTIVE SEED EA ADD LESION 12/17/2022 [facility]   BREAST LUMPECTOMY WITH RADIOACTIVE SEED AND SENTINEL LYMPH NODE BIOPSY Left 12/21/2022   Procedure: LEFT BREAST BRACKETED SEED LUMPECTOMY AND LEFT SENTINEL LYMPH NODE MAPPING;  Surgeon: Sim Dryer, MD;  Location: [facility];  Service: General;  Laterality: Left;  GEN & PEC BLOCK   COLONOSCOPY W/ POLYPECTOMY     Diagnostic D&C hysteroscopy and Novasure ablation  03/17/2004   DILATION AND CURETTAGE OF UTERUS  12/17/2011   Procedure:  DILATATION AND CURETTAGE;  Surgeon: Heide Livings, MD;  Location: [facility];  Service: Gynecology;  Laterality: N/A;  With Attempted Hydrothermal Ablation   LUMBAR LAMINECTOMY/DECOMPRESSION MICRODISCECTOMY Left 02/21/2013   Procedure: LUMBAR LAMINECTOMY/DECOMPRESSION MICRODISCECTOMY 1 LEVEL;  Surgeon: Pasty Bongo, MD;  Location: [facility];  Service: Neurosurgery;  Laterality: Left;  LEFT Lumbar Three-Four Laminotomy foraminotomy microdiskectomy   MANDIBLE SURGERY     under bite   PVC Ablation  2010   ROBOTIC ASSISTED TOTAL HYSTERECTOMY WITH BILATERAL SALPINGO OOPHERECTOMY Bilateral 08/21/2018   Procedure: XI ROBOTIC ASSISTED TOTAL HYSTERECTOMY WITH BILATERAL SALPINGO OOPHORECTOMY;  Surgeon: Alphonso Aschoff, MD;  Location: [facility];  Service: Gynecology;  Laterality: Bilateral;   SPINE SURGERY     TONSILLECTOMY     UTERINE ARTERY EMBOLIZATION  2008   for uterine bleeding   VASCULAR SURGERY     laser of left leg   Family History  Problem Relation Age of Onset   Hypertension Mother    Diabetes Mother    Uterine cancer Sister 25   Prostate cancer Brother 75   Prostate cancer Brother 62   Prostate cancer Brother    Prostate  cancer Maternal Uncle    Prostate cancer Maternal Uncle    Non-Hodgkin's lymphoma Paternal Aunt 82   Pancreatic cancer Paternal Aunt 16   Colon cancer Paternal Grandmother 28   Healthy Son    Breast cancer Neg Hx    Social History   Socioeconomic History   Marital status: Single    Spouse name: Not on file   Number of children: Not on file   Years of education: Not on file   Highest education level: Bachelor's degree (e.g., BA, AB, BS)  Occupational History   Occupation: Nurse  Tobacco Use   Smoking status: Former    Current packs/day: 0.00    Average packs/day: 1 pack/day for 2.0 years (2.0 ttl pk-yrs)    Types: Cigarettes    Start date: 02/12/1977    Quit date: 02/13/1979    Years since quitting: 45.0    Passive exposure: Never   Smokeless tobacco: Never  Vaping Use   Vaping status: Never Used  Substance and Sexual Activity   Alcohol use: Yes    Comment: rarely   Drug use: No   Sexual activity: Not Currently  Other Topics Concern   Not on file  Social History Narrative   Works as a Engineer, civil (consulting) at [employer]   Social Drivers of Health   Financial Resource Strain: Low Risk  (02/02/2024)   Overall Financial Resource Strain (CARDIA)    Difficulty of Paying Living Expenses: Not hard at all  Food Insecurity: No Food Insecurity (02/02/2024)   Hunger Vital Sign    Worried About Running Out of Food in the Last Year: Never true    Ran Out of Food in the Last Year: Never true  Transportation Needs: No Transportation Needs (02/02/2024)   PRAPARE - Administrator, Civil Service (Medical): No    Lack of Transportation (Non-Medical): No  Physical Activity: Inactive (02/02/2024)   Exercise Vital Sign    Days of Exercise per Week: 0 days    Minutes of Exercise per Session: 0 min  Stress: Stress Concern Present (02/02/2024)   [employer] of Occupational Health - Occupational Stress Questionnaire    Feeling of Stress : To some extent  Social Connections: Socially  Isolated (02/02/2024)   Social Connection and Isolation Panel [NHANES]    Frequency of  Communication with Friends and Family: More than three times a week    Frequency of Social Gatherings with Friends and Family: More than three times a week    Attends Religious Services: Never    Database administrator or Organizations: No    Attends Engineer, structural: Never    Marital Status: Never married    Tobacco Counseling Counseling given: No    Clinical Intake:  Pre-visit preparation completed: Yes  Pain : No/denies pain     BMI - recorded: 38.26 Nutritional Risks: None Diabetes: No  Lab Results  Component Value Date   HGBA1C 5.5 01/09/2024     How often do you need to have someone help you when you read instructions, pamphlets, or other written materials from your doctor or pharmacy?: 1 - Never  Interpreter Needed?: No  Information entered by :: Kandy Orris, CMA   Activities of Daily Living     02/02/2024   10:20 AM  In your present state of health, do you have any difficulty performing the following activities:  Hearing? 0  Vision? 0  Difficulty concentrating or making decisions? 0  Walking or climbing stairs? 0  Dressing or bathing? 0  Doing errands, shopping? 0  Preparing Food and eating ? N  Using the Toilet? N  In the past six months, have you accidently leaked urine? Y  Comment wears a pad/pantyliner  Do you have problems with loss of bowel control? N  Managing your Medications? N  Managing your Finances? N  Housekeeping or managing your Housekeeping? N    Patient Care Team: Arcadio Knuckles, MD as PCP - General (Internal Medicine) Boyce Byes, MD as PCP - Electrophysiology (Cardiology) Lockie Rima, MD as Consulting Physician (General Surgery) Cameron Cea, MD as Consulting Physician (Hematology and Oncology) Johna Myers, MD as Consulting Physician (Radiation Oncology) Aminta Kales, MD as Consulting Physician (Ophthalmology)  I  have updated your Care Teams any recent Medical Services you may have received from other providers in the past year.     Assessment:    This is a routine wellness examination for Callie.  Hearing/Vision screen Hearing Screening - Comments:: Denies hearing difficulties   Vision Screening - Comments:: Wears rx glasses - up to date with routine eye exams with Dr Allison Ivory   Goals Addressed               This Visit's Progress     Patient Stated (pt-stated)        Patient stated she wants to continue exercising and lose weight but neuropathy concerns are interfering.        Depression Screen     02/02/2024   10:22 AM 12/20/2023    2:37 PM 11/19/2021    2:03 PM 08/26/2021   11:09 AM 09/24/2019   10:49 AM 06/07/2015    8:29 AM  PHQ 2/9 Scores  PHQ - 2 Score 0 0 0 0 0 0  PHQ- 9 Score 1 3        Fall Risk     02/02/2024   10:21 AM 01/05/2024   11:28 AM 11/19/2021    2:03 PM  Fall Risk   Falls in the past year? 0 0 0  Number falls in past yr: 0    Injury with Fall? 0    Risk for fall due to : No Fall Risks    Follow up Falls evaluation completed;Falls prevention discussed      MEDICARE RISK AT HOME:  Medicare Risk at Home Any stairs in or around the home?: Yes If so, are there any without handrails?: No Home free of loose throw rugs in walkways, pet beds, electrical cords, etc?: Yes Adequate lighting in your home to reduce risk of falls?: Yes Life alert?: No Use of a cane, walker or w/c?: No Grab bars in the bathroom?: Yes Shower chair or bench in shower?: No Elevated toilet seat or a handicapped toilet?: Yes  TIMED UP AND GO:  Was the test performed?  No  Cognitive Function: 6CIT completed        02/02/2024   10:28 AM  6CIT Screen  What Year? 0 points  What month? 0 points  What time? 0 points  Count back from 20 0 points  Months in reverse 0 points  Repeat phrase 0 points  Total Score 0 points    Immunizations Immunization History  Administered Date(s)  Administered   Influenza,inj,Quad PF,6+ Mos 06/14/2019   Influenza-Unspecified 05/05/2021   PFIZER(Purple Top)SARS-COV-2 Vaccination 11/08/2019, 11/30/2019, 08/19/2020   PNEUMOCOCCAL CONJUGATE-20 08/26/2021   Tdap 02/03/2015   Zoster Recombinant(Shingrix) 08/26/2021    Screening Tests Health Maintenance  Topic Date Due   DEXA SCAN  08/10/2017   Zoster Vaccines- Shingrix (2 of 2) 10/21/2021   INFLUENZA VACCINE  03/30/2024   MAMMOGRAM  10/26/2024   Medicare Annual Wellness (AWV)  02/01/2025   DTaP/Tdap/Td (2 - Td or Tdap) 02/02/2025   Colonoscopy  07/14/2032   Pneumonia Vaccine 36+ Years old  Completed   Hepatitis C Screening  Completed   HPV VACCINES  Aged Out   Meningococcal B Vaccine  Aged Out   COVID-19 Vaccine  Discontinued    Health Maintenance  Health Maintenance Due  Topic Date Due   DEXA SCAN  08/10/2017   Zoster Vaccines- Shingrix (2 of 2) 10/21/2021   Health Maintenance Items Addressed: DEXA scheduled  Additional Screening:  Vision Screening: Recommended annual ophthalmology exams for early detection of glaucoma and other disorders of the eye. Would you like a referral to an eye doctor? No   Pt has seen Dr Allison Ivory in 12/2023 for a routing eye exam.  Dental Screening: Recommended annual dental exams for proper oral hygiene  Community Resource Referral / Chronic Care Management: CRR required this visit?  No   CCM required this visit?  No   Plan:    I have personally reviewed and noted the following in the patient's chart:   Medical and social history Use of alcohol, tobacco or illicit drugs  Current medications and supplements including opioid prescriptions. Patient is not currently taking opioid prescriptions. Functional ability and status Nutritional status Physical activity Advanced directives List of other physicians Hospitalizations, surgeries, and ER visits in previous 12 months Vitals Screenings to include cognitive, depression, and  falls Referrals and appointments  In addition, I have reviewed and discussed with patient certain preventive protocols, quality metrics, and best practice recommendations. A written personalized care plan for preventive services as well as general preventive health recommendations were provided to patient.   Patria Bookbinder, CMA   02/02/2024   After Visit Summary: (MyChart) Due to this being a telephonic visit, the after visit summary with patients personalized plan was offered to patient via MyChart   Notes: Nothing significant to report at this time.

## 2024-02-02 NOTE — Patient Instructions (Signed)
 Pamela Davenport , Thank you for taking time out of your busy schedule to complete your Annual Wellness Visit with me. I enjoyed our conversation and look forward to speaking with you again next year. I, as well as your care team,  appreciate your ongoing commitment to your health goals. Please review the following plan we discussed and let me know if I can assist you in the future. Your Game plan/ To Do List    Referrals: If you haven't heard from the office you've been referred to, please reach out to them at the phone provided.  DEXA scan is scheduled for 02/2024 Follow up Visits: Next Medicare AWV with our clinical staff: 02/04/2025   Have you seen your provider in the last 6 months (3 months if uncontrolled diabetes)? Yes Next Office Visit with your provider: to be scheduled by patient  Clinician Recommendations:  Aim for 30 minutes of exercise or brisk walking, 6-8 glasses of water , and 5 servings of fruits and vegetables each day. Educated and advised on getting the 2nd Shingles vaccine in 2025.      This is a list of the screening recommended for you and due dates:  Health Maintenance  Topic Date Due   DEXA scan (bone density measurement)  08/10/2017   Zoster (Shingles) Vaccine (2 of 2) 10/21/2021   Flu Shot  03/30/2024   Mammogram  10/26/2024   Medicare Annual Wellness Visit  02/01/2025   DTaP/Tdap/Td vaccine (2 - Td or Tdap) 02/02/2025   Colon Cancer Screening  07/14/2032   Pneumonia Vaccine  Completed   Hepatitis C Screening  Completed   HPV Vaccine  Aged Out   Meningitis B Vaccine  Aged Out   COVID-19 Vaccine  Discontinued    Advanced directives: (Copy Requested) Please bring a copy of your health care power of attorney and living will to the office to be added to your chart at your convenience. You can mail to [facility] [address] [place], [place] [address] or email to [email or web address] Advance Care Planning is important because it:  [x]  Makes  sure you receive the medical care that is consistent with your values, goals, and preferences  [x]  It provides guidance to your family and loved ones and reduces their decisional burden about whether or not they are making the right decisions based on your wishes.  Follow the link provided in your after visit summary or read over the paperwork we have mailed to you to help you started getting your Advance Directives in place. If you need assistance in completing these, please reach out to us  so that we can help you!

## 2024-02-13 ENCOUNTER — Ambulatory Visit: Payer: Self-pay | Admitting: Pulmonary Disease

## 2024-02-20 ENCOUNTER — Other Ambulatory Visit: Payer: Self-pay | Admitting: Rheumatology

## 2024-02-20 DIAGNOSIS — M0579 Rheumatoid arthritis with rheumatoid factor of multiple sites without organ or systems involvement: Secondary | ICD-10-CM

## 2024-02-20 NOTE — Telephone Encounter (Signed)
 Last Fill: 12/08/2023  Eye exam: 01/19/2024 WNL    Labs: 12/20/2023 Neutrophils Relative 78.7 01/09/2024 GFR 47.24  Next Visit: 06/26/2024  Last Visit: 01/24/2024  IK:Myzlfjunpi arthritis with rheumatoid factor of multiple sites without organ or systems involvement   Current Dose per office note 01/24/2024: Plaquenil  200 mg 1 tablet by mouth twice daily.   Okay to refill Plaquenil ?

## 2024-02-22 ENCOUNTER — Encounter: Payer: Self-pay | Admitting: Neurology

## 2024-02-22 ENCOUNTER — Ambulatory Visit: Admitting: Neurology

## 2024-02-22 ENCOUNTER — Other Ambulatory Visit

## 2024-02-22 VITALS — BP 130/83 | HR 79 | Ht 69.0 in | Wt 260.0 lb

## 2024-02-22 DIAGNOSIS — M79671 Pain in right foot: Secondary | ICD-10-CM | POA: Diagnosis not present

## 2024-02-22 DIAGNOSIS — R200 Anesthesia of skin: Secondary | ICD-10-CM

## 2024-02-22 DIAGNOSIS — R202 Paresthesia of skin: Secondary | ICD-10-CM

## 2024-02-22 DIAGNOSIS — M79672 Pain in left foot: Secondary | ICD-10-CM

## 2024-02-22 NOTE — Progress Notes (Signed)
 Pamela [facility] Note - Initial Visit   Date: 02/22/2024   [name] MRN: [id number] DOB: Aug 30, 1956   Dear Dr. Geofm:  Thank you for your kind referral of Pamela Davenport for consultation of bilateral feet numbness and pain. Although her history is well known to you, please allow us  to reiterate it for the purpose of our medical record. The patient was accompanied to the [facility] by self    Pamela Davenport is a 68 y.o. right-handed female with hypertension, PAF (on xeralto), hyperlipidemia, left breast cancer s/p lumpectomy and radiation (2024), GERD, and s/p lumbar surgery presenting for evaluation of bilateral feet numbness and pain.   IMPRESSION/PLAN: Bilateral painful feet paresthesias and pain most suggestive of neuropathy.  She has RA and on plaquenil  and leflunomide , the latter which can have side effect of neuropathy. I also discussed that RA can also be associated with neuropathy.  Electrodiagnostic testing will be ordered to look for neuropathy and/or demyelinating vs axonal changes.  No history of diabetes, heavy alcohol use, or chemotherapy exposure.  Normal vitamin B12, folate, and zinc .   - NCS/EMG of the legs  - Check SPEP with [facility]  - Start gabapentin  100mg  at the morning and continue 300mg  at bedtime  Further recommendations pending results.  ------------------------------------------------------------- History of present illness: Starting around March 2025, she began having burning, tingling sensation over the top of her feet and balls of the feet.  She has sensation of sand in between her toes.  She occasionally has sharp stabbing pain.  She has completely numbness of the left 2nd toe.  She reports fatigued.  She denies weakness or imbalance.  She is taking gabapentin  300mg  at bedtime which helps night time symptoms.   She was diagnosed with left breast cancer in 10/2022 s/p lumpectomy and radiation.  She did not need chemotherapy.  After started anastrazole, she developed severe exacerbation of RA so it was stopped.  She was switched to exemestane  in January 2025.  She sees Dr. Leverette for RA who started her on leflunomide  in late 2024 for rheumatoid arthritis.    She is not diabetic, nor drink alcohol.  Labs show HBA1c 5.5, vitamin B12 267, TSH 2.01, folate 7.5, zinc  71, MMA 283.  She is taking vitamin B12 supplement.  Out-side paper records, electronic medical record, and images have been reviewed where available and summarized as:  Lab Results  Component Value Date   HGBA1C 5.5 01/09/2024   Lab Results  Component Value Date   VITAMINB12 267 12/20/2023   Lab Results  Component Value Date   TSH 2.01 12/20/2023   Lab Results  Component Value Date   ESRSEDRATE 122 (H) 07/17/2023    Past Medical History:  Diagnosis Date   Atypical chest pain    normal myoview  11/11/10   Blood transfusion without reported diagnosis    Cancer (HCC)    DVT (deep venous thrombosis) (HCC)    recurrent; chronically anticoagulated with coumadin    Dysrhythmia    Family history of anesthesia complication    Sister had decrease in respirations after surgery   Heart murmur    Herniated lumbar intervertebral disc    Hypertension    Paroxysmal atrial fibrillation (HCC)    SINCE AGE 38    PE (pulmonary embolism)    chronically anticoagulated with coumadin    Pneumonia 07/20/2012   PONV (postoperative nausea and vomiting)    Rheumatoid arthritis (HCC)    Sickle cell trait (HCC)  Sinus congestion 08/14/2018   C/O NASAL/CHEST  CONGESTION, COUGHING WITH PHLEGM, PER PATIENT , SHE IS TO BEGIN TAKING AUGMENTIN  DOSE PACK TODAY;     Strep throat at age 89   The patient reports a severe strep throat infection at age 59 and   is not clear as to whether or not she may have had rheumatic fever.   Symptomatic premature ventricular contractions    improved s/p ablation   Urgency of urination    Uterine bleeding 2008   Uterine artery  embolization in 2008 for uterine bleeding.     Past Surgical History:  Procedure Laterality Date   BREAST BIOPSY Left 11/08/2022   US  LT BREAST BX W LOC DEV EA ADD LESION IMG BX SPEC US  GUIDE 11/08/2022 [facility]   BREAST BIOPSY Left 11/08/2022   US  LT BREAST BX W LOC DEV 1ST LESION IMG BX SPEC US  GUIDE 11/08/2022 [facility]   BREAST BIOPSY Left 12/17/2022   US  LT RADIOACTIVE SEED LOC 12/17/2022 [facility]   BREAST BIOPSY Left 12/17/2022   US  LT RADIOACTIVE SEED EA ADD LESION 12/17/2022 [facility]   BREAST BIOPSY Left 12/17/2022   US  LT RADIOACTIVE SEED EA ADD LESION 12/17/2022 [facility]   BREAST LUMPECTOMY WITH RADIOACTIVE SEED AND SENTINEL LYMPH NODE BIOPSY Left 12/21/2022   Procedure: LEFT BREAST BRACKETED SEED LUMPECTOMY AND LEFT SENTINEL LYMPH NODE MAPPING;  Surgeon: Vanderbilt Ned, MD;  Location:  [facility];  Service: General;  Laterality: Left;  GEN & PEC BLOCK   COLONOSCOPY W/ POLYPECTOMY     Diagnostic D&C hysteroscopy and Novasure ablation  03/17/2004   DILATION AND CURETTAGE OF UTERUS  12/17/2011   Procedure: DILATATION AND CURETTAGE;  Surgeon: Aida DELENA Na, MD;  Location: [facility];  Service: Gynecology;  Laterality: N/A;  With Attempted Hydrothermal Ablation   LUMBAR LAMINECTOMY/DECOMPRESSION MICRODISCECTOMY Left 02/21/2013   Procedure: LUMBAR LAMINECTOMY/DECOMPRESSION MICRODISCECTOMY 1 LEVEL;  Surgeon: Rockey LITTIE Peru, MD;  Location: [facility];  Service: Neurosurgery;  Laterality: Left;  LEFT Lumbar Three-Four Laminotomy foraminotomy microdiskectomy   MANDIBLE SURGERY     under bite   PVC Ablation  2010   ROBOTIC ASSISTED TOTAL HYSTERECTOMY WITH BILATERAL SALPINGO OOPHERECTOMY Bilateral 08/21/2018   Procedure: XI ROBOTIC ASSISTED TOTAL HYSTERECTOMY WITH BILATERAL SALPINGO OOPHORECTOMY;  Surgeon: Eloy Herring, MD;  Location: [facility];  Service: Gynecology;  Laterality: Bilateral;   SPINE SURGERY      TONSILLECTOMY     UTERINE ARTERY EMBOLIZATION  2008   for uterine bleeding   VASCULAR SURGERY     laser of left leg     Medications:  Outpatient Encounter Medications as of 02/22/2024  Medication Sig   ALPRAZolam  (XANAX ) 0.5 MG tablet Take 1 tablet (0.5 mg total) by mouth 2 (two) times daily.   amLODipine  (NORVASC ) 10 MG tablet Take 1 tablet (10 mg total) by mouth at bedtime.   Black Pepper-Turmeric (TURMERIC PLUS BLACK PEPPER EXT PO) Take by mouth daily.   Cholecalciferol  (VITAMIN D -3) 25 MCG (1000 UT) CAPS Take 2 capsules (2,000 Units total) by mouth daily.   esomeprazole  (NEXIUM ) 40 MG capsule Take 1 capsule (40 mg total) by mouth daily.   exemestane  (AROMASIN ) 25 MG tablet Take 1 tablet (25 mg total) by mouth daily after breakfast.   flecainide  (TAMBOCOR ) 150 MG tablet Take 2 tablets by mouth at the onset of AFIB   gabapentin  (NEURONTIN ) 100 MG capsule Take 1-3 capsules (100-300 mg total) by mouth at bedtime as needed. (  Patient taking differently: Take 300 mg by mouth at bedtime.)   hydroxychloroquine  (PLAQUENIL ) 200 MG tablet TAKE 1 TABLET TWICE DAILY   hydrOXYzine  (VISTARIL ) 50 MG capsule Take 1 capsule (50 mg total) by mouth 3 (three) times daily as needed.   irbesartan  (AVAPRO ) 300 MG tablet Take 1 tablet by mouth once daily   leflunomide  (ARAVA ) 20 MG tablet Take 1 tablet (20 mg total) by mouth daily.   nadolol  (CORGARD ) 20 MG tablet Take 1 tablet (20 mg total) by mouth daily.   nystatin -triamcinolone  ointment (MYCOLOG) Apply to affected areas two times a day   rivaroxaban  (XARELTO ) 20 MG TABS tablet Take 1 tablet (20 mg total) by mouth daily with supper.   rosuvastatin  (CRESTOR ) 10 MG tablet Take 1 tablet (10 mg total) by mouth daily.   solifenacin (VESICARE) 5 MG tablet Take 5 mg by mouth daily.   TART CHERRY PO Take 2 tablets by mouth daily.   thiamine  (VITAMIN B-1) 50 MG tablet Take 1 tablet (50 mg total) by mouth daily.   No facility-administered encounter medications on  file as of 02/22/2024.    Allergies:  Allergies  Allergen Reactions   Methylprednisolone      REACTION: Atrial fibrillation at end of medrol  dose pack   Sulfa Antibiotics Hives   Sulfonamide Derivatives Hives   Macrobid  [Nitrofurantoin ]     Muscle tightening and cramps     Family History: Family History  Problem Relation Age of Onset   Hypertension Mother    Diabetes Mother    Uterine cancer Sister 82   Prostate cancer Brother 40   Prostate cancer Brother 28   Prostate cancer Brother    Prostate cancer Maternal Uncle    Prostate cancer Maternal Uncle    Non-Hodgkin's lymphoma Paternal Aunt 63   Pancreatic cancer Paternal Aunt 8   Colon cancer Paternal Grandmother 56   Healthy Son    Breast cancer Neg Hx     Social History: Social History   Tobacco Use   Smoking status: Former    Current packs/day: 0.00    Average packs/day: 1 pack/day for 2.0 years (2.0 ttl pk-yrs)    Types: Cigarettes    Start date: 02/12/1977    Quit date: 02/13/1979    Years since quitting: 45.0    Passive exposure: Never   Smokeless tobacco: Never  Vaping Use   Vaping status: Never Used  Substance Use Topics   Alcohol use: Not Currently    Comment: rarely *has stopped due to breast cancer medicine*   Drug use: No   Social History   Social History Narrative   Works as a Engineer, civil (consulting) at [employer]      Are you right handed or left handed? Right Handed     Are you currently employed ? 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   Do you live at home alone? No    Who lives with you? Brother   What type of home do you live in: 1 story or 2 story? Lives in a two story home         Vital Signs:  BP 130/83   Pulse 79   Ht 5' 9 (1.753 m)   Wt 260 lb (117.9 kg)   SpO2 99%   BMI 38.40 kg/m   Neurological Exam: MENTAL STATUS including orientation to time, place, person, recent and remote memory, attention span and concentration, language, and  fund of knowledge  is normal.  Speech is not dysarthric.  CRANIAL NERVES: II:  No visual field defects.     III-IV-VI: Pupils equal round and reactive to light.  Normal conjugate, extra-ocular eye movements in all directions of gaze.  No nystagmus.  No ptosis.   V:  Normal facial sensation.    VII:  Normal facial symmetry and movements.   VIII:  Normal hearing and vestibular function.   IX-X:  Normal palatal movement.   XI:  Normal shoulder shrug and head rotation.   XII:  Normal tongue strength and range of motion, no deviation or fasciculation.  MOTOR:  No atrophy, fasciculations or abnormal movements.  No pronator drift.   Upper Extremity:  Right  Left  Deltoid  5/5   5/5   Biceps  5/5   5/5   Triceps  5/5   5/5   Wrist extensors  5/5   5/5   Wrist flexors  5/5   5/5   Finger extensors  5/5   5/5   Finger flexors  5/5   5/5   Dorsal interossei  5/5   5/5   Abductor pollicis  5/5   5/5   Tone (Ashworth scale)  0  0   Lower Extremity:  Right  Left  Hip flexors  5/5   5/5   Knee flexors  5/5   5/5   Knee extensors  5/5   5/5   Dorsiflexors  5/5   5/5   Plantarflexors  5/5   5/5   Toe extensors  5/5   5/5   Toe flexors  5/5   5/5   Tone (Ashworth scale)  0  0   MSRs:                                           Right        Left brachioradialis 2+  2+  biceps 2+  2+  triceps 2+  2+  patellar 2+  2+  ankle jerk 2+  2+  Hoffman no  no  plantar response down  down   SENSORY:  Hyperesthesia with pin prick and vibration below the ankles.  Vibration is not perceived at the great toe bilaterally.  Temperature and light touch is intact. Romberg's sign absent.   COORDINATION/GAIT: Normal finger-to- nose-finger.  Intact rapid alternating movements bilaterally. Gait is mildly wide-based, due to body habitus, stable, unassisted.  Stressed gait is intact.  She is unable to perform tandem gait.   Thank you for allowing me to participate in patient's care.  If I can answer any  additional questions, I would be pleased to do so.    Sincerely,    Merwyn Hodapp K. Tobie, DO

## 2024-02-22 NOTE — Patient Instructions (Addendum)
 Nerve testing of the legs  Check labs  Start taking gabapentin  100mg  in the morning and continue 300mg  in the morning  ELECTROMYOGRAM AND NERVE CONDUCTION STUDIES (EMG/NCS) INSTRUCTIONS  How to Prepare The neurologist conducting the EMG will need to know if you have certain medical conditions. Tell the neurologist and other EMG lab personnel if you: Have a pacemaker or any other electrical medical device Take blood-thinning medications Have hemophilia, a blood-clotting disorder that causes prolonged bleeding Bathing Take a shower or bath shortly before your exam in order to remove oils from your skin. Don't apply lotions or creams before the exam.  What to Expect You'll likely be asked to change into a hospital gown for the procedure and lie down on an examination table. The following explanations can help you understand what will happen during the exam.  Electrodes. The neurologist or a technician places surface electrodes at various locations on your skin depending on where you're experiencing symptoms. Or the neurologist may insert needle electrodes at different sites depending on your symptoms.  Sensations. The electrodes will at times transmit a tiny electrical current that you may feel as a twinge or spasm. The needle electrode may cause discomfort or pain that usually ends shortly after the needle is removed. If you are concerned about discomfort or pain, you may want to talk to the neurologist about taking a short break during the exam.  Instructions. During the needle EMG, the neurologist will assess whether there is any spontaneous electrical activity when the muscle is at rest - activity that isn't present in healthy muscle tissue - and the degree of activity when you slightly contract the muscle.  He or she will give you instructions on resting and contracting a muscle at appropriate times. Depending on what muscles and nerves the neurologist is examining, he or she may ask you to  change positions during the exam.  After your EMG You may experience some temporary, minor bruising where the needle electrode was inserted into your muscle. This bruising should fade within several days. If it persists, contact your primary care doctor.

## 2024-02-23 ENCOUNTER — Other Ambulatory Visit: Payer: Self-pay | Admitting: Obstetrics

## 2024-02-23 ENCOUNTER — Ambulatory Visit: Admitting: Neurology

## 2024-02-23 DIAGNOSIS — F419 Anxiety disorder, unspecified: Secondary | ICD-10-CM

## 2024-02-23 DIAGNOSIS — R200 Anesthesia of skin: Secondary | ICD-10-CM | POA: Diagnosis not present

## 2024-02-23 DIAGNOSIS — G629 Polyneuropathy, unspecified: Secondary | ICD-10-CM

## 2024-02-23 DIAGNOSIS — M79671 Pain in right foot: Secondary | ICD-10-CM

## 2024-02-23 DIAGNOSIS — R202 Paresthesia of skin: Secondary | ICD-10-CM | POA: Diagnosis not present

## 2024-02-23 MED ORDER — ALPRAZOLAM 0.5 MG PO TABS: 0.5000 mg | ORAL_TABLET | Freq: Two times a day (BID) | ORAL | 2 refills | Status: AC

## 2024-02-23 NOTE — Procedures (Signed)
 [facility]  [address] [place], [address]  [place], [place] [address] Tel: [phone] Fax: [phone] Test Date:  02/23/2024  Patient: Pamela Davenport DOB: [birth year] Physician: Tonita Blanch, DO  Sex: Female Height: 5' 9 Ref Phys: Tonita Blanch, DO  ID#: [id number]   Technician:    History: This is a 68 year old female referred for evaluation of bilateral feet paresthesias and pain.  NCV & EMG Findings: Extensive electrodiagnostic testing of the right lower extremity and additional studies of the left shows:  Bilateral sural and superficial peroneal sensory responses are absent. Bilateral peroneal motor responses at the extensor digitorum brevis show reduced amplitude, and is normal at the tibialis anterior.  Bilateral tibial motor responses are within normal limits. Bilateral tibial H reflex studies are within normal limits. There is no evidence of active or chronic motor axonal loss changes affecting any of the tested muscles.  Motor unit configuration and recruitment pattern is within normal limits.  Proximal and deep muscles were not tested as the patient is on anticoagulation therapy.  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   ___________________________ Tonita Blanch, DO    Nerve Conduction Studies   Stim Site NR Peak (ms) Norm Peak (ms) O-P Amp (V) Norm O-P Amp  Left Sup Peroneal Anti Sensory (Ant Lat Mall)  32 C  12 cm *NR  <4.6  >3  Right Sup Peroneal Anti Sensory (Ant Lat Mall)  32 C  12 cm *NR  <4.6  >3  Left Sural Anti Sensory (Lat Mall)  32 C  Calf *NR  <4.6  >3  Right Sural Anti Sensory (Lat Mall)  32 C  Calf *NR  <4.6  >3     Stim Site NR Onset (ms) Norm Onset (ms) O-P Amp (mV) Norm O-P Amp Site1 Site2 Delta-0 (ms) Dist (cm) Vel (m/s) Norm Vel (m/s)  Left Peroneal Motor (Ext Dig Brev)  32 C  Ankle    3.4 <6.0 *1.6 >2.5 B Fib Ankle  8.7 38.0 44 >40  B Fib    12.1  1.5  Poplt B Fib 1.7 8.0 47 >40  Poplt    13.8  1.3         Right Peroneal Motor (Ext Dig Brev)  32 C  Ankle    2.9 <6.0 *1.5 >2.5 B Fib Ankle 7.9 39.0 49 >40  B Fib    10.8  1.2  Poplt B Fib 1.1 8.0 73 >40  Poplt    11.9  1.1         Left Peroneal TA Motor (Tib Ant)  32 C  Fib Head    2.9 <4.5 4.0 >3 Poplit Fib Head 1.3 8.0 62 >40  Poplit    4.2 <5.7 3.7         Right Peroneal TA Motor (Tib Ant)  32 C  Fib Head    2.3 <4.5 3.6 >3 Poplit Fib Head 1.3 8.0 62 >40  Poplit    3.6 <5.7 3.0         Left Tibial Motor (Abd Hall Brev)  32 C  Ankle    2.8 <6.0 4.4 >4 Knee Ankle 10.1 43.0 43 >40  Knee    12.9  2.1         Right Tibial Motor (Abd Hall Brev)  32 C      Ankle    3.9 <6.0 5.2 >4 Knee Ankle 9.9 45.0 45 >40  Knee  13.8  0.4          Electromyography   Side Muscle Ins.Act Fibs Fasc Recrt Amp Dur Poly Activation Comment  Right AntTibialis Nml Nml Nml Nml Nml Nml Nml Nml N/A  Right Gastroc Nml Nml Nml Nml Nml Nml Nml Nml N/A  Right RectFemoris Nml Nml Nml Nml Nml Nml Nml Nml N/A  Left AntTibialis Nml Nml Nml Nml Nml Nml Nml Nml N/A  Left Gastroc Nml Nml Nml Nml Nml Nml Nml Nml N/A  Left RectFemoris Nml Nml Nml Nml Nml Nml Nml Nml N/A      Waveforms:

## 2024-02-24 ENCOUNTER — Ambulatory Visit: Payer: Self-pay | Admitting: Neurology

## 2024-02-26 LAB — PROTEIN ELECTROPHORESIS, SERUM
Albumin ELP: 4.1 g/dL (ref 3.8–4.8)
Alpha 1: 0.3 g/dL (ref 0.2–0.3)
Alpha 2: 0.8 g/dL (ref 0.5–0.9)
Beta 2: 0.5 g/dL (ref 0.2–0.5)
Beta Globulin: 0.5 g/dL (ref 0.4–0.6)
Gamma Globulin: 1 g/dL (ref 0.8–1.7)
Total Protein: 7.3 g/dL (ref 6.1–8.1)

## 2024-02-26 LAB — IMMUNOFIXATION ELECTROPHORESIS
IgM, Serum: 46 mg/dL — ABNORMAL LOW (ref 50–300)
IgM, Serum: 1257 mg/dL — AB (ref 600–300)
Immunoglobulin A: 1257 mg/dL — AB (ref 70–320)
Immunoglobulin A: 336 mg/dL — AB (ref 70–320)

## 2024-03-01 NOTE — Telephone Encounter (Signed)
 Pt called in returning Mahina's call

## 2024-03-06 NOTE — Telephone Encounter (Signed)
 Pt called in returning Mahina's call -Results

## 2024-03-14 ENCOUNTER — Encounter: Payer: Self-pay | Admitting: Neurology

## 2024-03-14 ENCOUNTER — Ambulatory Visit: Admitting: Neurology

## 2024-03-14 VITALS — BP 130/85 | HR 82 | Ht 69.0 in | Wt 259.0 lb

## 2024-03-14 DIAGNOSIS — G629 Polyneuropathy, unspecified: Secondary | ICD-10-CM | POA: Diagnosis not present

## 2024-03-14 MED ORDER — GABAPENTIN 300 MG PO CAPS: ORAL_CAPSULE | ORAL | 3 refills | Status: DC

## 2024-03-14 NOTE — Progress Notes (Signed)
 Follow-up Visit   Date: 03/14/2024    Pamela Davenport MRN: [id number] DOB: 1955/10/30    Pamela Davenport is a 68 y.o. right-handed female with hypertension, PAF (on xeralto), hyperlipidemia, left breast cancer s/p lumpectomy and radiation (2024), GERD, and s/p lumbar surgery returning to the clinic for follow-up of neuropathy.  The patient was accompanied to the clinic by self.  IMPRESSION/PLAN: Painful sensory neuropathy affecting the feet.  Risk factors:  RA. There were not demyelinating changes on NCS so unlikely to be medication side effect. Neuropathy labs are normal.   - Encouraged taking gabapentin  daily to optimize symptom control.  Take gabapentin  100mg  in the morning and 300mg  at bedtime  Return to clinic in 4 months  --------------------------------------------- History of present illness: Starting around March 2025, she began having burning, tingling sensation over the top of her feet and balls of the feet.  She has sensation of sand in between her toes.  She occasionally has sharp stabbing pain.  She has completely numbness of the left 2nd toe.  She reports fatigued.  She denies weakness or imbalance.  She is taking gabapentin  300mg  at bedtime which helps night time symptoms.    She was diagnosed with left breast cancer in 10/2022 s/p lumpectomy and radiation.  She did not need chemotherapy. After started anastrazole, she developed severe exacerbation of RA so it was stopped.  She was switched to exemestane  in January 2025.  She sees Dr. Leverette for RA who started her on leflunomide  in late 2024 for rheumatoid arthritis.     She is not diabetic, nor drink alcohol.  Labs show HBA1c 5.5, vitamin B12 267, TSH 2.01, folate 7.5, zinc  71, MMA 283.  She is taking vitamin B12 supplement.   UPDATE 03/14/2024: She is here for follow-up visit.  She continues to have burning and tingling in the feet.  She tried taking gabapentin  100mg  in the morning a few times, which did help, but  has not been taking it regularly.  She does get relief with nighttime pain when she takes gabapentin  300mg  at bedtime which allows her to sleep.  No new symptoms.  NCS/EMG shows sensory neuropathy affecting the feet.   Medications:  Current Outpatient Medications on File Prior to Visit  Medication Sig Dispense Refill   ALPRAZolam  (XANAX ) 0.5 MG tablet Take 1 tablet (0.5 mg total) by mouth 2 (two) times daily. (Patient taking differently: Take 0.5 mg by mouth as needed.) 60 tablet 2   amLODipine  (NORVASC ) 10 MG tablet Take 1 tablet (10 mg total) by mouth at bedtime. 90 tablet 2   Black Pepper-Turmeric (TURMERIC PLUS BLACK PEPPER EXT PO) Take by mouth daily.     Cholecalciferol  (VITAMIN D -3) 25 MCG (1000 UT) CAPS Take 2 capsules (2,000 Units total) by mouth daily. 180 capsule 1   esomeprazole  (NEXIUM ) 40 MG capsule Take 1 capsule (40 mg total) by mouth daily. 90 capsule 1   exemestane  (AROMASIN ) 25 MG tablet Take 1 tablet (25 mg total) by mouth daily after breakfast. 90 tablet 3   flecainide  (TAMBOCOR ) 150 MG tablet Take 2 tablets by mouth at the onset of AFIB 30 tablet 3   gabapentin  (NEURONTIN ) 100 MG capsule Take 1-3 capsules (100-300 mg total) by mouth at bedtime as needed. 90 capsule 5   hydroxychloroquine  (PLAQUENIL ) 200 MG tablet TAKE 1 TABLET TWICE DAILY 180 tablet 0   hydrOXYzine  (VISTARIL ) 50 MG capsule Take 1 capsule (50 mg total) by mouth 3 (three) times daily as  needed. 60 capsule 2   irbesartan  (AVAPRO ) 300 MG tablet Take 1 tablet by mouth once daily 90 tablet 1   leflunomide  (ARAVA ) 20 MG tablet Take 1 tablet (20 mg total) by mouth daily. 90 tablet 0   nadolol  (CORGARD ) 20 MG tablet Take 1 tablet (20 mg total) by mouth daily. 90 tablet 2   nystatin -triamcinolone  ointment (MYCOLOG) Apply to affected areas two times a day (Patient taking differently: Apply topically as needed.) 20 g 2   rivaroxaban  (XARELTO ) 20 MG TABS tablet Take 1 tablet (20 mg total) by mouth daily with supper. 90  tablet 1   rosuvastatin  (CRESTOR ) 10 MG tablet Take 1 tablet (10 mg total) by mouth daily. 90 tablet 0   solifenacin (VESICARE) 5 MG tablet Take 5 mg by mouth daily.     TART CHERRY PO Take 2 tablets by mouth daily.     thiamine  (VITAMIN B-1) 50 MG tablet Take 1 tablet (50 mg total) by mouth daily. (Patient not taking: Reported on 03/14/2024) 90 tablet 1   No current facility-administered medications on file prior to visit.    Allergies:  Allergies  Allergen Reactions   Methylprednisolone      REACTION: Atrial fibrillation at end of medrol  dose pack   Sulfa Antibiotics Hives   Sulfonamide Derivatives Hives   Macrobid  [Nitrofurantoin ]     Muscle tightening and cramps     Vital Signs:  BP 130/85   Pulse 82   Ht 5' 9 (1.753 m)   Wt 259 lb (117.5 kg)   SpO2 99%   BMI 38.25 kg/m   Neurological Exam: MENTAL STATUS including orientation to time, place, person, recent and remote memory, attention span and concentration, language, and fund of knowledge is normal.  Speech is not dysarthric.  CRANIAL NERVES:   Normal conjugate, extra-ocular eye movements in all directions of gaze.  No ptosis.  Face is symmetric.   MOTOR:  Motor strength is 5/5 in all extremities.  No atrophy, fasciculations or abnormal movements.  No pronator drift.  Tone is normal.    MSRs:  Reflexes are 2+/4 throughout.  SENSORY:  Reduced at the great toe, intact above the ankles bilaterally.   COORDINATION/GAIT:    Gait mildly wide based, stable, unassisted.   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    Thank you for allowing me to participate in patient's care.  If I can answer any additional questions, I would be pleased to do so.    Sincerely,    Randilyn Foisy K. Tobie, DO

## 2024-03-14 NOTE — Patient Instructions (Signed)
 Continue gabapentin  100mg  in the morning and 300mg  at bedtime

## 2024-03-15 ENCOUNTER — Ambulatory Visit (HOSPITAL_BASED_OUTPATIENT_CLINIC_OR_DEPARTMENT_OTHER)
Admission: RE | Admit: 2024-03-15 | Discharge: 2024-03-15 | Disposition: A | Discharge status: Home / Self Care | Source: Ambulatory Visit | Attending: Adult Health | Admitting: Adult Health

## 2024-03-15 ENCOUNTER — Other Ambulatory Visit: Payer: Medicare HMO

## 2024-03-15 DIAGNOSIS — Z13820 Encounter for screening for osteoporosis: Secondary | ICD-10-CM | POA: Diagnosis not present

## 2024-03-15 DIAGNOSIS — Z79811 Long term (current) use of aromatase inhibitors: Secondary | ICD-10-CM | POA: Insufficient documentation

## 2024-03-15 DIAGNOSIS — Z780 Asymptomatic menopausal state: Secondary | ICD-10-CM | POA: Diagnosis not present

## 2024-03-16 ENCOUNTER — Encounter: Admitting: Neurology

## 2024-03-16 ENCOUNTER — Ambulatory Visit: Payer: Self-pay

## 2024-03-16 NOTE — Telephone Encounter (Addendum)
 Called pt per message below. Pt verbalized understanding.----- Message from Morna JAYSON Kendall sent at 03/16/2024  8:31 AM EDT ----- Good news--bone density is normal.  Please share with patient. ----- Message ----- From: Interface, Rad Results In Sent: 03/16/2024   7:11 AM EDT To: Morna Dalton Kendall, NP

## 2024-03-19 ENCOUNTER — Ambulatory Visit: Admitting: Neurology

## 2024-03-22 ENCOUNTER — Ambulatory Visit: Admitting: Pulmonary Disease

## 2024-03-22 ENCOUNTER — Encounter: Payer: Self-pay | Admitting: Pulmonary Disease

## 2024-03-22 VITALS — BP 144/84 | HR 87 | Temp 97.8°F | Ht 69.0 in | Wt 264.1 lb

## 2024-03-22 DIAGNOSIS — R0609 Other forms of dyspnea: Secondary | ICD-10-CM

## 2024-03-22 DIAGNOSIS — R9389 Abnormal findings on diagnostic imaging of other specified body structures: Secondary | ICD-10-CM | POA: Diagnosis not present

## 2024-03-22 DIAGNOSIS — Z87891 Personal history of nicotine dependence: Secondary | ICD-10-CM | POA: Diagnosis not present

## 2024-03-22 LAB — PULMONARY FUNCTION TEST
DL/VA % pred: 100 %
DL/VA: 4.01 ml/min/mmHg/L
DLCO cor % pred: 76 %
DLCO cor: 17.68 ml/min/mmHg
DLCO unc % pred: 76 %
DLCO unc: 17.68 ml/min/mmHg
FEF 25-75 Post: 2.59 L/s
FEF 25-75 Pre: 2.49 L/s
FEF2575-%Change-Post: 3 %
FEF2575-%Pred-Post: 110 %
FEF2575-%Pred-Pre: 106 %
FEV1-%Change-Post: 0 %
FEV1-%Pred-Post: 85 %
FEV1-%Pred-Pre: 85 %
FEV1-Post: 2.44 L
FEV1-Pre: 2.44 L
FEV1FVC-%Change-Post: 2 %
FEV1FVC-%Pred-Pre: 107 %
FEV6-%Change-Post: -3 %
FEV6-%Pred-Post: 80 %
FEV6-%Pred-Pre: 82 %
FEV6-Post: 2.88 L
FEV6-Pre: 2.97 L
FEV6FVC-%Change-Post: 0 %
FEV6FVC-%Pred-Post: 103 %
FEV6FVC-%Pred-Pre: 104 %
FVC-%Change-Post: -2 %
FVC-%Pred-Post: 77 %
FVC-%Pred-Pre: 79 %
FVC-Post: 2.88 L
FVC-Pre: 2.97 L
Post FEV1/FVC ratio: 85 %
Post FEV6/FVC ratio: 100 %
Pre FEV1/FVC ratio: 82 %
Pre FEV6/FVC Ratio: 100 %
RV % pred: 100 %
RV: 2.4 L
TLC % pred: 92 %
TLC: 5.34 L

## 2024-03-22 NOTE — Patient Instructions (Signed)
 Full PFT performed today.

## 2024-03-22 NOTE — Progress Notes (Signed)
 @Patient  ID: Pamela Davenport, female    DOB: 09-18-1955, 68 y.o.   MRN: [id number]  No chief complaint on file.   Referring provider: Annella Donnice SAUNDERS, MD  HPI:   68 y.o. woman history of breast cancer status post lumpectomy, radiation whom we are seeing for dyspnea on exertion and abnormal CT scan.  Multiple oncology notes reviewed.  Returns for routine follow-up.  Repeat CT scan since resolution of GGO's likely pneumonitis from prior cancer treatment.  PFTs from today normal, fully interpreted below.  She denies any significant dyspnea.  No cough etc.  No pulmonary concerns.  HPI initial visit: Patient became ill 07/2023.  CT scan revealed dense left upper lobe infiltrate concerning for pneumonia on my review of interpretation, rest of lung clear.  She was treated with Augmentin  and azithromycin .  ED note reviewed.  Follow-up with her oncology doctor repeat CT scan was obtained 08/2023 to ensure resolution, that showed marked decrease in size of infiltrate left upper lobe with residual peripheral infiltrate likely developing scar as well as scattered groundglass opacities linear well-circumscribed scattered throughout on my review interpretation.  She had no significant symptoms at that time.  No cough fever chills etc.  She continues to deny cough fever chills etc.  She does have dyspnea on exertion.  This is present with radiation therapy.  Back in July 2024.  She been short breath since.  Several bit of chest discomfort on the left side or in the middle of her chest sometimes.  Not reliably reproduced with exertion.  Comes and goes.  She was started on anastrozole  03/2023 but developed adverse reaction, side effects that were not tolerable.  She has been on letrozole  08/19/2023 through 09/19/2023, the latter end of that timeframe is when the CT scan in January 2025 was obtained.  This is not for similar reasons.  Currently on exemestane .  She denies any history of asthma.  Minimal smoking  history.  Does have hayfever seasonal allergies but that usually gets better on its own.  Questionaires / Pulmonary Flowsheets:   ACT:      No data to display          MMRC:     No data to display          Epworth:      No data to display          Tests:   FENO:  No results found for: NITRICOXIDE  PFT:    Latest Ref Rng & Units 03/22/2024    1:40 PM  PFT Results  FVC-Pre L 2.97  P  FVC-Predicted Pre % 79  P  FVC-Post L 2.88  P  FVC-Predicted Post % 77  P  Pre FEV1/FVC % % 82  P  Post FEV1/FCV % % 85  P  FEV1-Pre L 2.44  P  FEV1-Predicted Pre % 85  P  FEV1-Post L 2.44  P  DLCO uncorrected ml/min/mmHg 17.68  P  DLCO UNC% % 76  P  DLCO corrected ml/min/mmHg 17.68  P  DLCO COR %Predicted % 76  P  DLVA Predicted % 100  P  TLC L 5.34  P  TLC % Predicted % 92  P  RV % Predicted % 100  P    P Preliminary result  Personally reviewed and interpreted as: Normal spirometry.  No bronchodilator response.  Lung volumes within normal limits.  DLCO within normal limits and likely underestimated.  WALK:      No  data to display          Imaging: Personally reviewed and as per EMR and discussion in this note DG Bone Density Result Date: 03/16/2024 EXAM: DUAL X-RAY ABSORPTIOMETRY (DXA) FOR BONE MINERAL DENSITY 03/15/2024 12:49 pm CLINICAL DATA:  68 year old Female Postmenopausal. Osteoporosis screening, aromatase inhibitor use Patient is or has been on bone building therapies. TECHNIQUE: An axial (e.g., hips, spine) and/or appendicular (e.g., radius) exam was performed, as appropriate, using GE Secretary/administrator at [employer]. Images are obtained for bone mineral density measurement and are not obtained for diagnostic purposes. [id number] Exclusions: None. COMPARISON:  None. New baseline. FINDINGS: Scan quality: Good. LUMBAR SPINE (L1-L4): BMD (in g/cm2): 1.441 T-score: 2.0 Z-score: 1.8 LEFT FEMORAL NECK: BMD (in g/cm2): 1.189 T-score:  1.1 Z-score: 1.0 LEFT TOTAL HIP: BMD (in g/cm2): 1.159 T-score: 1.2 Z-score: 0.7 RIGHT FEMORAL NECK: BMD (in g/cm2): 1.147 T-score: 0.8 Z-score: 0.7 RIGHT TOTAL HIP: BMD (in g/cm2): 1.131 T-score: 1.0 Z-score: 0.5 FRAX 10-YEAR PROBABILITY OF FRACTURE: FRAX not reported as the lowest BMD is not in the osteopenia range. IMPRESSION: Normal based on BMD. Fracture risk is unknown due to history of bone building therapy. RECOMMENDATIONS: 1. All patients should optimize calcium  and vitamin D  intake. 2. Consider FDA-approved medical therapies in postmenopausal women and men aged 52 years and older, based on the following: - A hip or vertebral (clinical or morphometric) fracture - T-score less than or equal to -2.5 and secondary causes have been excluded. - Low bone mass (T-score between -1.0 and -2.5) and a 10-year probability of a hip fracture greater than or equal to 3% or a 10-year probability of a major osteoporosis-related fracture greater than or equal to 20% based on the [place] -adapted WHO algorithm. - Clinician judgment and/or patient preferences may indicate treatment for people with 10-year fracture probabilities above or below these levels 3. Patients with diagnosis of osteoporosis or at high risk for fracture should have regular bone mineral density tests. For patients eligible for Medicare, routine testing is allowed once every 2 years. The testing frequency can be increased to one year for patients who have rapidly progressing disease, those who are receiving or discontinuing medical therapy to restore bone mass, or have additional risk factors. Electronically Signed   By: Pamela  Davenport M.D.   On: 03/16/2024 07:08   NCV with EMG(electromyography) Result Date: 02/23/2024 Pamela Pamela POUR, DO     02/24/2024 10:50 AM [facility] [address] [place], [address]  [place], [place] [address] Tel: [phone] Fax: [phone] Test Date:  02/23/2024 Patient: Pamela Davenport DOB: 1956-04-21 Physician: Pamela Tobie, DO  Sex: Female Height: 5' 9 Ref Phys: Pamela Tobie, DO ID#: [id number]   Technician:  History: This is a 68 year old female referred for evaluation of bilateral feet paresthesias and pain. NCV & EMG Findings: Extensive electrodiagnostic testing of the right lower extremity and additional studies of the left shows: Bilateral sural and superficial peroneal sensory responses are absent. Bilateral peroneal motor responses at the extensor digitorum brevis show reduced amplitude, and is normal at the tibialis anterior.  Bilateral tibial motor responses are within normal limits. Bilateral tibial H reflex studies are within normal limits. There is no evidence of active or chronic motor axonal loss changes affecting any of the tested muscles.  Motor unit configuration and recruitment pattern is within normal limits.  Proximal and deep muscles were not tested as the patient is on anticoagulation therapy. 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 ___________________________ Pamela Blanch, DO Nerve Conduction Studies  Stim Site NR Peak (ms) Norm Peak (ms) O-P Amp (V) Norm O-P Amp Left Sup Peroneal Anti Sensory (Ant Lat Mall)  32 C 12 cm *NR  <4.6  >3 Right Sup Peroneal Anti Sensory (Ant Lat Mall)  32 C 12 cm *NR  <4.6  >3 Left Sural Anti Sensory (Lat Mall)  32 C Calf *NR  <4.6  >3 Right Sural Anti Sensory (Lat Mall)  32 C Calf *NR  <4.6  >3  Stim Site NR Onset (ms) Norm Onset (ms) O-P Amp (mV) Norm O-P Amp Site1 Site2 Delta-0 (ms) Dist (cm) Vel (m/s) Norm Vel (m/s) Left Peroneal Motor (Ext Dig Brev)  32 C Ankle    3.4 <6.0 *1.6 >2.5 B Fib Ankle 8.7 38.0 44 >40 B Fib    12.1  1.5  Poplt B Fib 1.7 8.0 47 >40 Poplt    13.8  1.3        Right Peroneal Motor (Ext Dig Brev)  32 C Ankle    2.9 <6.0 *1.5 >2.5 B Fib Ankle 7.9 39.0 49 >40 B Fib    10.8  1.2  Poplt B Fib 1.1 8.0 73 >40 Poplt    11.9  1.1        Left Peroneal TA  Motor (Tib Ant)  32 C Fib Head    2.9 <4.5 4.0 >3 Poplit Fib Head 1.3 8.0 62 >40 Poplit    4.2 <5.7 3.7        Right Peroneal TA Motor (Tib Ant)  32 C Fib Head    2.3 <4.5 3.6 >3 Poplit Fib Head 1.3 8.0 62 >40 Poplit    3.6 <5.7 3.0        Left Tibial Motor (Abd Hall Brev)  32 C Ankle    2.8 <6.0 4.4 >4 Knee Ankle 10.1 43.0 43 >40 Knee    12.9  2.1        Right Tibial Motor (Abd Hall Brev)  32 C     Ankle    3.9 <6.0 5.2 >4 Knee Ankle 9.9 45.0 45 >40 Knee    13.8  0.4        Electromyography  Side Muscle Ins.Act Fibs Fasc Recrt Amp Dur Poly Activation Comment Right AntTibialis Nml Nml Nml Nml Nml Nml Nml Nml N/A Right Gastroc Nml Nml Nml Nml Nml Nml Nml Nml N/A Right RectFemoris Nml Nml Nml Nml Nml Nml Nml Nml N/A Left AntTibialis Nml Nml Nml Nml Nml Nml Nml Nml N/A Left Gastroc Nml Nml Nml Nml Nml Nml Nml Nml N/A Left RectFemoris Nml Nml Nml Nml Nml Nml Nml Nml N/A Waveforms:                        Lab Results: Personally reviewed CBC    Component Value Date/Time   WBC 5.8 12/20/2023 1549   RBC 4.58 12/20/2023 1549   HGB 12.0 12/20/2023 1549   HGB 11.1 (L) 11/17/2022 1201   HGB 12.4 03/30/2017 0952   HCT 37.0 12/20/2023 1549   HCT 36.6 03/30/2017 0952   PLT 261.0 12/20/2023 1549   PLT 275 11/17/2022 1201   PLT 258 03/30/2017 0952   MCV 80.7 12/20/2023 1549   MCV 80 03/30/2017 0952   MCH 26.3 (L) 09/19/2023 1151   MCHC 32.4 12/20/2023 1549   RDW 14.5 12/20/2023 1549   RDW 13.2 03/30/2017 0952  LYMPHSABS 0.7 12/20/2023 1549   LYMPHSABS 1.6 03/30/2017 0952   MONOABS 0.3 12/20/2023 1549   EOSABS 0.2 12/20/2023 1549   EOSABS 0.1 03/30/2017 0952   BASOSABS 0.0 12/20/2023 1549   BASOSABS 0.0 03/30/2017 0952    BMET    Component Value Date/Time   NA 140 01/09/2024 1117   NA 138 02/09/2023 1048   K 4.0 01/09/2024 1117   CL 105 01/09/2024 1117   CO2 26 01/09/2024 1117   GLUCOSE 94 01/09/2024 1117   BUN 14 01/09/2024 1117   BUN 17 02/09/2023 1048   CREATININE 1.19 01/09/2024  1117   CREATININE 1.10 (H) 09/19/2023 1151   CALCIUM  9.4 01/09/2024 1117   GFRNONAA 44 (L) 07/17/2023 1249   GFRNONAA 46 (L) 11/17/2022 1201   GFRNONAA 62 11/13/2020 1127   GFRAA 72 11/13/2020 1127    BNP No results found for: BNP  ProBNP    Component Value Date/Time   PROBNP 12.0 12/20/2023 1549    Specialty Problems   None    Allergies  Allergen Reactions   Methylprednisolone      REACTION: Atrial fibrillation at end of medrol  dose pack   Sulfa Antibiotics Hives   Sulfonamide Derivatives Hives   Macrobid  [Nitrofurantoin ]     Muscle tightening and cramps     Immunization History  Administered Date(s) Administered   Influenza,inj,Quad PF,6+ Mos 06/14/2019   Influenza-Unspecified 05/05/2021   PFIZER(Purple Top)SARS-COV-2 Vaccination 11/08/2019, 11/30/2019, 08/19/2020   PNEUMOCOCCAL CONJUGATE-20 08/26/2021   Tdap 02/03/2015   Zoster Recombinant(Shingrix) 08/26/2021    Past Medical History:  Diagnosis Date   Atypical chest pain    normal myoview  11/11/10   Blood transfusion without reported diagnosis    Cancer (HCC)    DVT (deep venous thrombosis) (HCC)    recurrent; chronically anticoagulated with coumadin    Dysrhythmia    Family history of anesthesia complication    Sister had decrease in respirations after surgery   Heart murmur    Herniated lumbar intervertebral disc    Hypertension    Paroxysmal atrial fibrillation (HCC)    SINCE AGE 63    PE (pulmonary embolism)    chronically anticoagulated with coumadin    Pneumonia 07/20/2012   PONV (postoperative nausea and vomiting)    Rheumatoid arthritis (HCC)    Sickle cell trait (HCC)    Sinus congestion 08/14/2018   C/O NASAL/CHEST  CONGESTION, COUGHING WITH PHLEGM, PER PATIENT , SHE IS TO BEGIN TAKING AUGMENTIN  DOSE PACK TODAY;     Strep throat at age 73   The patient reports a severe strep throat infection at age 56 and   is not clear as to whether or not she may have had rheumatic fever.    Symptomatic premature ventricular contractions    improved s/p ablation   Urgency of urination    Uterine bleeding 2008   Uterine artery embolization in 2008 for uterine bleeding.     Tobacco History: Social History   Tobacco Use  Smoking Status Former   Current packs/day: 0.00   Average packs/day: 1 pack/day for 2.0 years (2.0 ttl pk-yrs)   Types: Cigarettes   Start date: 02/12/1977   Quit date: 02/13/1979   Years since quitting: 45.1   Passive exposure: Never  Smokeless Tobacco Never   Counseling given: Not Answered   Continue to not smoke  Outpatient Encounter Medications as of 03/22/2024  Medication Sig   ALPRAZolam  (XANAX ) 0.5 MG tablet Take 1 tablet (0.5 mg total) by mouth 2 (two) times daily. (Patient  taking differently: Take 0.5 mg by mouth as needed.)   amLODipine  (NORVASC ) 10 MG tablet Take 1 tablet (10 mg total) by mouth at bedtime.   Black Pepper-Turmeric (TURMERIC PLUS BLACK PEPPER EXT PO) Take by mouth daily.   Cholecalciferol  (VITAMIN D -3) 25 MCG (1000 UT) CAPS Take 2 capsules (2,000 Units total) by mouth daily.   esomeprazole  (NEXIUM ) 40 MG capsule Take 1 capsule (40 mg total) by mouth daily.   exemestane  (AROMASIN ) 25 MG tablet Take 1 tablet (25 mg total) by mouth daily after breakfast.   flecainide  (TAMBOCOR ) 150 MG tablet Take 2 tablets by mouth at the onset of AFIB   gabapentin  (NEURONTIN ) 100 MG capsule Take 1-3 capsules (100-300 mg total) by mouth at bedtime as needed.   gabapentin  (NEURONTIN ) 300 MG capsule Take 300mg  at bedtime.   hydroxychloroquine  (PLAQUENIL ) 200 MG tablet TAKE 1 TABLET TWICE DAILY   hydrOXYzine  (VISTARIL ) 50 MG capsule Take 1 capsule (50 mg total) by mouth 3 (three) times daily as needed.   irbesartan  (AVAPRO ) 300 MG tablet Take 1 tablet by mouth once daily   leflunomide  (ARAVA ) 20 MG tablet Take 1 tablet (20 mg total) by mouth daily.   nadolol  (CORGARD ) 20 MG tablet Take 1 tablet (20 mg total) by mouth daily.   nystatin -triamcinolone   ointment (MYCOLOG) Apply to affected areas two times a day (Patient taking differently: Apply topically as needed.)   rivaroxaban  (XARELTO ) 20 MG TABS tablet Take 1 tablet (20 mg total) by mouth daily with supper.   rosuvastatin  (CRESTOR ) 10 MG tablet Take 1 tablet (10 mg total) by mouth daily.   solifenacin (VESICARE) 5 MG tablet Take 5 mg by mouth daily.   TART CHERRY PO Take 2 tablets by mouth daily.   thiamine  (VITAMIN B-1) 50 MG tablet Take 1 tablet (50 mg total) by mouth daily. (Patient not taking: Reported on 03/22/2024)   No facility-administered encounter medications on file as of 03/22/2024.     Review of Systems  Review of Systems  N/a Physical Exam  BP (!) 144/84   Pulse 87   Temp 97.8 F (36.6 C) (Temporal)   Ht 5' 9 (1.753 m)   Wt 264 lb 1.6 oz (119.8 kg)   SpO2 97% Comment: ra  BMI 39.00 kg/m   Wt Readings from Last 5 Encounters:  03/22/24 264 lb 1.6 oz (119.8 kg)  03/14/24 259 lb (117.5 kg)  02/22/24 260 lb (117.9 kg)  02/02/24 259 lb (117.5 kg)  01/24/24 259 lb 3.2 oz (117.6 kg)    BMI Readings from Last 5 Encounters:  03/22/24 39.00 kg/m  03/14/24 38.25 kg/m  02/22/24 38.40 kg/m  02/02/24 38.25 kg/m  01/24/24 38.28 kg/m     Physical Exam General: Sitting in chair, no acute distress Eyes: EOMI, no icterus Neck: Supple, no JVP Pulmonary: Normal work of breathing on room air Abdomen: Nondistended MSK: No synovitis, no joint effusion Neuro: Normal gait, no weakness Psych: Normal mood, full affect   Assessment & Plan:   Dyspnea on exertion: Really new and not an issue until after radiation therapy.  Suspect deconditioning after all she has been through in the last year, decreased activity.  Lung parenchyma historically clear.  Denies history of asthma.  She does have atopic symptoms so asthma is possible.  Dyspnea overall improving with increased activity.  PFTs totally normal.  Never had evaluation at this time.  Groundglass opacities:  Scattered throughout the lungs 08/2023, not present 07/2023.  Possible residual inflammation from healing pneumonia  versus organizing pneumonia as positive by radiologist.  There are reports organize pneumonias with aromatase inhibitors of which she has been on, now on her third.  She was on letrozole  07/2023 and taking it at the time of her CT scan.  Other than dyspnea as above she has no infectious symptoms nor symptoms of organized pneumonia such as cough sputum production etc.  Repeat CT scan 01/2024 with resolution of opacities.  This is most likely pneumonitis from her prior therapy.  Return if symptoms worsen or fail to improve.   Donnice JONELLE Beals, MD 03/22/2024

## 2024-03-22 NOTE — Progress Notes (Signed)
 Full PFT performed today.

## 2024-03-26 ENCOUNTER — Ambulatory Visit: Attending: Surgery

## 2024-03-26 VITALS — Wt 260.5 lb

## 2024-03-26 DIAGNOSIS — Z483 Aftercare following surgery for neoplasm: Secondary | ICD-10-CM | POA: Insufficient documentation

## 2024-03-26 NOTE — Therapy (Signed)
 OUTPATIENT PHYSICAL THERAPY SOZO SCREENING NOTE   Patient Name: Pamela Davenport MRN: [id number] DOB:[birth year], 68 y.o., female Today's Date: 03/26/2024  PCP: Joshua Debby CROME, MD REFERRING PROVIDER: Vanderbilt Debby, MD   PT End of Session - 03/26/24 1525     Visit Number 2   # unchanged due to screen only   PT Start Time 1523    PT Stop Time 1527    PT Time Calculation (min) 4 min    Activity Tolerance Patient tolerated treatment well    Behavior During Therapy [facility] for tasks assessed/performed          Past Medical History:  Diagnosis Date   Atypical chest pain    normal myoview  11/11/10   Blood transfusion without reported diagnosis    Cancer (HCC)    DVT (deep venous thrombosis) (HCC)    recurrent; chronically anticoagulated with coumadin    Dysrhythmia    Family history of anesthesia complication    Sister had decrease in respirations after surgery   Heart murmur    Herniated lumbar intervertebral disc    Hypertension    Paroxysmal atrial fibrillation (HCC)    SINCE AGE 78    PE (pulmonary embolism)    chronically anticoagulated with coumadin    Pneumonia 07/20/2012   PONV (postoperative nausea and vomiting)    Rheumatoid arthritis (HCC)    Sickle cell trait (HCC)    Sinus congestion 08/14/2018   C/O NASAL/CHEST  CONGESTION, COUGHING WITH PHLEGM, PER PATIENT , SHE IS TO BEGIN TAKING AUGMENTIN  DOSE PACK TODAY;     Strep throat at age 75   The patient reports a severe strep throat infection at age 60 and   is not clear as to whether or not she may have had rheumatic fever.   Symptomatic premature ventricular contractions    improved s/p ablation   Urgency of urination    Uterine bleeding 2008   Uterine artery embolization in 2008 for uterine bleeding.    Past Surgical History:  Procedure Laterality Date   BREAST BIOPSY Left 11/08/2022   US  LT BREAST BX W LOC DEV EA ADD LESION IMG BX SPEC US  GUIDE 11/08/2022 [facility]   BREAST BIOPSY Left 11/08/2022   US   LT BREAST BX W LOC DEV 1ST LESION IMG BX SPEC US  GUIDE 11/08/2022 [facility]   BREAST BIOPSY Left 12/17/2022   US  LT RADIOACTIVE SEED LOC 12/17/2022 [facility]   BREAST BIOPSY Left 12/17/2022   US  LT RADIOACTIVE SEED EA ADD LESION 12/17/2022 [facility]   BREAST BIOPSY Left 12/17/2022   US  LT RADIOACTIVE SEED EA ADD LESION 12/17/2022 [facility]MO[facility]   BREAST LUMPECTOMY WITH RADIOACTIVE SEED AND SENTINEL LYMPH NODE BIOPSY Left 12/21/2022   Procedure: LEFT BREAST BRACKETED SEED LUMPECTOMY AND LEFT SENTINEL LYMPH NODE MAPPING;  Surgeon: Vanderbilt Debby, MD;  Location: [facility];  Service: General;  Laterality: Left;  GEN & PEC BLOCK   COLONOSCOPY W/ POLYPECTOMY     Diagnostic D&C hysteroscopy and Novasure ablation  03/17/2004   DILATION AND CURETTAGE OF UTERUS  12/17/2011   Procedure: DILATATION AND CURETTAGE;  Surgeon: Aida DELENA Na, MD;  Location: [facility];  Service: Gynecology;  Laterality: N/A;  With Attempted Hydrothermal Ablation   LUMBAR LAMINECTOMY/DECOMPRESSION MICRODISCECTOMY Left 02/21/2013   Procedure: LUMBAR LAMINECTOMY/DECOMPRESSION MICRODISCECTOMY 1 LEVEL;  Surgeon: Rockey CROME Peru, MD;  Location: [facility];  Service: Neurosurgery;  Laterality: Left;  LEFT Lumbar Three-Four Laminotomy foraminotomy microdiskectomy   MANDIBLE SURGERY  under bite   PVC Ablation  2010   ROBOTIC ASSISTED TOTAL HYSTERECTOMY WITH BILATERAL SALPINGO OOPHERECTOMY Bilateral 08/21/2018   Procedure: XI ROBOTIC ASSISTED TOTAL HYSTERECTOMY WITH BILATERAL SALPINGO OOPHORECTOMY;  Surgeon: Eloy Herring, MD;  Location: [facility] [place];  Service: Gynecology;  Laterality: Bilateral;   SPINE SURGERY     TONSILLECTOMY     UTERINE ARTERY EMBOLIZATION  2008   for uterine bleeding   VASCULAR SURGERY     laser of left leg   Patient Active Problem List   Diagnosis Date Noted   Chronic hyperglycemia 01/06/2024   Deficiency anemia 12/20/2023   Malignant neoplasm of  lower-inner quadrant of left breast in female, estrogen receptor positive (HCC) 11/15/2022   Anemia due to acquired thiamine  deficiency 05/07/2022   Gastroesophageal reflux disease with esophagitis without hemorrhage 05/04/2022   Esophageal dysphagia 05/04/2022   OAB (overactive bladder) 12/31/2021   Encounter for general adult medical examination with abnormal findings 08/26/2021   PAF (paroxysmal atrial fibrillation) (HCC) 08/26/2021   GERD without esophagitis 10/04/2019   Vitamin D  deficiency disease 09/25/2019   Hypercoagulable state (HCC) 09/24/2019   Iron deficiency anemia 12/07/2018   Other dietary vitamin B12 deficiency anemia 12/07/2018   Visit for screening mammogram 04/18/2017   Hyperlipidemia with target LDL less than 130 10/19/2016   Essential hypertension 01/13/2015   Chronic pulmonary embolism (HCC) 12/05/2008    REFERRING DIAG: left breast cancer at risk for lymphedema  THERAPY DIAG: Aftercare following surgery for neoplasm  PERTINENT HISTORY: Patient was diagnosed on 11/09/2022 with left grade 1 invasive ductal carcinoma breast cancer. She underwent a left lumpectomy and sentinel node biopsy on 12/21/2022. It is ER/PR positive and HER2 negative with a Ki67 of 10%.   PRECAUTIONS: left UE Lymphedema risk, None  SUBJECTIVE: Pt returns for her 1 month L-Dex screen.   PAIN:  Are you having pain? No  SOZO SCREENING: Patient was assessed today using the SOZO machine to determine the lymphedema index score. This was compared to her baseline score. It was determined that she is NOT within the recommended range when compared to her baseline. She still has her compression sleeve from last increase so encouraged her to resume wear of this. It is recommended she return in 1 month to be reassessed. If she continues to measure outside the recommended range for 1 more month, then physical therapy treatment will be recommended at that time and a referral requested.    L-DEX  FLOWSHEETS - 03/26/24 1500       L-DEX LYMPHEDEMA SCREENING   Measurement Type Unilateral    L-DEX MEASUREMENT EXTREMITY Upper Extremity    POSITION  Standing    DOMINANT SIDE Right    At Risk Side Left    BASELINE SCORE (UNILATERAL) 1.3    L-DEX SCORE (UNILATERAL) 8.9    VALUE CHANGE (UNILAT) 7.6           Aden Berwyn Caldron, PTA 03/26/2024, 3:27 PM

## 2024-04-03 DIAGNOSIS — R350 Frequency of micturition: Secondary | ICD-10-CM | POA: Diagnosis not present

## 2024-04-03 DIAGNOSIS — N3941 Urge incontinence: Secondary | ICD-10-CM | POA: Diagnosis not present

## 2024-04-09 ENCOUNTER — Telehealth: Payer: Self-pay | Admitting: Neurology

## 2024-04-09 NOTE — Telephone Encounter (Signed)
 Pt left a VM stating that she has a question about her Gabapentin 

## 2024-04-10 DIAGNOSIS — N3941 Urge incontinence: Secondary | ICD-10-CM | POA: Diagnosis not present

## 2024-04-10 DIAGNOSIS — R350 Frequency of micturition: Secondary | ICD-10-CM | POA: Diagnosis not present

## 2024-04-10 MED ORDER — GABAPENTIN 100 MG PO CAPS: 100.0000 mg | ORAL_CAPSULE | Freq: Every day | ORAL | 3 refills | Status: DC

## 2024-04-10 NOTE — Telephone Encounter (Signed)
 Rx sent.

## 2024-04-10 NOTE — Telephone Encounter (Signed)
 Called patient and she informed me that per her last conversation with Dr. Tobie she was suppose to take Gabapentin  100 mg during the day and 300 mg at bedtime. Patient stated she currently has 300 mg tablets for her bedtime dose but is needing 100 mg gabapentin  as she is almost out of her old prescription from her other doctor. Patient stated she needs the rx sent to [facility]. Patient aware I wills end this message to Dr. Tobie and call her back. Patient thanked me for the call and had no further questions.

## 2024-04-12 ENCOUNTER — Other Ambulatory Visit: Payer: Self-pay | Admitting: *Deleted

## 2024-04-12 DIAGNOSIS — Z131 Encounter for screening for diabetes mellitus: Secondary | ICD-10-CM | POA: Diagnosis not present

## 2024-04-12 DIAGNOSIS — Z79899 Other long term (current) drug therapy: Secondary | ICD-10-CM | POA: Diagnosis not present

## 2024-04-13 ENCOUNTER — Ambulatory Visit: Payer: Self-pay | Admitting: Rheumatology

## 2024-04-13 LAB — COMPREHENSIVE METABOLIC PANEL WITH GFR
AG Ratio: 1.4 (calc) (ref 1.0–2.5)
ALT: 16 U/L (ref 6–29)
AST: 23 U/L (ref 10–35)
Albumin: 4.4 g/dL (ref 3.6–5.1)
Alkaline phosphatase (APISO): 93 U/L (ref 37–153)
BUN/Creatinine Ratio: 17 (calc) (ref 6–22)
BUN: 20 mg/dL (ref 7–25)
CO2: 23 mmol/L (ref 20–32)
Calcium: 10.1 mg/dL (ref 8.6–10.4)
Chloride: 107 mmol/L (ref 98–110)
Creat: 1.17 mg/dL — ABNORMAL HIGH (ref 0.50–1.05)
Globulin: 3.2 g/dL (ref 1.9–3.7)
Glucose, Bld: 83 mg/dL (ref 65–99)
Potassium: 4.4 mmol/L (ref 3.5–5.3)
Sodium: 140 mmol/L (ref 135–146)
Total Bilirubin: 0.3 mg/dL (ref 0.2–1.2)
Total Protein: 7.6 g/dL (ref 6.1–8.1)
eGFR: 51 mL/min/1.73m2 — ABNORMAL LOW (ref >=60)

## 2024-04-13 LAB — CBC WITH DIFFERENTIAL/PLATELET
Absolute Lymphocytes: 917 {cells}/uL (ref 850–3900)
Absolute Monocytes: 416 {cells}/uL (ref 200–950)
Basophils Absolute: 52 {cells}/uL (ref 0–200)
Basophils Relative: 0.8 %
Eosinophils Absolute: 416 {cells}/uL (ref 15–500)
Eosinophils Relative: 6.4 %
HCT: 36.6 % (ref 35.0–45.0)
Hemoglobin: 11.9 g/dL (ref 11.7–15.5)
MCH: 26.8 pg — ABNORMAL LOW (ref 27.0–33.0)
MCHC: 32.5 g/dL (ref 32.0–36.0)
MCV: 82.4 fL (ref 80.0–100.0)
MPV: 10.9 fL (ref 7.5–12.5)
Monocytes Relative: 6.4 %
Neutro Abs: 4700 {cells}/uL (ref 1500–7800)
Neutrophils Relative %: 72.3 %
Platelets: 229 Thousand/uL (ref 140–400)
RBC: 4.44 Million/uL (ref 3.80–5.10)
RDW: 12.9 % (ref 11.0–15.0)
Total Lymphocyte: 14.1 %
WBC: 6.5 Thousand/uL (ref 3.8–10.8)

## 2024-04-13 LAB — HEMOGLOBIN A1C
Hgb A1c MFr Bld: 5.6 % (ref <5.7)
Mean Plasma Glucose: 114 mg/dL
eAG (mmol/L): 6.3 mmol/L

## 2024-04-13 NOTE — Progress Notes (Signed)
 Creatinine is mildly elevated and stable.  CBC normal, hemoglobin A1c normal.

## 2024-04-16 ENCOUNTER — Other Ambulatory Visit: Payer: Self-pay | Admitting: *Deleted

## 2024-04-16 MED ORDER — LEFLUNOMIDE 20 MG PO TABS: 20.0000 mg | ORAL_TABLET | Freq: Every day | ORAL | 0 refills | Status: DC

## 2024-04-16 NOTE — Telephone Encounter (Signed)
 Patient contacted the office stating she is currently on Arava . Patient states she is having trouble with neuropathy in her feet. Patient states she has had an NCV with Dr. Tobie the neurologist and it did confirm the neuropathy. Patient states Dr. Tobie wants her to increase her Gabapentin . Patient states she does not want to do that because she feels she is on so much medication since her cancer diagnosis. Patient states she feels like it may be a side effect of the Arava . Patient would like to know if there is an alternative medication she can take that does not have this side effect. Please advise.

## 2024-04-16 NOTE — Telephone Encounter (Signed)
 Arava  should not cause neuropathy.  Arava  has the least side effects of all the other treatment options.  We can discuss other treatment options at the follow-up visit.

## 2024-04-16 NOTE — Telephone Encounter (Signed)
 Attempted to contact the patient and left message for patient to call the office.

## 2024-04-16 NOTE — Addendum Note (Signed)
 Addended by: Declan Adamson P on: 04/16/2024 01:12 PM   Modules accepted: Orders

## 2024-04-16 NOTE — Telephone Encounter (Signed)
 Patient advised Arava  should not cause neuropathy.  Arava  has the least side effects of all the other treatment options.  We can discuss other treatment options at the follow-up visit. Patient verbalized understanding.   Patient states she needs a refill of Leflunomide  sent to [facility]. Patient states she had waited on the refill because she did not know if she was going to change medications or not.   Last Fill: 12/08/2023  Labs: 04/12/2024 Creatinine is mildly elevated and stable. CBC normal, hemoglobin A1c normal.   Next Visit: 06/26/2024  Last Visit: 01/24/2024  DX: Rheumatoid arthritis with rheumatoid factor of multiple sites without organ or systems involvement (HCC)   Current Dose per office note 01/24/2024: Arava  20mg  by mouth daily   Okay to refill Arava  ?

## 2024-04-17 ENCOUNTER — Ambulatory Visit: Admitting: Rheumatology

## 2024-04-18 DIAGNOSIS — N3941 Urge incontinence: Secondary | ICD-10-CM | POA: Diagnosis not present

## 2024-04-18 DIAGNOSIS — R350 Frequency of micturition: Secondary | ICD-10-CM | POA: Diagnosis not present

## 2024-05-02 ENCOUNTER — Ambulatory Visit: Attending: Surgery

## 2024-05-02 VITALS — Wt 258.2 lb

## 2024-05-02 DIAGNOSIS — Z483 Aftercare following surgery for neoplasm: Secondary | ICD-10-CM | POA: Insufficient documentation

## 2024-05-02 NOTE — Therapy (Signed)
 OUTPATIENT PHYSICAL THERAPY SOZO SCREENING NOTE   Patient Name: Pamela Davenport MRN: [id number] DOB:1956/02/25, 68 y.o., female Today's Date: 05/02/2024  PCP: Joshua Debby CROME, MD REFERRING PROVIDER: Vanderbilt Debby, MD   PT End of Session - 05/02/24 1509     Visit Number 2   # unchanged due to screen only   PT Start Time 1507    PT Stop Time 1513    PT Time Calculation (min) 6 min    Activity Tolerance Patient tolerated treatment well    Behavior During Therapy [facility] for tasks assessed/performed          Past Medical History:  Diagnosis Date   Atypical chest pain    normal myoview  11/11/10   Blood transfusion without reported diagnosis    Cancer (HCC)    DVT (deep venous thrombosis) (HCC)    recurrent; chronically anticoagulated with coumadin    Dysrhythmia    Family history of anesthesia complication    Sister had decrease in respirations after surgery   Heart murmur    Herniated lumbar intervertebral disc    Hypertension    Paroxysmal atrial fibrillation (HCC)    SINCE AGE 89    PE (pulmonary embolism)    chronically anticoagulated with coumadin    Pneumonia 07/20/2012   PONV (postoperative nausea and vomiting)    Rheumatoid arthritis (HCC)    Sickle cell trait (HCC)    Sinus congestion 08/14/2018   C/O NASAL/CHEST  CONGESTION, COUGHING WITH PHLEGM, PER PATIENT , SHE IS TO BEGIN TAKING AUGMENTIN  DOSE PACK TODAY;     Strep throat at age 25   The patient reports a severe strep throat infection at age 71 and   is not clear as to whether or not she may have had rheumatic fever.   Symptomatic premature ventricular contractions    improved s/p ablation   Urgency of urination    Uterine bleeding 2008   Uterine artery embolization in 2008 for uterine bleeding.    Past Surgical History:  Procedure Laterality Date   BREAST BIOPSY Left 11/08/2022   US  LT BREAST BX W LOC DEV EA ADD LESION IMG BX SPEC US  GUIDE 11/08/2022 [facility]   BREAST BIOPSY Left 11/08/2022   US   LT BREAST BX W LOC DEV 1ST LESION IMG BX SPEC US  GUIDE 11/08/2022 [facility]   BREAST BIOPSY Left 12/17/2022   US  LT RADIOACTIVE SEED LOC 12/17/2022 [facility]   BREAST BIOPSY Left 12/17/2022   US  LT RADIOACTIVE SEED EA ADD LESION 12/17/2022 [facility]   BREAST BIOPSY Left 12/17/2022   US  LT RADIOACTIVE SEED EA ADD LESION 12/17/2022 [facility]   BREAST LUMPECTOMY WITH RADIOACTIVE SEED AND SENTINEL LYMPH NODE BIOPSY Left 12/21/2022   Procedure: LEFT BREAST BRACKETED SEED LUMPECTOMY AND LEFT SENTINEL LYMPH NODE MAPPING;  Surgeon: Vanderbilt Debby, MD;  Location: [facility];  Service: General;  Laterality: Left;  GEN & PEC BLOCK   COLONOSCOPY W/ POLYPECTOMY     Diagnostic D&C hysteroscopy and Novasure ablation  03/17/2004   DILATION AND CURETTAGE OF UTERUS  12/17/2011   Procedure: DILATATION AND CURETTAGE;  Surgeon: Aida DELENA Na, MD;  Location: [facility];  Service: Gynecology;  Laterality: N/A;  With Attempted Hydrothermal Ablation   LUMBAR LAMINECTOMY/DECOMPRESSION MICRODISCECTOMY Left 02/21/2013   Procedure: LUMBAR LAMINECTOMY/DECOMPRESSION MICRODISCECTOMY 1 LEVEL;  Surgeon: Rockey CROME Peru, MD;  Location: [facility];  Service: Neurosurgery;  Laterality: Left;  LEFT Lumbar Three-Four Laminotomy foraminotomy microdiskectomy   MANDIBLE SURGERY  under bite   PVC Ablation  2010   ROBOTIC ASSISTED TOTAL HYSTERECTOMY WITH BILATERAL SALPINGO OOPHERECTOMY Bilateral 08/21/2018   Procedure: XI ROBOTIC ASSISTED TOTAL HYSTERECTOMY WITH BILATERAL SALPINGO OOPHORECTOMY;  Surgeon: Eloy Herring, MD;  Location: [facility];  Service: Gynecology;  Laterality: Bilateral;   SPINE SURGERY     TONSILLECTOMY     UTERINE ARTERY EMBOLIZATION  2008   for uterine bleeding   VASCULAR SURGERY     laser of left leg   Patient Active Problem List   Diagnosis Date Noted   Chronic hyperglycemia 01/06/2024   Deficiency anemia 12/20/2023   Malignant neoplasm of  lower-inner quadrant of left breast in female, estrogen receptor positive (HCC) 11/15/2022   Anemia due to acquired thiamine  deficiency 05/07/2022   Gastroesophageal reflux disease with esophagitis without hemorrhage 05/04/2022   Esophageal dysphagia 05/04/2022   OAB (overactive bladder) 12/31/2021   Encounter for general adult medical examination with abnormal findings 08/26/2021   PAF (paroxysmal atrial fibrillation) (HCC) 08/26/2021   GERD without esophagitis 10/04/2019   Vitamin D  deficiency disease 09/25/2019   Hypercoagulable state (HCC) 09/24/2019   Iron deficiency anemia 12/07/2018   Other dietary vitamin B12 deficiency anemia 12/07/2018   Visit for screening mammogram 04/18/2017   Hyperlipidemia with target LDL less than 130 10/19/2016   Essential hypertension 01/13/2015   Chronic pulmonary embolism (HCC) 12/05/2008    REFERRING DIAG: left breast cancer at risk for lymphedema  THERAPY DIAG: Aftercare following surgery for neoplasm  PERTINENT HISTORY:  Patient was diagnosed on 11/09/2022 with left grade 1 invasive ductal carcinoma breast cancer. She underwent a left lumpectomy and sentinel node biopsy on 12/21/2022. It is ER/PR positive and HER2 negative with a Ki67 of 10%.  PRECAUTIONS: left UE Lymphedema risk, None  SUBJECTIVE: Pt returns for her second 1 month reassess due to high change from baseline x 2. This new cancer medicine they've got me on (pt reports starting Tamoxifen x 1 month ago) is causing me so much brain fog. I've been wearing my compression sleeve 10 hrs/day, sometimes at night.  PAIN:  Are you having pain? No  SOZO SCREENING: Patient was assessed today using the SOZO machine to determine the lymphedema index score. This was compared to her baseline score. It was determined that she is within the recommended range when compared to her baseline and no further action is needed at this time. She will continue SOZO screenings. These are done every 3 months for  2 years post operatively followed by every 6 months for 2 years, and then annually.  Reviewed proper time to wear sleeve which is during day when awake so as to be worn during time of muscle pumping action. Pt able to verbalize better understanding after further explanation. She feels the recent brain fog she has been experiencing was part of her confusion.   L-DEX FLOWSHEETS - 05/02/24 1500       L-DEX LYMPHEDEMA SCREENING   Measurement Type Unilateral    L-DEX MEASUREMENT EXTREMITY Upper Extremity    POSITION  Standing    DOMINANT SIDE Right    At Risk Side Left    BASELINE SCORE (UNILATERAL) 1.3    L-DEX SCORE (UNILATERAL) -0.3    VALUE CHANGE (UNILAT) -1.6         P: Resume every 3 month SOZO screens until April 2026, then transition to every 6 months x 2 more years.   Aden Berwyn Caldron, PTA 05/02/2024, 3:17 PM

## 2024-05-03 NOTE — Progress Notes (Unsigned)
  Electrophysiology Office Note:   Date:  05/04/2024  ID:  Pamela Davenport, DOB 1956-06-02, MRN [id number]  Primary Cardiologist: None Electrophysiologist: OLE ONEIDA HOLTS, MD   Electrophysiologist:  OLE ONEIDA HOLTS, MD      History of Present Illness:   Pamela Davenport is a 68 y.o. female with h/o PAF, PVCs, HTN, and Breast CA seen today for routine electrophysiology followup.   Since last being seen in our clinic the patient reports doing well from a cardiac perspective. Has occasional brief ectopy, but no sustained arrhythmia. Has not needed flecainide . Mostly bothered by her RA and neuropathy. Otherwise, she denies chest pain, dyspnea, PND, orthopnea, nausea, vomiting, dizziness, syncope, edema, weight gain, or early satiety.   Review of systems complete and found to be negative unless listed in HPI.   EP Information / Studies Reviewed:    EKG is ordered today. Personal review as below.  EKG Interpretation Date/Time:  Friday May 04 2024 11:50:03 EDT Ventricular Rate:  71 PR Interval:  176 QRS Duration:  94 QT Interval:  398 QTC Calculation: 432 R Axis:   61  Text Interpretation: Normal sinus rhythm Incomplete right bundle branch block When compared with ECG of 25-Jul-2023 11:04, No significant change was found Confirmed by Lesia Sharper [id number]) on 05/04/2024 11:54:54 AM    Arrhythmia/Device History No specialty comments available.   Physical Exam:   VS:  BP 110/74   Pulse 71   Ht 5' 9 (1.753 m)   Wt 260 lb (117.9 kg)   SpO2 96%   BMI 38.40 kg/m    Wt Readings from Last 3 Encounters:  05/04/24 260 lb (117.9 kg)  05/02/24 258 lb 4 oz (117.1 kg)  03/26/24 260 lb 8 oz (118.2 kg)     GEN: No acute distress NECK: No JVD; No carotid bruits CARDIAC: Regular rate and rhythm, no murmurs, rubs, gallops RESPIRATORY:  Clear to auscultation without rales, wheezing or rhonchi  ABDOMEN: Soft, non-tender, non-distended EXTREMITIES:  No edema; No deformity    ASSESSMENT AND PLAN:    Paroxysmal AF High risk medication monitoring - Flecainide  EKG today shows NSR with stable intervals Continue Xarelto  for CHA2DS2VASc of at least 5 Continue nadolol  20 mg daily Continue flecainide  PiP  PVCs Continue nadolol    HTN Stable on current regimen   Follow up with EP Team in 6 months  Signed, Sharper Prentice Lesia, PA-C

## 2024-05-04 ENCOUNTER — Ambulatory Visit: Attending: Student | Admitting: Student

## 2024-05-04 ENCOUNTER — Other Ambulatory Visit: Payer: Self-pay | Admitting: Internal Medicine

## 2024-05-04 ENCOUNTER — Encounter: Payer: Self-pay | Admitting: Student

## 2024-05-04 ENCOUNTER — Other Ambulatory Visit: Payer: Self-pay | Admitting: Rheumatology

## 2024-05-04 VITALS — BP 110/74 | HR 71 | Ht 69.0 in | Wt 260.0 lb

## 2024-05-04 DIAGNOSIS — I10 Essential (primary) hypertension: Secondary | ICD-10-CM | POA: Diagnosis not present

## 2024-05-04 DIAGNOSIS — E785 Hyperlipidemia, unspecified: Secondary | ICD-10-CM

## 2024-05-04 DIAGNOSIS — I480 Paroxysmal atrial fibrillation: Secondary | ICD-10-CM | POA: Diagnosis not present

## 2024-05-04 DIAGNOSIS — I493 Ventricular premature depolarization: Secondary | ICD-10-CM | POA: Diagnosis not present

## 2024-05-04 NOTE — Patient Instructions (Signed)
 Medication Instructions:  Your physician recommends that you continue on your current medications as directed. Please refer to the Current Medication list given to you today.  *If you need a refill on your cardiac medications before your next appointment, please call your pharmacy*  Lab Work: None ordered If you have labs (blood work) drawn today and your tests are completely normal, you will receive your results only by: MyChart Message (if you have MyChart) OR A paper copy in the mail If you have any lab test that is abnormal or we need to change your treatment, we will call you to review the results  Follow-Up: At [facility], you and your health needs are our priority.  As part of our continuing mission to provide you with exceptional heart care, our providers are all part of one team.  This team includes your primary Cardiologist (physician) and Advanced Practice Providers or APPs (Physician Assistants and Nurse Practitioners) who all work together to provide you with the care you need, when you need it.  Your next appointment:   6 month(s)  Provider:   Ole Holts, MD

## 2024-05-06 ENCOUNTER — Other Ambulatory Visit: Payer: Self-pay | Admitting: Cardiology

## 2024-05-06 ENCOUNTER — Other Ambulatory Visit: Payer: Self-pay | Admitting: Physician Assistant

## 2024-05-06 DIAGNOSIS — I480 Paroxysmal atrial fibrillation: Secondary | ICD-10-CM

## 2024-05-06 DIAGNOSIS — M0579 Rheumatoid arthritis with rheumatoid factor of multiple sites without organ or systems involvement: Secondary | ICD-10-CM

## 2024-05-07 ENCOUNTER — Ambulatory Visit: Admitting: Neurology

## 2024-05-07 NOTE — Telephone Encounter (Signed)
 Prescription refill request for Xarelto  received.  Indication:afib Last office visit:9/25 Weight:117.9  kg Age:68 Scr:1.17  8/25 CrCl:85.65  ml/min  Prescription refilled

## 2024-05-07 NOTE — Telephone Encounter (Signed)
 Last Fill: 02/20/2024  Eye exam: 01/19/2024   Labs: 04/12/2024 Creatinine is mildly elevated and stable. CBC normal, hemoglobin A1c normal.   Next Visit: 06/26/2024  Last Visit: 01/24/2024  IK:Myzlfjunpi arthritis with rheumatoid factor of multiple sites without organ or systems involvement   Current Dose per office note on 01/24/2024: Plaquenil  200 mg 1 tablet by mouth twice daily.   Okay to refill Plaquenil ?

## 2024-05-09 ENCOUNTER — Other Ambulatory Visit: Payer: Self-pay | Admitting: Internal Medicine

## 2024-05-09 DIAGNOSIS — K2100 Gastro-esophageal reflux disease with esophagitis, without bleeding: Secondary | ICD-10-CM

## 2024-05-23 DIAGNOSIS — R350 Frequency of micturition: Secondary | ICD-10-CM | POA: Diagnosis not present

## 2024-05-23 DIAGNOSIS — N3941 Urge incontinence: Secondary | ICD-10-CM | POA: Diagnosis not present

## 2024-05-25 ENCOUNTER — Ambulatory Visit
Admission: EM | Admit: 2024-05-25 | Discharge: 2024-05-25 | Disposition: A | Discharge status: Home / Self Care | Attending: Family Medicine | Admitting: Family Medicine

## 2024-05-25 DIAGNOSIS — L739 Follicular disorder, unspecified: Secondary | ICD-10-CM | POA: Diagnosis not present

## 2024-05-25 MED ORDER — DOXYCYCLINE HYCLATE 100 MG PO CAPS: 100.0000 mg | ORAL_CAPSULE | Freq: Two times a day (BID) | ORAL | 0 refills | Status: DC

## 2024-05-25 NOTE — ED Provider Notes (Signed)
 [facility]  Note:  This document was prepared using Conservation officer, historic buildings and may include unintentional dictation errors.  MRN: [id number] DOB: February 27, 1956  Subjective:   Pamela Davenport is a 68 y.o. female presenting for 3 days story of irritation about her pubic hair.  Patient has had some spots that are really sore and draining or bleed.  Symptoms started after she had to start wearing pads following treatment of breast cancer.  Has a recurrence of infections like this from irritation using these pads.  No history of hidradenitis.  No current facility-administered medications for this encounter.  Current Outpatient Medications:    ALPRAZolam  (XANAX ) 0.5 MG tablet, Take 1 tablet (0.5 mg total) by mouth 2 (two) times daily. (Patient taking differently: Take 0.5 mg by mouth as needed.), Disp: 60 tablet, Rfl: 2   amLODipine  (NORVASC ) 10 MG tablet, Take 1 tablet (10 mg total) by mouth at bedtime., Disp: 90 tablet, Rfl: 2   Black Pepper-Turmeric (TURMERIC PLUS BLACK PEPPER EXT PO), Take by mouth daily., Disp: , Rfl:    Cholecalciferol  (VITAMIN D -3) 25 MCG (1000 UT) CAPS, Take 2 capsules (2,000 Units total) by mouth daily., Disp: 180 capsule, Rfl: 1   esomeprazole  (NEXIUM ) 40 MG capsule, TAKE 1 CAPSULE (40 MG TOTAL) BY MOUTH DAILY., Disp: 90 capsule, Rfl: 3   exemestane  (AROMASIN ) 25 MG tablet, Take 1 tablet (25 mg total) by mouth daily after breakfast., Disp: 90 tablet, Rfl: 3   flecainide  (TAMBOCOR ) 150 MG tablet, Take 2 tablets by mouth at the onset of AFIB, Disp: 30 tablet, Rfl: 3   gabapentin  (NEURONTIN ) 100 MG capsule, Take 1 capsule (100 mg total) by mouth daily., Disp: 90 capsule, Rfl: 3   gabapentin  (NEURONTIN ) 300 MG capsule, Take 300mg  at bedtime., Disp: 90 capsule, Rfl: 3   hydroxychloroquine  (PLAQUENIL ) 200 MG tablet, TAKE 1 TABLET TWICE DAILY, Disp: 180 tablet, Rfl: 0   hydrOXYzine  (VISTARIL ) 50 MG capsule, Take 1 capsule (50 mg total) by mouth 3  (three) times daily as needed., Disp: 60 capsule, Rfl: 2   irbesartan  (AVAPRO ) 300 MG tablet, Take 1 tablet by mouth once daily, Disp: 90 tablet, Rfl: 1   leflunomide  (ARAVA ) 20 MG tablet, Take 1 tablet (20 mg total) by mouth daily., Disp: 90 tablet, Rfl: 0   nadolol  (CORGARD ) 20 MG tablet, Take 1 tablet (20 mg total) by mouth daily., Disp: 90 tablet, Rfl: 2   nystatin -triamcinolone  ointment (MYCOLOG), Apply to affected areas two times a day (Patient taking differently: Apply topically as needed.), Disp: 20 g, Rfl: 2   OVER THE COUNTER MEDICATION, 1 tablet 2 (two) times daily. [employer], Disp: , Rfl:    rosuvastatin  (CRESTOR ) 10 MG tablet, TAKE 1 TABLET EVERY DAY, Disp: 90 tablet, Rfl: 3   solifenacin (VESICARE) 5 MG tablet, Take 5 mg by mouth daily., Disp: , Rfl:    TART CHERRY PO, Take 2 tablets by mouth daily., Disp: , Rfl:    thiamine  (VITAMIN B-1) 50 MG tablet, Take 1 tablet (50 mg total) by mouth daily., Disp: 90 tablet, Rfl: 1   XARELTO  20 MG TABS tablet, TAKE 1 TABLET (20 MG TOTAL) BY MOUTH DAILY WITH SUPPER., Disp: 90 tablet, Rfl: 3   Allergies  Allergen Reactions   Methylprednisolone      REACTION: Atrial fibrillation at end of medrol  dose pack   Sulfa Antibiotics Hives   Sulfonamide Derivatives Hives   Macrobid  [Nitrofurantoin ]     Muscle tightening and cramps  Past Medical History:  Diagnosis Date   Atypical chest pain    normal myoview  11/11/10   Blood transfusion without reported diagnosis    Cancer (HCC)    DVT (deep venous thrombosis) (HCC)    recurrent; chronically anticoagulated with coumadin    Dysrhythmia    Family history of anesthesia complication    Sister had decrease in respirations after surgery   Heart murmur    Herniated lumbar intervertebral disc    Hypertension    Paroxysmal atrial fibrillation (HCC)    SINCE AGE 60    PE (pulmonary embolism)    chronically anticoagulated with coumadin    Pneumonia 07/20/2012   PONV (postoperative nausea and  vomiting)    Rheumatoid arthritis (HCC)    Sickle cell trait    Sinus congestion 08/14/2018   C/O NASAL/CHEST  CONGESTION, COUGHING WITH PHLEGM, PER PATIENT , SHE IS TO BEGIN TAKING AUGMENTIN  DOSE PACK TODAY;     Strep throat at age 20   The patient reports a severe strep throat infection at age 49 and   is not clear as to whether or not she may have had rheumatic fever.   Symptomatic premature ventricular contractions    improved s/p ablation   Urgency of urination    Uterine bleeding 2008   Uterine artery embolization in 2008 for uterine bleeding.      Past Surgical History:  Procedure Laterality Date   BREAST BIOPSY Left 11/08/2022   US  LT BREAST BX W LOC DEV EA ADD LESION IMG BX SPEC US  GUIDE 11/08/2022 [facility]   BREAST BIOPSY Left 11/08/2022   US  LT BREAST BX W LOC DEV 1ST LESION IMG BX SPEC US  GUIDE 11/08/2022 [facility]   BREAST BIOPSY Left 12/17/2022   US  LT RADIOACTIVE SEED LOC 12/17/2022 [facility]   BREAST BIOPSY Left 12/17/2022   US  LT RADIOACTIVE SEED EA ADD LESION 12/17/2022 [facility]   BREAST BIOPSY Left 12/17/2022   US  LT RADIOACTIVE SEED EA ADD LESION 12/17/2022 [facility]   BREAST LUMPECTOMY WITH RADIOACTIVE SEED AND SENTINEL LYMPH NODE BIOPSY Left 12/21/2022   Procedure: LEFT BREAST BRACKETED SEED LUMPECTOMY AND LEFT SENTINEL LYMPH NODE MAPPING;  Surgeon: Vanderbilt Ned, MD;  Location: [facility];  Service: General;  Laterality: Left;  GEN & PEC BLOCK   COLONOSCOPY W/ POLYPECTOMY     Diagnostic D&C hysteroscopy and Novasure ablation  03/17/2004   DILATION AND CURETTAGE OF UTERUS  12/17/2011   Procedure: DILATATION AND CURETTAGE;  Surgeon: Aida DELENA Na, MD;  Location: [facility];  Service: Gynecology;  Laterality: N/A;  With Attempted Hydrothermal Ablation   LUMBAR LAMINECTOMY/DECOMPRESSION MICRODISCECTOMY Left 02/21/2013   Procedure: LUMBAR LAMINECTOMY/DECOMPRESSION MICRODISCECTOMY 1 LEVEL;  Surgeon: Rockey LITTIE Peru,  MD;  Location: [facility];  Service: Neurosurgery;  Laterality: Left;  LEFT Lumbar Three-Four Laminotomy foraminotomy microdiskectomy   MANDIBLE SURGERY     under bite   PVC Ablation  2010   ROBOTIC ASSISTED TOTAL HYSTERECTOMY WITH BILATERAL SALPINGO OOPHERECTOMY Bilateral 08/21/2018   Procedure: XI ROBOTIC ASSISTED TOTAL HYSTERECTOMY WITH BILATERAL SALPINGO OOPHORECTOMY;  Surgeon: Eloy Herring, MD;  Location: [facility];  Service: Gynecology;  Laterality: Bilateral;   SPINE SURGERY     TONSILLECTOMY     UTERINE ARTERY EMBOLIZATION  2008   for uterine bleeding   VASCULAR SURGERY     laser of left leg    Family History  Problem Relation Age of Onset   Hypertension Mother    Diabetes Mother  Uterine cancer Sister 2   Prostate cancer Brother 50   Prostate cancer Brother 63   Prostate cancer Brother    Prostate cancer Maternal Uncle    Prostate cancer Maternal Uncle    Non-Hodgkin's lymphoma Paternal Aunt 7   Pancreatic cancer Paternal Aunt 45   Colon cancer Paternal Grandmother 5   Healthy Son    Breast cancer Neg Hx     Social History   Tobacco Use   Smoking status: Former    Current packs/day: 0.00    Average packs/day: 1 pack/day for 2.0 years (2.0 ttl pk-yrs)    Types: Cigarettes    Start date: 02/12/1977    Quit date: 02/13/1979    Years since quitting: 45.3    Passive exposure: Never   Smokeless tobacco: Never  Vaping Use   Vaping status: Never Used  Substance Use Topics   Alcohol use: Not Currently    Comment: rarely *has stopped due to breast cancer medicine*   Drug use: No    ROS   Objective:   Vitals: BP (!) 150/80 (BP Location: Right Arm)   Pulse 87   Temp 97.9 F (36.6 C) (Oral)   Resp 18   SpO2 94%   Physical Exam Constitutional:      General: She is not in acute distress.    Appearance: Normal appearance. She is well-developed. She is not ill-appearing, toxic-appearing or diaphoretic.  HENT:     Head: Normocephalic  and atraumatic.     Nose: Nose normal.     Mouth/Throat:     Mouth: Mucous membranes are moist.  Eyes:     General: No scleral icterus.       Right eye: No discharge.        Left eye: No discharge.     Extraocular Movements: Extraocular movements intact.  Cardiovascular:     Rate and Rhythm: Normal rate.  Pulmonary:     Effort: Pulmonary effort is normal.  Skin:    General: Skin is warm and dry.      Neurological:     General: No focal deficit present.     Mental Status: She is alert and oriented to person, place, and time.  Psychiatric:        Mood and Affect: Mood normal.        Behavior: Behavior normal.     Assessment and Plan :   PDMP not reviewed this encounter.  1. Folliculitis    No appreciable abscess.  Will manage for folliculitis with doxycycline .  Anticipatory guidance provided. Counseled patient on potential for adverse effects with medications prescribed/recommended today, ER and return-to-clinic precautions discussed, patient verbalized understanding.     Christopher Savannah, [place] 05/25/24 1642

## 2024-05-25 NOTE — ED Triage Notes (Signed)
 Pt reports she has 3 abscess in the groin are x 2-3 days. States one of the abscess has some drainage. Pt reports she is having this abscess here and there and is more often after she had radiation fr breast cancer July 2024.  Pt reports she uses pads and some of the areas the pad touch her skin sh has the abscess.

## 2024-06-12 NOTE — Progress Notes (Deleted)
 Office Visit Note  Patient: Pamela Davenport             Date of Birth: 05/28/1956           MRN: [id number]             PCP: Joshua Debby CROME, MD Referring: Joshua Debby CROME, MD Visit Date: 06/26/2024 Occupation: Data Unavailable  Subjective:  No chief complaint on file.   History of Present Illness: Pamela Davenport is a 68 y.o. female ***     Activities of Daily Living:  Patient reports morning stiffness for *** {minute/hour:19697}.   Patient {ACTIONS;DENIES/REPORTS:21021675::Denies} nocturnal pain.  Difficulty dressing/grooming: {ACTIONS;DENIES/REPORTS:21021675::Denies} Difficulty climbing stairs: {ACTIONS;DENIES/REPORTS:21021675::Denies} Difficulty getting out of chair: {ACTIONS;DENIES/REPORTS:21021675::Denies} Difficulty using hands for taps, buttons, cutlery, and/or writing: {ACTIONS;DENIES/REPORTS:21021675::Denies}  No Rheumatology ROS completed.   PMFS History:  Patient Active Problem List   Diagnosis Date Noted   Chronic hyperglycemia 01/06/2024   Deficiency anemia 12/20/2023   Malignant neoplasm of lower-inner quadrant of left breast in female, estrogen receptor positive (HCC) 11/15/2022   Anemia due to acquired thiamine  deficiency 05/07/2022   Gastroesophageal reflux disease with esophagitis without hemorrhage 05/04/2022   Esophageal dysphagia 05/04/2022   OAB (overactive bladder) 12/31/2021   Encounter for general adult medical examination with abnormal findings 08/26/2021   PAF (paroxysmal atrial fibrillation) (HCC) 08/26/2021   GERD without esophagitis 10/04/2019   Vitamin D  deficiency disease 09/25/2019   Hypercoagulable state 09/24/2019   Iron deficiency anemia 12/07/2018   Other dietary vitamin B12 deficiency anemia 12/07/2018   Visit for screening mammogram 04/18/2017   Hyperlipidemia with target LDL less than 130 10/19/2016   Essential hypertension 01/13/2015   Chronic pulmonary embolism (HCC) 12/05/2008    Past Medical History:   Diagnosis Date   Atypical chest pain    normal myoview  11/11/10   Blood transfusion without reported diagnosis    Cancer (HCC)    DVT (deep venous thrombosis) (HCC)    recurrent; chronically anticoagulated with coumadin    Dysrhythmia    Family history of anesthesia complication    Sister had decrease in respirations after surgery   Heart murmur    Herniated lumbar intervertebral disc    Hypertension    Paroxysmal atrial fibrillation (HCC)    SINCE AGE 60    PE (pulmonary embolism)    chronically anticoagulated with coumadin    Pneumonia 07/20/2012   PONV (postoperative nausea and vomiting)    Rheumatoid arthritis (HCC)    Sickle cell trait    Sinus congestion 08/14/2018   C/O NASAL/CHEST  CONGESTION, COUGHING WITH PHLEGM, PER PATIENT , SHE IS TO BEGIN TAKING AUGMENTIN  DOSE PACK TODAY;     Strep throat at age 22   The patient reports a severe strep throat infection at age 59 and   is not clear as to whether or not she may have had rheumatic fever.   Symptomatic premature ventricular contractions    improved s/p ablation   Urgency of urination    Uterine bleeding 2008   Uterine artery embolization in 2008 for uterine bleeding.     Family History  Problem Relation Age of Onset   Hypertension Mother    Diabetes Mother    Uterine cancer Sister 45   Prostate cancer Brother 58   Prostate cancer Brother 39   Prostate cancer Brother    Prostate cancer Maternal Uncle    Prostate cancer Maternal Uncle    Non-Hodgkin's lymphoma Paternal Aunt 60   Pancreatic cancer Paternal Aunt 27  Colon cancer Paternal Grandmother 35   Healthy Son    Breast cancer Neg Hx    Past Surgical History:  Procedure Laterality Date   BREAST BIOPSY Left 11/08/2022   US  LT BREAST BX W LOC DEV EA ADD LESION IMG BX SPEC US  GUIDE 11/08/2022 [facility]   BREAST BIOPSY Left 11/08/2022   US  LT BREAST BX W LOC DEV 1ST LESION IMG BX SPEC US  GUIDE 11/08/2022 [facility]   BREAST BIOPSY Left  12/17/2022   US  LT RADIOACTIVE SEED LOC 12/17/2022 [facility]   BREAST BIOPSY Left 12/17/2022   US  LT RADIOACTIVE SEED EA ADD LESION 12/17/2022 [facility]   BREAST BIOPSY Left 12/17/2022   US  LT RADIOACTIVE SEED EA ADD LESION 12/17/2022 [facility]   BREAST LUMPECTOMY WITH RADIOACTIVE SEED AND SENTINEL LYMPH NODE BIOPSY Left 12/21/2022   Procedure: LEFT BREAST BRACKETED SEED LUMPECTOMY AND LEFT SENTINEL LYMPH NODE MAPPING;  Surgeon: Vanderbilt Ned, MD;  Location: [facility];  Service: General;  Laterality: Left;  GEN & PEC BLOCK   COLONOSCOPY W/ POLYPECTOMY     Diagnostic D&C hysteroscopy and Novasure ablation  03/17/2004   DILATION AND CURETTAGE OF UTERUS  12/17/2011   Procedure: DILATATION AND CURETTAGE;  Surgeon: Aida DELENA Na, MD;  Location: [facility];  Service: Gynecology;  Laterality: N/A;  With Attempted Hydrothermal Ablation   LUMBAR LAMINECTOMY/DECOMPRESSION MICRODISCECTOMY Left 02/21/2013   Procedure: LUMBAR LAMINECTOMY/DECOMPRESSION MICRODISCECTOMY 1 LEVEL;  Surgeon: Rockey LITTIE Peru, MD;  Location: [facility];  Service: Neurosurgery;  Laterality: Left;  LEFT Lumbar Three-Four Laminotomy foraminotomy microdiskectomy   MANDIBLE SURGERY     under bite   PVC Ablation  2010   ROBOTIC ASSISTED TOTAL HYSTERECTOMY WITH BILATERAL SALPINGO OOPHERECTOMY Bilateral 08/21/2018   Procedure: XI ROBOTIC ASSISTED TOTAL HYSTERECTOMY WITH BILATERAL SALPINGO OOPHORECTOMY;  Surgeon: Eloy Herring, MD;  Location: [facility] [place];  Service: Gynecology;  Laterality: Bilateral;   SPINE SURGERY     TONSILLECTOMY     UTERINE ARTERY EMBOLIZATION  2008   for uterine bleeding   VASCULAR SURGERY     laser of left leg   Social History   Tobacco Use   Smoking status: Former    Current packs/day: 0.00    Average packs/day: 1 pack/day for 2.0 years (2.0 ttl pk-yrs)    Types: Cigarettes    Start date: 02/12/1977    Quit date: 02/13/1979    Years since quitting: 45.3     Passive exposure: Never   Smokeless tobacco: Never  Vaping Use   Vaping status: Never Used  Substance Use Topics   Alcohol use: Not Currently    Comment: rarely *has stopped due to breast cancer medicine*   Drug use: No   Social History   Social History Narrative   Works as a Engineer, civil (consulting) at [employer]      Are you right handed or left handed? Right Handed     Are you currently employed ? 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   Do you live at home alone? No    Who lives with you? Brother   What type of home do you live in: 1 story or 2 story? Lives in a two story home          Immunization History  Administered Date(s) Administered   Influenza,inj,Quad PF,6+ Mos 06/14/2019   Influenza-Unspecified 05/05/2021   PFIZER(Purple Top)SARS-COV-2 Vaccination 11/08/2019, 11/30/2019, 08/19/2020   PNEUMOCOCCAL  CONJUGATE-20 08/26/2021   Tdap 02/03/2015   Zoster Recombinant(Shingrix) 08/26/2021     Objective: Vital Signs: There were no vitals taken for this visit.   Physical Exam   Musculoskeletal Exam: ***  CDAI Exam: CDAI Score: -- Patient Global: --; Provider Global: -- Swollen: --; Tender: -- Joint Exam 06/26/2024   No joint exam has been documented for this visit   There is currently no information documented on the homunculus. Go to the Rheumatology activity and complete the homunculus joint exam.  Investigation: No additional findings.  Imaging: No results found.  Recent Labs: Lab Results  Component Value Date   WBC 6.5 04/12/2024   HGB 11.9 04/12/2024   PLT 229 04/12/2024   NA 140 04/12/2024   K 4.4 04/12/2024   CL 107 04/12/2024   CO2 23 04/12/2024   GLUCOSE 83 04/12/2024   BUN 20 04/12/2024   CREATININE 1.17 (H) 04/12/2024   BILITOT 0.3 04/12/2024   ALKPHOS 75 01/09/2024   AST 23 04/12/2024   ALT 16 04/12/2024   PROT 7.6 04/12/2024   ALBUMIN 4.0 01/09/2024   CALCIUM  10.1 04/12/2024   GFRAA 72  11/13/2020   QFTBGOLDPLUS NEGATIVE 01/15/2019    Speciality Comments: PLQ Eye Exam: 01/19/2024 WNL @ [facility]  Follow up in 1 year. Hydroxychloroquine  since May 2020  Procedures:  No procedures performed Allergies: Methylprednisolone , Sulfa antibiotics, Sulfonamide derivatives, and Macrobid  [nitrofurantoin ]   Assessment / Plan:     Visit Diagnoses: Rheumatoid arthritis with rheumatoid factor of multiple sites without organ or systems involvement (HCC)  High risk medication use  Elevated serum creatinine  Chronic pain of both shoulders  Primary osteoarthritis of both hands  Primary osteoarthritis of both knees  Primary osteoarthritis of both feet  Paresthesia of both feet  Positive ANA (antinuclear antibody)  Everitt Quervain's disease (radial styloid tenosynovitis) right  History of DVT (deep vein thrombosis)  History of pulmonary embolism  Vitamin D  deficiency  Hyperlipidemia with target LDL less than 130  History of atrial fibrillation  Essential hypertension  PVC's (premature ventricular contractions)  History of anemia  History of recent pneumonia  Orders: No orders of the defined types were placed in this encounter.  No orders of the defined types were placed in this encounter.   Face-to-face time spent with patient was *** minutes. Greater than 50% of time was spent in counseling and coordination of care.  Follow-Up Instructions: No follow-ups on file.   Waddell CHRISTELLA Craze, PA-C  Note - This record has been created using Dragon software.  Chart creation errors have been sought, but may not always  have been located. Such creation errors do not reflect on  the standard of medical care.

## 2024-06-15 ENCOUNTER — Other Ambulatory Visit: Payer: Self-pay | Admitting: Internal Medicine

## 2024-06-15 DIAGNOSIS — E559 Vitamin D deficiency, unspecified: Secondary | ICD-10-CM

## 2024-06-24 NOTE — Progress Notes (Unsigned)
 Office Visit Note  Patient: Pamela Davenport             Date of Birth: February 13, 1956           MRN: [id number]             PCP: Joshua Debby CROME, MD Referring: Joshua Debby CROME, MD Visit Date: 06/25/2024 Occupation: Data Unavailable  Subjective:  Medication monitoring   History of Present Illness: Pamela Davenport is a 68 y.o. female with history of rheumatoid arthritis.  Patient remains on  Arava  20mg  by mouth daily and Plaquenil  200 mg 1 tablet by mouth twice daily.  She is tolerating combination therapy without any side effects.  Patient experiences intermittent arthralgias and joint stiffness but overall her symptoms have been manageable on the current treatment regimen.  Patient has noticed intermittent locking of the right thumb.  Patient states that her primary concern remains neuropathy involving both feet.  Her symptoms are most severe at night that she has difficulty walking throughout the day due to the symptoms.  She had a recent fall due to numbness affecting the left foot.  Patient has requested paperwork for a handicap placard.  She is taking gabapentin  as prescribed.  Activities of Daily Living:  Patient reports morning stiffness for 30 minutes.   Patient Reports nocturnal pain.  Difficulty dressing/grooming: Denies Difficulty climbing stairs: Reports Difficulty getting out of chair: Reports Difficulty using hands for taps, buttons, cutlery, and/or writing: Reports  Review of Systems  Constitutional:  Positive for fatigue.  HENT:  Positive for mouth dryness. Negative for mouth sores.   Eyes:  Positive for dryness.  Respiratory:  Positive for shortness of breath.   Cardiovascular:  Positive for chest pain and palpitations.  Gastrointestinal:  Positive for constipation. Negative for blood in stool and diarrhea.  Endocrine: Negative for increased urination.  Genitourinary:  Positive for involuntary urination.  Musculoskeletal:  Positive for joint pain, joint pain, joint  swelling, myalgias, muscle weakness, morning stiffness and myalgias. Negative for gait problem and muscle tenderness.  Skin:  Positive for color change. Negative for rash, hair loss and sensitivity to sunlight.  Allergic/Immunologic: Positive for susceptible to infections.  Neurological:  Positive for headaches. Negative for dizziness.  Hematological:  Negative for swollen glands.  Psychiatric/Behavioral:  Positive for sleep disturbance. Negative for depressed mood. The patient is nervous/anxious.     PMFS History:  Patient Active Problem List   Diagnosis Date Noted   Chronic hyperglycemia 01/06/2024   Deficiency anemia 12/20/2023   Malignant neoplasm of lower-inner quadrant of left breast in female, estrogen receptor positive (HCC) 11/15/2022   Anemia due to acquired thiamine  deficiency 05/07/2022   Gastroesophageal reflux disease with esophagitis without hemorrhage 05/04/2022   Esophageal dysphagia 05/04/2022   OAB (overactive bladder) 12/31/2021   Encounter for general adult medical examination with abnormal findings 08/26/2021   PAF (paroxysmal atrial fibrillation) (HCC) 08/26/2021   GERD without esophagitis 10/04/2019   Vitamin D  deficiency disease 09/25/2019   Hypercoagulable state 09/24/2019   Iron deficiency anemia 12/07/2018   Other dietary vitamin B12 deficiency anemia 12/07/2018   Visit for screening mammogram 04/18/2017   Hyperlipidemia with target LDL less than 130 10/19/2016   Essential hypertension 01/13/2015   Chronic pulmonary embolism (HCC) 12/05/2008    Past Medical History:  Diagnosis Date   Atypical chest pain    normal myoview  11/11/10   Blood transfusion without reported diagnosis    Cancer (HCC)    DVT (deep venous thrombosis) (HCC)  recurrent; chronically anticoagulated with coumadin    Dysrhythmia    Family history of anesthesia complication    Sister had decrease in respirations after surgery   Heart murmur    Herniated lumbar intervertebral disc     Hypertension    Neuropathy    Paroxysmal atrial fibrillation (HCC)    SINCE AGE 66    PE (pulmonary embolism)    chronically anticoagulated with coumadin    Pneumonia 07/20/2012   PONV (postoperative nausea and vomiting)    Rheumatoid arthritis (HCC)    Sickle cell trait    Sinus congestion 08/14/2018   C/O NASAL/CHEST  CONGESTION, COUGHING WITH PHLEGM, PER PATIENT , SHE IS TO BEGIN TAKING AUGMENTIN  DOSE PACK TODAY;     Strep throat at age 63   The patient reports a severe strep throat infection at age 53 and   is not clear as to whether or not she may have had rheumatic fever.   Symptomatic premature ventricular contractions    improved s/p ablation   Urgency of urination    Uterine bleeding 2008   Uterine artery embolization in 2008 for uterine bleeding.     Family History  Problem Relation Age of Onset   Hypertension Mother    Diabetes Mother    Uterine cancer Sister 46   Prostate cancer Brother 67   Prostate cancer Brother 79   Prostate cancer Brother    Prostate cancer Maternal Uncle    Prostate cancer Maternal Uncle    Non-Hodgkin's lymphoma Paternal Aunt 29   Pancreatic cancer Paternal Aunt 38   Colon cancer Paternal Grandmother 15   Healthy Son    Breast cancer Neg Hx    Past Surgical History:  Procedure Laterality Date   BREAST BIOPSY Left 11/08/2022   US  LT BREAST BX W LOC DEV EA ADD LESION IMG BX SPEC US  GUIDE 11/08/2022 [facility]   BREAST BIOPSY Left 11/08/2022   US  LT BREAST BX W LOC DEV 1ST LESION IMG BX SPEC US  GUIDE 11/08/2022 [facility]   BREAST BIOPSY Left 12/17/2022   US  LT RADIOACTIVE SEED LOC 12/17/2022 [facility]   BREAST BIOPSY Left 12/17/2022   US  LT RADIOACTIVE SEED EA ADD LESION 12/17/2022 [facility]   BREAST BIOPSY Left 12/17/2022   US  LT RADIOACTIVE SEED EA ADD LESION 12/17/2022 [facility]   BREAST LUMPECTOMY WITH RADIOACTIVE SEED AND SENTINEL LYMPH NODE BIOPSY Left 12/21/2022   Procedure: LEFT BREAST  BRACKETED SEED LUMPECTOMY AND LEFT SENTINEL LYMPH NODE MAPPING;  Surgeon: Vanderbilt Ned, MD;  Location: [facility];  Service: General;  Laterality: Left;  GEN & PEC BLOCK   COLONOSCOPY W/ POLYPECTOMY     Diagnostic D&C hysteroscopy and Novasure ablation  03/17/2004   DILATION AND CURETTAGE OF UTERUS  12/17/2011   Procedure: DILATATION AND CURETTAGE;  Surgeon: Aida DELENA Na, MD;  Location: [facility];  Service: Gynecology;  Laterality: N/A;  With Attempted Hydrothermal Ablation   LUMBAR LAMINECTOMY/DECOMPRESSION MICRODISCECTOMY Left 02/21/2013   Procedure: LUMBAR LAMINECTOMY/DECOMPRESSION MICRODISCECTOMY 1 LEVEL;  Surgeon: Rockey LITTIE Peru, MD;  Location: [facility];  Service: Neurosurgery;  Laterality: Left;  LEFT Lumbar Three-Four Laminotomy foraminotomy microdiskectomy   MANDIBLE SURGERY     under bite   PVC Ablation  2010   ROBOTIC ASSISTED TOTAL HYSTERECTOMY WITH BILATERAL SALPINGO OOPHERECTOMY Bilateral 08/21/2018   Procedure: XI ROBOTIC ASSISTED TOTAL HYSTERECTOMY WITH BILATERAL SALPINGO OOPHORECTOMY;  Surgeon: Eloy Herring, MD;  Location: [facility] ;  Service: Gynecology;  Laterality: Bilateral;  SPINE SURGERY     TONSILLECTOMY     UTERINE ARTERY EMBOLIZATION  2008   for uterine bleeding   VASCULAR SURGERY     laser of left leg   Social History   Tobacco Use   Smoking status: Former    Current packs/day: 0.00    Average packs/day: 1 pack/day for 2.0 years (2.0 ttl pk-yrs)    Types: Cigarettes    Start date: 02/12/1977    Quit date: 02/13/1979    Years since quitting: 45.3    Passive exposure: Never   Smokeless tobacco: Never  Vaping Use   Vaping status: Never Used  Substance Use Topics   Alcohol use: Not Currently    Comment: rarely *has stopped due to breast cancer medicine*   Drug use: No   Social History   Social History Narrative   Works as a engineer, civil (consulting) at [employer]      Are you right handed or left handed? Right Handed     Are you  currently employed ? 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   Do you live at home alone? No    Who lives with you? Brother   What type of home do you live in: 1 story or 2 story? Lives in a two story home          Immunization History  Administered Date(s) Administered   Influenza,inj,Quad PF,6+ Mos 06/14/2019   Influenza-Unspecified 05/05/2021   PFIZER(Purple Top)SARS-COV-2 Vaccination 11/08/2019, 11/30/2019, 08/19/2020   PNEUMOCOCCAL CONJUGATE-20 08/26/2021   Tdap 02/03/2015   Zoster Recombinant(Shingrix) 08/26/2021     Objective: Vital Signs: BP 135/80   Pulse 76   Temp 98 F (36.7 C)   Resp 14   Ht 5' 9 (1.753 m)   Wt 265 lb (120.2 kg)   BMI 39.13 kg/m    Physical Exam Vitals and nursing note reviewed.  Constitutional:      Appearance: She is well-developed.  HENT:     Head: Normocephalic and atraumatic.  Eyes:     Conjunctiva/sclera: Conjunctivae normal.  Cardiovascular:     Rate and Rhythm: Normal rate and regular rhythm.     Heart sounds: Normal heart sounds.  Pulmonary:     Effort: Pulmonary effort is normal.     Breath sounds: Normal breath sounds.  Abdominal:     General: Bowel sounds are normal.     Palpations: Abdomen is soft.  Musculoskeletal:     Cervical back: Normal range of motion.  Lymphadenopathy:     Cervical: No cervical adenopathy.  Skin:    General: Skin is warm and dry.     Capillary Refill: Capillary refill takes less than 2 seconds.  Neurological:     Mental Status: She is alert and oriented to person, place, and time.  Psychiatric:        Behavior: Behavior normal.      Musculoskeletal Exam: C-spine, thoracic spine, lumbar spine have good range of motion. Left trapezius muscle tension and tenderness.  No midline spinal tenderness.  No SI joint tenderness.  Shoulder joints, elbow joints, wrist joints, MCPs, PIPs, DIPs have good range of motion with no synovitis.  Complete fist  formation bilaterally.  Locking of the right thumb noted.  Hip joints have good range of motion with no groin pain.  Knee joints have good range of motion no warmth or effusion.  Ankle joints have good range of motion no tenderness or  joint swelling.  No evidence of Achilles tendinitis or plantar fasciitis.   CDAI Exam: CDAI Score: -- Patient Global: --; Provider Global: -- Swollen: --; Tender: -- Joint Exam 06/25/2024   No joint exam has been documented for this visit   There is currently no information documented on the homunculus. Go to the Rheumatology activity and complete the homunculus joint exam.  Investigation: No additional findings.  Imaging: No results found.  Recent Labs: Lab Results  Component Value Date   WBC 6.5 04/12/2024   HGB 11.9 04/12/2024   PLT 229 04/12/2024   NA 140 04/12/2024   K 4.4 04/12/2024   CL 107 04/12/2024   CO2 23 04/12/2024   GLUCOSE 83 04/12/2024   BUN 20 04/12/2024   CREATININE 1.17 (H) 04/12/2024   BILITOT 0.3 04/12/2024   ALKPHOS 75 01/09/2024   AST 23 04/12/2024   ALT 16 04/12/2024   PROT 7.6 04/12/2024   ALBUMIN 4.0 01/09/2024   CALCIUM  10.1 04/12/2024   GFRAA 72 11/13/2020   QFTBGOLDPLUS NEGATIVE 01/15/2019    Speciality Comments: PLQ Eye Exam: 01/19/2024 WNL @ [facility]  Follow up in 1 year. Hydroxychloroquine  since May 2020  Procedures:  No procedures performed Allergies: Methylprednisolone , Sulfa antibiotics, Sulfonamide derivatives, and Macrobid  [nitrofurantoin ]   Assessment / Plan:     Visit Diagnoses: Rheumatoid arthritis with rheumatoid factor of multiple sites without organ or systems involvement (HCC) - RF 74, CCP >250 ,Sed rate 45, ANA 1: 320 cytoplasmic, positive synovitis: She has no synovitis on examination today.  She has not had any signs or symptoms of a rheumatoid arthritis flare.  She has clinically been doing well taking Arava  20 mg 1 tablet by mouth daily and Plaquenil  200 mg 1 tablet by mouth  twice daily.  She is tolerating combination therapy without any side effects and has not had any gaps in therapy.  She has intermittent arthralgias and joint stiffness but no active inflammation.  Her primary concern remains neuropathy affecting both feet.  She is taking gabapentin  as prescribed. She will remain on Arava  and Plaquenil  as combination therapy.  She was advised to notify us  if she develops signs or symptoms of a flare.  She will follow-up in the office in 5 months or sooner if needed.  High risk medication use - Arava  20mg  by mouth daily and Plaquenil  200 mg 1 tablet by mouth twice daily. -Started in May 2020. PLQ Eye Exam: 01/19/2024 was normal.  CBC and CMP updated on 04/12/24.  Her next lab work will be due in mid November and every 3 months to monitor for toxicity.  Future orders for CBC and CMP were placed today. Lipid panel updated on 12/20/23.   Hgb A1c 5.6% on 04/12/24.   PLQ Eye Exam: 01/19/2024 WNL @ [facility] Follow up in 1 year  Discussed the importance of holding arava  if she develops signs or symptoms of an infection and to resume once the infection has completely cleared.  - Plan: CBC with Differential/Platelet, Comprehensive metabolic panel with GFR  Elevated serum creatinine -Creatinine 1.17 and GFR 51 on 04/12/2024.  Future order for CMP with GFR placed today. Plan: Comprehensive metabolic panel with GFR  Chronic pain of both shoulders: She has good ROM of both shoulder joints.   Primary osteoarthritis of both hands: She has intermittent discomfort and stiffness affecting both hands.  No synovitis noted on examination today.  Discussed the importance of joint protection and muscle strengthening.   Primary osteoarthritis of  both knees: She has good range of motion of both knee joints on examination today.  No warmth or effusion noted. She experiences intermittent discomfort in the left knee.  Paperwork for a temporary handicap placard was provided to use as  needed.    Primary osteoarthritis of both feet: She is not currently experiencing any joint pain in the feet but has been suffering from neuropathy in bilateral feet.  Her symptoms are most severe at night but she also has difficulty walking throughout the day due to the severity of symptoms.  She is taking gabapentin  as prescribed. No synovitis noted on exam.  Paresthesia of both feet: Patient has been diagnosed with neuropathy and has established care with neurology.  She is taking gabapentin  as prescribed.  Her symptoms have progressively been worsening.  She had a recent fall due to numbness affecting the left foot.  She has difficulty walking for exercise due to the severity of symptoms. Patient requested paperwork for handicap placard due to difficulty walking prolonged distances.  Positive ANA (antinuclear antibody) - ENA, C3-C4, anticardiolipin, lupus anticoagulant, beta-2  GP 1-were all negative.  She has no other clinical features of autoimmune disease.  De Quervain's disease (radial styloid tenosynovitis) right: Not currently symptomatic.  No tenderness or synovitis noted.  Other medical conditions are listed as follows:  History of DVT (deep vein thrombosis)  History of pulmonary embolism  Vitamin D  deficiency  Hyperlipidemia with target LDL less than 130  History of atrial fibrillation  Essential hypertension  PVC's (premature ventricular contractions)  History of anemia  History of recent pneumonia  Orders: Orders Placed This Encounter  Procedures   CBC with Differential/Platelet   Comprehensive metabolic panel with GFR   No orders of the defined types were placed in this encounter.    Follow-Up Instructions: No follow-ups on file.   Waddell CHRISTELLA Craze, PA-C  Note - This record has been created using Dragon software.  Chart creation errors have been sought, but may not always  have been located. Such creation errors do not reflect on  the standard of medical  care.

## 2024-06-25 ENCOUNTER — Encounter: Payer: Self-pay | Admitting: Physician Assistant

## 2024-06-25 ENCOUNTER — Ambulatory Visit: Attending: Physician Assistant | Admitting: Physician Assistant

## 2024-06-25 VITALS — BP 135/80 | HR 76 | Temp 98.0°F | Resp 14 | Ht 69.0 in | Wt 265.0 lb

## 2024-06-25 DIAGNOSIS — E785 Hyperlipidemia, unspecified: Secondary | ICD-10-CM

## 2024-06-25 DIAGNOSIS — Z862 Personal history of diseases of the blood and blood-forming organs and certain disorders involving the immune mechanism: Secondary | ICD-10-CM

## 2024-06-25 DIAGNOSIS — M654 Radial styloid tenosynovitis [de Quervain]: Secondary | ICD-10-CM

## 2024-06-25 DIAGNOSIS — Z79899 Other long term (current) drug therapy: Secondary | ICD-10-CM | POA: Diagnosis not present

## 2024-06-25 DIAGNOSIS — Z8701 Personal history of pneumonia (recurrent): Secondary | ICD-10-CM

## 2024-06-25 DIAGNOSIS — R7989 Other specified abnormal findings of blood chemistry: Secondary | ICD-10-CM | POA: Diagnosis not present

## 2024-06-25 DIAGNOSIS — M19071 Primary osteoarthritis, right ankle and foot: Secondary | ICD-10-CM

## 2024-06-25 DIAGNOSIS — R7689 Other specified abnormal immunological findings in serum: Secondary | ICD-10-CM | POA: Diagnosis not present

## 2024-06-25 DIAGNOSIS — R202 Paresthesia of skin: Secondary | ICD-10-CM | POA: Diagnosis not present

## 2024-06-25 DIAGNOSIS — M0579 Rheumatoid arthritis with rheumatoid factor of multiple sites without organ or systems involvement: Secondary | ICD-10-CM | POA: Diagnosis not present

## 2024-06-25 DIAGNOSIS — I10 Essential (primary) hypertension: Secondary | ICD-10-CM

## 2024-06-25 DIAGNOSIS — Z86711 Personal history of pulmonary embolism: Secondary | ICD-10-CM

## 2024-06-25 DIAGNOSIS — M170 Bilateral primary osteoarthritis of knee: Secondary | ICD-10-CM | POA: Diagnosis not present

## 2024-06-25 DIAGNOSIS — M25511 Pain in right shoulder: Secondary | ICD-10-CM | POA: Diagnosis not present

## 2024-06-25 DIAGNOSIS — M19042 Primary osteoarthritis, left hand: Secondary | ICD-10-CM

## 2024-06-25 DIAGNOSIS — G8929 Other chronic pain: Secondary | ICD-10-CM

## 2024-06-25 DIAGNOSIS — M19041 Primary osteoarthritis, right hand: Secondary | ICD-10-CM | POA: Diagnosis not present

## 2024-06-25 DIAGNOSIS — I493 Ventricular premature depolarization: Secondary | ICD-10-CM

## 2024-06-25 DIAGNOSIS — M19072 Primary osteoarthritis, left ankle and foot: Secondary | ICD-10-CM

## 2024-06-25 DIAGNOSIS — Z86718 Personal history of other venous thrombosis and embolism: Secondary | ICD-10-CM

## 2024-06-25 DIAGNOSIS — M25512 Pain in left shoulder: Secondary | ICD-10-CM

## 2024-06-25 DIAGNOSIS — Z8679 Personal history of other diseases of the circulatory system: Secondary | ICD-10-CM

## 2024-06-25 DIAGNOSIS — E559 Vitamin D deficiency, unspecified: Secondary | ICD-10-CM

## 2024-06-26 ENCOUNTER — Ambulatory Visit: Admitting: Physician Assistant

## 2024-06-26 DIAGNOSIS — M170 Bilateral primary osteoarthritis of knee: Secondary | ICD-10-CM

## 2024-06-26 DIAGNOSIS — I493 Ventricular premature depolarization: Secondary | ICD-10-CM

## 2024-06-26 DIAGNOSIS — Z8701 Personal history of pneumonia (recurrent): Secondary | ICD-10-CM

## 2024-06-26 DIAGNOSIS — Z8679 Personal history of other diseases of the circulatory system: Secondary | ICD-10-CM

## 2024-06-26 DIAGNOSIS — M19042 Primary osteoarthritis, left hand: Secondary | ICD-10-CM

## 2024-06-26 DIAGNOSIS — M654 Radial styloid tenosynovitis [de Quervain]: Secondary | ICD-10-CM

## 2024-06-26 DIAGNOSIS — R7689 Other specified abnormal immunological findings in serum: Secondary | ICD-10-CM

## 2024-06-26 DIAGNOSIS — R7989 Other specified abnormal findings of blood chemistry: Secondary | ICD-10-CM

## 2024-06-26 DIAGNOSIS — E785 Hyperlipidemia, unspecified: Secondary | ICD-10-CM

## 2024-06-26 DIAGNOSIS — M0579 Rheumatoid arthritis with rheumatoid factor of multiple sites without organ or systems involvement: Secondary | ICD-10-CM

## 2024-06-26 DIAGNOSIS — G8929 Other chronic pain: Secondary | ICD-10-CM

## 2024-06-26 DIAGNOSIS — Z86711 Personal history of pulmonary embolism: Secondary | ICD-10-CM

## 2024-06-26 DIAGNOSIS — Z862 Personal history of diseases of the blood and blood-forming organs and certain disorders involving the immune mechanism: Secondary | ICD-10-CM

## 2024-06-26 DIAGNOSIS — I10 Essential (primary) hypertension: Secondary | ICD-10-CM

## 2024-06-26 DIAGNOSIS — Z86718 Personal history of other venous thrombosis and embolism: Secondary | ICD-10-CM

## 2024-06-26 DIAGNOSIS — R202 Paresthesia of skin: Secondary | ICD-10-CM

## 2024-06-26 DIAGNOSIS — M19071 Primary osteoarthritis, right ankle and foot: Secondary | ICD-10-CM

## 2024-06-26 DIAGNOSIS — Z79899 Other long term (current) drug therapy: Secondary | ICD-10-CM

## 2024-06-26 DIAGNOSIS — E559 Vitamin D deficiency, unspecified: Secondary | ICD-10-CM

## 2024-06-29 ENCOUNTER — Other Ambulatory Visit: Payer: Self-pay | Admitting: Rheumatology

## 2024-06-29 NOTE — Telephone Encounter (Signed)
 Last Fill: 04/16/2024  Labs: 04/12/2024 Creatinine is mildly elevated and stable. CBC normal, hemoglobin A1c normal.   Next Visit: 11/28/2024  Last Visit: 06/25/2024  DX: Rheumatoid arthritis with rheumatoid factor of multiple sites without organ or systems involvement   Current Dose per office note 06/25/2024: Arava  20mg  by mouth daily   Okay to refill Arava  ?

## 2024-07-02 ENCOUNTER — Telehealth: Payer: Self-pay

## 2024-07-02 NOTE — Telephone Encounter (Signed)
 Pt called c/o what she thinks is an infected hair follicle at her left axilla LN surgical site. She describes that it has a head and is painful.   She reports that she did shave there recently and is prone to infected hair follicles or boils to her groin, but she is concerned since this is at her surgical site.   Reports she previously went to ED for one at her groin and was prescribed doxycycline  but this did not help, as more were popping up while she was on the medication.  Pt was offered 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 She accepted an appt for 07/04/24 at 0840. She was encouraged to avoid shaving, do not squeeze it, avoid deodorant and OK to use warm compress. She is agreeable.

## 2024-07-03 NOTE — Progress Notes (Unsigned)
 [facility] Cancer Follow up:    Joshua Debby CROME, MD [address] [place] [address]   DIAGNOSIS: Cancer Staging  Malignant neoplasm of lower-inner quadrant of left breast in female, estrogen receptor positive (HCC) Staging form: Breast, AJCC 8th Edition - Clinical stage from 11/15/2022: Stage IA (cT1c, cN0(f), cM0, G1, ER+, PR+, HER2-) - Signed by Lanell Donald Stagger, PA-C on 11/15/2022 Stage prefix: Initial diagnosis Method of lymph node assessment: Core biopsy Histologic grading system: 3 grade system - Pathologic: Stage IA (pT1b, pN1(sn), cM0, G2, ER+, PR+, HER2-) - Signed by Odean Potts, MD on 01/04/2023 Method of lymph node assessment: Sentinel lymph node biopsy Histologic grading system: 3 grade system    SUMMARY OF ONCOLOGIC HISTORY: Oncology History  Malignant neoplasm of lower-inner quadrant of left breast in female, estrogen receptor positive (HCC)  11/08/2022 Surgery   Screening mammogram detected left breast masses at 8 o'clock position: 1.1 cm: Grade 1 IDC ER 95%, PR 80%, HER2 negative 0.7 cm mass: Grade 1 IDC ER 100%, PR 90%, HER2 negative, Ki-67 10%, multiple lymph nodes 1 was biopsied and benign discordant   11/15/2022 Initial Diagnosis   Primary malignant neoplasm of lower outer quadrant of female breast, left (HCC)   11/15/2022 Cancer Staging   Staging form: Breast, AJCC 8th Edition - Clinical stage from 11/15/2022: Stage IA (cT1c, cN0(f), cM0, G1, ER+, PR+, HER2-) - Signed by Lanell Donald Stagger, PA-C on 11/15/2022 Stage prefix: Initial diagnosis Method of lymph node assessment: Core biopsy Histologic grading system: 3 grade system    Genetic Testing   [facility] Custom Cancer Panel+RNA was Negative. Report date is 11/29/2022.  The Custom Hereditary Cancers Panel offered by [facility] includes sequencing and/or deletion duplication testing of the following 43 genes: APC, ATM, AXIN2, BAP1, BARD1, BMPR1A, BRCA1, BRCA2, BRIP1, CDH1, CDK4, CDKN2A  (p14ARF and p16INK4a only), CHEK2, CTNNA1, EPCAM (Deletion/duplication testing only), FH, GREM1 (promoter region duplication testing only), HOXB13, KIT, MBD4, MEN1, MLH1, MSH2, MSH3, MSH6, MUTYH, NF1, NHTL1, PALB2, PDGFRA, PMS2, POLD1, POLE, PTEN, RAD51C, RAD51D, SMAD4, SMARCA4. STK11, TP53, TSC1, TSC2, and VHL.   12/21/2022 Surgery   Left lumpectomy: 2 foci IDC 1 with lobular features 1 cm and 0.9 cm grade 1, grade 2 DCIS, LCIS, margins negative, LVI present, ER 95%, PR 80%, HER2 2+ by IHC, FISH negative, Ki-67 10% 1/4 lymph nodes positive, another lymph node with micrometastasis   12/21/2022 Oncotype testing   18/16%   01/04/2023 Cancer Staging   Staging form: Breast, AJCC 8th Edition - Pathologic: Stage IA (pT1b, pN1(sn), cM0, G2, ER+, PR+, HER2-) - Signed by Odean Potts, MD on 01/04/2023 Method of lymph node assessment: Sentinel lymph node biopsy Histologic grading system: 3 grade system   02/17/2023 - 04/06/2023 Radiation Therapy   Plan Name: Breast_L_BH Site: Breast, Left Technique: 3D Mode: Photon Dose Per Fraction: 1.8 Gy Prescribed Dose (Delivered / Prescribed): 18 Gy / 18 Gy Prescribed Fxs (Delivered / Prescribed): 10 / 10   Plan Name: Breast_L_BH:1 Site: Breast, Left Technique: 3D Mode: Photon Dose Per Fraction: 1.8 Gy Prescribed Dose (Delivered / Prescribed): 32.4 Gy / 32.4 Gy Prescribed Fxs (Delivered / Prescribed): 18 / 18   Plan Name: Brst_L_Scv_BH Site: Sclav-LT Technique: 3D Mode: Photon Dose Per Fraction: 1.8 Gy Prescribed Dose (Delivered / Prescribed): 50.4 Gy / 50.4 Gy Prescribed Fxs (Delivered / Prescribed): 28 / 28   Plan Name: Brst_L_Bst_BH Site: Breast, Left Technique: 3D Mode: Photon Dose Per Fraction: 2 Gy Prescribed Dose (Delivered / Prescribed): 10  Gy / 10 Gy Prescribed Fxs (Delivered / Prescribed): 5 / 5     03/2023 -  Anti-estrogen oral therapy   Anastrozole  x 7 years     CURRENT THERAPY: exemestane   INTERVAL HISTORY:  Discussed the use  of AI scribe software for clinical note transcription with the patient, who gave verbal consent to proceed.  History of Present Illness Pamela Davenport is a 68 year old female who presents with a questionable infected hair follicle. She applies warm compresses to the lesions, which have erupted and healed, but new ones have appeared, including on her buttock. She describes the condition as rapid and unprecedented for her. Most recently the one in her axilla has drained some and is improved  She has a history of stage 1A, ER, PR positive invasive ductal carcinoma with lobular features, diagnosed in March 2024. She underwent a left lumpectomy followed by adjuvant radiation and antiestrogen therapy with aromatase inhibitors--currently on Exemestane . She experiences neuropathy in her feet, which a neurologist confirmed, but no specific cause was identified. Her hemoglobin A1c levels are 5.5 and 5.6, indicating she is not diabetic, she also sees rheumatology.     Patient Active Problem List   Diagnosis Date Noted  . Chronic hyperglycemia 01/06/2024  . Deficiency anemia 12/20/2023  . Malignant neoplasm of lower-inner quadrant of left breast in female, estrogen receptor positive (HCC) 11/15/2022  . Anemia due to acquired thiamine  deficiency 05/07/2022  . Gastroesophageal reflux disease with esophagitis without hemorrhage 05/04/2022  . Esophageal dysphagia 05/04/2022  . OAB (overactive bladder) 12/31/2021  . Encounter for general adult medical examination with abnormal findings 08/26/2021  . PAF (paroxysmal atrial fibrillation) (HCC) 08/26/2021  . GERD without esophagitis 10/04/2019  . Vitamin D  deficiency disease 09/25/2019  . Hypercoagulable state 09/24/2019  . Iron deficiency anemia 12/07/2018  . Other dietary vitamin B12 deficiency anemia 12/07/2018  . Visit for screening mammogram 04/18/2017  . Hyperlipidemia with target LDL less than 130 10/19/2016  . Essential hypertension 01/13/2015  .  Chronic pulmonary embolism (HCC) 12/05/2008    is allergic to methylprednisolone , sulfa antibiotics, sulfonamide derivatives, and macrobid  [nitrofurantoin ].  MEDICAL HISTORY: Past Medical History:  Diagnosis Date  . Atypical chest pain    normal myoview  11/11/10  . Blood transfusion without reported diagnosis   . Cancer (HCC)   . DVT (deep venous thrombosis) (HCC)    recurrent; chronically anticoagulated with coumadin   . Dysrhythmia   . Family history of anesthesia complication    Sister had decrease in respirations after surgery  . Heart murmur   . Herniated lumbar intervertebral disc   . Hypertension   . Neuropathy   . Paroxysmal atrial fibrillation (HCC)    SINCE AGE 49   . PE (pulmonary embolism)    chronically anticoagulated with coumadin   . Pneumonia 07/20/2012  . PONV (postoperative nausea and vomiting)   . Rheumatoid arthritis (HCC)   . Sickle cell trait   . Sinus congestion 08/14/2018   C/O NASAL/CHEST  CONGESTION, COUGHING WITH PHLEGM, PER PATIENT , SHE IS TO BEGIN TAKING AUGMENTIN  DOSE PACK TODAY;    . Strep throat at age 70   The patient reports a severe strep throat infection at age 76 and   is not clear as to whether or not she may have had rheumatic fever.  . Symptomatic premature ventricular contractions    improved s/p ablation  . Urgency of urination   . Uterine bleeding 2008   Uterine artery embolization in 2008 for uterine bleeding.  SURGICAL HISTORY: Past Surgical History:  Procedure Laterality Date  . BREAST BIOPSY Left 11/08/2022   US  LT BREAST BX W LOC DEV EA ADD LESION IMG BX SPEC US  GUIDE 11/08/2022 [facility]  . BREAST BIOPSY Left 11/08/2022   US  LT BREAST BX W LOC DEV 1ST LESION IMG BX SPEC US  GUIDE 11/08/2022 [facility]  . BREAST BIOPSY Left 12/17/2022   US  LT RADIOACTIVE SEED LOC 12/17/2022 [facility]  . BREAST BIOPSY Left 12/17/2022   US  LT RADIOACTIVE SEED EA ADD LESION 12/17/2022 [facility]  . BREAST BIOPSY  Left 12/17/2022   US  LT RADIOACTIVE SEED EA ADD LESION 12/17/2022 [facility]  . BREAST LUMPECTOMY WITH RADIOACTIVE SEED AND SENTINEL LYMPH NODE BIOPSY Left 12/21/2022   Procedure: LEFT BREAST BRACKETED SEED LUMPECTOMY AND LEFT SENTINEL LYMPH NODE MAPPING;  Surgeon: Vanderbilt Ned, MD;  Location: [facility];  Service: General;  Laterality: Left;  GEN & PEC BLOCK  . COLONOSCOPY W/ POLYPECTOMY    . Diagnostic D&C hysteroscopy and Novasure ablation  03/17/2004  . DILATION AND CURETTAGE OF UTERUS  12/17/2011   Procedure: DILATATION AND CURETTAGE;  Surgeon: Aida DELENA Na, MD;  Location: [facility];  Service: Gynecology;  Laterality: N/A;  With Attempted Hydrothermal Ablation  . LUMBAR LAMINECTOMY/DECOMPRESSION MICRODISCECTOMY Left 02/21/2013   Procedure: LUMBAR LAMINECTOMY/DECOMPRESSION MICRODISCECTOMY 1 LEVEL;  Surgeon: Rockey LITTIE Peru, MD;  Location: [facility];  Service: Neurosurgery;  Laterality: Left;  LEFT Lumbar Three-Four Laminotomy foraminotomy microdiskectomy  . MANDIBLE SURGERY     under bite  . PVC Ablation  2010  . ROBOTIC ASSISTED TOTAL HYSTERECTOMY WITH BILATERAL SALPINGO OOPHERECTOMY Bilateral 08/21/2018   Procedure: XI ROBOTIC ASSISTED TOTAL HYSTERECTOMY WITH BILATERAL SALPINGO OOPHORECTOMY;  Surgeon: Eloy Herring, MD;  Location: [facility];  Service: Gynecology;  Laterality: Bilateral;  . SPINE SURGERY    . TONSILLECTOMY    . UTERINE ARTERY EMBOLIZATION  2008   for uterine bleeding  . VASCULAR SURGERY     laser of left leg    SOCIAL HISTORY: Social History   Socioeconomic History  . Marital status: Single    Spouse name: Not on file  . Number of children: Not on file  . Years of education: Not on file  . Highest education level: Bachelor's degree (e.g., BA, AB, BS)  Occupational History  . Occupation: Nurse  Tobacco Use  . Smoking status: Former    Current packs/day: 0.00    Average packs/day: 1 pack/day for 2.0 years (2.0 ttl  pk-yrs)    Types: Cigarettes    Start date: 02/12/1977    Quit date: 02/13/1979    Years since quitting: 45.4    Passive exposure: Never  . Smokeless tobacco: Never  Vaping Use  . Vaping status: Never Used  Substance and Sexual Activity  . Alcohol use: Not Currently    Comment: rarely *has stopped due to breast cancer medicine*  . Drug use: No  . Sexual activity: Not Currently  Other Topics Concern  . Not on file  Social History Narrative   Works as a engineer, civil (consulting) at [employer]      Are you right handed or left handed? Right Handed     Are you currently employed ? 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   Do you live at home alone? No    Who lives with you? Brother   What type of home do you live in:  1 story or 2 story? Lives in a two story home        Social Drivers of Health   Financial Resource Strain: Low Risk  (02/02/2024)   Overall Financial Resource Strain (CARDIA)   . Difficulty of Paying Living Expenses: Not hard at all  Food Insecurity: No Food Insecurity (02/02/2024)   Hunger Vital Sign   . Worried About Programme Researcher, Broadcasting/film/video in the Last Year: Never true   . Ran Out of Food in the Last Year: Never true  Transportation Needs: No Transportation Needs (02/02/2024)   PRAPARE - Transportation   . Lack of Transportation (Medical): No   . Lack of Transportation (Non-Medical): No  Physical Activity: Inactive (02/02/2024)   Exercise Vital Sign   . Days of Exercise per Week: 0 days   . Minutes of Exercise per Session: 0 min  Stress: Stress Concern Present (02/02/2024)   [employer] of Occupational Health - Occupational Stress Questionnaire   . Feeling of Stress : To some extent  Social Connections: Socially Isolated (02/02/2024)   Social Connection and Isolation Panel   . Frequency of Communication with Friends and Family: More than three times a week   . Frequency of Social Gatherings with Friends and Family: More than three times  a week   . Attends Religious Services: Never   . Active Member of Clubs or Organizations: No   . Attends Banker Meetings: Never   . Marital Status: Never married  Intimate Partner Violence: Not At Risk (02/02/2024)   Humiliation, Afraid, Rape, and Kick questionnaire   . Fear of Current or Ex-Partner: No   . Emotionally Abused: No   . Physically Abused: No   . Sexually Abused: No    FAMILY HISTORY: Family History  Problem Relation Age of Onset  . Hypertension Mother   . Diabetes Mother   . Uterine cancer Sister 61  . Prostate cancer Brother 27  . Prostate cancer Brother 38  . Prostate cancer Brother   . Prostate cancer Maternal Uncle   . Prostate cancer Maternal Uncle   . Non-Hodgkin's lymphoma Paternal Aunt 53  . Pancreatic cancer Paternal Aunt 51  . Colon cancer Paternal Grandmother 16  . Healthy Son   . Breast cancer Neg Hx     Review of Systems  Constitutional:  Negative for appetite change, chills, fatigue, fever and unexpected weight change.  HENT:   Negative for hearing loss, lump/mass and trouble swallowing.   Eyes:  Negative for eye problems and icterus.  Respiratory:  Negative for chest tightness, cough and shortness of breath.   Cardiovascular:  Negative for chest pain, leg swelling and palpitations.  Gastrointestinal:  Negative for abdominal distention, abdominal pain, constipation, diarrhea, nausea and vomiting.  Endocrine: Negative for hot flashes.  Genitourinary:  Negative for difficulty urinating.   Musculoskeletal:  Negative for arthralgias.  Skin:  Negative for itching and rash.  Neurological:  Negative for dizziness, extremity weakness, headaches and numbness.  Hematological:  Negative for adenopathy. Does not bruise/bleed easily.  Psychiatric/Behavioral:  Negative for depression. The patient is not nervous/anxious.       PHYSICAL EXAMINATION    There were no vitals filed for this visit.  Physical Exam Constitutional:      General:  She is not in acute distress.    Appearance: Normal appearance. She is not toxic-appearing.  HENT:     Head: Normocephalic and atraumatic.     Mouth/Throat:     Mouth: Mucous membranes  are moist.     Pharynx: Oropharynx is clear. No oropharyngeal exudate or posterior oropharyngeal erythema.  Eyes:     General: No scleral icterus. Cardiovascular:     Rate and Rhythm: Normal rate and regular rhythm.     Pulses: Normal pulses.     Heart sounds: Normal heart sounds.  Pulmonary:     Effort: Pulmonary effort is normal.     Breath sounds: Normal breath sounds.  Abdominal:     General: Abdomen is flat. Bowel sounds are normal. There is no distension.     Palpations: Abdomen is soft.     Tenderness: There is no abdominal tenderness.  Musculoskeletal:        General: No swelling.     Cervical back: Neck supple.  Lymphadenopathy:     Cervical: No cervical adenopathy.  Skin:    General: Skin is warm and dry.     Findings: No rash.  Neurological:     General: No focal deficit present.     Mental Status: She is alert.  Psychiatric:        Mood and Affect: Mood normal.        Behavior: Behavior normal.      ASSESSMENT and THERAPY PLAN:   No problem-specific Assessment & Plan notes found for this encounter. Assessment and Plan Assessment & Plan Folliculitis Improvement noted with inflamed hair follicles, including one on the buttock. - Prescribed antibiotic gel for topical application. - Advised warm compresses to affected areas. - Instructed to call if no improvement for potential oral antibiotics.  History of left breast invasive ductal carcinoma, stage 1A, ER/PR positive, status post lumpectomy, adjuvant radiation, and antiestrogen therapy Stage 1A ER/PR positive invasive ductal carcinoma with lobular features, post-treatment with lumpectomy, radiation, and anastrozole . Positive lymph node involvement. Discussed recurrence risk and early detection benefits via circulating tumor  DNA testing. - Ordered Gartent Reveal blood test for circulating tumor DNA every six months.     All questions were answered. The patient knows to call the clinic with any problems, questions or concerns. We can certainly see the patient much sooner if necessary.  Total encounter time:*** minutes*in face-to-face visit time, chart review, lab review, care coordination, order entry, and documentation of the encounter time.    Morna Kendall, NP 07/03/24 9:16 PM Medical Oncology and Hematology [facility] [address] [place], [place] [address] Tel. [phone]    Fax. [phone]  *Total Encounter Time as defined by the Centers for Medicare and Medicaid Services includes, in addition to the face-to-face time of a patient visit (documented in the note above) non-face-to-face time: obtaining and reviewing outside history, ordering and reviewing medications, tests or procedures, care coordination (communications with other health care professionals or caregivers) and documentation in the medical record.

## 2024-07-04 ENCOUNTER — Inpatient Hospital Stay: Attending: Adult Health | Admitting: Adult Health

## 2024-07-04 ENCOUNTER — Encounter: Payer: Self-pay | Admitting: Adult Health

## 2024-07-04 VITALS — BP 145/77 | HR 79 | Temp 97.9°F | Resp 18 | Wt 263.4 lb

## 2024-07-04 DIAGNOSIS — G629 Polyneuropathy, unspecified: Secondary | ICD-10-CM | POA: Diagnosis not present

## 2024-07-04 DIAGNOSIS — Z87891 Personal history of nicotine dependence: Secondary | ICD-10-CM | POA: Insufficient documentation

## 2024-07-04 DIAGNOSIS — C50312 Malignant neoplasm of lower-inner quadrant of left female breast: Secondary | ICD-10-CM | POA: Diagnosis not present

## 2024-07-04 DIAGNOSIS — Z800 Family history of malignant neoplasm of digestive organs: Secondary | ICD-10-CM | POA: Diagnosis not present

## 2024-07-04 DIAGNOSIS — Z807 Family history of other malignant neoplasms of lymphoid, hematopoietic and related tissues: Secondary | ICD-10-CM | POA: Diagnosis not present

## 2024-07-04 DIAGNOSIS — Z1721 Progesterone receptor positive status: Secondary | ICD-10-CM | POA: Insufficient documentation

## 2024-07-04 DIAGNOSIS — L739 Follicular disorder, unspecified: Secondary | ICD-10-CM | POA: Diagnosis not present

## 2024-07-04 DIAGNOSIS — C773 Secondary and unspecified malignant neoplasm of axilla and upper limb lymph nodes: Secondary | ICD-10-CM | POA: Insufficient documentation

## 2024-07-04 DIAGNOSIS — Z1732 Human epidermal growth factor receptor 2 negative status: Secondary | ICD-10-CM | POA: Insufficient documentation

## 2024-07-04 DIAGNOSIS — Z170 Estrogen receptor positive status [ER+]: Secondary | ICD-10-CM | POA: Insufficient documentation

## 2024-07-04 MED ORDER — CLINDAMYCIN PHOS (ONCE-DAILY) 1 % EX GEL: 1.0000 | Freq: Two times a day (BID) | CUTANEOUS | 0 refills | Status: AC

## 2024-07-05 NOTE — Assessment & Plan Note (Signed)
 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    Treatment plan: Oncotype DX score 18 (distant recurrence at 9 years: 16%) Adjuvant radiation therapy 02/18/2023-04/06/2023 Adjuvant antiestrogen therapy with anastrozole  1 mg daily to start 04/05/2023 (unable to tolerate), changed to Letrozole  07/2023 switched to exemestane  January 2025 (due to arthralgias)

## 2024-07-10 ENCOUNTER — Telehealth: Payer: Self-pay

## 2024-07-10 ENCOUNTER — Other Ambulatory Visit: Payer: Self-pay | Admitting: Adult Health

## 2024-07-10 MED ORDER — METRONIDAZOLE 1 % EX GEL: Freq: Every day | CUTANEOUS | 0 refills | Status: AC

## 2024-07-10 NOTE — Telephone Encounter (Signed)
 Called and told her Metrogel Rx sent to pharmacy. She verbalized understanding.

## 2024-07-10 NOTE — Telephone Encounter (Signed)
 Returned her call. The Clindamycin  gel is going to be $700 at the pharmacy. She is asking if she can take oral antibiotics or another alternative. She ask the Rx be sent to [facility] on [address] if possible.

## 2024-07-16 ENCOUNTER — Other Ambulatory Visit: Payer: Self-pay | Admitting: Cardiology

## 2024-07-16 ENCOUNTER — Other Ambulatory Visit: Payer: Self-pay | Admitting: Physician Assistant

## 2024-07-16 DIAGNOSIS — M0579 Rheumatoid arthritis with rheumatoid factor of multiple sites without organ or systems involvement: Secondary | ICD-10-CM

## 2024-07-16 NOTE — Telephone Encounter (Signed)
 Last Fill: 05/07/2024  Eye exam: 01/19/2024    Labs: 04/12/2024 Creatinine is mildly elevated and stable. CBC normal, hemoglobin A1c normal.   Next Visit: 11/28/2024  Last Visit: 06/25/2024  DX: Rheumatoid arthritis with rheumatoid factor of multiple sites without organ or systems involvement   Current Dose per office note on 06/25/2024: Plaquenil  200 mg 1 tablet by mouth twice daily.   Okay to refill Plaquenil ?

## 2024-07-19 DIAGNOSIS — C50312 Malignant neoplasm of lower-inner quadrant of left female breast: Secondary | ICD-10-CM | POA: Diagnosis not present

## 2024-07-20 ENCOUNTER — Other Ambulatory Visit: Payer: Self-pay | Admitting: Internal Medicine

## 2024-07-20 ENCOUNTER — Telehealth: Payer: Self-pay

## 2024-07-20 ENCOUNTER — Other Ambulatory Visit: Payer: Self-pay | Admitting: Adult Health

## 2024-07-20 DIAGNOSIS — E559 Vitamin D deficiency, unspecified: Secondary | ICD-10-CM

## 2024-07-20 MED ORDER — AMOXICILLIN-POT CLAVULANATE 875-125 MG PO TABS: 1.0000 | ORAL_TABLET | Freq: Two times a day (BID) | ORAL | 0 refills | Status: DC

## 2024-07-20 NOTE — Telephone Encounter (Signed)
 Patient called stating she was seen several weeks ago for an infected spot that has reappeared on her surgical side. She reports the abscess has increased in size. Patient stated that Metrogel  was effective previously and is now requesting an oral antibiotic. She noted that doxycycline  did not provide relief in the past. Additionally, she requested a prescription for Diflucan  due to frequent yeast infections.

## 2024-07-20 NOTE — Telephone Encounter (Signed)
 Copied from CRM #[id number]. Topic: Clinical - Medication Refill >> Jul 20, 2024  2:05 PM Lauren C wrote: Medication: Cholecalciferol  (VITAMIN D -3) 25 MCG (1000 UT) CAPS   Has the patient contacted their pharmacy? Yes It is Erin with [facility] calling on her behalf.  This is the patient's preferred pharmacy:  [facility] - [place], [place] - [address] [place] [address] Phone: [phone] Fax: [phone]  Is this the correct pharmacy for this prescription? Yes If no, delete pharmacy and type the correct one.   Has the prescription been filled recently? Yes  Is the patient out of the medication? Unknown  Has the patient been seen for an appointment in the last year OR does the patient have an upcoming appointment? Yes, last seen 02/02/24  Can we respond through MyChart? Yes  Agent: Please be advised that Rx refills may take up to 3 business days. We ask that you follow-up with your pharmacy.

## 2024-07-23 ENCOUNTER — Encounter: Payer: Self-pay | Admitting: Adult Health

## 2024-07-29 DIAGNOSIS — Z86718 Personal history of other venous thrombosis and embolism: Secondary | ICD-10-CM | POA: Diagnosis not present

## 2024-07-29 DIAGNOSIS — I4891 Unspecified atrial fibrillation: Secondary | ICD-10-CM | POA: Diagnosis not present

## 2024-07-29 DIAGNOSIS — D573 Sickle-cell trait: Secondary | ICD-10-CM | POA: Diagnosis not present

## 2024-07-29 DIAGNOSIS — D6869 Other thrombophilia: Secondary | ICD-10-CM | POA: Diagnosis not present

## 2024-07-29 DIAGNOSIS — M1990 Unspecified osteoarthritis, unspecified site: Secondary | ICD-10-CM | POA: Diagnosis not present

## 2024-07-29 DIAGNOSIS — N3941 Urge incontinence: Secondary | ICD-10-CM | POA: Diagnosis not present

## 2024-07-29 DIAGNOSIS — F419 Anxiety disorder, unspecified: Secondary | ICD-10-CM | POA: Diagnosis not present

## 2024-07-29 DIAGNOSIS — K219 Gastro-esophageal reflux disease without esophagitis: Secondary | ICD-10-CM | POA: Diagnosis not present

## 2024-07-29 DIAGNOSIS — Z882 Allergy status to sulfonamides status: Secondary | ICD-10-CM | POA: Diagnosis not present

## 2024-07-29 DIAGNOSIS — I700 Atherosclerosis of aorta: Secondary | ICD-10-CM | POA: Diagnosis not present

## 2024-07-29 DIAGNOSIS — M0550 Rheumatoid polyneuropathy with rheumatoid arthritis of unspecified site: Secondary | ICD-10-CM | POA: Diagnosis not present

## 2024-07-29 DIAGNOSIS — Z87891 Personal history of nicotine dependence: Secondary | ICD-10-CM | POA: Diagnosis not present

## 2024-07-29 DIAGNOSIS — Z809 Family history of malignant neoplasm, unspecified: Secondary | ICD-10-CM | POA: Diagnosis not present

## 2024-07-29 DIAGNOSIS — Z6837 Body mass index (BMI) 37.0-37.9, adult: Secondary | ICD-10-CM | POA: Diagnosis not present

## 2024-07-29 DIAGNOSIS — Z7901 Long term (current) use of anticoagulants: Secondary | ICD-10-CM | POA: Diagnosis not present

## 2024-07-29 DIAGNOSIS — I10 Essential (primary) hypertension: Secondary | ICD-10-CM | POA: Diagnosis not present

## 2024-07-29 DIAGNOSIS — E785 Hyperlipidemia, unspecified: Secondary | ICD-10-CM | POA: Diagnosis not present

## 2024-07-31 ENCOUNTER — Ambulatory Visit: Admitting: Neurology

## 2024-07-31 ENCOUNTER — Encounter: Payer: Self-pay | Admitting: Neurology

## 2024-07-31 VITALS — BP 123/80 | HR 75 | Ht 69.0 in | Wt 268.0 lb

## 2024-07-31 DIAGNOSIS — G629 Polyneuropathy, unspecified: Secondary | ICD-10-CM | POA: Diagnosis not present

## 2024-07-31 MED ORDER — GABAPENTIN 100 MG PO CAPS: ORAL_CAPSULE | ORAL | 3 refills | Status: AC

## 2024-07-31 NOTE — Progress Notes (Signed)
 Follow-up Visit   Date: 07/31/2024    MARISELA LINE MRN: [id number] DOB: 09-05-1955    Pamela Davenport is a 68 y.o. right-handed female with hypertension, PAF (on xeralto), hyperlipidemia, left breast cancer s/p lumpectomy and radiation (2024), GERD, and s/p lumbar surgery returning to the clinic for follow-up of neuropathy.  The patient was accompanied to the clinic by self.  IMPRESSION/PLAN: Assessment & Plan Painful sensory polyneuropathy of the feet.  Risk factors:  RA.   Gabapentin  effective but causes cognitive side effects. Pain manageable with current regimen and ice packs. - Reduced gabapentin  to 100 mg AM and 200 mg PM.    Return to clinic in 6 months  --------------------------------------------- History of present illness: Starting around March 2025, she began having burning, tingling sensation over the top of her feet and balls of the feet.  She has sensation of sand in between her toes.  She occasionally has sharp stabbing pain.  She has completely numbness of the left 2nd toe.  She reports fatigued.  She denies weakness or imbalance.  She is taking gabapentin  300mg  at bedtime which helps night time symptoms.    She was diagnosed with left breast cancer in 10/2022 s/p lumpectomy and radiation.  She did not need chemotherapy. After started anastrazole, she developed severe exacerbation of RA so it was stopped.  She was switched to exemestane  in January 2025.  She sees Dr. Leverette for RA who started her on leflunomide  in late 2024 for rheumatoid arthritis.     She is not diabetic, nor drink alcohol.  Labs show HBA1c 5.5, vitamin B12 267, TSH 2.01, folate 7.5, zinc  71, MMA 283.  She is taking vitamin B12 supplement.   UPDATE 03/14/2024: She is here for follow-up visit.  She continues to have burning and tingling in the feet.  She tried taking gabapentin  100mg  in the morning a few times, which did help, but has not been taking it regularly.  She does get relief with  nighttime pain when she takes gabapentin  300mg  at bedtime which allows her to sleep.  No new symptoms.  NCS/EMG shows sensory neuropathy affecting the feet.   UPDATE 07/31/2024:  Discussed the use of AI scribe software for clinical note transcription with the patient, who gave verbal consent to proceed.  History of Present Illness Pamela Davenport is a 68 year old female who presents for follow-up of her neuropathic pain management.  She experiences a persistent burning sensation in her feet, primarily affecting the toes and the balls of her feet. The pain is more pronounced at night, although it has not worsened recently. She describes the pain as 'tolerable' and notes that it has improved since her initial visit.  She is currently taking gabapentin , 100 mg during the day and 300 mg at bedtime. She wants to decrease the nighttime dose due to side effects such as fatigue and cognitive difficulties. She finds relief from the pain by using jelly ice packs on her feet daily, although she has not needed them in the past few days.  She reports that the pain remains in the toes and balls of the feet.     Medications:  Current Outpatient Medications on File Prior to Visit  Medication Sig Dispense Refill   ALPRAZolam  (XANAX ) 0.5 MG tablet Take 1 tablet (0.5 mg total) by mouth 2 (two) times daily. (Patient taking differently: Take 0.5 mg by mouth as needed.) 60 tablet 2   amLODipine  (NORVASC ) 10 MG tablet TAKE 1  TABLET AT BEDTIME (STOPPING INDAPAMIDE ). 90 tablet 3   Black Pepper-Turmeric (TURMERIC PLUS BLACK PEPPER EXT PO) Take by mouth daily.     Cholecalciferol  (VITAMIN D -3) 25 MCG (1000 UT) CAPS Take 2 capsules (2,000 Units total) by mouth daily. 180 capsule 1   esomeprazole  (NEXIUM ) 40 MG capsule TAKE 1 CAPSULE (40 MG TOTAL) BY MOUTH DAILY. 90 capsule 3   exemestane  (AROMASIN ) 25 MG tablet Take 1 tablet (25 mg total) by mouth daily after breakfast. 90 tablet 3   flecainide  (TAMBOCOR ) 150 MG tablet  Take 2 tablets by mouth at the onset of AFIB (Patient taking differently: as needed. Take 2 tablets by mouth at the onset of AFIB) 30 tablet 3   hydroxychloroquine  (PLAQUENIL ) 200 MG tablet TAKE 1 TABLET TWICE DAILY 180 tablet 0   hydrOXYzine  (VISTARIL ) 50 MG capsule Take 1 capsule (50 mg total) by mouth 3 (three) times daily as needed. 60 capsule 2   irbesartan  (AVAPRO ) 300 MG tablet Take 1 tablet by mouth once daily 90 tablet 1   leflunomide  (ARAVA ) 20 MG tablet TAKE 1 TABLET EVERY DAY 90 tablet 0   metroNIDAZOLE  (METROGEL ) 1 % gel Apply topically daily. 45 g 0   nadolol  (CORGARD ) 20 MG tablet Take 1 tablet (20 mg total) by mouth daily. 90 tablet 2   solifenacin (VESICARE) 10 MG tablet Take 10 mg by mouth daily.     TART CHERRY PO Take 2 tablets by mouth daily.     XARELTO  20 MG TABS tablet TAKE 1 TABLET (20 MG TOTAL) BY MOUTH DAILY WITH SUPPER. 90 tablet 3   amoxicillin -clavulanate (AUGMENTIN ) 875-125 MG tablet Take 1 tablet by mouth 2 (two) times daily. (Patient not taking: Reported on 07/31/2024) 14 tablet 0   Clindamycin  Phos, Once-Daily, (CLINDAGEL ) 1 % GEL Apply 1 Application topically 2 (two) times daily. (Patient not taking: Reported on 07/31/2024) 75 mL 0   No current facility-administered medications on file prior to visit.    Allergies:  Allergies  Allergen Reactions   Methylprednisolone      REACTION: Atrial fibrillation at end of medrol  dose pack   Sulfa Antibiotics Hives   Sulfonamide Derivatives Hives   Macrobid  [Nitrofurantoin ]     Muscle tightening and cramps     Vital Signs:  BP 123/80   Pulse 75   Ht 5' 9 (1.753 m)   Wt 268 lb (121.6 kg)   SpO2 99%   BMI 39.58 kg/m   Neurological Exam: MENTAL STATUS including orientation to time, place, person, recent and remote memory, attention span and concentration, language, and fund of knowledge is normal.  Speech is not dysarthric.  CRANIAL NERVES:   Normal conjugate, extra-ocular eye movements in all directions of  gaze.  No ptosis.  Face is symmetric.   MOTOR:  Motor strength is 5/5 in all extremities.  No atrophy, fasciculations or abnormal movements.  No pronator drift.  Tone is normal.    MSRs:  Reflexes are 2+/4 throughout.  SENSORY:  Reduced at the great toe bilaterally, intact above the ankles bilaterally.   COORDINATION/GAIT:    Gait mildly wide based, stable, unassisted.   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    Thank you for allowing me to participate in patient's care.  If I can answer any additional questions, I would be pleased to do so.    Sincerely,    Kamala Kolton K. Tobie, DO

## 2024-07-31 NOTE — Patient Instructions (Signed)
 Take gabapentin  100mg  in the morning and 200mg  at bedtime.

## 2024-08-02 ENCOUNTER — Encounter: Payer: Self-pay | Admitting: Emergency Medicine

## 2024-08-02 ENCOUNTER — Ambulatory Visit
Admission: EM | Admit: 2024-08-02 | Discharge: 2024-08-02 | Disposition: A | Discharge status: Home / Self Care | Attending: Internal Medicine | Admitting: Internal Medicine

## 2024-08-02 DIAGNOSIS — R21 Rash and other nonspecific skin eruption: Secondary | ICD-10-CM

## 2024-08-02 MED ORDER — TRIAMCINOLONE ACETONIDE 0.1 % EX CREA: 1.0000 | TOPICAL_CREAM | Freq: Two times a day (BID) | CUTANEOUS | 0 refills | Status: AC

## 2024-08-02 NOTE — ED Provider Notes (Signed)
 [facility]    CSN: [id number] Arrival date & time: 08/02/24  1454      History   Chief Complaint Chief Complaint  Patient presents with   Rash    HPI Pamela Davenport is a 68 y.o. female.   Patient presents with itchy rash to lower extremities.  Reports she noticed some itching to her legs approximately 2 days ago and noticed the rash today.  Rash has also appeared on right leg today.  Denies fever.  Denies injury.  Denies numbness or tingling.  She has not used any medications for symptoms.  Reports that rash seemed to start after she started using a new supplement.  Patient is concerned given that she has a history of DVT.  She has been taking her Xarelto  as prescribed and has not missed a dose.   Rash   Past Medical History:  Diagnosis Date   Atypical chest pain    normal myoview  11/11/10   Blood transfusion without reported diagnosis    Cancer (HCC)    DVT (deep venous thrombosis) (HCC)    recurrent; chronically anticoagulated with coumadin    Dysrhythmia    Family history of anesthesia complication    Sister had decrease in respirations after surgery   Heart murmur    Herniated lumbar intervertebral disc    Hypertension    Neuropathy    Paroxysmal atrial fibrillation (HCC)    SINCE AGE 25    PE (pulmonary embolism)    chronically anticoagulated with coumadin    Pneumonia 07/20/2012   PONV (postoperative nausea and vomiting)    Rheumatoid arthritis (HCC)    Sickle cell trait    Sinus congestion 08/14/2018   C/O NASAL/CHEST  CONGESTION, COUGHING WITH PHLEGM, PER PATIENT , SHE IS TO BEGIN TAKING AUGMENTIN  DOSE PACK TODAY;     Strep throat at age 61   The patient reports a severe strep throat infection at age 76 and   is not clear as to whether or not she may have had rheumatic fever.   Symptomatic premature ventricular contractions    improved s/p ablation   Urgency of urination    Uterine bleeding 2008   Uterine artery embolization in  2008 for uterine bleeding.     Patient Active Problem List   Diagnosis Date Noted   Chronic hyperglycemia 01/06/2024   Deficiency anemia 12/20/2023   Malignant neoplasm of lower-inner quadrant of left breast in female, estrogen receptor positive (HCC) 11/15/2022   Anemia due to acquired thiamine  deficiency 05/07/2022   Gastroesophageal reflux disease with esophagitis without hemorrhage 05/04/2022   Esophageal dysphagia 05/04/2022   OAB (overactive bladder) 12/31/2021   Encounter for general adult medical examination with abnormal findings 08/26/2021   PAF (paroxysmal atrial fibrillation) (HCC) 08/26/2021   GERD without esophagitis 10/04/2019   Vitamin D  deficiency disease 09/25/2019   Hypercoagulable state 09/24/2019   Iron deficiency anemia 12/07/2018   Other dietary vitamin B12 deficiency anemia 12/07/2018   Visit for screening mammogram 04/18/2017   Hyperlipidemia with target LDL less than 130 10/19/2016   Essential hypertension 01/13/2015   Chronic pulmonary embolism (HCC) 12/05/2008    Past Surgical History:  Procedure Laterality Date   BREAST BIOPSY Left 11/08/2022   US  LT BREAST BX W LOC DEV EA ADD LESION IMG BX SPEC US  GUIDE 11/08/2022 [facility]   BREAST BIOPSY Left 11/08/2022   US  LT BREAST BX W LOC DEV 1ST LESION IMG BX SPEC US  GUIDE 11/08/2022  [facility]   BREAST BIOPSY Left 12/17/2022   US  LT RADIOACTIVE SEED LOC 12/17/2022 [facility]   BREAST BIOPSY Left 12/17/2022   US  LT RADIOACTIVE SEED EA ADD LESION 12/17/2022 [facility]   BREAST BIOPSY Left 12/17/2022   US  LT RADIOACTIVE SEED EA ADD LESION 12/17/2022 [facility]   BREAST LUMPECTOMY WITH RADIOACTIVE SEED AND SENTINEL LYMPH NODE BIOPSY Left 12/21/2022   Procedure: LEFT BREAST BRACKETED SEED LUMPECTOMY AND LEFT SENTINEL LYMPH NODE MAPPING;  Surgeon: Vanderbilt Ned, MD;  Location: [facility];  Service: General;  Laterality: Left;  GEN & PEC BLOCK   COLONOSCOPY W/  POLYPECTOMY     Diagnostic D&C hysteroscopy and Novasure ablation  03/17/2004   DILATION AND CURETTAGE OF UTERUS  12/17/2011   Procedure: DILATATION AND CURETTAGE;  Surgeon: Aida DELENA Na, MD;  Location: [facility];  Service: Gynecology;  Laterality: N/A;  With Attempted Hydrothermal Ablation   LUMBAR LAMINECTOMY/DECOMPRESSION MICRODISCECTOMY Left 02/21/2013   Procedure: LUMBAR LAMINECTOMY/DECOMPRESSION MICRODISCECTOMY 1 LEVEL;  Surgeon: Rockey LITTIE Peru, MD;  Location: [facility];  Service: Neurosurgery;  Laterality: Left;  LEFT Lumbar Three-Four Laminotomy foraminotomy microdiskectomy   MANDIBLE SURGERY     under bite   PVC Ablation  2010   ROBOTIC ASSISTED TOTAL HYSTERECTOMY WITH BILATERAL SALPINGO OOPHERECTOMY Bilateral 08/21/2018   Procedure: XI ROBOTIC ASSISTED TOTAL HYSTERECTOMY WITH BILATERAL SALPINGO OOPHORECTOMY;  Surgeon: Eloy Herring, MD;  Location: [facility];  Service: Gynecology;  Laterality: Bilateral;   SPINE SURGERY     TONSILLECTOMY     UTERINE ARTERY EMBOLIZATION  2008   for uterine bleeding   VASCULAR SURGERY     laser of left leg    OB History   No obstetric history on file.      Home Medications    Prior to Admission medications   Medication Sig Start Date End Date Taking? Authorizing Provider  ALPRAZolam  (XANAX ) 0.5 MG tablet Take 1 tablet (0.5 mg total) by mouth 2 (two) times daily. Patient taking differently: Take 0.5 mg by mouth as needed. 02/23/24  Yes Rudy Carlin DELENA, MD  amLODipine  (NORVASC ) 10 MG tablet TAKE 1 TABLET AT BEDTIME (STOPPING INDAPAMIDE ). 07/18/24  Yes Cindie Ole DASEN, MD  Black Pepper-Turmeric (TURMERIC PLUS BLACK PEPPER EXT PO) Take by mouth daily.   Yes [provider]  Cholecalciferol  (VITAMIN D -3) 25 MCG (1000 UT) CAPS Take 2 capsules (2,000 Units total) by mouth daily. 12/20/23  Yes Joshua Ned LITTIE, MD  esomeprazole  (NEXIUM ) 40 MG capsule TAKE 1 CAPSULE (40 MG TOTAL) BY MOUTH DAILY. 05/09/24  Yes Joshua Ned LITTIE, MD  exemestane  (AROMASIN ) 25 MG tablet Take 1 tablet (25 mg total) by mouth daily after breakfast. 12/20/23  Yes Odean Potts, MD  gabapentin  (NEURONTIN ) 100 MG capsule Take 1 tablet in the morning and 2 tablet at bedtime. 07/31/24  Yes Patel, Donika K, DO  hydroxychloroquine  (PLAQUENIL ) 200 MG tablet TAKE 1 TABLET TWICE DAILY 07/16/24  Yes Dale, Taylor M, PA-C  hydrOXYzine  (VISTARIL ) 50 MG capsule Take 1 capsule (50 mg total) by mouth 3 (three) times daily as needed. 11/21/23  Yes Rudy Carlin DELENA, MD  irbesartan  (AVAPRO ) 300 MG tablet Take 1 tablet by mouth once daily 01/20/24  Yes Cindie Ole DASEN, MD  leflunomide  (ARAVA ) 20 MG tablet TAKE 1 TABLET EVERY DAY 06/29/24  Yes Deveshwar, Maya, MD  metroNIDAZOLE  (METROGEL ) 1 % gel Apply topically daily. 07/10/24  Yes Causey, Morna Pickle, NP  nadolol  (CORGARD ) 20 MG tablet Take  1 tablet (20 mg total) by mouth daily. 12/08/23  Yes Lesia Ozell Barter, PA-C  solifenacin (VESICARE) 10 MG tablet Take 10 mg by mouth daily. 06/10/24  Yes [provider]  TART CHERRY PO Take 2 tablets by mouth daily.   Yes [provider]  triamcinolone  cream (KENALOG ) 0.1 % Apply 1 Application topically 2 (two) times daily. 08/02/24  Yes Clarabelle Oscarson, Darryle E, FNP  XARELTO  20 MG TABS tablet TAKE 1 TABLET (20 MG TOTAL) BY MOUTH DAILY WITH SUPPER. 05/07/24  Yes Cindie Ole DASEN, MD  amoxicillin -clavulanate (AUGMENTIN ) 875-125 MG tablet Take 1 tablet by mouth 2 (two) times daily. Patient not taking: Reported on 07/31/2024 07/20/24   Crawford Morna Pickle, NP  Clindamycin  Phos, Once-Daily, (CLINDAGEL ) 1 % GEL Apply 1 Application topically 2 (two) times daily. Patient not taking: Reported on 07/31/2024 07/04/24   Crawford Morna Pickle, NP  flecainide  (TAMBOCOR ) 150 MG tablet Take 2 tablets by mouth at the onset of AFIB Patient taking differently: as needed. Take 2 tablets by mouth at the onset of AFIB 06/22/21   Lesia Ozell Barter, PA-C    Family  History Family History  Problem Relation Age of Onset   Hypertension Mother    Diabetes Mother    Uterine cancer Sister 28   Prostate cancer Brother 41   Prostate cancer Brother 74   Prostate cancer Brother    Prostate cancer Maternal Uncle    Prostate cancer Maternal Uncle    Non-Hodgkin's lymphoma Paternal Aunt 45   Pancreatic cancer Paternal Aunt 10   Colon cancer Paternal Grandmother 70   Healthy Son    Breast cancer Neg Hx     Social History Social History   Tobacco Use   Smoking status: Former    Current packs/day: 0.00    Average packs/day: 1 pack/day for 2.0 years (2.0 ttl pk-yrs)    Types: Cigarettes    Start date: 02/12/1977    Quit date: 02/13/1979    Years since quitting: 45.4    Passive exposure: Never   Smokeless tobacco: Never  Vaping Use   Vaping status: Never Used  Substance Use Topics   Alcohol use: Not Currently    Comment: rarely *has stopped due to breast cancer medicine*   Drug use: No     Allergies   Methylprednisolone , Sulfa antibiotics, Sulfonamide derivatives, and Macrobid  [nitrofurantoin ]   Review of Systems Review of Systems Per HPI  Physical Exam Triage Vital Signs ED Triage Vitals  Encounter Vitals Group     BP 08/02/24 1524 (!) 152/74     Girls Systolic BP Percentile --      Girls Diastolic BP Percentile --      Boys Systolic BP Percentile --      Boys Diastolic BP Percentile --      Pulse Rate 08/02/24 1524 80     Resp 08/02/24 1524 18     Temp 08/02/24 1524 98.5 F (36.9 C)     Temp Source 08/02/24 1524 Oral     SpO2 08/02/24 1524 97 %     Weight 08/02/24 1521 268 lb (121.6 kg)     Height 08/02/24 1521 5' 9 (1.753 m)     Head Circumference --      Peak Flow --      Pain Score 08/02/24 1521 0     Pain Loc --      Pain Education --      Exclude from Growth Chart --    No data  found.  Updated Vital Signs BP (!) 152/74 (BP Location: Right Arm)   Pulse 80   Temp 98.5 F (36.9 C) (Oral)   Resp 18   Ht 5' 9  (1.753 m)   Wt 268 lb (121.6 kg)   SpO2 97%   BMI 39.58 kg/m   Visual Acuity Right Eye Distance:   Left Eye Distance:   Bilateral Distance:    Right Eye Near:   Left Eye Near:    Bilateral Near:     Physical Exam Constitutional:      Appearance: Normal appearance.  HENT:     Head: Normocephalic and atraumatic.  Eyes:     Extraocular Movements: Extraocular movements intact.     Conjunctiva/sclera: Conjunctivae normal.  Pulmonary:     Effort: Pulmonary effort is normal.  Skin:    Comments: See attached photos  Neurological:     General: No focal deficit present.     Mental Status: She is alert and oriented to person, place, and time. Mental status is at baseline.  Psychiatric:        Mood and Affect: Mood normal.        Behavior: Behavior normal.        Thought Content: Thought content normal.        Judgment: Judgment normal.         UC Treatments / Results  Labs (all labs ordered are listed, but only abnormal results are displayed) Labs Reviewed  CBC WITH DIFFERENTIAL/PLATELET    EKG   Radiology No results found.  Procedures Procedures (including critical care time)  Medications Ordered in UC Medications - No data to display  Initial Impression / Assessment and Plan / UC Course  I have reviewed the triage vital signs and the nursing notes.  Pertinent labs & imaging results that were available during my care of the patient were reviewed by me and considered in my medical decision making (see chart for details).     Differential diagnoses for rash include allergic reaction versus cellulitis versus hematoma.  I am most suspicious of allergic reaction so will treat with topical triamcinolone  cream.  Patient has taken oral steroids in the past which caused atrial fibrillation but she is able to take topical steroids.  Rash did not appear to be bacterial in nature so will defer antibiotics at this time.  I have a low concern for DVT given patient has been  compliant with her Xarelto  and given rash is bilateral so will defer ultrasound imaging at this time.  Rash is also not consistent with hematoma given it is only itchy and not painful, and patient has not had any injury.  Will obtain a CBC.  Awaiting results.  Patient advised to stop taking supplement given this could be trigger.  Advised strict follow-up and ER precautions.  Patient verbalized understanding and was agreeable with plan. Final Clinical Impressions(s) / UC Diagnoses   Final diagnoses:  Rash     Discharge Instructions      Blood work is pending.  Will call if it is abnormal.  I have prescribed you topical cream for itching and inflammation.  Please follow-up if symptoms persist or worsen.     ED Prescriptions     Medication Sig Dispense Auth. Provider   triamcinolone  cream (KENALOG ) 0.1 % Apply 1 Application topically 2 (two) times daily. 30 g Hazen Darryle BRAVO, [place]      PDMP not reviewed this encounter.   Hazen Darryle BRAVO, [place] 08/02/24 [phone]

## 2024-08-02 NOTE — Discharge Instructions (Signed)
 Blood work is pending.  Will call if it is abnormal.  I have prescribed you topical cream for itching and inflammation.  Please follow-up if symptoms persist or worsen.

## 2024-08-02 NOTE — ED Triage Notes (Signed)
 Patient c/o rash on the calf of her left lower leg x 2 days.  The area does itch and now starting to show signs on right lower leg.  The areas do not hurt.  Patient concerned that she has a history of DVT.  Patient has not applied any cream to the area.

## 2024-08-03 ENCOUNTER — Ambulatory Visit: Payer: Self-pay | Admitting: Internal Medicine

## 2024-08-03 LAB — CBC WITH DIFFERENTIAL/PLATELET
Basophils Absolute: 0 x10E3/uL (ref 0.0–0.2)
Basos: 0 %
EOS (ABSOLUTE): 0.2 x10E3/uL (ref 0.0–0.4)
Eos: 4 %
Hematocrit: 36.4 % (ref 34.0–46.6)
Hemoglobin: 11.7 g/dL (ref 11.1–15.9)
Immature Grans (Abs): 0 x10E3/uL (ref 0.0–0.1)
Immature Granulocytes: 0 %
Lymphocytes Absolute: 0.9 x10E3/uL (ref 0.7–3.1)
Lymphs: 15 %
MCH: 26.5 pg — ABNORMAL LOW (ref 26.6–33.0)
MCHC: 32.1 g/dL (ref 31.5–35.7)
MCV: 83 fL (ref 79–97)
Monocytes Absolute: 0.4 x10E3/uL (ref 0.1–0.9)
Monocytes: 7 %
Neutrophils Absolute: 4.1 x10E3/uL (ref 1.4–7.0)
Neutrophils: 73 %
Platelets: 244 x10E3/uL (ref 150–450)
RBC: 4.41 x10E6/uL (ref 3.77–5.28)
RDW: 12.8 % (ref 11.7–15.4)
WBC: 5.6 x10E3/uL (ref 3.4–10.8)

## 2024-08-05 ENCOUNTER — Other Ambulatory Visit: Payer: Self-pay | Admitting: Cardiology

## 2024-08-06 ENCOUNTER — Ambulatory Visit: Attending: Surgery

## 2024-08-06 VITALS — Wt 267.0 lb

## 2024-08-06 DIAGNOSIS — Z483 Aftercare following surgery for neoplasm: Secondary | ICD-10-CM | POA: Insufficient documentation

## 2024-08-06 NOTE — Therapy (Signed)
 OUTPATIENT PHYSICAL THERAPY SOZO SCREENING NOTE   Patient Name: Pamela Davenport MRN: [id number] DOB:[birth year], 68 y.o., female Today's Date: 08/06/2024  PCP: Joshua Debby CROME, MD REFERRING PROVIDER: Vanderbilt Debby, MD   PT End of Session - 08/06/24 1531     Visit Number 2   # unchanged due to screen only   PT Start Time 1529    PT Stop Time 1533    PT Time Calculation (min) 4 min    Activity Tolerance Patient tolerated treatment well    Behavior During Therapy [facility] for tasks assessed/performed          Past Medical History:  Diagnosis Date   Atypical chest pain    normal myoview  11/11/10   Blood transfusion without reported diagnosis    Cancer (HCC)    DVT (deep venous thrombosis) (HCC)    recurrent; chronically anticoagulated with coumadin    Dysrhythmia    Family history of anesthesia complication    Sister had decrease in respirations after surgery   Heart murmur    Herniated lumbar intervertebral disc    Hypertension    Neuropathy    Paroxysmal atrial fibrillation (HCC)    SINCE AGE 68    PE (pulmonary embolism)    chronically anticoagulated with coumadin    Pneumonia 07/20/2012   PONV (postoperative nausea and vomiting)    Rheumatoid arthritis (HCC)    Sickle cell trait    Sinus congestion 08/14/2018   C/O NASAL/CHEST  CONGESTION, COUGHING WITH PHLEGM, PER PATIENT , SHE IS TO BEGIN TAKING AUGMENTIN  DOSE PACK TODAY;     Strep throat at age 21   The patient reports a severe strep throat infection at age 11 and   is not clear as to whether or not she may have had rheumatic fever.   Symptomatic premature ventricular contractions    improved s/p ablation   Urgency of urination    Uterine bleeding 2008   Uterine artery embolization in 2008 for uterine bleeding.    Past Surgical History:  Procedure Laterality Date   BREAST BIOPSY Left 11/08/2022   US  LT BREAST BX W LOC DEV EA ADD LESION IMG BX SPEC US  GUIDE 11/08/2022 [facility]   BREAST BIOPSY Left  11/08/2022   US  LT BREAST BX W LOC DEV 1ST LESION IMG BX SPEC US  GUIDE 11/08/2022 [facility]   BREAST BIOPSY Left 12/17/2022   US  LT RADIOACTIVE SEED LOC 12/17/2022 [facility]   BREAST BIOPSY Left 12/17/2022   US  LT RADIOACTIVE SEED EA ADD LESION 12/17/2022 [facility]   BREAST BIOPSY Left 12/17/2022   US  LT RADIOACTIVE SEED EA ADD LESION 12/17/2022 [facility]   BREAST LUMPECTOMY WITH RADIOACTIVE SEED AND SENTINEL LYMPH NODE BIOPSY Left 12/21/2022   Procedure: LEFT BREAST BRACKETED SEED LUMPECTOMY AND LEFT SENTINEL LYMPH NODE MAPPING;  Surgeon: Vanderbilt Debby, MD;  Location: [facility];  Service: General;  Laterality: Left;  GEN & PEC BLOCK   COLONOSCOPY W/ POLYPECTOMY     Diagnostic D&C hysteroscopy and Novasure ablation  03/17/2004   DILATION AND CURETTAGE OF UTERUS  12/17/2011   Procedure: DILATATION AND CURETTAGE;  Surgeon: Aida DELENA Na, MD;  Location: [facility];  Service: Gynecology;  Laterality: N/A;  With Attempted Hydrothermal Ablation   LUMBAR LAMINECTOMY/DECOMPRESSION MICRODISCECTOMY Left 02/21/2013   Procedure: LUMBAR LAMINECTOMY/DECOMPRESSION MICRODISCECTOMY 1 LEVEL;  Surgeon: Rockey CROME Peru, MD;  Location: [facility];  Service: Neurosurgery;  Laterality: Left;  LEFT Lumbar Three-Four Laminotomy foraminotomy microdiskectomy   MANDIBLE SURGERY  under bite   PVC Ablation  2010   ROBOTIC ASSISTED TOTAL HYSTERECTOMY WITH BILATERAL SALPINGO OOPHERECTOMY Bilateral 08/21/2018   Procedure: XI ROBOTIC ASSISTED TOTAL HYSTERECTOMY WITH BILATERAL SALPINGO OOPHORECTOMY;  Surgeon: Eloy Herring, MD;  Location: [facility];  Service: Gynecology;  Laterality: Bilateral;   SPINE SURGERY     TONSILLECTOMY     UTERINE ARTERY EMBOLIZATION  2008   for uterine bleeding   VASCULAR SURGERY     laser of left leg   Patient Active Problem List   Diagnosis Date Noted   Chronic hyperglycemia 01/06/2024   Deficiency anemia 12/20/2023   Malignant  neoplasm of lower-inner quadrant of left breast in female, estrogen receptor positive (HCC) 11/15/2022   Anemia due to acquired thiamine  deficiency 05/07/2022   Gastroesophageal reflux disease with esophagitis without hemorrhage 05/04/2022   Esophageal dysphagia 05/04/2022   OAB (overactive bladder) 12/31/2021   Encounter for general adult medical examination with abnormal findings 08/26/2021   PAF (paroxysmal atrial fibrillation) (HCC) 08/26/2021   GERD without esophagitis 10/04/2019   Vitamin D  deficiency disease 09/25/2019   Hypercoagulable state 09/24/2019   Iron deficiency anemia 12/07/2018   Other dietary vitamin B12 deficiency anemia 12/07/2018   Visit for screening mammogram 04/18/2017   Hyperlipidemia with target LDL less than 130 10/19/2016   Essential hypertension 01/13/2015   Chronic pulmonary embolism (HCC) 12/05/2008    REFERRING DIAG: left breast cancer at risk for lymphedema  THERAPY DIAG: Aftercare following surgery for neoplasm  PERTINENT HISTORY:  Patient was diagnosed on 11/09/2022 with left grade 1 invasive ductal carcinoma breast cancer. She underwent a left lumpectomy and sentinel node biopsy on 12/21/2022. It is ER/PR positive and HER2 negative with a Ki67 of 10%.  PRECAUTIONS: left UE Lymphedema risk, None  SUBJECTIVE: Pt returns for her 3 month L-Dex screen.   PAIN:  Are you having pain? No  SOZO SCREENING: Patient was assessed today using the SOZO machine to determine the lymphedema index score. This was compared to her baseline score. It was determined that she is within the recommended range when compared to her baseline and no further action is needed at this time. She will continue SOZO screenings. These are done every 3 months for 2 years post operatively followed by every 6 months for 2 years, and then annually.    L-DEX FLOWSHEETS - 08/06/24 1500       L-DEX LYMPHEDEMA SCREENING   L-DEX MEASUREMENT EXTREMITY Upper Extremity    POSITION  Standing     DOMINANT SIDE Right    At Risk Side Left    BASELINE SCORE (UNILATERAL) 1.3    L-DEX SCORE (UNILATERAL) 2.7    VALUE CHANGE (UNILAT) 1.4         P: Cont every 3 month SOZO screens until April 2026, then transition to every 6 months x 2 more years.   Aden Berwyn Caldron, PTA 08/06/2024, 3:32 PM

## 2024-08-15 ENCOUNTER — Other Ambulatory Visit: Payer: Self-pay | Admitting: Student

## 2024-08-15 DIAGNOSIS — I480 Paroxysmal atrial fibrillation: Secondary | ICD-10-CM

## 2024-09-03 ENCOUNTER — Ambulatory Visit: Payer: Self-pay

## 2024-09-03 NOTE — Telephone Encounter (Signed)
 FYI Only or Action Required?: Action required by provider: request for appointment.  Patient was last seen in primary care on 01/17/2024 by Geofm Glade PARAS, MD.  Called Nurse Triage reporting Blood Sugar Problem.  Symptoms began several months ago.  Interventions attempted: Nothing.  Symptoms are: gradually worsening.  Triage Disposition: See Physician Within 24 Hours  Patient/caregiver understands and will follow disposition?: No, wishes to speak with PCP  Copied from CRM #[id number]. Topic: Clinical - Red Word Triage >> Sep 03, 2024  9:09 AM Mesmerise C wrote: [facility] that prompted transfer to Nurse Triage: Patient is having neuropathy in her feet and her sugars 115 fasting this morning states it averages anywhere between 120s 140s and 150s with fastings 90s states yesterdaay the lowest she had was 87 at 6pm, states the numbness in her feet aren't getting any better, change in vision, increase hunger, folliculitis Reason for Disposition  New-onset diabetes suspected (e.g., increased thirst, frequent urination, weight loss)  Answer Assessment - Initial Assessment Questions 1. BLOOD GLUCOSE: What is your blood glucose level?      Has been seeing increase in blood sugars 2. ONSET: When did you check the blood glucose?     115 3. USUAL RANGE: What is your glucose level usually? (e.g., usual fasting morning value, usual evening value)     States she has noted that she has been in the 120s, states at times she has had number in the 140s/150s 8. OTHER SYMPTOMS: Do you have any symptoms? (e.g., fever, frequent urination, difficulty breathing, dizziness, weakness, vomiting)     Increased neuropathy, states gabapentin  has been making her hungry, states that she has been having folliculitis.  Pt states that her A1C's have been WNL, and is concerned that they may not actually be within range. Pt has not been dx with DM. Attempted to schedule pt,  no pcp appts in next 2 weeks. Routing to  clinic for scheduling, pt would like to see PCP.  Protocols used: Diabetes - High Blood Sugar-A-AH

## 2024-09-05 NOTE — Telephone Encounter (Signed)
 Patient has been scheduled

## 2024-09-10 ENCOUNTER — Other Ambulatory Visit: Payer: Self-pay | Admitting: Rheumatology

## 2024-09-10 NOTE — Telephone Encounter (Signed)
 Last Fill: 06/29/2024  Labs: 08/02/2024 MCH 26.5, 04/12/2024 Creatinine is mildly elevated and stable.   Next Visit: 11/28/2024  Last Visit: 06/25/2024  DX: Rheumatoid arthritis with rheumatoid factor of multiple sites without organ or systems involvement   Current Dose per office note 06/25/2024: Arava  20mg  by mouth daily   Patient advised she is due to update her labs. Patient will try to update them on Wednesday.   Okay to refill Arava  ?

## 2024-09-12 ENCOUNTER — Other Ambulatory Visit: Payer: Self-pay | Admitting: *Deleted

## 2024-09-12 DIAGNOSIS — R7989 Other specified abnormal findings of blood chemistry: Secondary | ICD-10-CM

## 2024-09-12 DIAGNOSIS — Z79899 Other long term (current) drug therapy: Secondary | ICD-10-CM

## 2024-09-12 DIAGNOSIS — Z131 Encounter for screening for diabetes mellitus: Secondary | ICD-10-CM

## 2024-09-13 ENCOUNTER — Ambulatory Visit: Payer: Self-pay | Admitting: Physician Assistant

## 2024-09-13 LAB — CBC WITH DIFFERENTIAL/PLATELET
Absolute Lymphocytes: 1204 {cells}/uL (ref 850–3900)
Absolute Monocytes: 531 {cells}/uL (ref 200–950)
Basophils Absolute: 41 {cells}/uL (ref 0–200)
Basophils Relative: 0.7 %
Eosinophils Absolute: 159 {cells}/uL (ref 15–500)
Eosinophils Relative: 2.7 %
HCT: 37.3 % (ref 35.9–46.0)
Hemoglobin: 11.9 g/dL (ref 11.7–15.5)
MCH: 26.3 pg — ABNORMAL LOW (ref 27.0–33.0)
MCHC: 31.9 g/dL (ref 31.6–35.4)
MCV: 82.3 fL (ref 81.4–101.7)
MPV: 10.7 fL (ref 7.5–12.5)
Monocytes Relative: 9 %
Neutro Abs: 3965 {cells}/uL (ref 1500–7800)
Neutrophils Relative %: 67.2 %
Platelets: 240 Thousand/uL (ref 140–400)
RBC: 4.53 Million/uL (ref 3.80–5.10)
RDW: 13.3 % (ref 11.0–15.0)
Total Lymphocyte: 20.4 %
WBC: 5.9 Thousand/uL (ref 3.8–10.8)

## 2024-09-13 LAB — COMPREHENSIVE METABOLIC PANEL WITH GFR
AG Ratio: 1.5 (calc) (ref 1.0–2.5)
ALT: 13 U/L (ref 6–29)
AST: 20 U/L (ref 10–35)
Albumin: 4.4 g/dL (ref 3.6–5.1)
Alkaline phosphatase (APISO): 80 U/L (ref 37–153)
BUN/Creatinine Ratio: 22 (calc) (ref 6–22)
BUN: 27 mg/dL — ABNORMAL HIGH (ref 7–25)
CO2: 22 mmol/L (ref 20–32)
Calcium: 9.7 mg/dL (ref 8.6–10.4)
Chloride: 105 mmol/L (ref 98–110)
Creat: 1.23 mg/dL — ABNORMAL HIGH (ref 0.50–1.05)
Globulin: 3 g/dL (ref 1.9–3.7)
Glucose, Bld: 96 mg/dL (ref 65–99)
Potassium: 4.5 mmol/L (ref 3.5–5.3)
Sodium: 140 mmol/L (ref 135–146)
Total Bilirubin: 0.3 mg/dL (ref 0.2–1.2)
Total Protein: 7.4 g/dL (ref 6.1–8.1)
eGFR: 48 mL/min/1.73m2 — ABNORMAL LOW

## 2024-09-13 LAB — HEMOGLOBIN A1C
Hgb A1c MFr Bld: 5.5 %
Mean Plasma Glucose: 111 mg/dL
eAG (mmol/L): 6.2 mmol/L

## 2024-09-13 NOTE — Progress Notes (Signed)
 CBC stable.   Hgb A1c 5.5%   Creatinine is elevated-1.23 and GFR is low-48--avoid the use of NSAIDs.  Please clarify if she has had any medication changes? Any NSAID use? Dehydration?

## 2024-09-18 ENCOUNTER — Ambulatory Visit: Admitting: Internal Medicine

## 2024-09-21 ENCOUNTER — Encounter: Payer: Self-pay | Admitting: Internal Medicine

## 2024-09-21 ENCOUNTER — Ambulatory Visit: Admitting: Internal Medicine

## 2024-09-21 ENCOUNTER — Ambulatory Visit (INDEPENDENT_AMBULATORY_CARE_PROVIDER_SITE_OTHER)

## 2024-09-21 VITALS — BP 136/80 | HR 70 | Temp 98.5°F | Resp 16 | Ht 69.0 in | Wt 272.4 lb

## 2024-09-21 DIAGNOSIS — M25551 Pain in right hip: Secondary | ICD-10-CM | POA: Diagnosis not present

## 2024-09-21 DIAGNOSIS — Z23 Encounter for immunization: Secondary | ICD-10-CM

## 2024-09-21 DIAGNOSIS — I10 Essential (primary) hypertension: Secondary | ICD-10-CM

## 2024-09-21 DIAGNOSIS — E538 Deficiency of other specified B group vitamins: Secondary | ICD-10-CM

## 2024-09-21 DIAGNOSIS — W19XXXA Unspecified fall, initial encounter: Secondary | ICD-10-CM | POA: Diagnosis not present

## 2024-09-21 DIAGNOSIS — M79661 Pain in right lower leg: Secondary | ICD-10-CM

## 2024-09-21 DIAGNOSIS — M79671 Pain in right foot: Secondary | ICD-10-CM

## 2024-09-21 MED ORDER — CYANOCOBALAMIN 1000 MCG/ML IJ SOLN
1000.0000 ug | Freq: Once | INTRAMUSCULAR | Status: AC
  Administered 2024-09-21: 1000 ug via INTRAMUSCULAR

## 2024-09-21 NOTE — Progress Notes (Signed)
 "  Subjective:  Patient ID: Pamela Davenport, female    DOB: [birth year]  Age: 69 y.o. MRN: [id number]  CC: Hyperglycemia (Elevated blood sugar 151 highest one that she has. ) and Fall (Patient states that she fell down her steps this morning, Her right leg and foot is hurting. She said her right foot bent all the way back when she fell. )   HPI Pamela Davenport presents for f/up ----  Discussed the use of AI scribe software for clinical note transcription with the patient, who gave verbal consent to proceed.  History of Present Illness Pamela Davenport is a 69 year old female with neuropathy who presents with tingling in her feet and a recent fall.  She experiences tingling in her feet, initially thought to be caused by her shoes, but it has worsened over time. She was prescribed gabapentin , initially at an unspecified dose, which was later adjusted to 100 mg during the day and 200 mg at night. Despite this adjustment, her symptoms have not improved. A nerve conduction study confirmed neuropathy. She monitors her hemoglobin A1c levels, although she has never been diagnosed with diabetes.  She experienced a fall today while descending stairs, which she attributes to fatigue and weakness in her legs, a condition persisting since her radiation treatment in 2024. During the fall, her right foot bent backward, causing pain primarily in the shin and foot, especially when weight is applied.  She has a history of breast cancer treated with radiation in 2024 and has recently developed follicular abscesses, including one under her arm, which she finds concerning due to her cancer history. She has been experiencing increased hunger, which she initially attributed to gabapentin , and has been monitoring her blood sugar levels, which have varied but not shown significant hypoglycemia or hyperglycemia.  No dizziness, lightheadedness, excessive thirst, or excessive urination. She reports fatigue and weakness in her  legs since radiation treatment. She is currently taking gabapentin  and Tylenol  for pain management.     Outpatient Medications Prior to Visit  Medication Sig Dispense Refill   ALPRAZolam  (XANAX ) 0.5 MG tablet Take 1 tablet (0.5 mg total) by mouth 2 (two) times daily. (Patient taking differently: Take 0.5 mg by mouth as needed.) 60 tablet 2   amLODipine  (NORVASC ) 10 MG tablet TAKE 1 TABLET AT BEDTIME (STOPPING INDAPAMIDE ). 90 tablet 3   Black Pepper-Turmeric (TURMERIC PLUS BLACK PEPPER EXT PO) Take by mouth daily.     Cholecalciferol  (VITAMIN D -3) 25 MCG (1000 UT) CAPS Take 2 capsules (2,000 Units total) by mouth daily. 180 capsule 1   esomeprazole  (NEXIUM ) 40 MG capsule TAKE 1 CAPSULE (40 MG TOTAL) BY MOUTH DAILY. 90 capsule 3   exemestane  (AROMASIN ) 25 MG tablet Take 1 tablet (25 mg total) by mouth daily after breakfast. 90 tablet 3   flecainide  (TAMBOCOR ) 150 MG tablet Take 2 tablets by mouth at the onset of AFIB (Patient taking differently: as needed. Take 2 tablets by mouth at the onset of AFIB) 30 tablet 3   gabapentin  (NEURONTIN ) 100 MG capsule Take 1 tablet in the morning and 2 tablet at bedtime. 270 capsule 3   hydroxychloroquine  (PLAQUENIL ) 200 MG tablet TAKE 1 TABLET TWICE DAILY 180 tablet 0   hydrOXYzine  (VISTARIL ) 50 MG capsule Take 1 capsule (50 mg total) by mouth 3 (three) times daily as needed. 60 capsule 2   irbesartan  (AVAPRO ) 300 MG tablet TAKE 1 TABLET EVERY DAY 90 tablet 0   leflunomide  (ARAVA ) 20 MG tablet TAKE  1 TABLET EVERY DAY 30 tablet 0   metroNIDAZOLE  (METROGEL ) 1 % gel Apply topically daily. 45 g 0   nadolol  (CORGARD ) 20 MG tablet Take 1 tablet (20 mg total) by mouth daily. 90 tablet 2   solifenacin (VESICARE) 10 MG tablet Take 10 mg by mouth daily.     TART CHERRY PO Take 2 tablets by mouth daily.     triamcinolone  cream (KENALOG ) 0.1 % Apply 1 Application topically 2 (two) times daily. 30 g 0   XARELTO  20 MG TABS tablet TAKE 1 TABLET (20 MG TOTAL) BY MOUTH DAILY  WITH SUPPER. 90 tablet 3   Clindamycin  Phos, Once-Daily, (CLINDAGEL ) 1 % GEL Apply 1 Application topically 2 (two) times daily. (Patient not taking: Reported on 09/21/2024) 75 mL 0   amoxicillin -clavulanate (AUGMENTIN ) 875-125 MG tablet Take 1 tablet by mouth 2 (two) times daily. (Patient not taking: Reported on 09/21/2024) 14 tablet 0   No facility-administered medications prior to visit.    ROS Review of Systems  Objective:  BP 136/80 (BP Location: Right Arm, Patient Position: Sitting, Cuff Size: Large)   Pulse 70   Temp 98.5 F (36.9 C) (Oral)   Resp 16   Ht 5' 9 (1.753 m)   Wt 272 lb 6.4 oz (123.6 kg)   SpO2 99%   BMI 40.23 kg/m   BP Readings from Last 3 Encounters:  09/21/24 136/80  08/02/24 (!) 152/74  07/31/24 123/80    Wt Readings from Last 3 Encounters:  09/21/24 272 lb 6.4 oz (123.6 kg)  08/06/24 267 lb (121.1 kg)  08/02/24 268 lb (121.6 kg)    Physical Exam Musculoskeletal:     Right hip: Normal. No tenderness or bony tenderness. Normal range of motion.     Left hip: Normal. No tenderness or bony tenderness. Normal range of motion.     Right upper leg: Normal. No swelling, edema, deformity, lacerations, tenderness or bony tenderness.     Left upper leg: Normal.     Right knee: Deformity (DJD) present. No swelling, bony tenderness or crepitus. Normal range of motion. No tenderness.     Left knee: Deformity (DJD) present.     Right foot: Normal range of motion. No swelling, deformity, laceration, tenderness, bony tenderness or crepitus.     Left foot: Normal range of motion. No swelling, deformity, laceration, tenderness, bony tenderness or crepitus.     Comments: ++ antalgic gate     Lab Results  Component Value Date   WBC 5.9 09/12/2024   HGB 11.9 09/12/2024   HCT 37.3 09/12/2024   PLT 240 09/12/2024   GLUCOSE 96 09/12/2024   CHOL 185 12/20/2023   TRIG 108.0 12/20/2023   HDL 35.60 (L) 12/20/2023   LDLCALC 128 (H) 12/20/2023   ALT 13 09/12/2024    AST 20 09/12/2024   NA 140 09/12/2024   K 4.5 09/12/2024   CL 105 09/12/2024   CREATININE 1.23 (H) 09/12/2024   BUN 27 (H) 09/12/2024   CO2 22 09/12/2024   TSH 2.01 12/20/2023   INR 3.1 04/26/2013   HGBA1C 5.5 09/12/2024    Estimated Creatinine Clearance: 61.6 mL/min (A) (by C-G formula based on SCr of 1.23 mg/dL (H)).   DG Tibia/Fibula Right Result Date: 09/21/2024 EXAM: 4 VIEW(S) XRAY OF THE RIGHT TIBIA AND FIBULA 09/21/2024 10:19:46 AM COMPARISON: Right knee series 01/15/2019. CLINICAL HISTORY: 69 year old female. Status post fall down stairs. Pain after a fall. FINDINGS: BONES AND JOINTS: No acute fracture. No malalignment. Bidirectional calcaneal spurs.  SOFT TISSUES: Subcutaneous venous varicosities. Vascular calcifications. IMPRESSION: 1. No acute fracture or dislocation identified about the right tibia and fibula. Electronically signed by: Helayne Hurst MD 09/21/2024 10:39 AM EST RP Workstation: [id number]   DG Foot Complete Right Result Date: 09/21/2024 EXAM: 3 VIEW(S) XRAY OF THE RIGHT FOOT 09/21/2024 10:19:46 AM COMPARISON: Right foot series 11/24/2021. CLINICAL HISTORY: 69 year old female, 5 months, status post fall down stairs, foot pain. FINDINGS: BONES AND JOINTS: No acute fracture. No malalignment. Mild hallux valgus deformity. Mild degenerative changes most pronounced at the first MTP with joint space narrowing and subchondral sclerosis. Chronic Calcaneus degenerative osteophytosis. Small chronic accessory ossicle adjacent to the cuboid. SOFT TISSUES: Atherosclerotic vascular calcifications. IMPRESSION: 1. No acute fracture or dislocation identified about the right foot. Electronically signed by: Helayne Hurst MD 09/21/2024 10:38 AM EST RP Workstation: [id number]   DG FEMUR, MIN 2 VIEWS RIGHT Result Date: 09/21/2024 EXAM: 4 VIEW(S) XRAY OF THE RIGHT FEMUR 09/21/2024 10:19:46 AM COMPARISON: Right knee series 01/15/2019. CLINICAL HISTORY: 69 year old female. Pain after a fall down  stairs. FINDINGS: BONES AND JOINTS: No acute fracture. No malalignment. Incidental numerous calcified pelvic phleboliths. SOFT TISSUES: Unremarkable. IMPRESSION: 1. No acute fracture or dislocation identified about the right femur. Electronically signed by: Helayne Hurst MD 09/21/2024 10:36 AM EST RP Workstation: [id number]     Assessment & Plan:  Fall, initial encounter -     DG FEMUR, MIN 2 VIEWS RIGHT; Future -     DG Tibia/Fibula Right; Future -     DG Foot Complete Right; Future  Pain of right hip -     DG FEMUR, MIN 2 VIEWS RIGHT; Future  Pain of right lower leg -     DG Tibia/Fibula Right; Future  Right foot pain -     DG Foot Complete Right; Future  Need for immunization against influenza -     Flu vaccine HIGH DOSE PF(Fluzone Trivalent)  Essential hypertension- BP is well controlled.  B12 deficiency -     Cyanocobalamin      Follow-up: Return in about 3 months (around 12/20/2024).  Debby Molt, MD "

## 2024-09-21 NOTE — Patient Instructions (Signed)

## 2024-09-29 ENCOUNTER — Other Ambulatory Visit: Payer: Self-pay | Admitting: Hematology and Oncology

## 2024-09-29 ENCOUNTER — Other Ambulatory Visit: Payer: Self-pay | Admitting: Physician Assistant

## 2024-09-29 DIAGNOSIS — M0579 Rheumatoid arthritis with rheumatoid factor of multiple sites without organ or systems involvement: Secondary | ICD-10-CM

## 2024-09-29 DIAGNOSIS — C50312 Malignant neoplasm of lower-inner quadrant of left female breast: Secondary | ICD-10-CM

## 2024-10-03 ENCOUNTER — Other Ambulatory Visit: Payer: Self-pay | Admitting: Physician Assistant

## 2024-10-03 NOTE — Telephone Encounter (Signed)
 Last Fill: 09/10/2024  Labs: 09/12/2024 CBC stable.    Hgb A1c 5.5%    Creatinine is elevated-1.23 and GFR is low-48--avoid the use of NSAIDs.  Please clarify if she has had any medication changes? Any NSAID use? Dehydration?   Next Visit: 11/28/2024  Last Visit: 06/25/2024  DX: Rheumatoid arthritis with rheumatoid factor of multiple sites without organ or systems involvement (HCC)   Current Dose per office note 06/25/2024: Arava  20mg  by mouth daily   Okay to refill Arava  ?

## 2024-11-05 ENCOUNTER — Ambulatory Visit: Attending: Surgery

## 2024-11-28 ENCOUNTER — Ambulatory Visit: Admitting: Rheumatology

## 2024-12-19 ENCOUNTER — Ambulatory Visit: Admitting: Hematology and Oncology

## 2025-01-29 ENCOUNTER — Ambulatory Visit: Admitting: Neurology

## 2025-02-04 ENCOUNTER — Ambulatory Visit
